# Patient Record
Sex: Female | Born: 1960
Health system: Southern US, Community
[De-identification: ages and names within clinical notes are randomized; demographics above are authoritative.]

## PROBLEM LIST (undated history)

## (undated) DIAGNOSIS — R12 Heartburn: Secondary | ICD-10-CM

## (undated) DIAGNOSIS — G43909 Migraine, unspecified, not intractable, without status migrainosus: Secondary | ICD-10-CM

## (undated) DIAGNOSIS — K8020 Calculus of gallbladder without cholecystitis without obstruction: Secondary | ICD-10-CM

## (undated) DIAGNOSIS — M7989 Other specified soft tissue disorders: Secondary | ICD-10-CM

## (undated) DIAGNOSIS — D649 Anemia, unspecified: Secondary | ICD-10-CM

## (undated) DIAGNOSIS — M1990 Unspecified osteoarthritis, unspecified site: Secondary | ICD-10-CM

## (undated) DIAGNOSIS — G4730 Sleep apnea, unspecified: Secondary | ICD-10-CM

## (undated) DIAGNOSIS — M25562 Pain in left knee: Secondary | ICD-10-CM

## (undated) DIAGNOSIS — M2550 Pain in unspecified joint: Secondary | ICD-10-CM

## (undated) DIAGNOSIS — J40 Bronchitis, not specified as acute or chronic: Secondary | ICD-10-CM

## (undated) DIAGNOSIS — R0602 Shortness of breath: Secondary | ICD-10-CM

## (undated) DIAGNOSIS — D219 Benign neoplasm of connective and other soft tissue, unspecified: Secondary | ICD-10-CM

## (undated) DIAGNOSIS — K760 Fatty (change of) liver, not elsewhere classified: Secondary | ICD-10-CM

## (undated) HISTORY — DX: Sleep apnea, unspecified: G47.30

## (undated) HISTORY — DX: Calculus of gallbladder without cholecystitis without obstruction: K80.20

## (undated) HISTORY — DX: Pain in unspecified joint: M25.50

## (undated) HISTORY — DX: Fatty (change of) liver, not elsewhere classified: K76.0

## (undated) HISTORY — PX: ABDOMINAL HYSTERECTOMY: SHX81

## (undated) HISTORY — DX: Unspecified osteoarthritis, unspecified site: M19.90

## (undated) HISTORY — PX: WISDOM TOOTH EXTRACTION: SHX21

## (undated) HISTORY — DX: Heartburn: R12

## (undated) HISTORY — DX: Other specified soft tissue disorders: M79.89

## (undated) HISTORY — DX: Pain in left knee: M25.562

## (undated) HISTORY — DX: Shortness of breath: R06.02

---

## 1993-09-09 HISTORY — PX: TUBAL LIGATION: SHX77

## 2006-01-22 ENCOUNTER — Ambulatory Visit: Payer: Self-pay | Admitting: Internal Medicine

## 2006-01-30 ENCOUNTER — Ambulatory Visit: Payer: Self-pay | Admitting: Internal Medicine

## 2006-06-14 ENCOUNTER — Ambulatory Visit: Payer: Self-pay | Admitting: Internal Medicine

## 2008-11-24 ENCOUNTER — Ambulatory Visit: Payer: Self-pay

## 2009-11-28 ENCOUNTER — Ambulatory Visit: Payer: Self-pay

## 2010-12-26 ENCOUNTER — Ambulatory Visit: Payer: Self-pay

## 2011-08-25 ENCOUNTER — Emergency Department (HOSPITAL_COMMUNITY)
Admission: EM | Admit: 2011-08-25 | Discharge: 2011-08-25 | Disposition: A | Discharge status: Home / Self Care | Payer: Managed Care, Other (non HMO) | Attending: Emergency Medicine | Admitting: Emergency Medicine

## 2011-08-25 ENCOUNTER — Emergency Department (HOSPITAL_COMMUNITY): Payer: Managed Care, Other (non HMO)

## 2011-08-25 DIAGNOSIS — R079 Chest pain, unspecified: Secondary | ICD-10-CM | POA: Insufficient documentation

## 2011-08-25 DIAGNOSIS — R0602 Shortness of breath: Secondary | ICD-10-CM | POA: Insufficient documentation

## 2011-08-25 DIAGNOSIS — R109 Unspecified abdominal pain: Secondary | ICD-10-CM | POA: Insufficient documentation

## 2011-08-25 DIAGNOSIS — K8020 Calculus of gallbladder without cholecystitis without obstruction: Secondary | ICD-10-CM | POA: Insufficient documentation

## 2011-08-25 LAB — CK TOTAL AND CKMB (NOT AT ARMC)
CK, MB: 2.6 ng/mL (ref 0.3–4.0)
Total CK: 138 U/L (ref 7–177)

## 2011-08-25 LAB — CBC
MCH: 30.4 pg (ref 26.0–34.0)
MCV: 88.8 fL (ref 78.0–100.0)
Platelets: 265 10*3/uL (ref 150–400)
RDW: 13 % (ref 11.5–15.5)

## 2011-08-25 LAB — COMPREHENSIVE METABOLIC PANEL
AST: 43 U/L — ABNORMAL HIGH (ref 0–37)
Albumin: 3.4 g/dL — ABNORMAL LOW (ref 3.5–5.2)
Calcium: 9.6 mg/dL (ref 8.4–10.5)
Creatinine, Ser: 0.57 mg/dL (ref 0.50–1.10)
GFR calc non Af Amer: >60 mL/min (ref >60)

## 2011-08-25 LAB — DIFFERENTIAL
Eosinophils Absolute: 0.1 10*3/uL (ref 0.0–0.7)
Eosinophils Relative: 2 % (ref 0–5)
Lymphs Abs: 1.7 10*3/uL (ref 0.7–4.0)
Monocytes Relative: 8 % (ref 3–12)

## 2011-08-25 LAB — TROPONIN I: Troponin I: <0.3 ng/mL (ref <0.30)

## 2011-08-30 ENCOUNTER — Ambulatory Visit (INDEPENDENT_AMBULATORY_CARE_PROVIDER_SITE_OTHER): Payer: Managed Care, Other (non HMO) | Admitting: Surgery

## 2011-09-06 ENCOUNTER — Emergency Department (HOSPITAL_COMMUNITY): Payer: Managed Care, Other (non HMO)

## 2011-09-06 ENCOUNTER — Emergency Department (HOSPITAL_COMMUNITY)
Admission: EM | Admit: 2011-09-06 | Discharge: 2011-09-07 | Disposition: A | Discharge status: Home / Self Care | Payer: Managed Care, Other (non HMO) | Attending: Emergency Medicine | Admitting: Emergency Medicine

## 2011-09-06 ENCOUNTER — Telehealth (INDEPENDENT_AMBULATORY_CARE_PROVIDER_SITE_OTHER): Payer: Self-pay

## 2011-09-06 DIAGNOSIS — R079 Chest pain, unspecified: Secondary | ICD-10-CM | POA: Insufficient documentation

## 2011-09-06 DIAGNOSIS — R112 Nausea with vomiting, unspecified: Secondary | ICD-10-CM | POA: Insufficient documentation

## 2011-09-06 DIAGNOSIS — R1011 Right upper quadrant pain: Secondary | ICD-10-CM | POA: Insufficient documentation

## 2011-09-06 DIAGNOSIS — K8020 Calculus of gallbladder without cholecystitis without obstruction: Secondary | ICD-10-CM | POA: Insufficient documentation

## 2011-09-06 LAB — POCT I-STAT TROPONIN I: Troponin i, poc: 0 ng/mL (ref 0.00–0.08)

## 2011-09-06 LAB — URINALYSIS, ROUTINE W REFLEX MICROSCOPIC
Nitrite: NEGATIVE
Specific Gravity, Urine: 1.021 (ref 1.005–1.030)
Urobilinogen, UA: 0.2 mg/dL (ref 0.0–1.0)
pH: 8 (ref 5.0–8.0)

## 2011-09-06 LAB — CBC
HCT: 36.7 % (ref 36.0–46.0)
MCH: 29.7 pg (ref 26.0–34.0)
MCHC: 34.3 g/dL (ref 30.0–36.0)
RDW: 12.4 % (ref 11.5–15.5)

## 2011-09-06 LAB — COMPREHENSIVE METABOLIC PANEL
Albumin: 3.9 g/dL (ref 3.5–5.2)
BUN: 12 mg/dL (ref 6–23)
Calcium: 10 mg/dL (ref 8.4–10.5)
Creatinine, Ser: 0.66 mg/dL (ref 0.50–1.10)
Potassium: 3.8 mEq/L (ref 3.5–5.1)
Total Protein: 7.5 g/dL (ref 6.0–8.3)

## 2011-09-06 LAB — LIPASE, BLOOD: Lipase: 20 U/L (ref 11–59)

## 2011-09-06 LAB — DIFFERENTIAL
Basophils Absolute: 0 10*3/uL (ref 0.0–0.1)
Eosinophils Relative: 1 % (ref 0–5)
Lymphocytes Relative: 29 % (ref 12–46)
Monocytes Absolute: 0.4 10*3/uL (ref 0.1–1.0)

## 2011-09-06 LAB — URINE MICROSCOPIC-ADD ON

## 2011-09-06 NOTE — Telephone Encounter (Signed)
Patient called and states she has an appointment 10/8 with Dr Derrell Lolling for her gallbladder.  She is now having chest pain.  She has some nausea, no shortness of breath, no abd pain, no vomiting and no fever.  She states it's the same pain she had before when she went to urgent care and then to the [facility] ER.  They gave her a thorough work up and ruled out anything cardiac and told her she has gallstones.  The pain today is not as bad.  She has not taken her pain med yet.  I advised she should go to the ER because it is probably another gb attack but just to be sure.  She will try the pain med and if no relief, go to ER.

## 2011-09-10 HISTORY — PX: CHOLECYSTECTOMY: SHX55

## 2011-09-11 ENCOUNTER — Encounter (INDEPENDENT_AMBULATORY_CARE_PROVIDER_SITE_OTHER): Payer: Self-pay | Admitting: General Surgery

## 2011-09-11 ENCOUNTER — Other Ambulatory Visit (INDEPENDENT_AMBULATORY_CARE_PROVIDER_SITE_OTHER): Payer: Self-pay | Admitting: General Surgery

## 2011-09-11 ENCOUNTER — Inpatient Hospital Stay (HOSPITAL_COMMUNITY): Payer: Managed Care, Other (non HMO)

## 2011-09-11 ENCOUNTER — Inpatient Hospital Stay (HOSPITAL_COMMUNITY)
Admission: AD | Admit: 2011-09-11 | Discharge: 2011-09-12 | DRG: 419 | Disposition: A | Discharge status: Home / Self Care | Payer: Managed Care, Other (non HMO) | Source: Ambulatory Visit | Attending: General Surgery | Admitting: General Surgery

## 2011-09-11 ENCOUNTER — Ambulatory Visit (INDEPENDENT_AMBULATORY_CARE_PROVIDER_SITE_OTHER): Payer: Managed Care, Other (non HMO) | Admitting: General Surgery

## 2011-09-11 VITALS — BP 98/68 | HR 120 | Temp 98.8°F | Resp 18 | Ht 65.5 in | Wt 165.4 lb

## 2011-09-11 DIAGNOSIS — M1710 Unilateral primary osteoarthritis, unspecified knee: Secondary | ICD-10-CM | POA: Diagnosis present

## 2011-09-11 DIAGNOSIS — K810 Acute cholecystitis: Secondary | ICD-10-CM

## 2011-09-11 DIAGNOSIS — K219 Gastro-esophageal reflux disease without esophagitis: Secondary | ICD-10-CM | POA: Diagnosis present

## 2011-09-11 DIAGNOSIS — Z880 Allergy status to penicillin: Secondary | ICD-10-CM

## 2011-09-11 DIAGNOSIS — K8000 Calculus of gallbladder with acute cholecystitis without obstruction: Secondary | ICD-10-CM

## 2011-09-11 DIAGNOSIS — Z87891 Personal history of nicotine dependence: Secondary | ICD-10-CM

## 2011-09-11 LAB — COMPREHENSIVE METABOLIC PANEL
Albumin: 3.2 g/dL — ABNORMAL LOW (ref 3.5–5.2)
Alkaline Phosphatase: 193 U/L — ABNORMAL HIGH (ref 39–117)
BUN: 12 mg/dL (ref 6–23)
Chloride: 92 mEq/L — ABNORMAL LOW (ref 96–112)
Creatinine, Ser: 1.06 mg/dL (ref 0.50–1.10)
GFR calc Af Amer: 69 mL/min — ABNORMAL LOW (ref >90)
Glucose, Bld: 128 mg/dL — ABNORMAL HIGH (ref 70–99)
Total Bilirubin: 0.7 mg/dL (ref 0.3–1.2)
Total Protein: 8.2 g/dL (ref 6.0–8.3)

## 2011-09-11 LAB — CBC
HCT: 37.4 % (ref 36.0–46.0)
Hemoglobin: 12.9 g/dL (ref 12.0–15.0)
MCHC: 34.5 g/dL (ref 30.0–36.0)
MCV: 88.2 fL (ref 78.0–100.0)
RDW: 12.4 % (ref 11.5–15.5)

## 2011-09-11 NOTE — Progress Notes (Signed)
Chief Complaint  Patient presents with  . Other    seen in ER for gallstones; in a lot of pain     HPI Lusero Nordlund is a 50 y.o. female.  Referred by Dr. Earlie Lou  HPI This is a 50 year old female who about a month ago began developing some right upper quadrant and right back pain. She was evaluated urgent care at that point I was sent home with a diagnosis of gallstones this has also been associated with some chest pain. This has been associated with some nausea but no emesis. She was scheduled in office but had her appointment canceled previously was due to come and again next week. She presented back to the emergency room last week where she still had gallstones but her she will he was pain-free per the emergency room and so she went home and I had her followup sooner than next week. Today she comes in really with constant pain for the last 72 hours. She is having sweats. She has not really been at this point at all. Nothing she is doing is relieving his pain at this point. This is aggravated by movement and even bleeding now.  Past Medical History  Diagnosis Date  . Gallstones   . Arthritis     Past Surgical History  Procedure Date  . Abdominal hysterectomy     partial   . Tubal ligation   . Boil removed      Family History  Problem Relation Age of Onset  . Diabetes Father     coma     Social History History  Substance Use Topics  . Smoking status: Former Games developer  . Smokeless tobacco: Never Used  . Alcohol Use: Yes    Allergies  Allergen Reactions  . Penicillins     Hives     Current Outpatient Prescriptions  Medication Sig Dispense Refill  . HYDROcodone-acetaminophen (VICODIN) 5-500 MG per tablet       . ondansetron (ZOFRAN-ODT) 8 MG disintegrating tablet       . oxyCODONE-acetaminophen (PERCOCET) 5-325 MG per tablet       . promethazine (PHENERGAN) 25 MG tablet         Review of Systems Review of Systems  Constitutional: Positive for diaphoresis and  appetite change.  HENT: Negative.   Eyes: Negative.   Respiratory: Negative.   Cardiovascular: Negative.   Gastrointestinal: Positive for abdominal pain.  Genitourinary: Negative.   Musculoskeletal: Negative.   Neurological: Negative.   Hematological: Negative.   Psychiatric/Behavioral: Negative.     Blood pressure 98/68, pulse 120, temperature 98.8 F (37.1 C), resp. rate 18, height 5' 5.5" (1.664 m), weight 165 lb 6 oz (75.014 kg).  Physical Exam Physical Exam  Constitutional: She appears well-developed and well-nourished.  Eyes: No scleral icterus.  Neck: Neck supple.  Cardiovascular: Regular rhythm and normal heart sounds.  Tachycardia present.   Pulmonary/Chest: Effort normal and breath sounds normal. She has no wheezes. She has no rales.  Abdominal: Soft. Bowel sounds are normal. There is no hepatomegaly. There is tenderness in the right upper quadrant. There is positive Murphy's sign. No hernia.  Lymphadenopathy:    She has no cervical adenopathy.  Skin: She is diaphoretic.    Data Reviewed *RADIOLOGY REPORT*  Clinical Data: Right upper quadrant pain and history of gallstones  ABDOMINAL ULTRASOUND COMPLETE  Comparison: Abdominal ultrasound 08/25/2011  Findings:  Gallbladder: There is a 2.4 cm stone in the gallbladder neck. The  time of exam this stone  was immobile with changes in patient  position. There is a 4 mm echogenicity in the dependent portion of  the gallbladder that could be tiny poly or nonshadowing stone. The  gallbladder wall thickness is normal. The sonographic Murphy's  sign is negative.  Common Bile Duct: Measures 5.8 to 6.2 mm.  Liver: No focal mass lesion identified. Within normal limits in  parenchymal echogenicity.  IVC: Appears normal.  Pancreas: Although the pancreas is difficult to visualize in its  entirety, no focal pancreatic abnormality is identified.  Spleen: Within normal limits in size and echotexture.  Right kidney: Normal in size  and parenchymal echogenicity. No  evidence of mass or hydronephrosis.  Left kidney: Normal in size and parenchymal echogenicity. No  evidence of mass or hydronephrosis.  Abdominal Aorta: No aneurysm identified.  IMPRESSION:  1.Large gallstone in the gallbladder neck. The stone was immobile  during examination.  2. Negative for acute cholecystitis.  3. The common bile duct measures up to 6.2 mm, slightly prominent.  On the recent prior study it measured smaller (5 mm).  Original Report Authenticated By: Britta Mccreedy, M.D.   Assessment    Cholecystitis    Plan    She pretty clearly on her exam today has acute cholecystitis. She is tachycardic today. She is also having sweats. I think she needs to be admitted, placed on IV antibiotics, and and have a cholecystectomy this admission. We discussed laparoscopic possible open cholecystectomy and some of the risks associated with that. I am going to admit her to [facility] per her choice and I've discussed this with my partner Dr. Carolynne Edouard already. He will care for while she is in the hospital. We will plan on rechecking her labs prior to the operating room as well.       Ajax Schroll 09/11/2011, 10:55 AM

## 2011-09-11 NOTE — Patient Instructions (Signed)
Admit to hospital for acute cholecystitis

## 2011-09-12 LAB — COMPREHENSIVE METABOLIC PANEL
Alkaline Phosphatase: 150 U/L — ABNORMAL HIGH (ref 39–117)
BUN: 9 mg/dL (ref 6–23)
CO2: 29 mEq/L (ref 19–32)
Chloride: 100 mEq/L (ref 96–112)
GFR calc non Af Amer: >90 mL/min (ref >90)
Glucose, Bld: 170 mg/dL — ABNORMAL HIGH (ref 70–99)
Potassium: 3.9 mEq/L (ref 3.5–5.1)
Total Bilirubin: 0.5 mg/dL (ref 0.3–1.2)

## 2011-09-12 LAB — CBC
HCT: 31.7 % — ABNORMAL LOW (ref 36.0–46.0)
Hemoglobin: 10.5 g/dL — ABNORMAL LOW (ref 12.0–15.0)
MCV: 88.8 fL (ref 78.0–100.0)
Platelets: 251 10*3/uL (ref 150–400)
RBC: 3.57 MIL/uL — ABNORMAL LOW (ref 3.87–5.11)
WBC: 11.4 10*3/uL — ABNORMAL HIGH (ref 4.0–10.5)

## 2011-09-12 NOTE — H&P (Signed)
[name], WESTRA NO.:  [id number]  MEDICAL RECORD NO.:  [id number]  LOCATION:  1524                         FACILITY:  [facility]  PHYSICIAN:  Ollen Gross. Vernell Morgans, M.D. DATE OF BIRTH:  May 22, 1961  DATE OF ADMISSION:  09/11/2011 DATE OF DISCHARGE:                             HISTORY & PHYSICAL   PRIMARY CARE:  Dr. Romana Juniper.  REASON FOR ADMISSION:  Gallstones with chest, abdominal, and right upper quadrant pain.  BRIEF HISTORY:  The patient is a 50 year old African-American female who has been relatively in good health, developed pain, and was seen by an Urgent Care on August 25, 2011.  They transferred her to the emergency room at [facility], where workup showed her to have gallstone along with some sludge.  Ultrasound showed a 2.4-cm stone and a small amount of sludge within the gallbladder.  Gallbladder was minimally thickened at 4 mm.  There was no pericholecystic fluid and there was no definite Murphy sign.  Common bile duct was 5 mm.  The other structures appeared normal.  Labs at that time were also obtained.  Her white count was 4.7, hemoglobin 11.7, hematocrit was 34.  CMP showed normal renal function, creatinine of 0.57.  Electrolytes were normal except for the potassium of 3.4.   Bilirubin was 0.3; alkaline phosphatase 97; SGOT was 43; SGPT was 39, slightly elevated.  Lipase on 15th was 23.  They also did a CK and troponin which were negative.  The patient returned to the ER on September 27 again and troponin was negative.  White count was 6.6, hemoglobin 12.6, hematocrit 36.7.  There was no left shift.  Creatinine was 0.66, potassium was 3.8, total bilirubin 0.2, alkaline phosphatase 85, SGOT was 22, SGPT was 17.  Lipase was 20.  Urinalysis was essentially negative.  There were 0 to 2 white cells per high-powered field.  Troponin was also obtained and this was 0.01, negative again.  I do not see any EKG.  Chest x-ray on her initial ER visit was  normal. She is continued to have pain.  She was initially scheduled to see someone in our office on September 24 and that had to be cancelled.  She had a second appointment scheduled for September 17, 2011, with Dr. Derrell Lolling, but she was bumped up today because she is having continuous pain, continuous nausea and vomiting.  She will get up, have something to eat, take a pain pill then go back to bed.  She was seen in the office by Dr. Dwain Sarna who subsequently admitted her directly to the hospital for evaluation and treatment by Dr. Carolynne Edouard.  PAST MEDICAL HISTORY: 1. Gallstones. 2. Arthritis. 3. History of migraines, none for a year. 4. History of bronchitis, none for 2 years. 5. GERD. 6. She has arthritis in both knees.  She needs a right knee     replacement at this time.  PAST SURGICAL HISTORY: 1. Hysterectomy. 2. Tubal ligation. 3. She had a perirectal abscess.  FAMILY HISTORY:  Mother is living, has diabetes and peripheral vascular occlusive disease.  Father died with diabetes.  Brother is deceased with renal failure.  There is one living who also has  diabetes.  SOCIAL HISTORY:  She smoked for about 23 years on and off, she quit 2 years ago, usually 1/2  a pack a day.  Alcohol social, none for 2 years. Drugs none.  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  REVIEW OF SYSTEMS:  She has not had any fevers.  She did have chills and sweats today.  She is having ongoing pain, especially after eating. Positive for nausea and vomiting.  Positive for GERD.  No diarrhea or constipation, although she reports no BM for 2 to 3 days.  CARDIAC: Negative for chest pain or palpitations.  She does have chest discomfort when she breathes or moves, everything hurts.  GU: No trouble voiding. LOWER EXTREMITIES:  No edema.  No claudication.  She has chronic knee pain.  SKIN:  No changes.  PSYCH:  No changes.  Appetite has been diminished, she has not been  eating much for the last 2 weeks secondary to her discomfort, nausea, and vomiting.  CURRENT MEDICATIONS:  Include; 1. Hydrocodone. 2. Percocet. 3. Zofran. 4. Phenergan.  ALLERGIES:  PENICILLIN causes rash.  PHYSICAL EXAMINATION:  GENERAL:  This is a well-nourished, overweight African-American female in no acute distress. VITAL SIGNS:  Temperature is 98.9, heart rate is 110, blood pressure is 114/79, respiratory rate is 20, sats are 100% on room air. HEAD:  Normocephalic. EYES, EARS, NOSE, AND THROAT:  Within normal limits.  There are no icteric changes in the eyes. NECK:  Trachea is in the midline.  There are no bruits.  No JVD.  No thyromegaly. RESPIRATORY:  Effort is normal. CHEST:  Clear to auscultation and percussion. CARDIAC:  Normal S1 and S2.  No murmurs or rubs were heard.  She does note that it hurts to breath in deeply. ABDOMEN:  Bowel sounds are present and normal.  Abdomen is not distended.  She is tender.  She points from the midepigastric area below the xiphoid to the right upper quadrant and into her back.  There are no masses, hernia, or abscesses noted. GU/RECTAL:  Deferred. LYMPH:  Lymphadenopathy, none palpated at cervical or femoral areas. MUSCULOSKELETAL:  No changes noted. SKIN:  No changes noted. NEUROLOGIC: No focal deficits.  Cranial nerves II-XII grossly intact. PSYCH:  Normal affect.  LABORATORY DATA:  Labs from September 07, 2011, are listed above. Ultrasound on September 15 shows a 2.4-cm stone with sludge in the gallbladder.  Common bile duct was 5 mm.  The gallbladder wall was 4 mm with no pericholecystic fluid and no Murphy sign.  Repeat ultrasound on September 27 shows 2.4 cm stone, is unchanged from her last report. There is also a 4-mm stone/sludge in the dependent portion of the gallbladder.  No Murphy sign.  The common bile duct is increased between 5.8 to 6.2 mm.  Chest x-ray on August 25, 2011, was normal.  IMPRESSION: 1.  Cholecystitis. 2. History of arthritis.  She needs bilateral knee replacements. 3. History of migraines, none for 1 year. 4. History of tobacco use, none for 2 years. 5. History of gastroesophageal reflux disease.  PLAN:  The patient has been seen by Dr. Carolynne Edouard.  She has antibiotics ordered, we will start her on those.  She had some yogurt at 7 a.m., some sips of water around 9 a.m.  We are going to wait around 2:30 or 3 and plan to do elective cholecystectomy today.  The risks and benefits were discussed with the patient and she was in  agreement.     Eber Hong, P.A.   ______________________________ Ollen Gross. Vernell Morgans, M.D.    WDJ/MEDQ  D:  09/11/2011  T:  09/11/2011  Job:  [id number]  cc:   Dr. Romana Juniper  Electronically Signed by Sherrie George P.A. on 09/11/2011 04:54:09 PM Electronically Signed by Chevis Pretty III M.D. on 09/12/2011 01:00:49 PM

## 2011-09-12 NOTE — Op Note (Signed)
NAMEPRESLEA, RHODUS NO.:  [id number]  MEDICAL RECORD NO.:  [id number]  LOCATION:  1524                         FACILITY:  [facility]  PHYSICIAN:  Ollen Gross. Vernell Morgans, M.D. DATE OF BIRTH:  10-13-1961  DATE OF PROCEDURE:  09/11/2011 DATE OF DISCHARGE:                              OPERATIVE REPORT   PREOPERATIVE DIAGNOSIS:  Cholecystitis with cholelithiasis.  POSTOPERATIVE DIAGNOSIS:  Cholecystitis with cholelithiasis.  PROCEDURE:  Laparoscopic cholecystectomy with intraoperative cholangiogram.  SURGEON:  Ollen Gross. Vernell Morgans, M.D.  ASSISTANT:  Sandria Bales. Ezzard Standing, M.D.  ANESTHESIA:  General endotracheal.  PROCEDURE:  After informed consent was obtained, the patient was brought to the operating room, placed in supine position on the operating room table.  After adequate induction of general anesthesia, patient's abdomen was prepped with ChloraPrep, allowed to dry and draped in usual sterile manner.  The area below the umbilicus was infiltrated with 0.25% Marcaine.  A small incision was made with a 15 blade knife.  This incision was carried down through the subcutaneous tissue bluntly with a hemostat and Army-Navy retractors until linea alba was identified.  The linea alba was incised with a 15 blade knife and each side was grasped with Kocher clamps and elevated anteriorly.  The preperitoneal space was then probed bluntly, hemostat to the peritoneum was opened, and access was gained to the abdominal cavity.  A 0 Vicryl pursestring stitch was placed in the fascia around the opening.  A Hasson cannula was placed through the opening and anchored in place the precise 5-0 Vicryl purse- string stitch.  The abdomen was then insufflated with carbon dioxide without difficulty.  The patient was placed in reverse Trendelenburg position and rotated with the right side up.  The epigastric region was then infiltrated with 0.25% Marcaine.  A small incision was made with a 15 blade  knife and a 10 mm port was placed bluntly through this incision into the abdominal cavity under direct vision.  Sites were then chosen laterally on the right side of the abdomen for placement of two 5 mm ports.  Each of these areas were infiltrated with 0.25% Marcaine.  Small stab incisions were made with a #15 blade knife.  5 mm ports were placed bluntly through these incisions into the abdominal cavity under direct vision.  The gallbladder was identified, it was very large, thick- walled, and inflamed.  We were able to bluntly dissect the omentum and duodenum away from the gallbladder.  In order to grasp the bladder, we placed Nezhat suction irrigator through one of the 5 mm ports and punctured the dome of the gallbladder and we were able to aspirate its contents.  We then placed a blunt grasper through the lateral-most 5 mm port and used to grasp the dome of the gallbladder and elevated anteriorly and superiorly.  Another blunt grasper was placed through the other 5 mm port and used to retract on the body and neck of the gallbladder.  A dissector was then placed through the epigastric port, and using blunt dissection, we were able to bluntly dissect at the neck of the gallbladder and we were able to readily identify the gallbladder neck cystic  duct junction and create a good window.  A single clip was placed on the gallbladder neck.  A small ductotomy was made just below the clip with laparoscopic scissors.  A 14-gauge Angiocath was placed percutaneously through the anterior abdominal wall under direct vision. A Reddick cholangiogram catheter was placed through the Angiocath and flushed.  The Reddick catheter was then placed within the cystic duct and anchored in place with a clip.  Cholangiogram was obtained that showed no filling defects, good emptying in duodenum, and adequate length on the cystic duct.  The anchoring clip and catheters were then removed from the patient.  Three  clips were placed proximally on the cystic duct and duct was divided between the 2 sets of clips.  Posterior to this, the cystic artery was identified and again dissected bluntly in a circumferential manner until a good window was created.  Two clips were placed proximally and distally on the artery and the artery was divided between the two.  A laparoscopic hook cautery device was used to separate the gallbladder from the liver bed prior to completely detaching the gallbladder from the liver bed.  The liver bed was inspected and several bleeding points were coagulated with electrocautery until the area was completely hemostatic.  The gallbladder was then detached away from the liver bed without difficulty.  A laparoscopic bag was inserted through the epigastric port.  Gallbladder was placed in the bag and the bag was sealed.  The abdomen was then irrigated with copious amounts of saline until the effluent was clear.  The laparoscope was removed through the epigastric port.  A gallbladder grasper was placed through the Hasson cannula and used to grasp to open the bag.  The bag with the gallbladder was removed with the Hasson cannula through the infraumbilical port.  We did have to extend this incision superiorly little bit to get the gallbladder out because of how thick it was.  We closed the fascia at the infraumbilical port site with a couple of 0 Vicryl figure-of-8 stitches as well as the previously placed pursestring stitch.  The rest of the ports were then removed under direct vision and were found to be hemostatic.  The gas was allowed to escape.  Skin incisions were all closed with interrupted 4-0 Monocryl subcuticular stitches and Dermabond dressings were applied. Patient tolerated the procedure well.  At the end of the case, all needle, sponge, instrument counts were correct.  Patient was then awakened and taken to the Recovery in stable condition.     Ollen Gross. Vernell Morgans,  M.D.     PST/MEDQ  D:  09/11/2011  T:  09/12/2011  Job:  [id number]  Electronically Signed by Chevis Pretty III M.D. on 09/12/2011 01:00:54 PM

## 2011-09-17 ENCOUNTER — Ambulatory Visit (INDEPENDENT_AMBULATORY_CARE_PROVIDER_SITE_OTHER): Payer: Managed Care, Other (non HMO) | Admitting: General Surgery

## 2011-09-28 NOTE — Discharge Summary (Signed)
[name], [name] NO.:  [id number]  MEDICAL RECORD NO.:  [id number]  LOCATION:  1524                         FACILITY:  [facility]  PHYSICIAN:  Dr. Hortense Ramal              P.A.DATE OF BIRTH:  1961/10/16  DATE OF ADMISSION:  09/11/2011 DATE OF DISCHARGE:                              DISCHARGE SUMMARY   DATE OF EXPECTED DISCHARGE:  September 12, 2011.  PRIMARY CARE DOCTOR:  Dr. Shela Nevin.  ADMISSION DIAGNOSIS:  Acute cholecystitis with cholelithiasis.  DISCHARGE DIAGNOSIS:  Acute cholecystitis with cholelithiasis.  ADDITIONAL DIAGNOSES: 1. History of migraines. 2. History of bronchitis. 3. History of tobacco use. 4. Gastroesophageal reflux disease. 5. Osteoarthritis both knees, currently needs a right knee     replacement.  PROCEDURE:  Laparoscopic cholecystectomy with intraoperative cholangiogram on September 11, 2011, Dr. Carolynne Edouard.  BRIEF HISTORY:  The patient is a 50 year old African American female who was seen in the ER after evaluation at an Urgent Care on August 25, 2011.  She was found to have gallstones at that time, was scheduled for followup appointment in our office on September 03, 2011.  That appointment was cancelled.  She returned to the ER again on September 06, 2011, and September 07, 2011, with pain and was rescheduled to be seen in our office on September 17, 2011.  The patient has had continuous problems with nausea and vomiting and pain for the last 2 weeks.  She was seen in the office today by Dr. Dwain Sarna who subsequently admitted her at that time for cholecystectomy.  It was his opinion she had acute cholecystitis for 2 weeks.  For further history and physical, please see the dictated note.  MEDICATIONS ON ADMISSION:  She has prescriptions for hydrocodone, Percocet, Zofran, and Phenergan from her prior evaluations.  No other home meds.  ALLERGIES:  PENICILLIN causes rash.  HOSPITAL COURSE:  The patient was admitted directly from  the office on September 11, 2011.  Labs obtained showed a white count of 15,600, hemoglobin is 12.9, and hematocrit was 37.  CMP showed normal electrolytes, creatinine of 1.06.  LFTs were slightly elevated.  Total bilirubin 0.7, alk phos 193.  Her SGOT was 47, SGPT was 190.  Lipase on admission was 17.  The patient was taken to the OR that afternoon by Dr. Carolynne Edouard, and underwent laparoscopic cholecystectomy.  She tolerated well and returned to the floor in good condition.  Following a.m., she is on clear liquids.  Her lipase is 13.  Her electrolytes are normal.  BUN is 9, creatinine is 0.64.  Total bilirubin is 0.5, alk phos is 150, SGOT is 45, SGPT is 135.  Her white count is down to 11.4, hemoglobin 10.5, and hematocrit 31.7.  The patient feels much better.  We are going to advance her diet, mobilize her, make sure she does well on oral pain medicines, and if she continues to do well, plan to discharge her later in the afternoon on September 12, 2011.  We will have her followup in 2-3 weeks with Dr. Carolynne Edouard.  She is to maintain a low-fat diet.  She works at the  [employer] and does fairly physical work with [employer], worked for at least 2 weeks and she is not safe to drive or use any equipment while she is taking the oxycodone.  She is to clean her wounds with plain soap and water.  She has no sutures or Steri- Strips to remove.  DISCHARGE MEDICATIONS: 1. Tylenol 650 mg p.o. q.4 h. p.r.n. for pain or ibuprofen 200 mg 2-3     tablets q.6 p.r.n. for pain. 2. Finally, she may have oxycodone/APAP 5/325 one to two q.4 p.r.n.     for pain, not relieved by the above meds.  Phenergan, Zofran, and hydrocodone are discontinued.  She is to call for any problems including fever of 101 or greater, trouble voiding.  Continued bleeding from the incision, increased pain, redness, or drainage from the incision or increased abdominal pain.  She is also instructed to follow up with her  primary care in 3-4 weeks.  She had an elevated glucose this morning of 170, but she is on IV fluids. She has not had a problem in the past with elevated glucoses.  We will let her follow up with her primary.     Eber Hong, P.A.     WDJ/MEDQ  D:  09/12/2011  T:  09/12/2011  Job:  [id number]  cc:   Dr. Shela Nevin  Electronically Signed by Sherrie George P.A. on 09/19/2011 09:55:30 PM Electronically Signed by Chevis Pretty III M.D. on 09/28/2011 09:03:39 AM

## 2011-10-02 ENCOUNTER — Ambulatory Visit (INDEPENDENT_AMBULATORY_CARE_PROVIDER_SITE_OTHER): Payer: Managed Care, Other (non HMO) | Admitting: General Surgery

## 2011-10-02 ENCOUNTER — Encounter (INDEPENDENT_AMBULATORY_CARE_PROVIDER_SITE_OTHER): Payer: Self-pay | Admitting: General Surgery

## 2011-10-02 VITALS — BP 132/84 | HR 80 | Temp 98.4°F | Resp 12 | Ht 65.0 in | Wt 185.2 lb

## 2011-10-02 DIAGNOSIS — K810 Acute cholecystitis: Secondary | ICD-10-CM

## 2011-10-02 NOTE — Progress Notes (Signed)
Subjective:     Patient ID: [name], female   DOB: [birth year], 50 y.o.   MRN: [id number]  HPI The patient is a 50 year old white female who is now about 3 weeks out from a laparoscopic cholecystectomy. Her main complaint is of sensitivity at her incisions. She denies any fevers.  she denies any nausea or vomiting. Her appetite has been good and her bowels are working normally.  Review of Systems     Objective:   Physical Exam On exam her abdomen is soft and nontender. She is very sensitive at each of her incisions. There is no redness to suggest infection. Her incisions look like they're healing very nicely.    Assessment:     3 weeks status post laparoscopic cholecystectomy    Plan:     At this point I think she ought to refrain from lifting anything heavy for about another 3 weeks. We will plan to see her back at the end of that time to clear her for full activity. I recommended some massage of the incisions to try to desensitize them

## 2011-10-02 NOTE — Patient Instructions (Signed)
No heavy lifting Massage incisions to help desensitize

## 2011-11-05 ENCOUNTER — Ambulatory Visit (INDEPENDENT_AMBULATORY_CARE_PROVIDER_SITE_OTHER): Payer: Managed Care, Other (non HMO) | Admitting: General Surgery

## 2011-11-05 ENCOUNTER — Encounter (INDEPENDENT_AMBULATORY_CARE_PROVIDER_SITE_OTHER): Payer: Self-pay | Admitting: General Surgery

## 2011-11-05 VITALS — BP 122/88 | HR 76 | Temp 97.0°F | Resp 12 | Ht 65.0 in | Wt 190.2 lb

## 2011-11-05 DIAGNOSIS — K810 Acute cholecystitis: Secondary | ICD-10-CM

## 2011-11-05 NOTE — Progress Notes (Signed)
Subjective:     Patient ID: [name], female   DOB: November 30, 1961, 50 y.o.   MRN: [id number]  HPI The patient is a 50 year old black female who is now about 6 weeks out from a laparoscopic cholecystectomy. She is feeling much better. She has no complaints today. Her appetite is good and her bowels are working fairly normally. She occasionally has a loose bowel movement.  Review of Systems     Objective:   Physical Exam On exam her abdomen is soft and nontender. Her incisions are all healed up nicely. She has minimal discomfort mostly around the umbilicus. No palpable evidence for hernia.    Assessment:     6 weeks status post laparoscopic cholecystectomy    Plan:     At this point I believe she can return to her normal activities without any restrictions. We will plan to see her back on a p.r.n. basis.

## 2011-11-05 NOTE — Patient Instructions (Signed)
May return to all normal activities 

## 2012-04-24 ENCOUNTER — Ambulatory Visit: Payer: Managed Care, Other (non HMO)

## 2012-04-24 ENCOUNTER — Telehealth: Payer: Self-pay

## 2012-04-24 ENCOUNTER — Other Ambulatory Visit: Payer: Self-pay | Admitting: Family Medicine

## 2012-04-24 ENCOUNTER — Ambulatory Visit (INDEPENDENT_AMBULATORY_CARE_PROVIDER_SITE_OTHER): Payer: Managed Care, Other (non HMO) | Admitting: Family Medicine

## 2012-04-24 VITALS — BP 119/79 | HR 64 | Temp 98.0°F | Resp 18 | Ht 65.0 in | Wt 189.6 lb

## 2012-04-24 DIAGNOSIS — M25569 Pain in unspecified knee: Secondary | ICD-10-CM

## 2012-04-24 DIAGNOSIS — M1711 Unilateral primary osteoarthritis, right knee: Secondary | ICD-10-CM

## 2012-04-24 DIAGNOSIS — M25561 Pain in right knee: Secondary | ICD-10-CM

## 2012-04-24 MED ORDER — OXAPROZIN 600 MG PO TABS: 600.0000 mg | ORAL_TABLET | Freq: Two times a day (BID) | ORAL | Status: DC

## 2012-04-24 MED ORDER — HYDROCODONE-ACETAMINOPHEN 5-500 MG PO TABS: 1.0000 | ORAL_TABLET | Freq: Three times a day (TID) | ORAL | Status: DC | PRN

## 2012-04-24 NOTE — Progress Notes (Signed)
Subjective: 51 year old lady with a history of a bad right knee. She is at was told last summer that she needed to have a total knee done, but the orthopedist preferred to try and wait another 4 years associated her mid 38s before getting this done. She has been wearing her knee brace. She twisted her knee yesterday despite having the brace on. No major injury just twisted. She pops all the time and that knee. Last night it swelled up badly has been very painful. She went to work at 4 AM today and worked for a while but could not tolerate it.  Objective: Tightly swollen right knee with large effusion palpable. She has diffuse tenderness around the knee joint. Only minimally warm. Pain with any movement. She has her knee brace with her, which is what she coming in.  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 [facility] reading (PRIMARY) by  Dr. Alwyn Ren Medial narrowing and popliteal calcifications c/w djd .  Knee was aspirated using sterile technique. Bloody fluid was obtained, 30 mL function.  Due to discomfort did not draw off more.

## 2012-04-24 NOTE — Patient Instructions (Addendum)
Patient is to followup with Dr. Ranell Patrick in the near future.  Use crutches. Stay off the knee as much as possible and keep it elevated with ice on it for the next few days. If you do not have an appointment yet with Dr. Devonne Doughty, come back here at Tuesday for a recheck

## 2012-04-24 NOTE — Telephone Encounter (Signed)
Pt at pharmacy waiting to get rx of oxizprozon and pt copay is $80 she can not pay that can Dr please call in something cheaper, pt is waiting

## 2012-04-25 LAB — SYNOVIAL CELL COUNT + DIFF, W/ CRYSTALS
Eosinophils-Synovial: 0 % (ref 0–1)
Lymphocytes-Synovial Fld: 5 % (ref 0–20)
Monocyte/Macrophage: 6 % — ABNORMAL LOW (ref 50–90)
Neutrophil, Synovial: 89 % — ABNORMAL HIGH (ref 0–25)
WBC, Synovial: 5100 cu mm — ABNORMAL HIGH (ref 0–200)

## 2012-04-25 MED ORDER — NAPROXEN 500 MG PO TABS: 500.0000 mg | ORAL_TABLET | Freq: Two times a day (BID) | ORAL | Status: AC

## 2012-04-25 NOTE — Telephone Encounter (Signed)
Done

## 2012-04-28 LAB — BODY FLUID CULTURE: Gram Stain: NONE SEEN

## 2012-04-29 ENCOUNTER — Ambulatory Visit (INDEPENDENT_AMBULATORY_CARE_PROVIDER_SITE_OTHER): Payer: Managed Care, Other (non HMO) | Admitting: Family Medicine

## 2012-04-29 ENCOUNTER — Encounter: Payer: Self-pay | Admitting: Family Medicine

## 2012-04-29 VITALS — BP 139/87 | HR 64 | Temp 99.0°F | Resp 16 | Ht 65.0 in | Wt 193.0 lb

## 2012-04-29 DIAGNOSIS — M1711 Unilateral primary osteoarthritis, right knee: Secondary | ICD-10-CM | POA: Insufficient documentation

## 2012-04-29 DIAGNOSIS — M1990 Unspecified osteoarthritis, unspecified site: Secondary | ICD-10-CM

## 2012-04-29 DIAGNOSIS — M1710 Unilateral primary osteoarthritis, unspecified knee: Secondary | ICD-10-CM

## 2012-04-29 MED ORDER — HYDROCODONE-ACETAMINOPHEN 5-500 MG PO TABS: 1.0000 | ORAL_TABLET | Freq: Three times a day (TID) | ORAL | Status: AC | PRN

## 2012-04-29 MED ORDER — PREDNISONE 20 MG PO TABS: 40.0000 mg | ORAL_TABLET | Freq: Every day | ORAL | Status: AC

## 2012-04-29 NOTE — Progress Notes (Signed)
This is a 51 year old woman, patient of Dr. Clearance Coots and Dr. Ranell Patrick, who comes in with persistent right knee pain. She was seen a year ago and it was felt at that time she was going to need a total knee replacement. Her doctors have been trying to postpone this until she is 69 so that the new placement will last the rest of her life. She's had intra-articular steroid injections, Synvisc injections, and a variety of nonsteroidals none of which have provided relief. She continues use of crutches because the pain is significant.  Since last week, patient has had no reaccumulation of the significant effusion. He is reluctant to have any more injections in the knee because the last aspiration procedure is so painful.  Objective: Middle-age woman in no acute distress, using crutches  Examination of the right knee reveals diffuse tenderness primarily in the medial joint line and anterior joint lines. She's a small effusion. She is able to bend the knee from 0-90 without problem.  Review of the x-rays reveals marked joint space narrowing medially and ectopic calcifications posteriorly.  Assessment: Significant arthritis right knee with a coming reevaluated by Dr. Ranell Patrick next week.  Plan: Keep appointment with Dr. Ranell Patrick next week, hydrocodone for pain control. Prednisone 40 mg daily x5

## 2012-04-29 NOTE — Patient Instructions (Signed)
Follow up with Dr. Ranell Patrick

## 2012-06-10 ENCOUNTER — Encounter (HOSPITAL_COMMUNITY): Payer: Self-pay | Admitting: Pharmacy Technician

## 2012-06-17 ENCOUNTER — Encounter (HOSPITAL_COMMUNITY)
Admission: RE | Admit: 2012-06-17 | Discharge: 2012-06-17 | Disposition: A | Discharge status: Home / Self Care | Payer: Managed Care, Other (non HMO) | Source: Ambulatory Visit | Attending: Orthopedic Surgery | Admitting: Orthopedic Surgery

## 2012-06-17 ENCOUNTER — Encounter (HOSPITAL_COMMUNITY): Payer: Self-pay

## 2012-06-17 DIAGNOSIS — Z01812 Encounter for preprocedural laboratory examination: Secondary | ICD-10-CM | POA: Insufficient documentation

## 2012-06-17 DIAGNOSIS — Z01818 Encounter for other preprocedural examination: Secondary | ICD-10-CM | POA: Insufficient documentation

## 2012-06-17 HISTORY — DX: Anemia, unspecified: D64.9

## 2012-06-17 HISTORY — DX: Migraine, unspecified, not intractable, without status migrainosus: G43.909

## 2012-06-17 HISTORY — DX: Benign neoplasm of connective and other soft tissue, unspecified: D21.9

## 2012-06-17 HISTORY — DX: Bronchitis, not specified as acute or chronic: J40

## 2012-06-17 LAB — BASIC METABOLIC PANEL
CO2: 27 mEq/L (ref 19–32)
Calcium: 9.4 mg/dL (ref 8.4–10.5)
Chloride: 104 mEq/L (ref 96–112)
Creatinine, Ser: 0.8 mg/dL (ref 0.50–1.10)
Glucose, Bld: 101 mg/dL — ABNORMAL HIGH (ref 70–99)

## 2012-06-17 LAB — CBC
HCT: 34.9 % — ABNORMAL LOW (ref 36.0–46.0)
Hemoglobin: 11.7 g/dL — ABNORMAL LOW (ref 12.0–15.0)
MCHC: 33.5 g/dL (ref 30.0–36.0)
MCV: 89.3 fL (ref 78.0–100.0)
RDW: 12.9 % (ref 11.5–15.5)

## 2012-06-17 LAB — APTT: aPTT: 33 seconds (ref 24–37)

## 2012-06-17 LAB — PROTIME-INR
INR: 1.02 (ref 0.00–1.49)
Prothrombin Time: 13.6 seconds (ref 11.6–15.2)

## 2012-06-17 LAB — ABO/RH: ABO/RH(D): B POS

## 2012-06-17 NOTE — H&P (Signed)
GN:FAOZH Knee pain HPI: 51   Y/o    Female  With worsening right knee pain secondary to osteoarthritis. Patient has elected to have a total knee arthroplasty to decrease pain and increase function. YQM:VHQIONGEX, chronic pain, allergies Family History:diabetes hypertension Social:occasional etoh, former smoker, working full time Meds:norco, naproxyn Allergies: penicillin, ultram, aspirin ROS: Pain with ambulation Vitals:130, 78, 16  74 PE: Alert and appropriate 51 y/o female   In no acute distress Cranial 2-12 grossly intact Cervical spine full rom without pain Lungs: bilateral breath sounds with no wheeze rhonchi or rales Heart: regular rate and rhythm with no murmur Abd: nontender nondistended with active bowel sounds Ext: moderate pain with rom of bilateral knees with crepitus, antalgic gait, neurovascularly intact distally. No pedal edema Skin: no rashes X-rays: endstage osteoarthritis to right  knee A/P: endstage osteoarthritis to right  knee Plan for right total knee arthroplasty to decrease pain and increase function.

## 2012-06-17 NOTE — Progress Notes (Signed)
Pt denied having a stress test, cardiac cath, or sleep study. 

## 2012-06-17 NOTE — Pre-Procedure Instructions (Signed)
20 BEXLEY LAUBACH  06/17/2012   Your procedure is scheduled on:  Friday June 20, 2012.  Report to [facility] at 0530 AM.  Call this number if you have problems the morning of surgery: [phone]   Remember:   Do not eat food:After Midnight.    Take these medicines the morning of surgery with A SIP OF WATER: Hydrocodone (Vicodin) if needed for pain, Tramadol (Ultram) if needed for pain.   Do not wear jewelry, make-up or nail polish.  Do not wear lotions, powders, or perfumes.   Do not shave 48 hours prior to surgery.   Do not bring valuables to the hospital.  Contacts, dentures or bridgework may not be worn into surgery.  Leave suitcase in the car. After surgery it may be brought to your room.  For patients admitted to the hospital, checkout time is 11:00 AM the day of discharge.   Patients discharged the day of surgery will not be allowed to drive home.  Name and phone number of your driver:   Special Instructions: CHG Shower Use Special Wash: 1/2 bottle night before surgery and 1/2 bottle morning of surgery.   Please read over the following fact sheets that you were given: Pain Booklet, Coughing and Deep Breathing, Blood Transfusion Information, Total Joint Packet, MRSA Information and Surgical Site Infection Prevention

## 2012-07-25 ENCOUNTER — Encounter (HOSPITAL_COMMUNITY)
Admission: RE | Admit: 2012-07-25 | Discharge: 2012-07-25 | Disposition: A | Discharge status: Home / Self Care | Payer: Managed Care, Other (non HMO) | Source: Ambulatory Visit | Attending: Orthopedic Surgery | Admitting: Orthopedic Surgery

## 2012-07-25 ENCOUNTER — Encounter (HOSPITAL_COMMUNITY): Payer: Self-pay

## 2012-07-25 LAB — PROTIME-INR
INR: 0.97 (ref 0.00–1.49)
Prothrombin Time: 13.1 seconds (ref 11.6–15.2)

## 2012-07-25 LAB — TYPE AND SCREEN
ABO/RH(D): B POS
Antibody Screen: NEGATIVE

## 2012-07-25 LAB — CBC
Hemoglobin: 11.6 g/dL — ABNORMAL LOW (ref 12.0–15.0)
MCH: 30 pg (ref 26.0–34.0)
Platelets: 250 10*3/uL (ref 150–400)
RBC: 3.87 MIL/uL (ref 3.87–5.11)
WBC: 4.7 10*3/uL (ref 4.0–10.5)

## 2012-07-25 LAB — BASIC METABOLIC PANEL
CO2: 26 mEq/L (ref 19–32)
Calcium: 9.6 mg/dL (ref 8.4–10.5)
Chloride: 101 mEq/L (ref 96–112)
Glucose, Bld: 86 mg/dL (ref 70–99)
Sodium: 137 mEq/L (ref 135–145)

## 2012-07-25 LAB — SURGICAL PCR SCREEN: MRSA, PCR: NEGATIVE

## 2012-07-25 LAB — APTT: aPTT: 33 seconds (ref 24–37)

## 2012-07-25 NOTE — Pre-Procedure Instructions (Signed)
20 [name]  07/25/2012   Your procedure is scheduled on:  08-01-2012  Report to [facility] at 5:30 AM.  Call this number if you have problems the morning of surgery: [phone]   Remember:   Do not eat food or drink:After Midnight.      Take these medicines the morning of surgery with A SIP OF WATER: pain medication as needed   Do not wear jewelry, make-up or nail polish.  Do not wear lotions, powders, or perfumes. You may wear deodorant.  Do not shave 48 hours prior to surgery. Men may shave face and neck.  Do not bring valuables to the hospital.  Contacts, dentures or bridgework may not be worn into surgery.  Leave suitcase in the car. After surgery it may be brought to your room.  For patients admitted to the hospital, checkout time is 11:00 AM the day of discharge.  Marland Kitchen    Special Instructions: CHG Shower Use Special Wash: 1/2 bottle night before surgery and 1/2 bottle morning of surgery.     Please read over the following fact sheets that you were given: Pain Booklet, MRSA Information and Surgical Site Infection Prevention

## 2012-07-25 NOTE — Progress Notes (Signed)
Office called for orders. Dr. Ranell Patrick and his PA are off today.Will be unable to get orders until next week.

## 2012-07-28 NOTE — Progress Notes (Signed)
Spoke  WITH KRISTIN AT DR NORRIS'S OFFICE ABOUT NEEDING PREOP ORDERS.

## 2012-07-31 MED ORDER — VANCOMYCIN HCL IN DEXTROSE 1-5 GM/200ML-% IV SOLN
1000.0000 mg | INTRAVENOUS | Status: AC
  Administered 2012-08-01: 1000 mg via INTRAVENOUS
  Filled 2012-07-31: qty 200

## 2012-08-01 ENCOUNTER — Inpatient Hospital Stay (HOSPITAL_COMMUNITY): Payer: Managed Care, Other (non HMO)

## 2012-08-01 ENCOUNTER — Inpatient Hospital Stay (HOSPITAL_COMMUNITY)
Admission: RE | Admit: 2012-08-01 | Discharge: 2012-08-04 | DRG: 470 | Disposition: A | Discharge status: Home Health | Payer: Managed Care, Other (non HMO) | Source: Ambulatory Visit | Attending: Orthopedic Surgery | Admitting: Orthopedic Surgery

## 2012-08-01 ENCOUNTER — Ambulatory Visit (HOSPITAL_COMMUNITY): Payer: Managed Care, Other (non HMO) | Admitting: Certified Registered Nurse Anesthetist

## 2012-08-01 ENCOUNTER — Encounter (HOSPITAL_COMMUNITY): Payer: Self-pay | Admitting: Certified Registered Nurse Anesthetist

## 2012-08-01 ENCOUNTER — Encounter (HOSPITAL_COMMUNITY)
Admission: RE | Disposition: A | Discharge status: Home Health | Payer: Self-pay | Source: Ambulatory Visit | Attending: Orthopedic Surgery

## 2012-08-01 DIAGNOSIS — Z87891 Personal history of nicotine dependence: Secondary | ICD-10-CM

## 2012-08-01 DIAGNOSIS — M1711 Unilateral primary osteoarthritis, right knee: Secondary | ICD-10-CM

## 2012-08-01 DIAGNOSIS — Z01812 Encounter for preprocedural laboratory examination: Secondary | ICD-10-CM

## 2012-08-01 DIAGNOSIS — M1710 Unilateral primary osteoarthritis, unspecified knee: Principal | ICD-10-CM | POA: Diagnosis present

## 2012-08-01 DIAGNOSIS — IMO0002 Reserved for concepts with insufficient information to code with codable children: Principal | ICD-10-CM | POA: Diagnosis present

## 2012-08-01 HISTORY — PX: TOTAL KNEE ARTHROPLASTY: SHX125

## 2012-08-01 SURGERY — ARTHROPLASTY, KNEE, TOTAL
Anesthesia: Regional | Site: Knee | Laterality: Right | Wound class: Clean

## 2012-08-01 MED ORDER — METHOCARBAMOL 100 MG/ML IJ SOLN
500.0000 mg | Freq: Four times a day (QID) | INTRAMUSCULAR | Status: DC | PRN
  Filled 2012-08-01: qty 5

## 2012-08-01 MED ORDER — PROPOFOL 10 MG/ML IV EMUL
INTRAVENOUS | Status: DC | PRN
  Administered 2012-08-01: 130 mg via INTRAVENOUS

## 2012-08-01 MED ORDER — HYDROMORPHONE HCL PF 1 MG/ML IJ SOLN
INTRAMUSCULAR | Status: AC
  Filled 2012-08-01: qty 1

## 2012-08-01 MED ORDER — VITAMIN B-12 100 MCG PO TABS
100.0000 ug | ORAL_TABLET | Freq: Every day | ORAL | Status: DC
  Administered 2012-08-01 – 2012-08-04 (×4): 100 ug via ORAL
  Filled 2012-08-01 (×4): qty 1

## 2012-08-01 MED ORDER — VITAMIN E 180 MG (400 UNIT) PO CAPS
400.0000 [IU] | ORAL_CAPSULE | Freq: Every day | ORAL | Status: DC
  Administered 2012-08-01 – 2012-08-04 (×4): 400 [IU] via ORAL
  Filled 2012-08-01 (×4): qty 1

## 2012-08-01 MED ORDER — ACETAMINOPHEN 10 MG/ML IV SOLN: 1000.0000 mg | Freq: Once | INTRAVENOUS | Status: DC | PRN

## 2012-08-01 MED ORDER — DEXAMETHASONE SODIUM PHOSPHATE 4 MG/ML IJ SOLN
INTRAMUSCULAR | Status: DC | PRN
  Administered 2012-08-01: 4 mg via INTRAVENOUS

## 2012-08-01 MED ORDER — LIDOCAINE HCL (CARDIAC) 20 MG/ML IV SOLN
INTRAVENOUS | Status: DC | PRN
  Administered 2012-08-01: 40 mg via INTRAVENOUS

## 2012-08-01 MED ORDER — ACETAMINOPHEN 650 MG RE SUPP: 650.0000 mg | Freq: Four times a day (QID) | RECTAL | Status: DC | PRN

## 2012-08-01 MED ORDER — ONDANSETRON HCL 4 MG PO TABS
4.0000 mg | ORAL_TABLET | Freq: Four times a day (QID) | ORAL | Status: DC | PRN
  Filled 2012-08-01: qty 1

## 2012-08-01 MED ORDER — CELECOXIB 200 MG PO CAPS
200.0000 mg | ORAL_CAPSULE | Freq: Two times a day (BID) | ORAL | Status: DC
  Administered 2012-08-01 – 2012-08-04 (×7): 200 mg via ORAL
  Filled 2012-08-01 (×8): qty 1

## 2012-08-01 MED ORDER — ONDANSETRON HCL 4 MG/2ML IJ SOLN: 4.0000 mg | Freq: Once | INTRAMUSCULAR | Status: DC | PRN

## 2012-08-01 MED ORDER — FERROUS SULFATE 325 (65 FE) MG PO TABS
325.0000 mg | ORAL_TABLET | Freq: Three times a day (TID) | ORAL | Status: DC
  Administered 2012-08-01 – 2012-08-04 (×7): 325 mg via ORAL
  Filled 2012-08-01 (×11): qty 1

## 2012-08-01 MED ORDER — HYDROMORPHONE HCL PF 1 MG/ML IJ SOLN
1.0000 mg | INTRAMUSCULAR | Status: DC | PRN
  Administered 2012-08-01 – 2012-08-02 (×3): 1 mg via INTRAVENOUS
  Filled 2012-08-01 (×5): qty 1

## 2012-08-01 MED ORDER — BISACODYL 10 MG RE SUPP: 10.0000 mg | Freq: Every day | RECTAL | Status: DC | PRN

## 2012-08-01 MED ORDER — OXYCODONE HCL 5 MG PO TABS
5.0000 mg | ORAL_TABLET | ORAL | Status: DC | PRN
  Administered 2012-08-01 – 2012-08-03 (×13): 10 mg via ORAL
  Administered 2012-08-04: 5 mg via ORAL
  Administered 2012-08-04: 10 mg via ORAL
  Filled 2012-08-01 (×10): qty 2
  Filled 2012-08-01: qty 1
  Filled 2012-08-01 (×5): qty 2

## 2012-08-01 MED ORDER — METHOCARBAMOL 100 MG/ML IJ SOLN
500.0000 mg | INTRAVENOUS | Status: AC
  Administered 2012-08-01: 500 mg via INTRAVENOUS
  Filled 2012-08-01: qty 5

## 2012-08-01 MED ORDER — ACETAMINOPHEN 10 MG/ML IV SOLN
INTRAVENOUS | Status: DC | PRN
  Administered 2012-08-01: 1000 mg via INTRAVENOUS

## 2012-08-01 MED ORDER — ACETAMINOPHEN 325 MG PO TABS: 650.0000 mg | ORAL_TABLET | Freq: Four times a day (QID) | ORAL | Status: DC | PRN

## 2012-08-01 MED ORDER — WARFARIN - PHARMACIST DOSING INPATIENT: Freq: Every day | Status: DC

## 2012-08-01 MED ORDER — SODIUM CHLORIDE 0.9 % IV SOLN
INTRAVENOUS | Status: DC
  Administered 2012-08-02: 16:00:00 via INTRAVENOUS

## 2012-08-01 MED ORDER — MENTHOL 3 MG MT LOZG: 1.0000 | LOZENGE | OROMUCOSAL | Status: DC | PRN

## 2012-08-01 MED ORDER — PHENOL 1.4 % MT LIQD: 1.0000 | OROMUCOSAL | Status: DC | PRN

## 2012-08-01 MED ORDER — VANCOMYCIN HCL IN DEXTROSE 1-5 GM/200ML-% IV SOLN
INTRAVENOUS | Status: AC
  Filled 2012-08-01: qty 200

## 2012-08-01 MED ORDER — SODIUM CHLORIDE 0.9 % IR SOLN
Status: DC | PRN
  Administered 2012-08-01: 3000 mL

## 2012-08-01 MED ORDER — MIDAZOLAM HCL 5 MG/5ML IJ SOLN
INTRAMUSCULAR | Status: DC | PRN
  Administered 2012-08-01: 2 mg via INTRAVENOUS

## 2012-08-01 MED ORDER — WARFARIN VIDEO: Freq: Once | Status: DC

## 2012-08-01 MED ORDER — CHLORHEXIDINE GLUCONATE 4 % EX LIQD: 60.0000 mL | Freq: Once | CUTANEOUS | Status: DC

## 2012-08-01 MED ORDER — ACETAMINOPHEN 10 MG/ML IV SOLN
INTRAVENOUS | Status: AC
  Filled 2012-08-01: qty 100

## 2012-08-01 MED ORDER — METHOCARBAMOL 500 MG PO TABS
500.0000 mg | ORAL_TABLET | Freq: Four times a day (QID) | ORAL | Status: DC | PRN
  Administered 2012-08-02 – 2012-08-04 (×7): 500 mg via ORAL
  Filled 2012-08-01 (×9): qty 1

## 2012-08-01 MED ORDER — WARFARIN SODIUM 7.5 MG PO TABS
7.5000 mg | ORAL_TABLET | Freq: Once | ORAL | Status: AC
  Administered 2012-08-01: 7.5 mg via ORAL
  Filled 2012-08-01: qty 1

## 2012-08-01 MED ORDER — COUMADIN BOOK
Freq: Once | Status: AC
  Administered 2012-08-01: 13:00:00
  Filled 2012-08-01: qty 1

## 2012-08-01 MED ORDER — ONDANSETRON HCL 4 MG/2ML IJ SOLN
INTRAMUSCULAR | Status: DC | PRN
  Administered 2012-08-01: 4 mg via INTRAVENOUS

## 2012-08-01 MED ORDER — METOCLOPRAMIDE HCL 5 MG/ML IJ SOLN: 5.0000 mg | Freq: Three times a day (TID) | INTRAMUSCULAR | Status: DC | PRN

## 2012-08-01 MED ORDER — HYDROMORPHONE HCL PF 1 MG/ML IJ SOLN: 0.2500 mg | INTRAMUSCULAR | Status: DC | PRN

## 2012-08-01 MED ORDER — VITAMIN C 500 MG PO TABS
1000.0000 mg | ORAL_TABLET | Freq: Every day | ORAL | Status: DC
  Administered 2012-08-01 – 2012-08-04 (×4): 1000 mg via ORAL
  Filled 2012-08-01 (×4): qty 2

## 2012-08-01 MED ORDER — LACTATED RINGERS IV SOLN
INTRAVENOUS | Status: DC | PRN
  Administered 2012-08-01 (×2): via INTRAVENOUS

## 2012-08-01 MED ORDER — ONDANSETRON HCL 4 MG/2ML IJ SOLN
4.0000 mg | Freq: Four times a day (QID) | INTRAMUSCULAR | Status: DC | PRN
  Administered 2012-08-02: 4 mg via INTRAVENOUS
  Filled 2012-08-01: qty 2

## 2012-08-01 MED ORDER — VANCOMYCIN HCL IN DEXTROSE 1-5 GM/200ML-% IV SOLN
1000.0000 mg | Freq: Two times a day (BID) | INTRAVENOUS | Status: AC
  Administered 2012-08-01: 1000 mg via INTRAVENOUS
  Filled 2012-08-01: qty 200

## 2012-08-01 MED ORDER — HYDROMORPHONE HCL PF 1 MG/ML IJ SOLN
0.2500 mg | INTRAMUSCULAR | Status: DC | PRN
  Administered 2012-08-01 (×3): 0.5 mg via INTRAVENOUS

## 2012-08-01 MED ORDER — METOCLOPRAMIDE HCL 10 MG PO TABS: 5.0000 mg | ORAL_TABLET | Freq: Three times a day (TID) | ORAL | Status: DC | PRN

## 2012-08-01 MED ORDER — FENTANYL CITRATE 0.05 MG/ML IJ SOLN
INTRAMUSCULAR | Status: DC | PRN
  Administered 2012-08-01 (×2): 50 ug via INTRAVENOUS
  Administered 2012-08-01: 100 ug via INTRAVENOUS
  Administered 2012-08-01: 50 ug via INTRAVENOUS

## 2012-08-01 SURGICAL SUPPLY — 60 items
BANDAGE ELASTIC 4 VELCRO ST LF (GAUZE/BANDAGES/DRESSINGS) ×2 IMPLANT
BANDAGE ELASTIC 6 VELCRO ST LF (GAUZE/BANDAGES/DRESSINGS) ×1 IMPLANT
BANDAGE ESMARK 6X9 LF (GAUZE/BANDAGES/DRESSINGS) ×1 IMPLANT
BANDAGE GAUZE ELAST BULKY 4 IN (GAUZE/BANDAGES/DRESSINGS) ×3 IMPLANT
BLADE SAG 18X100X1.27 (BLADE) ×2 IMPLANT
BLADE SAW SGTL 13.0X1.19X90.0M (BLADE) ×2 IMPLANT
BNDG CMPR 9X6 STRL LF SNTH (GAUZE/BANDAGES/DRESSINGS) ×1
BNDG CMPR MED 10X6 ELC LF (GAUZE/BANDAGES/DRESSINGS) ×1
BNDG ELASTIC 6X10 VLCR STRL LF (GAUZE/BANDAGES/DRESSINGS) ×2 IMPLANT
BNDG ESMARK 6X9 LF (GAUZE/BANDAGES/DRESSINGS) ×2
BOWL SMART MIX CTS (DISPOSABLE) ×2 IMPLANT
CEMENT HV SMART SET (Cement) ×4 IMPLANT
CLOTH BEACON ORANGE TIMEOUT ST (SAFETY) ×2 IMPLANT
CLSR STERI-STRIP ANTIMIC 1/2X4 (GAUZE/BANDAGES/DRESSINGS) ×1 IMPLANT
COVER BACK TABLE 24X17X13 BIG (DRAPES) IMPLANT
COVER SURGICAL LIGHT HANDLE (MISCELLANEOUS) ×2 IMPLANT
CUFF TOURNIQUET SINGLE 34IN LL (TOURNIQUET CUFF) ×1 IMPLANT
CUFF TOURNIQUET SINGLE 44IN (TOURNIQUET CUFF) IMPLANT
DRAPE EXTREMITY T 121X128X90 (DRAPE) ×2 IMPLANT
DRAPE PROXIMA HALF (DRAPES) ×2 IMPLANT
DRAPE U-SHAPE 47X51 STRL (DRAPES) ×2 IMPLANT
DRSG ADAPTIC 3X8 NADH LF (GAUZE/BANDAGES/DRESSINGS) ×2 IMPLANT
DRSG PAD ABDOMINAL 8X10 ST (GAUZE/BANDAGES/DRESSINGS) ×3 IMPLANT
DURAPREP 26ML APPLICATOR (WOUND CARE) ×2 IMPLANT
ELECT CAUTERY BLADE 6.4 (BLADE) ×2 IMPLANT
ELECT REM PT RETURN 9FT ADLT (ELECTROSURGICAL) ×2
ELECTRODE REM PT RTRN 9FT ADLT (ELECTROSURGICAL) ×1 IMPLANT
FACESHIELD LNG OPTICON STERILE (SAFETY) ×6 IMPLANT
GLOVE BIOGEL PI ORTHO PRO 7.5 (GLOVE) ×1
GLOVE BIOGEL PI ORTHO PRO SZ8 (GLOVE) ×1
GLOVE ORTHO TXT STRL SZ7.5 (GLOVE) ×2 IMPLANT
GLOVE PI ORTHO PRO STRL 7.5 (GLOVE) ×1 IMPLANT
GLOVE PI ORTHO PRO STRL SZ8 (GLOVE) ×1 IMPLANT
GLOVE SURG ORTHO 8.5 STRL (GLOVE) ×2 IMPLANT
GOWN STRL NON-REIN LRG LVL3 (GOWN DISPOSABLE) ×2 IMPLANT
GOWN STRL REIN XL XLG (GOWN DISPOSABLE) ×4 IMPLANT
HANDPIECE INTERPULSE COAX TIP (DISPOSABLE) ×2
IMMOBILIZER KNEE 22 UNIV (SOFTGOODS) ×1 IMPLANT
KIT BASIN OR (CUSTOM PROCEDURE TRAY) ×2 IMPLANT
KIT MANIFOLD (MISCELLANEOUS) ×2 IMPLANT
KIT ROOM TURNOVER OR (KITS) ×2 IMPLANT
MANIFOLD NEPTUNE II (INSTRUMENTS) ×2 IMPLANT
NS IRRIG 1000ML POUR BTL (IV SOLUTION) ×2 IMPLANT
PACK TOTAL JOINT (CUSTOM PROCEDURE TRAY) ×2 IMPLANT
PAD ARMBOARD 7.5X6 YLW CONV (MISCELLANEOUS) ×4 IMPLANT
SET HNDPC FAN SPRY TIP SCT (DISPOSABLE) ×1 IMPLANT
SPONGE GAUZE 4X4 12PLY (GAUZE/BANDAGES/DRESSINGS) ×2 IMPLANT
STRIP CLOSURE SKIN 1/2X4 (GAUZE/BANDAGES/DRESSINGS) ×4 IMPLANT
SUCTION FRAZIER TIP 10 FR DISP (SUCTIONS) ×2 IMPLANT
SUT MNCRL AB 3-0 PS2 18 (SUTURE) ×2 IMPLANT
SUT VIC AB 0 CT1 27 (SUTURE) ×4
SUT VIC AB 0 CT1 27XBRD ANBCTR (SUTURE) ×2 IMPLANT
SUT VIC AB 1 CT1 27 (SUTURE) ×4
SUT VIC AB 1 CT1 27XBRD ANBCTR (SUTURE) ×2 IMPLANT
SUT VIC AB 2-0 CT1 27 (SUTURE) ×4
SUT VIC AB 2-0 CT1 TAPERPNT 27 (SUTURE) ×2 IMPLANT
TOWEL OR 17X24 6PK STRL BLUE (TOWEL DISPOSABLE) ×2 IMPLANT
TOWEL OR 17X26 10 PK STRL BLUE (TOWEL DISPOSABLE) ×2 IMPLANT
TRAY FOLEY CATH 14FR (SET/KITS/TRAYS/PACK) ×1 IMPLANT
WATER STERILE IRR 1000ML POUR (IV SOLUTION) ×8 IMPLANT

## 2012-08-01 NOTE — Interval H&P Note (Signed)
History and Physical Interval Note:  08/01/2012 7:40 AM  [name]  has presented today for surgery, with the diagnosis of right knee OA  The various methods of treatment have been discussed with the patient and family. After consideration of risks, benefits and other options for treatment, the patient has consented to  Procedure(s) (LRB): TOTAL KNEE ARTHROPLASTY (Right) as a surgical intervention .  The patient's history has been reviewed, patient examined, no change in status, stable for surgery.  I have reviewed the patient's chart and labs.  Questions were answered to the patient's satisfaction.     Artesha Wemhoff,STEVEN R

## 2012-08-01 NOTE — Transfer of Care (Signed)
Immediate Anesthesia Transfer of Care Note  Patient: [name]  Procedure(s) Performed: Procedure(s) (LRB): TOTAL KNEE ARTHROPLASTY (Right)  Patient Location: PACU  Anesthesia Type: General  Level of Consciousness: sedated  Airway & Oxygen Therapy: Patient Spontanous Breathing and Patient connected to nasal cannula oxygen  Post-op Assessment: Report given to PACU RN, Post -op Vital signs reviewed and stable and Patient moving all extremities  Post vital signs: Reviewed and stable  Complications: No apparent anesthesia complications

## 2012-08-01 NOTE — Brief Op Note (Signed)
08/01/2012  10:10 AM  PATIENT:  Virgina Evener  51 y.o. female  PRE-OPERATIVE DIAGNOSIS:  right knee OA, end staged  POST-OPERATIVE DIAGNOSIS:  right knee OA, end staged  PROCEDURE:  Procedure(s) (LRB): TOTAL KNEE ARTHROPLASTY (Right), Zerita Boers Sigma RP  SURGEON:  Surgeon(s) and Role:    * Verlee Rossetti, MD - Primary  PHYSICIAN ASSISTANT:   ASSISTANTS: Donnie Coffin. Dixon, PA-C   ANESTHESIA:   regional and general  EBL:  Total I/O In: 1400 [I.V.:1400] Out: 100 [Urine:100]  BLOOD ADMINISTERED:none  DRAINS: none   LOCAL MEDICATIONS USED:  NONE  SPECIMEN:  No Specimen  DISPOSITION OF SPECIMEN:  N/A  COUNTS:  YES  TOURNIQUET:  * Missing tourniquet times found for documented tourniquets in log:  [id number] *  DICTATION: .Other Dictation: Dictation Number [id number]  PLAN OF CARE: Admit to inpatient   PATIENT DISPOSITION:  PACU - hemodynamically stable.   Delay start of Pharmacological VTE agent (>24hrs) due to surgical blood loss or risk of bleeding: not applicable

## 2012-08-01 NOTE — Progress Notes (Signed)
Orthopedic Tech Progress Note Patient Details:  [name] [birth year] [id number] CPM applied to Right LE with appropriate settings, OHF with trapeze bar already on patient bed CPM Right Knee CPM Right Knee: On Right Knee Flexion (Degrees): 60  Right Knee Extension (Degrees): 0    [name] 08/01/2012, 11:38 AM

## 2012-08-01 NOTE — Progress Notes (Signed)
Utilization Review Completed.[name] T8/23/2013   

## 2012-08-01 NOTE — Progress Notes (Addendum)
ANTICOAGULATION CONSULT NOTE - Initial Consult  Pharmacy Consult for coumadin Indication: VTE prophylaxis  Allergies  Allergen Reactions  . Penicillins     Hives   . Tramadol Itching    Patient Measurements:   Heparin Dosing Weight:   Vital Signs: Temp: 97.8 F (36.6 C) (08/23 1145) Temp src: Oral (08/23 0621) BP: 123/63 mmHg (08/23 1145) Pulse Rate: 65  (08/23 1154)  Labs: No results found for this basename: HGB:2,HCT:3,PLT:3,APTT:3,LABPROT:3,INR:3,HEPARINUNFRC:3,CREATININE:3,CKTOTAL:3,CKMB:3,TROPONINI:3 in the last 72 hours  The CrCl is unknown because both a height and weight (above a minimum accepted value) are required for this calculation.   Medical History: Past Medical History  Diagnosis Date  . Gallstones   . Bronchitis     hx of  . Migraine     hx of  . Arthritis     bilateral knees  . Anemia   . Fibroid tumor     Had partial hysterectomy    Medications:  Scheduled:    . celecoxib  200 mg Oral Q12H  . ferrous sulfate  325 mg Oral TID PC  . HYDROmorphone      . HYDROmorphone      . methocarbamol (ROBAXIN) IV  500 mg Intravenous To PACU  . vancomycin  1,000 mg Intravenous 60 min Pre-Op  . vancomycin  1,000 mg Intravenous Q12H  . vitamin B-12  100 mcg Oral Daily  . vitamin C  1,000 mg Oral Daily  . vitamin E  400 Units Oral Daily  . DISCONTD: chlorhexidine  60 mL Topical Once  . DISCONTD: chlorhexidine  60 mL Topical Once   Infusions:    . sodium chloride      Assessment: 51 yo female s/p TKA will be started on coumadin therapy.  Coumadin score = 7.  INR on 08/16 was 0.97.      Goal of Therapy:  INR 2-3    Plan:  1) Coumadin 7.5 mg po x1 2) Daily PT/INR 3) Coumadin booklet and video  Coolidge Gossard, Tsz-Yin 08/01/2012,12:41 PM

## 2012-08-01 NOTE — Anesthesia Postprocedure Evaluation (Signed)
  Anesthesia Post-op Note  Patient: [name]  Procedure(s) Performed: Procedure(s) (LRB): TOTAL KNEE ARTHROPLASTY (Right)  Patient Location: PACU  Anesthesia Type: General and GA combined with regional for post-op pain  Level of Consciousness: awake, alert  and oriented  Airway and Oxygen Therapy: Patient Spontanous Breathing and Patient connected to nasal cannula oxygen  Post-op Pain: mild  Post-op Assessment: Post-op Vital signs reviewed and Patient's Cardiovascular Status Stable  Post-op Vital Signs: stable  Complications: No apparent anesthesia complications

## 2012-08-01 NOTE — Anesthesia Preprocedure Evaluation (Addendum)
Anesthesia Evaluation  Patient identified by MRN, date of birth, ID band Patient awake    Reviewed: Allergy & Precautions, H&P , NPO status , Patient's Chart, lab work & pertinent test results  History of Anesthesia Complications Negative for: history of anesthetic complications  Airway Mallampati: I TM Distance: >3 FB Neck ROM: Full    Dental  (+) Teeth Intact and Dental Advisory Given   Pulmonary neg pulmonary ROS, former smoker,          Cardiovascular Exercise Tolerance: Good     Neuro/Psych  Headaches,    GI/Hepatic negative GI ROS, Neg liver ROS,   Endo/Other  negative endocrine ROS  Renal/GU negative Renal ROS     Musculoskeletal  (+) Arthritis -,   Abdominal   Peds  Hematology   Anesthesia Other Findings   Reproductive/Obstetrics                          Anesthesia Physical Anesthesia Plan  ASA: II  Anesthesia Plan: General   Post-op Pain Management:    Induction:   Airway Management Planned:   Additional Equipment:   Intra-op Plan:   Post-operative Plan:   Informed Consent:   Plan Discussed with:   Anesthesia Plan Comments:         Anesthesia Quick Evaluation

## 2012-08-01 NOTE — Preoperative (Signed)
Beta Blockers   Reason not to administer Beta Blockers:Not Applicable 

## 2012-08-01 NOTE — Anesthesia Procedure Notes (Signed)
Anesthesia Regional Block:  Femoral nerve block  Pre-Anesthetic Checklist: ,, timeout performed, Correct Patient, Correct Site, Correct Laterality, Correct Procedure, Correct Position, site marked, Risks and benefits discussed,  Surgical consent,  Pre-op evaluation,  At surgeon's request and post-op pain management  Laterality: Right  Prep: chloraprep       Needles:  Injection technique: Single-shot  Needle Type: Echogenic Stimulator Needle     Needle Length:cm 9 cm Needle Gauge: 22 and 22 G    Additional Needles:  Procedures: nerve stimulator Femoral nerve block Narrative:  Start time: 08/01/2012 8:00 AM End time: 08/01/2012 8:10 AM  Performed by: Personally   Additional Notes: 30 cc 0.5% Marcaine with 1:200 Epi injected in 5 cc increments.  Femoral nerve block

## 2012-08-01 NOTE — Progress Notes (Signed)
Physical Therapy Evaluation Patient Details Name: [name] MRN: [id number] DOB: December 12, 1960 Today's Date: 08/01/2012 Time: 1650-1730 PT Time Calculation (min): 40 min  PT Assessment / Plan / Recommendation Clinical Impression  Pt is a 51 y/o female s/p R TKA.  Acute PT to follow pt to maximize functional mobility for discharge to home.      PT Assessment  Patient needs continued PT services    Follow Up Recommendations  Home health PT;Supervision - Intermittent    Barriers to Discharge None      Equipment Recommendations  Rolling walker with 5" wheels;3 in 1 bedside comode;Tub/shower bench    Recommendations for Other Services     Frequency 7X/week    Precautions / Restrictions Precautions Precautions: Knee Precaution Booklet Issued: No Required Braces or Orthoses: Knee Immobilizer - Right Knee Immobilizer - Right: Other (comment) (At night or as needed.) Restrictions Weight Bearing Restrictions: No Other Position/Activity Restrictions: No pillow under right knee   Pertinent Vitals/Pain Pt reporting pain at 4-5/10.  Pt premedicated by nursing.      Mobility  Bed Mobility Bed Mobility: Supine to Sit;Sitting - Scoot to [place] of Bed Transfers Transfers: Sit to Stand;Stand to Sit;Stand Pivot Transfers Sit to Stand: 4: Min assist;From bed;With upper extremity assist Stand to Sit: 4: Min assist;To chair/3-in-1;With upper extremity assist Stand Pivot Transfers: 4: Min assist Details for Transfer Assistance: Cues for technique including hand placement and positioning of R LE with KI.  Ambulation/Gait Ambulation/Gait Assistance: Not tested (comment) Wheelchair Mobility Wheelchair Mobility: No    Exercises     PT Diagnosis: Difficulty walking;Generalized weakness;Acute pain  PT Problem List: Decreased strength;Decreased range of motion;Decreased activity tolerance;Decreased knowledge of use of DME;Decreased knowledge of precautions;Pain;Obesity;Decreased mobility PT  Treatment Interventions: DME instruction;Gait training;Stair training;Functional mobility training;Therapeutic activities;Therapeutic exercise;Patient/family education   PT Goals Acute Rehab PT Goals PT Goal Formulation: With patient Time For Goal Achievement: 08/08/12 Potential to Achieve Goals: Good Pt will go Supine/Side to Sit: with supervision PT Goal: Supine/Side to Sit - Progress: Goal set today Pt will go Sit to Supine/Side: with supervision PT Goal: Sit to Supine/Side - Progress: Goal set today Pt will go Sit to Stand: with supervision PT Goal: Sit to Stand - Progress: Goal set today Pt will go Stand to Sit: with supervision PT Goal: Stand to Sit - Progress: Goal set today Pt will Transfer Bed to Chair/Chair to Bed: with supervision PT Transfer Goal: Bed to Chair/Chair to Bed - Progress: Goal set today Pt will Ambulate: 51 - 150 feet;with supervision;with rolling walker PT Goal: Ambulate - Progress: Goal set today Pt will Go Up / Down Stairs: 1-2 stairs;with supervision;with rolling walker PT Goal: Up/Down Stairs - Progress: Goal set today Pt will Perform Home Exercise Program: with supervision, verbal cues required/provided PT Goal: Perform Home Exercise Program - Progress: Goal set today  Visit Information  Last PT Received On: 08/01/12 Assistance Needed: +1    Subjective Data  Subjective: I need to have the other one done.  Patient Stated Goal: Eventually return to work in [employer].    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 difficulties Dominant Hand: Right    Cognition  Overall Cognitive Status: Appears within functional limits for tasks assessed/performed Arousal/Alertness: Awake/alert Orientation Level: Appears intact for tasks assessed Behavior During Session: [facility] for tasks performed    Extremity/Trunk Assessment Right Upper Extremity Assessment RUE ROM/Strength/Tone: Within functional levels;WFL for tasks assessed Left Upper Extremity Assessment LUE ROM/Strength/Tone: WFL for tasks assessed Right Lower Extremity Assessment RLE ROM/Strength/Tone: Deficits RLE ROM/Strength/Tone Deficits: Weakness and limied ROM secondary to surgery.  Left Lower Extremity Assessment LLE ROM/Strength/Tone: WFL for tasks assessed Trunk Assessment Trunk Assessment: Normal   Balance Balance Balance Assessed: No  End of Session PT - End of Session Equipment Utilized During Treatment: Gait belt;Right knee immobilizer Activity Tolerance: Patient tolerated treatment well Patient left: in chair;with call bell/phone within reach Nurse Communication: Mobility status CPM Right Knee CPM Right Knee: Off Additional Comments: CPM off at 5pm  GP     Tyronza Happe 08/01/2012, 5:34 PM Locklyn Henriquez L. Royalti Schauf DPT [phone]

## 2012-08-02 LAB — BASIC METABOLIC PANEL
CO2: 26 mEq/L (ref 19–32)
Chloride: 101 mEq/L (ref 96–112)
GFR calc Af Amer: >90 mL/min (ref >90)
Potassium: 4.3 mEq/L (ref 3.5–5.1)
Sodium: 136 mEq/L (ref 135–145)

## 2012-08-02 LAB — CBC
HCT: 31.4 % — ABNORMAL LOW (ref 36.0–46.0)
MCV: 90.2 fL (ref 78.0–100.0)
RBC: 3.48 MIL/uL — ABNORMAL LOW (ref 3.87–5.11)
WBC: 7.1 10*3/uL (ref 4.0–10.5)

## 2012-08-02 LAB — PROTIME-INR
INR: 1.19 (ref 0.00–1.49)
Prothrombin Time: 15.4 seconds — ABNORMAL HIGH (ref 11.6–15.2)

## 2012-08-02 MED ORDER — WARFARIN SODIUM 7.5 MG PO TABS
7.5000 mg | ORAL_TABLET | Freq: Once | ORAL | Status: AC
  Administered 2012-08-02: 7.5 mg via ORAL
  Filled 2012-08-02: qty 1

## 2012-08-02 NOTE — Progress Notes (Signed)
ANTICOAGULATION CONSULT NOTE - Follow Up Consult  Pharmacy Consult for warfarin Indication: VTE prophylaxis  Allergies  Allergen Reactions  . Penicillins     Hives   . Tramadol Itching    Patient Measurements: Height: 5\' 6"  (167.6 cm) Weight: 196 lb (88.905 kg) IBW/kg (Calculated) : 59.3    Vital Signs: Temp: 98.4 F (36.9 C) (08/24 0625) BP: 102/60 mmHg (08/24 0625) Pulse Rate: 73  (08/24 0625)  Labs:  Alvira Philips 08/02/12 0605  HGB 10.3*  HCT 31.4*  PLT 242  APTT --  LABPROT 15.4*  INR 1.19  HEPARINUNFRC --  CREATININE 0.73  CKTOTAL --  CKMB --  TROPONINI --    Estimated Creatinine Clearance: 92.3 ml/min (by C-G formula based on Cr of 0.73).   Medications:  Prescriptions prior to admission  Medication Sig Dispense Refill  . HYDROcodone-acetaminophen (NORCO/VICODIN) 5-325 MG per tablet Take 1 tablet by mouth every 6 (six) hours as needed. For pain      . naproxen (NAPROSYN) 500 MG tablet Take 1 tablet (500 mg total) by mouth 2 (two) times daily with a meal.  30 tablet  2  . vitamin B-12 (CYANOCOBALAMIN) 100 MCG tablet Take 100 mcg by mouth daily.      . vitamin C (ASCORBIC ACID) 500 MG tablet Take 1,000 mg by mouth daily.      . vitamin E 400 UNIT capsule Take 400 Units by mouth daily.      Marland Kitchen GLUCOSAMINE PO Take 1 tablet by mouth 2 (two) times daily.      Marland Kitchen OVER THE COUNTER MEDICATION Take 1 capsule by mouth daily. Takes a supplement OTC, Golden seal  & Desmond Dike        Assessment: 51 y/o female on coumadin for VTE prophylaxis s/p TKA on 8/23.  Coumadin score is 7. INR today is subtherapeutic at 1.19 after one 7.5 mg dose.    Goal of Therapy:  INR 2-3 Monitor platelets by anticoagulation protocol: Yes   Plan:  1. Coumadin 7.5 mg x1 po tonight. 2. Daily PT/INR.    Doris Cheadle, PharmD Clinical Pharmacist Pager: [phone] Phone: [phone] 08/02/2012 10:40 AM

## 2012-08-02 NOTE — Progress Notes (Signed)
Orthopedics Progress Note  Subjective: I am feeling fine. I got up yesterday and could put weight on it.  Objective:  Filed Vitals:   08/02/12 0800  BP:   Pulse:   Temp:   Resp: 18    General: Awake and alert  Musculoskeletal: R LE bandaged and in knee CPM. Moving feet well bilaterally. Neurovascularly intact  Lab Results  Component Value Date   WBC 7.1 08/02/2012   HGB 10.3* 08/02/2012   HCT 31.4* 08/02/2012   MCV 90.2 08/02/2012   PLT 242 08/02/2012       Component Value Date/Time   NA 136 08/02/2012 0605   K 4.3 08/02/2012 0605   CL 101 08/02/2012 0605   CO2 26 08/02/2012 0605   GLUCOSE 117* 08/02/2012 0605   BUN 13 08/02/2012 0605   CREATININE 0.73 08/02/2012 0605   CALCIUM 8.9 08/02/2012 0605   GFRNONAA >90 08/02/2012 0605   GFRAA >90 08/02/2012 0605    Lab Results  Component Value Date   INR 1.19 08/02/2012   INR 0.97 07/25/2012   INR 1.02 06/17/2012    Assessment/Plan: POD #1 s/p Procedure(s): TOTAL KNEE ARTHROPLASTY Stable orthopedically. Continue OOB, mobilization DVT prophylaxis Foley out Likely D/C Monday, bandage change tomorrow. thanks  [employer]. Ranell Patrick, MD 08/02/2012 8:45 AM

## 2012-08-02 NOTE — Progress Notes (Signed)
Physical Therapy Treatment Patient Details Name: [name] MRN: [id number] DOB: 1961/03/06 Today's Date: 08/02/2012 Time: 4098-1191 PT Time Calculation (min): 16 min  PT Assessment / Plan / Recommendation Comments on Treatment Session  Rx limited this PM due to pt vomitting.    Follow Up Recommendations  Home health PT    Barriers to Discharge        Equipment Recommendations  Rolling walker with 5" wheels;3 in 1 bedside comode;Tub/shower bench    Recommendations for Other Services    Frequency 7X/week   Plan Discharge plan remains appropriate;Frequency remains appropriate    Precautions / Restrictions Precautions Precautions: Knee Required Braces or Orthoses: Knee Immobilizer - Right Knee Immobilizer - Right: Other (comment) (at night or as needed) Restrictions Weight Bearing Restrictions: Yes RLE Weight Bearing: Weight bearing as tolerated   Pertinent Vitals/Pain 9/10    Mobility  Bed Mobility Bed Mobility: Sit to Supine Sit to Supine: 4: Min assist Details for Bed Mobility Assistance: assist with RLE Transfers Sit to Stand: 4: Min assist;With armrests;From chair/3-in-1 Stand to Sit: 4: Min assist;To bed;With upper extremity assist Stand Pivot Transfers: 4: Min assist (with RW) Details for Transfer Assistance: verbal cues for sequencing, safety Ambulation/Gait Ambulation/Gait Assistance: 4: Min guard Ambulation Distance (Feet): 5 Feet Assistive device: Rolling walker Ambulation/Gait Assistance Details: Gait distance limited by N&V.  Verbal cues for sequencing Gait Pattern: Step-to pattern;Antalgic    Exercises Total Joint Exercises Ankle Circles/Pumps: AROM;Both;10 reps Quad Sets: AROM;Right;10 reps Heel Slides: AAROM;Right;10 reps Hip ABduction/ADduction: AAROM;Right;10 reps   PT Diagnosis:    PT Problem List:   PT Treatment Interventions:     PT Goals Acute Rehab PT Goals PT Goal: Supine/Side to Sit - Progress: Progressing toward goal PT Goal:  Sit to Supine/Side - Progress: Progressing toward goal PT Goal: Sit to Stand - Progress: Progressing toward goal PT Goal: Stand to Sit - Progress: Progressing toward goal PT Transfer Goal: Bed to Chair/Chair to Bed - Progress: Progressing toward goal PT Goal: Ambulate - Progress: Progressing toward goal PT Goal: Up/Down Stairs - Progress: Progressing toward goal PT Goal: Perform Home Exercise Program - Progress: Progressing toward goal  Visit Information  Last PT Received On: 08/02/12 Assistance Needed: +1    Subjective Data  Subjective: Upon entering room, pt vomitting.  RN in room. Patient Stated Goal: home   Cognition  Overall Cognitive Status: Appears within functional limits for tasks assessed/performed Arousal/Alertness: Awake/alert Orientation Level: Appears intact for tasks assessed Behavior During Session: [facility] for tasks performed    Balance     End of Session PT - End of Session Equipment Utilized During Treatment: Gait belt;Right knee immobilizer Activity Tolerance: Treatment limited secondary to medical complications (Comment) (N&V) Patient left: in bed;with call bell/phone within reach;with family/visitor present;with nursing in room Nurse Communication: Mobility status CPM Right Knee CPM Right Knee: On Right Knee Flexion (Degrees): 60  Right Knee Extension (Degrees): -5    GP     Ilda Foil 08/02/2012, 4:34 PM  [name], PT  Office # [phone] Pager [phone]

## 2012-08-02 NOTE — Op Note (Signed)
[name], [name] NO.:  [id number]  MEDICAL RECORD NO.:  [id number]  LOCATION:  5N10C                        FACILITY:  [facility]  PHYSICIAN:  Almedia Balls. Ranell Patrick, M.D. DATE OF BIRTH:  03/05/1961  DATE OF PROCEDURE:  08/01/2012 DATE OF DISCHARGE:                              OPERATIVE REPORT   PREOPERATIVE DIAGNOSIS:  Right knee end-stage osteoarthritis.  POSTOPERATIVE DIAGNOSIS:  Right knee end-stage osteoarthritis.  PROCEDURE PERFORMED:  Right total knee arthroplasty using DePuy Sigma rotating platform prosthesis.  ATTENDING SURGEON:  Almedia Balls. Ranell Patrick, MD  ASSISTANT:  Donnie Coffin. Dixon, PA, who was scrubbed during the entire procedure and necessary for satisfactory completion of the surgery.  ANESTHESIA:  General anesthesia was used plus femoral block.  ESTIMATED BLOOD LOSS:  Minimal.  FLUID REPLACEMENT:  1500 mL of crystalloid.  COUNTS:  Instrument count was correct.  COMPLICATIONS:  There were no complications.  TOURNIQUET TIME:  1 hour and 30 minutes at 300 mmHg.  INDICATIONS:  The patient is a 51 year old female with worsening right knee pain secondary to severe osteoarthritis.  The patient has failed an extended period of conservative management including injections, anti- inflammatories, pain medications, and now requires use of a cane for ambulation.  She has giving way of her knee and instability and is unable to walk more than a block without having to sit down due to pain. The patient presents now for total knee arthroplasty, restore function and eliminate pain to her knee.  Informed consent was obtained and the patient also has a bone-on-bone standing x-rays.  DESCRIPTION OF PROCEDURE:  After an adequate level of anesthesia was achieved, the patient positioned in the supine position.  Right leg was correctly identified.  Nonsterile tourniquet was placed in the proximal thigh.  Right leg was sterilely prepped and draped in the usual  manner. Time-out was called.  We then went ahead and elevated and exsanguinated the knee using the Esmarch bandage.  We then flexed the knee and made our longitudinal midline incision with a 10-blade scalpel, dissection down through the subcutaneous tissues using a scalpel.  We then performed a medial parapatellar arthrotomy using a fresh 10-blade scalpel.  We everted the patella and divided the lateral patellofemoral ligaments.  We entered the distal femur with a step-cut drill.  We placed the intramedullary distal femoral resection guide, set on 5 degrees of right, 10 mm resection.  We went ahead and resected the distal femur with oscillating saw.  We then sized the femur anterior down using standard measuring guide for DePuy.  We sized it to a size 3. We then placed our size 3 block with the appropriate rotation and impacted that in position.  Next, we went ahead and cut using the 4-in-1 block, the anterior-posterior cuts as well as the chamfer cuts.  We then removed anterior cruciate and posterior crucial ligaments and remaining meniscal tissue.  We subluxed the tibia anteriorly and then cut the tibia 90 degrees perpendicular long axis with an external alignment jig, set on minimal posterior slope for the posterior cruciate substituting prosthesis.  We then placed our lamina spreader and resected extra posterior bone and capsule and then went ahead  and checked our gaps with the flexion-extension gap jig and had revealed symmetric gaps at 10 mm. Next, we went ahead and continued our tibial preparation with the modular drill and keel punch for final tibial preparation.  There was some pretty hard bone on the medial side of the tibia, which we drilled out just to encourage the incorporation of the cement and interdigitation.  Next, we went ahead and finished the femoral preparation with the box cut for the posterior cruciate substituting prosthesis on the femur and then this was for the  size 3.  Once that was done, we placed our trial tibia, trial femur, and reduced the knee.  We were happy with the alignment and the soft tissue balancing.  We then went ahead and resurfaced the patella started at 28 mm and resurfaced down to about 18 mm thickness and size 38 patellar button.  We drilled the lugs for the button and placed the patellar button in place and then ranged the knee fully with excellent patellar tracking with no-touch technique.  We then removed all trial components, pulse irrigated the knee to remove debris and then went ahead and cemented the components into place using DePuy high viscosity cement, mixed with Accu-Mixing and then we went ahead and reduced the knee and allowed the cement to dry. We used a patella to clamp on the patella.  Once the cement was allowed to dry, we removed excess cement using 0.25-inch curved osteotome.  We then went ahead and trialed again the 10 and 12.  We were able to get the 12.5 component in and we ahead and removed the trial 12.5 and then did insert the 12.5 polyethylene insert.  We then reduced the knee, we were happy with soft tissue alignment, balancing, patellar tracking and then went ahead and thoroughly irrigated the knee and closed using interrupted #1 Vicryl suture for the parapatellar arthrotomy followed by 2-0 and 0 Vicryl for layered subcutaneous closure and 4-0 Monocryl for skin.  Steri-Strips were applied followed by sterile dressing.  The patient tolerated the surgery well.     Almedia Balls. Ranell Patrick, M.D.     SRN/MEDQ  D:  08/01/2012  T:  08/02/2012  Job:  [id number]

## 2012-08-02 NOTE — Progress Notes (Signed)
OT Cancellation Note  Treatment cancelled today due to pt unable to participate due to nausea and vomitting at this time. Will re-attempt tomorrow.  08/02/2012 Cipriano Mile OTR/L Pager [phone] Office [phone]

## 2012-08-02 NOTE — Progress Notes (Signed)
Physical Therapy Treatment Patient Details Name: [name] MRN: [id number] DOB: 1961/02/05 Today's Date: 08/02/2012 Time: 4098-1191 PT Time Calculation (min): 11 min  PT Assessment / Plan / Recommendation Comments on Treatment Session  Pt declined mobility this AM due to just getting in recliner after ambulating to/from bathroom with nursing.    Follow Up Recommendations  Home health PT    Barriers to Discharge        Equipment Recommendations  Rolling walker with 5" wheels;3 in 1 bedside comode;Tub/shower bench    Recommendations for Other Services    Frequency 7X/week   Plan Discharge plan remains appropriate;Frequency remains appropriate    Precautions / Restrictions Precautions Precautions: Knee Required Braces or Orthoses: Knee Immobilizer - Right Knee Immobilizer - Right: Other (comment) (at night or as needed) Restrictions Weight Bearing Restrictions: Yes RLE Weight Bearing: Weight bearing as tolerated   Pertinent Vitals/Pain     Mobility       Exercises Total Joint Exercises Ankle Circles/Pumps: AROM;Both;10 reps Quad Sets: AROM;Right;10 reps Heel Slides: AAROM;Right;10 reps Hip ABduction/ADduction: AAROM;Right;10 reps   PT Diagnosis:    PT Problem List:   PT Treatment Interventions:     PT Goals Acute Rehab PT Goals PT Goal: Supine/Side to Sit - Progress: Progressing toward goal PT Goal: Sit to Supine/Side - Progress: Progressing toward goal PT Goal: Sit to Stand - Progress: Progressing toward goal PT Goal: Stand to Sit - Progress: Progressing toward goal PT Transfer Goal: Bed to Chair/Chair to Bed - Progress: Progressing toward goal PT Goal: Ambulate - Progress: Progressing toward goal PT Goal: Up/Down Stairs - Progress: Progressing toward goal PT Goal: Perform Home Exercise Program - Progress: Progressing toward goal  Visit Information  Last PT Received On: 08/02/12 Assistance Needed: +1    Subjective Data  Subjective: "I just got in the  chair." Patient Stated Goal: home   Cognition  Overall Cognitive Status: Appears within functional limits for tasks assessed/performed Arousal/Alertness: Awake/alert Orientation Level: Appears intact for tasks assessed Behavior During Session: [facility] for tasks performed    Balance     End of Session PT - End of Session Activity Tolerance: Patient tolerated treatment well Patient left: in chair;with call bell/phone within reach   GP     Ilda Foil 08/02/2012, 3:29 PM  [name], PT  Office # [phone] Pager [phone]

## 2012-08-03 LAB — CBC
MCH: 30.1 pg (ref 26.0–34.0)
MCHC: 33.3 g/dL (ref 30.0–36.0)
MCV: 90.2 fL (ref 78.0–100.0)
Platelets: 256 10*3/uL (ref 150–400)
RDW: 13 % (ref 11.5–15.5)

## 2012-08-03 LAB — PROTIME-INR: Prothrombin Time: 18.5 seconds — ABNORMAL HIGH (ref 11.6–15.2)

## 2012-08-03 MED ORDER — BISACODYL 5 MG PO TBEC
5.0000 mg | DELAYED_RELEASE_TABLET | Freq: Every day | ORAL | Status: DC | PRN
  Administered 2012-08-03: 5 mg via ORAL
  Filled 2012-08-03: qty 1

## 2012-08-03 MED ORDER — WARFARIN SODIUM 5 MG PO TABS
5.0000 mg | ORAL_TABLET | Freq: Once | ORAL | Status: AC
  Administered 2012-08-03: 5 mg via ORAL
  Filled 2012-08-03: qty 1

## 2012-08-03 NOTE — Progress Notes (Signed)
Orthopedics Progress Note  Subjective: I feel better today.  Objective:  Filed Vitals:   08/03/12 0617  BP: 108/63  Pulse: 94  Temp: 99.6 F (37.6 C)  Resp: 18    General: Awake and alert  Musculoskeletal: incision CDI, extending the knee very well, sitting up in chair, good quad sets No cords Neurovascularly intact  Lab Results  Component Value Date   WBC 8.1 08/03/2012   HGB 9.8* 08/03/2012   HCT 29.4* 08/03/2012   MCV 90.2 08/03/2012   PLT 256 08/03/2012       Component Value Date/Time   NA 136 08/02/2012 0605   K 4.3 08/02/2012 0605   CL 101 08/02/2012 0605   CO2 26 08/02/2012 0605   GLUCOSE 117* 08/02/2012 0605   BUN 13 08/02/2012 0605   CREATININE 0.73 08/02/2012 0605   CALCIUM 8.9 08/02/2012 0605   GFRNONAA >90 08/02/2012 0605   GFRAA >90 08/02/2012 0865    Lab Results  Component Value Date   INR 1.51* 08/03/2012   INR 1.19 08/02/2012   INR 0.97 07/25/2012    Assessment/Plan: POD #2 s/p Procedure(s): TOTAL KNEE ARTHROPLASTY Doing very well continue PT/OT mobilization Likely D/C tomorrow. Patient requests po dulcolax  Almedia Balls. Ranell Patrick, MD 08/03/2012 9:21 AM

## 2012-08-03 NOTE — Progress Notes (Signed)
ANTICOAGULATION CONSULT NOTE - Follow Up Consult  Pharmacy Consult for warfarin Indication: VTE prophylaxis  Allergies  Allergen Reactions  . Penicillins     Hives   . Tramadol Itching    Patient Measurements: Height: 5\' 6"  (167.6 cm) Weight: 196 lb (88.905 kg) IBW/kg (Calculated) : 59.3    Vital Signs: Temp: 99.6 F (37.6 C) (08/25 0617) BP: 108/63 mmHg (08/25 0617) Pulse Rate: 94  (08/25 0617)  Labs:  Basename 08/03/12 0520 08/02/12 0605  HGB 9.8* 10.3*  HCT 29.4* 31.4*  PLT 256 242  APTT -- --  LABPROT 18.5* 15.4*  INR 1.51* 1.19  HEPARINUNFRC -- --  CREATININE -- 0.73  CKTOTAL -- --  CKMB -- --  TROPONINI -- --    Estimated Creatinine Clearance: 92.3 ml/min (by C-G formula based on Cr of 0.73).   Medications:  Prescriptions prior to admission  Medication Sig Dispense Refill  . HYDROcodone-acetaminophen (NORCO/VICODIN) 5-325 MG per tablet Take 1 tablet by mouth every 6 (six) hours as needed. For pain      . naproxen (NAPROSYN) 500 MG tablet Take 1 tablet (500 mg total) by mouth 2 (two) times daily with a meal.  30 tablet  2  . vitamin B-12 (CYANOCOBALAMIN) 100 MCG tablet Take 100 mcg by mouth daily.      . vitamin C (ASCORBIC ACID) 500 MG tablet Take 1,000 mg by mouth daily.      . vitamin E 400 UNIT capsule Take 400 Units by mouth daily.      Marland Kitchen GLUCOSAMINE PO Take 1 tablet by mouth 2 (two) times daily.      Marland Kitchen OVER THE COUNTER MEDICATION Take 1 capsule by mouth daily. Takes a supplement OTC, Golden seal  & Desmond Dike        Assessment: 51 y/o female on coumadin for VTE prophylaxis s/p TKA on 8/23.  Coumadin score is 7. INR today is subtherapeutic but jumped to 1.51 today. No bleeding noted.  Goal of Therapy:  INR 2-3 Monitor platelets by anticoagulation protocol: Yes   Plan:  1. Coumadin 5 mg x1 po tonight. 2. Daily PT/INR.    Doris Cheadle, PharmD Clinical Pharmacist Pager: [phone] Phone: [phone] 08/03/2012 8:47 AM

## 2012-08-03 NOTE — Progress Notes (Signed)
Physical Therapy Treatment Patient Details Name: [name] MRN: [id number] DOB: 1961-11-27 Today's Date: 08/03/2012 Time: 5784-6962 PT Time Calculation (min): 33 min  PT Assessment / Plan / Recommendation Comments on Treatment Session  Great progress today with increased gait distance and stair education completed.    Follow Up Recommendations  Home health PT       Equipment Recommendations  Rolling walker with 5" wheels;3 in 1 bedside comode       Frequency 7X/week   Plan Discharge plan remains appropriate;Frequency remains appropriate    Precautions / Restrictions Precautions Precautions: Knee Required Braces or Orthoses: Knee Immobilizer - Right Knee Immobilizer - Right: Other (comment) (at night or as needed) Restrictions RLE Weight Bearing: Weight bearing as tolerated    Pertinent Vitals/Pain 2-3/10 right knee pain before and after session. Premedicated by RN.    Mobility  Transfers Transfers: Sit to Stand;Stand to Sit Sit to Stand: 5: Supervision;With upper extremity assist;From chair/3-in-1;With armrests Stand to Sit: 5: Supervision;To chair/3-in-1;With upper extremity assist;With armrests Ambulation/Gait Ambulation/Gait Assistance: 5: Supervision Ambulation Distance (Feet): 130 Feet Assistive device: Rolling walker Ambulation/Gait Assistance Details: min cues for sequence and walker position with gait intially, progressed to no cues needed. Gait Pattern: Step-through pattern;Decreased stride length;Decreased step length - left;Decreased stance time - right;Antalgic Gait velocity: Decreased gait speed Stairs: Yes Stairs Assistance: 4: Min guard Stair Management Technique: Forwards;With walker;Step to pattern Number of Stairs: 1     Exercises Total Joint Exercises Ankle Circles/Pumps: AROM;Both;10 reps;Supine Quad Sets: AROM;Strengthening;Right;10 reps;Supine Heel Slides: AAROM;Strengthening;Right;10 reps;Supine Hip ABduction/ADduction:  AAROM;Strengthening;Right;10 reps;Supine Straight Leg Raises: AAROM;Strengthening;Right;10 reps;Supine    PT Goals Acute Rehab PT Goals PT Goal: Sit to Stand - Progress: Met PT Goal: Stand to Sit - Progress: Met PT Transfer Goal: Bed to Chair/Chair to Bed - Progress: Met PT Goal: Ambulate - Progress: Met PT Goal: Up/Down Stairs - Progress: Progressing toward goal PT Goal: Perform Home Exercise Program - Progress: Progressing toward goal  Visit Information  Last PT Received On: 08/03/12 Assistance Needed: +1    Subjective Data  Subjective: No new complaints, feeling better today. Agreeable to therapy today.   Cognition  Overall Cognitive Status: Appears within functional limits for tasks assessed/performed Arousal/Alertness: Awake/alert Orientation Level: Appears intact for tasks assessed Behavior During Session: [facility] for tasks performed       End of Session PT - End of Session Equipment Utilized During Treatment: Gait belt;Right knee immobilizer Activity Tolerance: Patient tolerated treatment well Patient left: in chair;with call bell/phone within reach;with family/visitor present Nurse Communication: Mobility status;Weight bearing status   GP     Sallyanne Kuster 08/03/2012, 3:29 PM  Sallyanne Kuster, PTA Office- [phone]

## 2012-08-03 NOTE — Progress Notes (Signed)
Occupational Therapy Evaluation Patient Details Name: [name] MRN: [id number] DOB: 1961-07-13 Today's Date: 08/03/2012 Time: 1003-1020 OT Time Calculation (min): 17 min  OT Assessment / Plan / Recommendation Clinical Impression  Pt s/p right TKA.  Pt able to perform functional mobility at min guard level and ADLs with mod I- min assist.  Pt will  have necessary level of assist from family upon d/c home. No further acute OT needs.    OT Assessment  Patient does not need any further OT services    Follow Up Recommendations  No OT follow up;Supervision/Assistance - 24 hour    Barriers to Discharge      Equipment Recommendations  Rolling walker with 5" wheels;3 in 1 bedside comode    Recommendations for Other Services    Frequency       Precautions / Restrictions Precautions Precautions: Knee Required Braces or Orthoses: Knee Immobilizer - Right Knee Immobilizer - Right: Other (comment) (at night or as needed) Restrictions Weight Bearing Restrictions: Yes RLE Weight Bearing: Weight bearing as tolerated   Pertinent Vitals/Pain See vitals    ADL  Eating/Feeding: Performed;Independent Where Assessed - Eating/Feeding: Chair Grooming: Performed;Wash/dry hands;Independent Where Assessed - Grooming: Unsupported standing Toilet Transfer: Performed;Min guard Toilet Transfer Method: Sit to Barista: Comfort height toilet;Grab bars Toileting - Clothing Manipulation and Hygiene: Performed;Modified independent Where Assessed - Toileting Clothing Manipulation and Hygiene: Standing Equipment Used: Gait belt;Rolling walker Transfers/Ambulation Related to ADLs: overall supervision with intermittent min guard through tight spaces in room for safety ADL Comments: Educated pt on LB dressing technique by threading RLE through clothing first.  Pt demonstrated excellent safety awareness throughout session.  Pt with good knee flexion when sitting in chair and will  have minimal difficulty with donning LB clothing.  Pt states that she plans to have mother help her with dressing as needed. Recommended pt use walk in shower when she can go upstairs safely and use 3n1 as shower seat.    OT Diagnosis:    OT Problem List:   OT Treatment Interventions:     OT Goals    Visit Information  Last OT Received On: 08/03/12 Assistance Needed: +1    Subjective Data      Prior Functioning  Vision/Perception  Home Living Lives With: Son;Daughter;Family Available Help at Discharge: Family;Available 24 hours/day (initally) Type of Home: House Home Access: Stairs to enter [employer] of Steps: 1 Entrance Stairs-Rails: None Home Layout: Two level;1/2 bath on main level;Able to live on main level with bedroom/bathroom Bathroom Shower/Tub: Tub/shower unit;Walk-in shower Bathroom Toilet: Standard Home Adaptive Equipment: Crutches Additional Comments: Pt states she is able to live on main level.  Does not plan on going upstairs initially but will sponge bathe until then. Prior Function Level of Independence: Independent Able to Take Stairs?: Yes Driving: Yes Vocation: Full time employment Communication Communication: No difficulties Dominant Hand: Right      Cognition  Overall Cognitive Status: Appears within functional limits for tasks assessed/performed Arousal/Alertness: Awake/alert Orientation Level: Appears intact for tasks assessed Behavior During Session: [facility] for tasks performed    Extremity/Trunk Assessment Right Upper Extremity Assessment RUE ROM/Strength/Tone: Within functional levels Left Upper Extremity Assessment LUE ROM/Strength/Tone: Within functional levels   Mobility Bed Mobility Bed Mobility: Not assessed (pt up in chair upon arrival) Transfers Transfers: Sit to Stand;Stand to Sit Sit to Stand: 4: Min guard;From chair/3-in-1;With armrests;With upper extremity assist;From toilet Stand to Sit: 4: Min guard;To  chair/3-in-1;To toilet;With armrests;With upper extremity assist Details  for Transfer Assistance: Pt independently demonstrated safe hand placement and correct technique.   Exercise    Balance    End of Session OT - End of Session Equipment Utilized During Treatment: Gait belt Activity Tolerance: Patient tolerated treatment well Patient left: in chair;with call bell/phone within reach Nurse Communication: Mobility status  GO    08/03/2012 Cipriano Mile OTR/L Pager [phone] Office [phone]  Cipriano Mile 08/03/2012, 10:33 AM

## 2012-08-04 LAB — CBC
MCH: 30.1 pg (ref 26.0–34.0)
MCHC: 33 g/dL (ref 30.0–36.0)
MCV: 91.1 fL (ref 78.0–100.0)
Platelets: 241 10*3/uL (ref 150–400)
RDW: 13 % (ref 11.5–15.5)

## 2012-08-04 MED ORDER — OXYCODONE HCL 5 MG PO TABS: 5.0000 mg | ORAL_TABLET | ORAL | Status: AC | PRN

## 2012-08-04 MED ORDER — FERROUS SULFATE 325 (65 FE) MG PO TABS: 325.0000 mg | ORAL_TABLET | Freq: Three times a day (TID) | ORAL | Status: DC

## 2012-08-04 MED ORDER — WARFARIN SODIUM 7.5 MG PO TABS
7.5000 mg | ORAL_TABLET | ORAL | Status: DC
  Filled 2012-08-04: qty 1.5

## 2012-08-04 MED ORDER — WARFARIN SODIUM 5 MG PO TABS: 5.0000 mg | ORAL_TABLET | ORAL | Status: DC

## 2012-08-04 NOTE — Progress Notes (Signed)
Pt. Discharged to home accompanied by family. Rx and discharge instructions given and explained. Patient instructed not to drive after taking oxy ir. Patient left unit in stable condition via wheelchair.

## 2012-08-04 NOTE — Discharge Summary (Signed)
Physician Discharge Summary  Patient ID: [name] MRN: [id number] DOB/AGE: [birth year] 51 y.o.  Admit date: 08/01/2012 Discharge date: 08/04/2012  Admission Diagnoses:  Endstage osteoarthritis right knee  Discharge Diagnoses:  Same   Surgeries: Procedure(s): TOTAL KNEE ARTHROPLASTY on 08/01/2012   Consultants: PT/OT  Discharged Condition: Stable  Hospital Course: [name] is an 51 y.o. female who was admitted 08/01/2012 with a chief complaint of No chief complaint on file. , and found to have a diagnosis of <principal problem not specified>.  They were brought to the operating room on 08/01/2012 and underwent the above named procedures.    The patient had an uncomplicated hospital course and was stable for discharge.  Recent vital signs:  Filed Vitals:   08/04/12 0557  BP: 123/72  Pulse: 85  Temp: 99.1 F (37.3 C)  Resp: 16    Recent laboratory studies:  Results for orders placed during the hospital encounter of 08/01/12  PROTIME-INR      Component Value Range   Prothrombin Time 15.4 (*) 11.6 - 15.2 seconds   INR 1.19  0.00 - 1.49  BASIC METABOLIC PANEL      Component Value Range   Sodium 136  135 - 145 mEq/L   Potassium 4.3  3.5 - 5.1 mEq/L   Chloride 101  96 - 112 mEq/L   CO2 26  19 - 32 mEq/L   Glucose, Bld 117 (*) 70 - 99 mg/dL   BUN 13  6 - 23 mg/dL   Creatinine, Ser 4.09  0.50 - 1.10 mg/dL   Calcium 8.9  8.4 - 81.1 mg/dL   GFR calc non Af Amer >90  >90 mL/min   GFR calc Af Amer >90  >90 mL/min  CBC      Component Value Range   WBC 7.1  4.0 - 10.5 K/uL   RBC 3.48 (*) 3.87 - 5.11 MIL/uL   Hemoglobin 10.3 (*) 12.0 - 15.0 g/dL   HCT 91.4 (*) 78.2 - 95.6 %   MCV 90.2  78.0 - 100.0 fL   MCH 29.6  26.0 - 34.0 pg   MCHC 32.8  30.0 - 36.0 g/dL   RDW 21.3  08.6 - 57.8 %   Platelets 242  150 - 400 K/uL  PROTIME-INR      Component Value Range   Prothrombin Time 18.5 (*) 11.6 - 15.2 seconds   INR 1.51 (*) 0.00 - 1.49  CBC      Component Value  Range   WBC 8.1  4.0 - 10.5 K/uL   RBC 3.26 (*) 3.87 - 5.11 MIL/uL   Hemoglobin 9.8 (*) 12.0 - 15.0 g/dL   HCT 46.9 (*) 62.9 - 52.8 %   MCV 90.2  78.0 - 100.0 fL   MCH 30.1  26.0 - 34.0 pg   MCHC 33.3  30.0 - 36.0 g/dL   RDW 41.3  24.4 - 01.0 %   Platelets 256  150 - 400 K/uL    Discharge Medications:   Medication List  As of 08/04/2012  8:44 AM   TAKE these medications         ferrous sulfate 325 (65 FE) MG tablet   Take 1 tablet (325 mg total) by mouth 3 (three) times daily after meals.      GLUCOSAMINE PO   Take 1 tablet by mouth 2 (two) times daily.      HYDROcodone-acetaminophen 5-325 MG per tablet   Commonly known as: NORCO/VICODIN   Take 1 tablet  by mouth every 6 (six) hours as needed. For pain      naproxen 500 MG tablet   Commonly known as: NAPROSYN   Take 1 tablet (500 mg total) by mouth 2 (two) times daily with a meal.      OVER THE COUNTER MEDICATION   Take 1 capsule by mouth daily. Takes a supplement OTC, Golden seal  & Echinasea      oxyCODONE 5 MG immediate release tablet   Commonly known as: Oxy IR/ROXICODONE   Take 1-2 tablets (5-10 mg total) by mouth every 3 (three) hours as needed.      vitamin B-12 100 MCG tablet   Commonly known as: CYANOCOBALAMIN   Take 100 mcg by mouth daily.      vitamin C 500 MG tablet   Commonly known as: ASCORBIC ACID   Take 1,000 mg by mouth daily.      vitamin E 400 UNIT capsule   Take 400 Units by mouth daily.            Diagnostic Studies: X-ray Knee Right Port  08/01/2012  *RADIOLOGY REPORT*  Clinical Data: Postop right total knee arthroplasty.  PORTABLE RIGHT KNEE - 1-2 VIEW  Comparison: 04/24/2012 radiographs.  Findings: The patient has undergone interval right total knee arthroplasty.  The hardware appears well positioned.  A surgical drain is in place.  A small amount of air is present within the joint and soft tissues surrounding the knee.  There is no evidence of acute fracture or subluxation. There is  persistent evidence of at least one loose body posteriorly on the lateral view, probably within a Baker's cyst.  IMPRESSION: No demonstrated complication related to right total knee arthroplasty. Persistent loose body posteriorly, likely in a Baker's cyst.   Original Report Authenticated By: Gerrianne Scale, M.D.     Disposition: 01-Home or Self Care  Discharge Orders    Future Orders Please Complete By Expires   Diet - low sodium heart healthy      Call MD / Call 911      Comments:   If you experience chest pain or shortness of breath, CALL 911 and be transported to the hospital emergency room.  If you develope a fever above 101 F, pus (white drainage) or increased drainage or redness at the wound, or calf pain, call your surgeon's office.   Constipation Prevention      Comments:   Drink plenty of fluids.  Prune juice may be helpful.  You may use a stool softener, such as Colace (over the counter) 100 mg twice a day.  Use MiraLax (over the counter) for constipation as needed.   Increase activity slowly as tolerated            Signed: Minda Faas B 08/04/2012, 8:44 AM

## 2012-08-04 NOTE — Progress Notes (Signed)
Physical Therapy Progress Note   08/04/12 1100  PT Visit Information  Last PT Received On 08/04/12  Assistance Needed +1  PT Time Calculation  PT Start Time 1130  PT Stop Time 1200  PT Time Calculation (min) 30 min  Subjective Data  Subjective No new complaints, feeling better today. Agreeable to therapy today.  Precautions  Precautions Knee  Restrictions  Weight Bearing Restrictions No  RLE Weight Bearing WBAT  Cognition  Overall Cognitive Status Appears within functional limits for tasks assessed/performed  Arousal/Alertness Awake/alert  Orientation Level Appears intact for tasks assessed  Behavior During Session [facility] for tasks performed  Bed Mobility  Bed Mobility Supine to Sit  Supine to Sit 7: Independent  Transfers  Transfers Sit to Stand;Stand to Sit  Sit to Stand 6: Modified independent (Device/Increase time)  Stand to Sit 6: Modified independent (Device/Increase time)  Ambulation/Gait  Ambulation/Gait Assistance 5: Supervision;6: Modified independent (Device/Increase time)  Ambulation Distance (Feet) 500 Feet  Assistive device Rolling walker  Ambulation/Gait Assistance Details Instructed pt in reciprocal gait with No KI and increased gait speed.    Gait Pattern Step-through pattern;Decreased stride length;Decreased step length - left;Decreased stance time - right;Antalgic  Gait velocity Slow initially improved with instruction.   Stairs No  Exercises  Exercises Total Joint  Total Joint Exercises  Goniometric ROM 0-70 degrees AAROM  PT - End of Session  Equipment Utilized During Treatment Gait belt;Right knee immobilizer  Activity Tolerance Patient tolerated treatment well  Patient left in chair;with call bell/phone within reach;with family/visitor present  Nurse Communication Mobility status;Weight bearing status  PT - Assessment/Plan  Comments on Treatment Session Great progress today with increased gait distance and stair education completed.  PT Plan  Discharge plan remains appropriate;Frequency remains appropriate  PT Frequency 7X/week  Follow Up Recommendations Home health PT  Equipment Recommended Rolling walker with 5" wheels;3 in 1 bedside comode  Acute Rehab PT Goals  PT Goal Formulation With patient  Time For Goal Achievement 08/08/12  Potential to Achieve Goals Good  Pt will go Supine/Side to Sit with supervision  PT Goal: Supine/Side to Sit - Progress Met  Pt will go Sit to Supine/Side with supervision  PT Goal: Sit to Supine/Side - Progress Met  Pt will go Sit to Stand with supervision  PT Goal: Sit to Stand - Progress Met  Pt will go Stand to Sit with supervision  PT Goal: Stand to Sit - Progress Met  Pt will Transfer Bed to Chair/Chair to Bed with supervision  PT Transfer Goal: Bed to Chair/Chair to Bed - Progress Met  Pt will Ambulate 51 - 150 feet;with supervision;with rolling walker  PT Goal: Ambulate - Progress Met  Pt will Go Up / Down Stairs 1-2 stairs;with supervision;with rolling walker  PT Goal: Up/Down Stairs - Progress Met  Pt will Perform Home Exercise Program with supervision, verbal cues required/provided  PT Goal: Perform Home Exercise Program - Progress Met  PT General Charges  $$ ACUTE PT VISIT 1 Procedure  PT Treatments  $Gait Training 8-22 mins  $Therapeutic Activity 8-22 mins   Dayyan Krist L. Brigg Cape DPT [phone]

## 2012-08-04 NOTE — Clinical Social Work Note (Signed)
Clinical Social Worker received referral for ST-SNF placement.  CSW reviewed chart and per MD/PT recommendations, patient is to go home with home health services already arranged by case manager.  No CSW needs identified for SNF placement at this time - inappropriate referral following standing orders.    Macario Golds, LCSW [phone]  ([address] [place], [place] [phone])

## 2012-08-04 NOTE — Progress Notes (Signed)
CARE MANAGEMENT NOTE 08/04/2012  Patient:  [name], [name]   Account Number:  [id number]  Date Initiated:  08/04/2012  Documentation initiated by:  Vance Peper  Subjective/Objective Assessment:   51 yr old female s/p right total knee arthroplasty.     Action/Plan:   CM spoke with patient and offered choice for [facility] and DME. Patient going to [address] [place], [place] [phone]   Anticipated DC Date:  08/04/2012   Anticipated DC Plan:  HOME W HOME HEALTH SERVICES      DC Planning Services  CM consult      [facility] Choice  DURABLE MEDICAL EQUIPMENT  HOME HEALTH   Choice offered to / List presented to:  C-1 Patient   DME arranged  3-N-1  [facility]ALK[facility] - ROLLING  CPM      DME agency  [facility]     [facility] arranged  HH-1 RN  HH-2 PT      Status of service:  Completed, signed off Medicare Important Message given?   (If response is "NO", the following Medicare IM given date fields will be blank) Date Medicare IM given:   Date Additional Medicare IM given:    Discharge Disposition:  HOME W HOME HEALTH SERVICES  Per UR Regulation:    If discussed at Long Length of Stay Meetings, dates discussed:    Comments:

## 2012-08-04 NOTE — Progress Notes (Signed)
Orthopedics Progress Note  Subjective: Pt doing well Mild pain to right knee but overall therapy and movement going well  Objective:  Filed Vitals:   08/04/12 0557  BP: 123/72  Pulse: 85  Temp: 99.1 F (37.3 C)  Resp: 16    General: Awake and alert  Musculoskeletal: right knee incision healing well, nv intact distally, no erythema or drainage Neurovascularly intact  Lab Results  Component Value Date   WBC 8.1 08/03/2012   HGB 9.8* 08/03/2012   HCT 29.4* 08/03/2012   MCV 90.2 08/03/2012   PLT 256 08/03/2012       Component Value Date/Time   NA 136 08/02/2012 0605   K 4.3 08/02/2012 0605   CL 101 08/02/2012 0605   CO2 26 08/02/2012 0605   GLUCOSE 117* 08/02/2012 0605   BUN 13 08/02/2012 0605   CREATININE 0.73 08/02/2012 0605   CALCIUM 8.9 08/02/2012 0605   GFRNONAA >90 08/02/2012 0605   GFRAA >90 08/02/2012 0605    Lab Results  Component Value Date   INR 1.51* 08/03/2012   INR 1.19 08/02/2012   INR 0.97 07/25/2012    Assessment/Plan: POD #3 s/p Procedure(s):right TOTAL KNEE ARTHROPLASTY  D/c home today F/u in 2 weeks Continue PT/OT  Almedia Balls. Ranell Patrick, MD 08/04/2012 8:39 AM

## 2012-08-04 NOTE — Progress Notes (Addendum)
ANTICOAGULATION CONSULT NOTE - Follow Up Consult  Pharmacy Consult for warfarin Indication: VTE prophylaxis  Allergies  Allergen Reactions  . Penicillins     Hives   . Tramadol Itching    Patient Measurements: Height: 5\' 6"  (167.6 cm) Weight: 196 lb (88.905 kg) IBW/kg (Calculated) : 59.3    Vital Signs: Temp: 99.1 F (37.3 C) (08/26 0557) Temp src: Oral (08/26 0557) BP: 123/72 mmHg (08/26 0557) Pulse Rate: 85  (08/26 0557)  Labs:  Basename 08/04/12 0844 08/03/12 0520 08/02/12 0605  HGB 9.8* 9.8* --  HCT 29.7* 29.4* 31.4*  PLT 241 256 242  APTT -- -- --  LABPROT 16.9* 18.5* 15.4*  INR 1.35 1.51* 1.19  HEPARINUNFRC -- -- --  CREATININE -- -- 0.73  CKTOTAL -- -- --  CKMB -- -- --  TROPONINI -- -- --    Estimated Creatinine Clearance: 92.3 ml/min (by C-G formula based on Cr of 0.73).    Assessment: 51 y/o female on coumadin for VTE prophylaxis s/p TKA on 8/23.  Coumadin score is 7. INR is 1.35 after coumadin 7.5 - 7.5 - 5 mg doses.  No bleeding noted.  Goal of Therapy:  INR 2-3   Plan:  1. rec DC home with 5 mg tablets and RX for 5 mg alternating with 7.5 mg QOD and INR f/u with home health. Herby Abraham, Pharm.D. [phone] 08/04/2012 11:13 AM Spoke with PA, B. Dixon,  He wrote her a script for coumadin 5 mg daily Herby Abraham, Pharm.D. [phone] 08/04/2012 11:47 AM

## 2012-08-04 NOTE — Progress Notes (Signed)
PHYSICAL THERAPY PROGRESS AND DISCHARGE NOTE   08/04/12 1300  PT Visit Information  Last PT Received On 08/04/12  Assistance Needed +1  PT Time Calculation  PT Start Time 1300  PT Stop Time 1312  PT Time Calculation (min) 12 min  Subjective Data  Subjective No new complaints, feeling better today. Agreeable to therapy today.  Precautions  Precautions Knee  Restrictions  Weight Bearing Restrictions No  RLE Weight Bearing WBAT  Cognition  Overall Cognitive Status Appears within functional limits for tasks assessed/performed  Arousal/Alertness Awake/alert  Orientation Level Appears intact for tasks assessed  Behavior During Session [facility] for tasks performed  Exercises  Exercises Total Joint  Total Joint Exercises  Ankle Circles/Pumps AROM;Both;10 reps;Supine  Quad Sets AROM;Strengthening;Right;10 reps;Supine  Heel Slides AAROM;Strengthening;Right;10 reps;Supine  Hip ABduction/ADduction AAROM;Strengthening;Right;10 reps;Supine  Straight Leg Raises AAROM;Strengthening;Right;10 reps;Supine  Short Arc Quad 5 reps;Right;Supine  PT - End of Session  Activity Tolerance Patient tolerated treatment well  Patient left in bed;with call bell/phone within reach  Nurse Communication Mobility status;Weight bearing status  PT - Assessment/Plan  Comments on Treatment Session Review HEP with pt no further acute PT needs.  PT Plan Discharge plan remains appropriate;Frequency remains appropriate  PT Frequency 7X/week  Follow Up Recommendations Home health PT  Equipment Recommended Rolling walker with 5" wheels;3 in 1 bedside comode  Acute Rehab PT Goals  PT Goal Formulation With patient  Time For Goal Achievement 08/08/12  Potential to Achieve Goals Good  Pt will Perform Home Exercise Program with supervision, verbal cues required/provided  PT Goal: Perform Home Exercise Program - Progress Met   Mersadie Kavanaugh L. Klint Lezcano DPT [phone]

## 2012-08-05 ENCOUNTER — Encounter (HOSPITAL_COMMUNITY): Payer: Self-pay | Admitting: Orthopedic Surgery

## 2014-01-10 LAB — HM MAMMOGRAPHY: HM MAMMO: NORMAL

## 2014-10-08 ENCOUNTER — Encounter: Payer: Self-pay | Admitting: Physician Assistant

## 2014-10-08 ENCOUNTER — Ambulatory Visit (INDEPENDENT_AMBULATORY_CARE_PROVIDER_SITE_OTHER): Payer: Managed Care, Other (non HMO) | Admitting: Physician Assistant

## 2014-10-08 VITALS — BP 144/93 | HR 61 | Temp 98.2°F | Resp 16 | Ht 65.5 in | Wt 192.1 lb

## 2014-10-08 DIAGNOSIS — Z7689 Persons encountering health services in other specified circumstances: Secondary | ICD-10-CM

## 2014-10-08 DIAGNOSIS — M170 Bilateral primary osteoarthritis of knee: Secondary | ICD-10-CM

## 2014-10-08 DIAGNOSIS — M720 Palmar fascial fibromatosis [Dupuytren]: Secondary | ICD-10-CM

## 2014-10-08 DIAGNOSIS — M6530 Trigger finger, unspecified finger: Secondary | ICD-10-CM

## 2014-10-08 DIAGNOSIS — M2560 Stiffness of unspecified joint, not elsewhere classified: Secondary | ICD-10-CM

## 2014-10-08 MED ORDER — METHOCARBAMOL 500 MG PO TABS: 500.0000 mg | ORAL_TABLET | Freq: Three times a day (TID) | ORAL | Status: DC

## 2014-10-08 MED ORDER — HYDROCODONE-ACETAMINOPHEN 5-325 MG PO TABS: 1.0000 | ORAL_TABLET | Freq: Four times a day (QID) | ORAL | Status: DC | PRN

## 2014-10-08 NOTE — Progress Notes (Signed)
Pre visit review using our clinic review tool, if applicable. No additional management support is needed unless otherwise documented below in the visit note/SLS  

## 2014-10-08 NOTE — Patient Instructions (Signed)
Please take medications as directed.  You will be contacted by Orthopedic Surgery for further evaluation and treatment giving your history.  Please stop by the lab for blood work and a urine drug screen as part of your controlled substance contract.  Follow-up in 1 month.

## 2014-10-08 NOTE — Progress Notes (Signed)
Patient presents to clinic today to establish care.  Acute Concerns: Spasms R thigh -- sometimes at night.  Endorses well-balanced diet. Since knee surgery. Not worsening.   Osteoarthritis Knees bilaterally -- R knee s/p TKA 2013.  Chronic pain in bilateral knees.  No current follow-up. Previously controlled with Norco and Flexeril.  Patient out of medications.  Patient c/o decreased ROM and swelling of R thumb, first, second and third fingers.  Has history of trigger finger.  Is able to close hands but hard to open the fingers.  Denies hx of RA. Endorses significant joint stiffness, but feels it lasts less than 30 minutes on a regular basis.   Past Medical History  Diagnosis Date  . Gallstones   . Bronchitis     hx of  . Migraine     hx of  . Arthritis     bilateral knees  . Anemia   . Fibroid tumor     Had partial hysterectomy    Past Surgical History  Procedure Laterality Date  . Abdominal hysterectomy      partial   . Tubal ligation    . Boil removed     . Cholecystectomy  Oct 2012  . Total knee arthroplasty  08/01/2012    Procedure: TOTAL KNEE ARTHROPLASTY;  Surgeon: Augustin Schooling, MD;  Location: [facility];  Service: Orthopedics;  Laterality: Right;  RIGHT TOTAL KNEE ARTHROPLASTY  . Wisdom tooth extraction      Current Outpatient Prescriptions on File Prior to Visit  Medication Sig Dispense Refill  . GLUCOSAMINE PO Take 1 tablet by mouth 2 (two) times daily.    Marland Kitchen OVER THE COUNTER MEDICATION Take 1 capsule by mouth daily. Takes a supplement OTC, Oceanographer    . vitamin B-12 (CYANOCOBALAMIN) 100 MCG tablet Take 100 mcg by mouth daily.    . vitamin C (ASCORBIC ACID) 500 MG tablet Take 1,000 mg by mouth daily.    . vitamin E 400 UNIT capsule Take 400 Units by mouth daily.     No current facility-administered medications on file prior to visit.    Allergies  Allergen Reactions  . Penicillins     Hives   . Tramadol Itching    Family History    Problem Relation Age of Onset  . Diabetes Father 71    Deceased  . Diabetes Mother     Living  . Diabetes Brother   . Heart disease Maternal Aunt   . Hyperlipidemia Mother   . Tuberculosis Mother   . Arthritis Other     Maternal Aunts & Uncles  . Heart disease Maternal Uncle   . Asthma Mother   . Diabetes Sister   . Kidney failure Brother 30    Diseased  . Healthy Son     x1    History   Social History  . Marital Status: Single    Spouse Name: N/A    Number of Children: N/A  . Years of Education: N/A   Occupational History  . Not on file.   Social History Main Topics  . Smoking status: Former Smoker -- 0.25 packs/day for 20 years    Types: Cigarettes  . Smokeless tobacco: Never Used  . Alcohol Use: Yes     Comment: social  . Drug Use: No  . Sexual Activity: Not Currently   Other Topics Concern  . Not on file   Social History Narrative   ROS See HPI.  All other ROS  are negative.  BP 144/93 mmHg  Pulse 61  Temp(Src) 98.2 F (36.8 C) (Oral)  Resp 16  Ht 5' 5.5" (1.664 m)  Wt 192 lb 2 oz (87.147 kg)  BMI 31.47 kg/m2  SpO2 100%  Physical Exam  Constitutional: She is oriented to person, place, and time and well-developed, well-nourished, and in no distress.  HENT:  Head: Normocephalic and atraumatic.  Eyes: Conjunctivae are normal. Pupils are equal, round, and reactive to light.  Neck: Neck supple.  Cardiovascular: Normal rate, regular rhythm, normal heart sounds and intact distal pulses.   Pulmonary/Chest: Effort normal and breath sounds normal. No respiratory distress. She has no wheezes. She has no rales. She exhibits no tenderness.  Musculoskeletal:       Right knee: She exhibits normal range of motion, no swelling, normal alignment, no LCL laxity, normal patellar mobility, no bony tenderness, normal meniscus and no MCL laxity.       Left knee: She exhibits normal range of motion, no swelling, normal alignment, no LCL laxity, normal patellar  mobility, no bony tenderness, normal meniscus and no MCL laxity.       Hands: Neurological: She is alert and oriented to person, place, and time.  Skin: Skin is warm and dry. No rash noted.  Psychiatric: Affect normal.  Vitals reviewed.  Assessment/Plan: Primary osteoarthritis of both knees CSC signed. UDS will be obtained.  Will restart Norco for breakthrough pain.  Referral placed to Orthopedic Surgery in [facility], per patient's request.  Encouraged routine stretching exercise.  Will complete FMLA paperwork to allow for office visits without detriment to patient's work performance.  Dupuytren's contracture of right hand Discussed treatment options and conservative measures.  Referral placed to orthopedic Surgery for assessment and treatment.  Trigger finger, acquired Handout given.  Conservative measures discussed.  Giving stiffness of multiple joints, will check CBC and RF.  Referral placed to Orthopedic Surgery.

## 2014-10-09 LAB — BASIC METABOLIC PANEL
BUN: 13 mg/dL (ref 6–23)
CHLORIDE: 104 meq/L (ref 96–112)
CO2: 27 mEq/L (ref 19–32)
Calcium: 9.7 mg/dL (ref 8.4–10.5)
Creat: 0.74 mg/dL (ref 0.50–1.10)
GLUCOSE: 95 mg/dL (ref 70–99)
POTASSIUM: 4.4 meq/L (ref 3.5–5.3)
SODIUM: 143 meq/L (ref 135–145)

## 2014-10-09 LAB — RHEUMATOID FACTOR: Rhuematoid fact SerPl-aCnc: <10 IU/mL (ref <=14)

## 2014-10-10 DIAGNOSIS — M170 Bilateral primary osteoarthritis of knee: Secondary | ICD-10-CM | POA: Insufficient documentation

## 2014-10-10 DIAGNOSIS — M720 Palmar fascial fibromatosis [Dupuytren]: Secondary | ICD-10-CM | POA: Insufficient documentation

## 2014-10-10 DIAGNOSIS — M2560 Stiffness of unspecified joint, not elsewhere classified: Secondary | ICD-10-CM | POA: Insufficient documentation

## 2014-10-10 DIAGNOSIS — M6530 Trigger finger, unspecified finger: Secondary | ICD-10-CM | POA: Insufficient documentation

## 2014-10-10 HISTORY — DX: Bilateral primary osteoarthritis of knee: M17.0

## 2014-10-10 HISTORY — DX: Trigger finger, unspecified finger: M65.30

## 2014-10-10 NOTE — Assessment & Plan Note (Signed)
CSC signed. UDS will be obtained.  Will restart Norco for breakthrough pain.  Referral placed to Orthopedic Surgery in [facility], per patient's request.  Encouraged routine stretching exercise.  Will complete FMLA paperwork to allow for office visits without detriment to patient's work performance.

## 2014-10-10 NOTE — Assessment & Plan Note (Signed)
Handout given.  Conservative measures discussed.  Giving stiffness of multiple joints, will check CBC and RF.  Referral placed to Orthopedic Surgery.

## 2014-10-10 NOTE — Assessment & Plan Note (Signed)
Discussed treatment options and conservative measures.  Referral placed to orthopedic Surgery for assessment and treatment.

## 2014-10-11 ENCOUNTER — Telehealth: Payer: Self-pay | Admitting: *Deleted

## 2014-10-11 ENCOUNTER — Telehealth: Payer: Self-pay | Admitting: Physician Assistant

## 2014-10-11 NOTE — Telephone Encounter (Signed)
Received completed FMLA from [employer]. Called and informed patient that forms at at front desk for her to pick up. JG//CMA

## 2014-10-11 NOTE — Telephone Encounter (Signed)
Caller name: Marjo, [place] Relation to pt: self  Call back number: [phone]   Reason for call:  Pt inquiring about test results

## 2014-10-11 NOTE — Telephone Encounter (Signed)
SEE RESULT NOTE 

## 2014-10-21 NOTE — Telephone Encounter (Signed)
Patient dropped off FMLA paperwork. She states there were a few portions that need clarification. Forms forwarded to [facility]. JG//CMA

## 2014-10-22 NOTE — Telephone Encounter (Signed)
Received completed FMLA paperwork and faxed to [phone]. Called and left detailed message informing patient. JG//CMA

## 2014-11-12 ENCOUNTER — Ambulatory Visit (INDEPENDENT_AMBULATORY_CARE_PROVIDER_SITE_OTHER): Payer: Managed Care, Other (non HMO) | Admitting: Physician Assistant

## 2014-11-12 ENCOUNTER — Encounter: Payer: Self-pay | Admitting: Physician Assistant

## 2014-11-12 VITALS — BP 137/87 | HR 80 | Temp 97.9°F | Wt 192.0 lb

## 2014-11-12 DIAGNOSIS — M6530 Trigger finger, unspecified finger: Secondary | ICD-10-CM

## 2014-11-12 DIAGNOSIS — M79642 Pain in left hand: Secondary | ICD-10-CM

## 2014-11-12 DIAGNOSIS — M170 Bilateral primary osteoarthritis of knee: Secondary | ICD-10-CM

## 2014-11-12 DIAGNOSIS — G8929 Other chronic pain: Secondary | ICD-10-CM

## 2014-11-12 MED ORDER — MELOXICAM 7.5 MG PO TABS: 7.5000 mg | ORAL_TABLET | Freq: Every day | ORAL | Status: DC

## 2014-11-12 NOTE — Progress Notes (Signed)
Pre visit review using our clinic review tool, if applicable. No additional management support is needed unless otherwise documented below in the visit note. 

## 2014-11-13 NOTE — Patient Instructions (Signed)
EPIC down at time of visit.  AVS handwritten. 

## 2014-11-13 NOTE — Assessment & Plan Note (Signed)
Rx Mobic to use daily over the next 2-3 weeks.  Norco for breakthrough pain.  New appointment with specialist set up.  Patient instructed to attend that appointment.

## 2014-11-13 NOTE — Progress Notes (Signed)
Patient presents to clinic today continued pain and discomfort of hands bilaterally.  Patient diagnosed at last visit with trigger finger and mild contracture noted on exam.  Patient given Norco to take for pain.  Lab workup unremarkable.  Has been referred to Orthopedic Surgery for management. Patient missed her new patient appointment with her surgeon.  States she did not remember which specialist she was supposed to see to make her own appointment.  Past Medical History  Diagnosis Date  . Gallstones   . Bronchitis     hx of  . Migraine     hx of  . Arthritis     bilateral knees  . Anemia   . Fibroid tumor     Had partial hysterectomy    Current Outpatient Prescriptions on File Prior to Visit  Medication Sig Dispense Refill  . GLUCOSAMINE PO Take 1 tablet by mouth 2 (two) times daily.    Marland Kitchen HYDROcodone-acetaminophen (NORCO/VICODIN) 5-325 MG per tablet Take 1 tablet by mouth every 6 (six) hours as needed. For pain 120 tablet 0  . ibuprofen (ADVIL,MOTRIN) 200 MG tablet Take 200 mg by mouth every 6 (six) hours as needed.    . methocarbamol (ROBAXIN) 500 MG tablet Take 1 tablet (500 mg total) by mouth 3 (three) times daily. 90 tablet 0  . OVER THE COUNTER MEDICATION Take 1 capsule by mouth daily. Takes a supplement OTC, Oceanographer    . vitamin B-12 (CYANOCOBALAMIN) 100 MCG tablet Take 100 mcg by mouth daily.    . vitamin C (ASCORBIC ACID) 500 MG tablet Take 1,000 mg by mouth daily.    . vitamin E 400 UNIT capsule Take 400 Units by mouth daily.     No current facility-administered medications on file prior to visit.    Allergies  Allergen Reactions  . Penicillins     Hives   . Tramadol Itching    Family History  Problem Relation Age of Onset  . Diabetes Father 52    Deceased  . Diabetes Mother     Living  . Diabetes Brother   . Heart disease Maternal Aunt   . Hyperlipidemia Mother   . Tuberculosis Mother   . Arthritis Other     Maternal Aunts & Uncles    . Heart disease Maternal Uncle   . Asthma Mother   . Diabetes Sister   . Kidney failure Brother 30    Diseased  . Healthy Son     x1    History   Social History  . Marital Status: Single    Spouse Name: N/A    Number of Children: N/A  . Years of Education: N/A   Social History Main Topics  . Smoking status: Former Smoker -- 0.25 packs/day for 20 years    Types: Cigarettes  . Smokeless tobacco: Never Used  . Alcohol Use: Yes     Comment: social  . Drug Use: No  . Sexual Activity: Not Currently   Other Topics Concern  . None   Social History Narrative    Review of Systems - See HPI.  All other ROS are negative.  BP 137/87 mmHg  Pulse 80  Temp(Src) 97.9 F (36.6 C)  Wt 192 lb (87.091 kg)  SpO2 98%  Physical Exam  Constitutional: She is oriented to person, place, and time and well-developed, well-nourished, and in no distress.  HENT:  Head: Normocephalic and atraumatic.  Eyes: Conjunctivae are normal.  Neck: Neck supple.  Cardiovascular: Normal rate, regular rhythm, normal heart sounds and intact distal pulses.   Pulmonary/Chest: Effort normal and breath sounds normal. No respiratory distress. She has no wheezes. She has no rales. She exhibits no tenderness.  Neurological: She is alert and oriented to person, place, and time.  Skin: Skin is warm and dry. No rash noted.  Psychiatric: Affect normal.  Vitals reviewed.   Recent Results (from the past 2160 hour(s))  Basic Metabolic Panel (BMET)     Status: None   Collection Time: 10/08/14  3:13 PM  Result Value Ref Range   Sodium 143 135 - 145 mEq/L   Potassium 4.4 3.5 - 5.3 mEq/L   Chloride 104 96 - 112 mEq/L   CO2 27 19 - 32 mEq/L   Glucose, Bld 95 70 - 99 mg/dL   BUN 13 6 - 23 mg/dL   Creat 0.74 0.50 - 1.10 mg/dL   Calcium 9.7 8.4 - 10.5 mg/dL  Rheumatoid Factor     Status: None   Collection Time: 10/08/14  3:13 PM  Result Value Ref Range   Rhuematoid fact SerPl-aCnc <10 <=14 IU/mL    Comment:                             Interpretive Table                     Low Positive: 15 - 41 IU/mL                     High Positive:  >= 42 IU/mL    In addition to the RF result, and clinical symptoms including joint  involvement, the 2010 ACR Classification Criteria for  scoring/diagnosing Rheumatoid Arthritis include the results of the  following tests:  CRP (41287), ESR (15010), and CCP (APCA) (86767).  www.rheumatology.org/practice/clinical/classification/ra/ra_2010.asp    Assessment/Plan: Primary osteoarthritis of both knees Rx Mobic to use daily over the next 2-3 weeks.  Norco for breakthrough pain.  New appointment with specialist set up.  Patient instructed to attend that appointment.  Trigger finger, acquired Rx Mobic to use daily over the next 2-3 weeks.  Norco for breakthrough pain.  New appointment with specialist set up.  Patient instructed to attend that appointment.

## 2015-01-12 ENCOUNTER — Ambulatory Visit (INDEPENDENT_AMBULATORY_CARE_PROVIDER_SITE_OTHER): Payer: Managed Care, Other (non HMO) | Admitting: Physician Assistant

## 2015-01-12 ENCOUNTER — Encounter: Payer: Self-pay | Admitting: Physician Assistant

## 2015-01-12 VITALS — BP 123/75 | HR 83 | Temp 98.7°F | Resp 16 | Ht 65.5 in | Wt 197.2 lb

## 2015-01-12 DIAGNOSIS — G8929 Other chronic pain: Secondary | ICD-10-CM

## 2015-01-12 DIAGNOSIS — M79642 Pain in left hand: Secondary | ICD-10-CM

## 2015-01-12 DIAGNOSIS — R399 Unspecified symptoms and signs involving the genitourinary system: Secondary | ICD-10-CM

## 2015-01-12 DIAGNOSIS — N3000 Acute cystitis without hematuria: Secondary | ICD-10-CM

## 2015-01-12 LAB — POCT URINALYSIS DIPSTICK
Bilirubin, UA: NEGATIVE
Glucose, UA: NEGATIVE
Ketones, UA: NEGATIVE
NITRITE UA: POSITIVE
Protein, UA: POSITIVE
pH, UA: 6

## 2015-01-12 MED ORDER — MELOXICAM 7.5 MG PO TABS: 7.5000 mg | ORAL_TABLET | Freq: Every day | ORAL | Status: DC

## 2015-01-12 MED ORDER — CIPROFLOXACIN HCL 500 MG PO TABS: 500.0000 mg | ORAL_TABLET | Freq: Two times a day (BID) | ORAL | Status: DC

## 2015-01-12 NOTE — Addendum Note (Signed)
Addended by: Rockwell Germany on: 01/12/2015 02:18 PM   Modules accepted: Orders

## 2015-01-12 NOTE — Patient Instructions (Signed)

## 2015-01-12 NOTE — Progress Notes (Signed)
Pre visit review using our clinic review tool, if applicable. No additional management support is needed unless otherwise documented below in the visit note/SLS  

## 2015-01-12 NOTE — Assessment & Plan Note (Signed)
POC UA with + LE, nitrites and blood.  Will send for culture.  Rx Cipro 500 mg BID x 3 days. Increase fluids.  Rest.  Probiotic.  Return precautions discussed with patient.

## 2015-01-12 NOTE — Progress Notes (Signed)
  QTM:[id number], Sandria Manly, PA-C Chief Complaint  Patient presents with  . Urinary Tract Infection    Current Issues:  Presents with 2 days of urinary urgency and urinary frequency Associated symptoms include:  cloudy urine, dysuria and lower abdominal pain  There is a previous history of of similar symptoms. Sexually active:  No   No concern for STI.  Prior to Admission medications   Medication Sig Start Date End Date Taking? Authorizing Provider  Echinacea-Goldenseal (ECHINACEA COMB/GOLDEN SEAL PO) Take by mouth.    Historical Provider, MD  GLUCOSAMINE PO Take 1 tablet by mouth 2 (two) times daily.    Historical Provider, MD  HYDROcodone-acetaminophen (NORCO/VICODIN) 5-325 MG per tablet Take 1 tablet by mouth every 6 (six) hours as needed. For pain 10/08/14   Brunetta Jeans, PA-C  ibuprofen (ADVIL,MOTRIN) 200 MG tablet Take 200 mg by mouth every 6 (six) hours as needed.    Historical Provider, MD  meloxicam (MOBIC) 7.5 MG tablet Take 1 tablet (7.5 mg total) by mouth daily. 11/12/14   Brunetta Jeans, PA-C  methocarbamol (ROBAXIN) 500 MG tablet Take 1 tablet (500 mg total) by mouth 3 (three) times daily. 10/08/14   Brunetta Jeans, PA-C  OVER THE COUNTER MEDICATION Take 1 capsule by mouth daily. Takes a supplement OTC, Golden seal  & Tax adviser, MD  vitamin B-12 (CYANOCOBALAMIN) 100 MCG tablet Take 100 mcg by mouth daily.    Historical Provider, MD  vitamin C (ASCORBIC ACID) 500 MG tablet Take 1,000 mg by mouth daily.    Historical Provider, MD  vitamin E 400 UNIT capsule Take 400 Units by mouth daily.    Historical Provider, MD   Review of Systems:Pertinent    PE:  Ht 5' 5.5" (1.664 m) BP 123/75 mmHg  Pulse 83  Temp(Src) 98.7 F (37.1 C) (Oral)  Resp 16  Ht 5' 5.5" (1.664 m)  Wt 197 lb 4 oz (89.472 kg)  BMI 32.31 kg/m2  SpO2 100% General appearance: alert, cooperative, appears stated age and no distress Head: Normocephalic, without obvious  abnormality, atraumatic Lungs: clear to auscultation bilaterally Heart: regular rate and rhythm, S1, S2 normal, no murmur, click, rub or gallop Abdomen: soft, non-tender; bowel sounds normal; no masses,  no organomegaly   Results for orders placed or performed in visit on 54/56/25  Basic Metabolic Panel (BMET)  Result Value Ref Range   Sodium 143 135 - 145 mEq/L   Potassium 4.4 3.5 - 5.3 mEq/L   Chloride 104 96 - 112 mEq/L   CO2 27 19 - 32 mEq/L   Glucose, Bld 95 70 - 99 mg/dL   BUN 13 6 - 23 mg/dL   Creat 0.74 0.50 - 1.10 mg/dL   Calcium 9.7 8.4 - 10.5 mg/dL  Rheumatoid Factor  Result Value Ref Range   Rhuematoid fact SerPl-aCnc <10 <=14 IU/mL    Assessment and Plan:  There are no diagnoses linked to this encounter.

## 2015-01-16 LAB — CULTURE, URINE COMPREHENSIVE: Colony Count: >=100000

## 2015-02-08 ENCOUNTER — Other Ambulatory Visit: Payer: Self-pay | Admitting: Physician Assistant

## 2015-02-09 NOTE — Telephone Encounter (Signed)
Medication Detail      Disp Refills Start End     meloxicam (MOBIC) 7.5 MG tablet 30 tablet 0 01/12/2015     Sig - Route: Take 1 tablet (7.5 mg total) by mouth daily. - Oral    E-Prescribing Status: Receipt confirmed by pharmacy (01/12/2015 1:34 PM EST)   Associated Diagnoses    Chronic hand pain, left      Refill sent per [facility] refill protocol/SLS

## 2015-02-23 ENCOUNTER — Ambulatory Visit (INDEPENDENT_AMBULATORY_CARE_PROVIDER_SITE_OTHER): Payer: Managed Care, Other (non HMO) | Admitting: Physician Assistant

## 2015-02-23 ENCOUNTER — Encounter: Payer: Self-pay | Admitting: Physician Assistant

## 2015-02-23 VITALS — BP 116/75 | HR 87 | Temp 98.4°F | Resp 16 | Ht 65.5 in | Wt 193.2 lb

## 2015-02-23 DIAGNOSIS — B9689 Other specified bacterial agents as the cause of diseases classified elsewhere: Secondary | ICD-10-CM

## 2015-02-23 DIAGNOSIS — J00 Acute nasopharyngitis [common cold]: Secondary | ICD-10-CM

## 2015-02-23 DIAGNOSIS — J208 Acute bronchitis due to other specified organisms: Principal | ICD-10-CM

## 2015-02-23 MED ORDER — PROMETHAZINE-DM 6.25-15 MG/5ML PO SYRP: 5.0000 mL | ORAL_SOLUTION | Freq: Four times a day (QID) | ORAL | Status: DC | PRN

## 2015-02-23 MED ORDER — AZITHROMYCIN 250 MG PO TABS: ORAL_TABLET | ORAL | Status: DC

## 2015-02-23 NOTE — Assessment & Plan Note (Signed)
Rx azithromycin. Increase fluids. Rx promethazine DM cough syrup. Get plenty of rest. Plain Mucinex for congestion. Place humidifier in bedroom. Suspect GI upset secondary to PND. Can use OTC Imodium if needed. Call or return to clinic if symptoms not improving.

## 2015-02-23 NOTE — Patient Instructions (Signed)
Please take the antibiotic as directed. Stay well hydrated. Use plain Mucinex to help break up congestion. Use the prescription cough syrup I gave you to help calm down the cough. This will also help with the nausea and vomiting. Please place a humidifier in bedroom. You can use some over-the-counter Imodium if needed for loose stools. Symptoms should improve daily.

## 2015-02-23 NOTE — Progress Notes (Signed)
Patient presents to clinic today c/o greater than one week of progressively worsening chest congestion, fatigue and cough productive of dark yellow sputum. Denies fever, chills or shortness of breath. Denies recent travel. Has not had post tussive emetic spell yesterday. Has noticed some stomach upset with drainage. Has had some loose stools. Denies hematochezia, melena or tenesmus. Has not taken anything for her symptoms. States loose stools have improved markedly. Denies recurrent emetic episode.  Past Medical History  Diagnosis Date  . Gallstones   . Bronchitis     hx of  . Migraine     hx of  . Arthritis     bilateral knees  . Anemia   . Fibroid tumor     Had partial hysterectomy    Current Outpatient Prescriptions on File Prior to Visit  Medication Sig Dispense Refill  . ciprofloxacin (CIPRO) 500 MG tablet Take 1 tablet (500 mg total) by mouth 2 (two) times daily. 6 tablet 0  . Echinacea-Goldenseal (ECHINACEA COMB/GOLDEN SEAL PO) Take by mouth.    Marland Kitchen GLUCOSAMINE PO Take 1 tablet by mouth 2 (two) times daily.    Marland Kitchen HYDROcodone-acetaminophen (NORCO/VICODIN) 5-325 MG per tablet Take 1 tablet by mouth every 6 (six) hours as needed. For pain 120 tablet 0  . ibuprofen (ADVIL,MOTRIN) 200 MG tablet Take 200 mg by mouth every 6 (six) hours as needed.    . meloxicam (MOBIC) 7.5 MG tablet TAKE 1 TABLET (7.5 MG TOTAL) BY MOUTH DAILY. 30 tablet 0  . methocarbamol (ROBAXIN) 500 MG tablet Take 1 tablet (500 mg total) by mouth 3 (three) times daily. 90 tablet 0  . vitamin B-12 (CYANOCOBALAMIN) 100 MCG tablet Take 100 mcg by mouth daily.    . vitamin C (ASCORBIC ACID) 500 MG tablet Take 1,000 mg by mouth daily.    . vitamin E 400 UNIT capsule Take 400 Units by mouth daily.     No current facility-administered medications on file prior to visit.    Allergies  Allergen Reactions  . Penicillins     Hives   . Tramadol Itching    Family History  Problem Relation Age of Onset  . Diabetes  Father 6    Deceased  . Diabetes Mother     Living  . Diabetes Brother   . Heart disease Maternal Aunt   . Hyperlipidemia Mother   . Tuberculosis Mother   . Arthritis Other     Maternal Aunts & Uncles  . Heart disease Maternal Uncle   . Asthma Mother   . Diabetes Sister   . Kidney failure Brother 30    Diseased  . Healthy Son     x1    History   Social History  . Marital Status: Single    Spouse Name: N/A  . Number of Children: N/A  . Years of Education: N/A   Social History Main Topics  . Smoking status: Former Smoker -- 0.25 packs/day for 20 years    Types: Cigarettes  . Smokeless tobacco: Never Used  . Alcohol Use: Yes     Comment: social  . Drug Use: No  . Sexual Activity: Not Currently   Other Topics Concern  . None   Social History Narrative    Review of Systems - See HPI.  All other ROS are negative.  BP 116/75 mmHg  Pulse 87  Temp(Src) 98.4 F (36.9 C) (Oral)  Resp 16  Ht 5' 5.5" (1.664 m)  Wt 193 lb 4 oz (87.658 kg)  BMI 31.66 kg/m2  SpO2 98%  Physical Exam  Constitutional: She is oriented to person, place, and time and well-developed, well-nourished, and in no distress.  HENT:  Head: Normocephalic and atraumatic.  Right Ear: External ear normal.  Left Ear: External ear normal.  Nose: Nose normal.  Mouth/Throat: Oropharynx is clear and moist. No oropharyngeal exudate.  Tympanic membranes within normal limits bilaterally.  Eyes: Conjunctivae are normal. Pupils are equal, round, and reactive to light.  Neck: Neck supple. No thyromegaly present.  Cardiovascular: Normal rate, regular rhythm, normal heart sounds and intact distal pulses.   Pulmonary/Chest: Effort normal and breath sounds normal. No respiratory distress. She has no wheezes. She has no rales. She exhibits no tenderness.  Abdominal: Soft. Bowel sounds are normal. She exhibits no distension and no mass. There is no tenderness. There is no rebound and no guarding.  Lymphadenopathy:      She has no cervical adenopathy.  Neurological: She is alert and oriented to person, place, and time.  Skin: Skin is warm and dry. No rash noted.  Psychiatric: Affect normal.  Vitals reviewed.   Recent Results (from the past 2160 hour(s))  POCT urinalysis dipstick     Status: Abnormal   Collection Time: 01/12/15  2:14 PM  Result Value Ref Range   Color, UA gold    Clarity, UA murky    Glucose, UA neg    Bilirubin, UA neg    Ketones, UA neg    Spec Grav, UA >=1.030    Blood, UA Large 3+++     Comment: Hemolyzed   pH, UA 6.0    Protein, UA Positive 2+    Urobilinogen, UA >=8.0     Comment: 17   Nitrite, UA Positive    Leukocytes, UA large (3+)   CULTURE, URINE COMPREHENSIVE     Status: None   Collection Time: 01/12/15  2:19 PM  Result Value Ref Range   Culture ESCHERICHIA COLI    Colony Count >=100,000 COLONIES/ML    Organism ID, Bacteria ESCHERICHIA COLI     Comment: Two isolates with different morphologies were identified as the same organism.The most resistant organism was reported.       Susceptibility   Escherichia coli -  (no method available)    AMPICILLIN <=2 Sensitive     AMOX/CLAVULANIC <=2 Sensitive     AMPICILLIN/SULBACTAM <=2 Sensitive     PIP/TAZO <=4 Sensitive     IMIPENEM <=0.25 Sensitive     CEFAZOLIN <=4 Sensitive     CEFTRIAXONE <=1 Sensitive     CEFTAZIDIME <=1 Sensitive     CEFEPIME <=1 Sensitive     GENTAMICIN <=1 Sensitive     TOBRAMYCIN <=1 Sensitive     CIPROFLOXACIN <=0.25 Sensitive     LEVOFLOXACIN <=0.12 Sensitive     NITROFURANTOIN <=16 Sensitive     TRIMETH/SULFA <=20 Sensitive     Assessment/Plan: Acute bacterial bronchitis Rx azithromycin. Increase fluids. Rx promethazine DM cough syrup. Get plenty of rest. Plain Mucinex for congestion. Place humidifier in bedroom. Suspect GI upset secondary to PND. Can use OTC Imodium if needed. Call or return to clinic if symptoms not improving.

## 2015-02-23 NOTE — Progress Notes (Signed)
Pre visit review using our clinic review tool, if applicable. No additional management support is needed unless otherwise documented below in the visit note/SLS  

## 2015-04-15 HISTORY — PX: CARPAL TUNNEL RELEASE: SHX101

## 2015-04-15 HISTORY — PX: ELBOW SURGERY: SHX618

## 2015-04-15 HISTORY — PX: HAND SURGERY: SHX662

## 2015-04-15 HISTORY — PX: TRIGGER FINGER RELEASE: SHX641

## 2015-04-15 HISTORY — PX: OTHER SURGICAL HISTORY: SHX169

## 2015-05-13 ENCOUNTER — Other Ambulatory Visit: Payer: Self-pay | Admitting: Physician Assistant

## 2015-05-25 ENCOUNTER — Encounter: Payer: Self-pay | Admitting: Physician Assistant

## 2015-05-25 ENCOUNTER — Ambulatory Visit (INDEPENDENT_AMBULATORY_CARE_PROVIDER_SITE_OTHER): Payer: Managed Care, Other (non HMO) | Admitting: Physician Assistant

## 2015-05-25 VITALS — BP 150/80 | HR 72 | Temp 98.5°F | Resp 16 | Ht 65.5 in | Wt 206.0 lb

## 2015-05-25 DIAGNOSIS — R21 Rash and other nonspecific skin eruption: Secondary | ICD-10-CM | POA: Diagnosis not present

## 2015-05-25 DIAGNOSIS — M170 Bilateral primary osteoarthritis of knee: Secondary | ICD-10-CM

## 2015-05-25 MED ORDER — CLOBETASOL PROPIONATE 0.05 % EX CREA: 1.0000 "application " | TOPICAL_CREAM | Freq: Two times a day (BID) | CUTANEOUS | Status: DC

## 2015-05-25 NOTE — Progress Notes (Signed)
Pre visit review using our clinic review tool, if applicable. No additional management support is needed unless otherwise documented below in the visit note. 

## 2015-05-25 NOTE — Assessment & Plan Note (Signed)
Paperwork filled out with patient in office. Encouraged her to let Orthopedist fill out as well. Continue pain medication.  Follow-up with specialist as scheduled.

## 2015-05-25 NOTE — Progress Notes (Signed)
Patient presents to clinic today to discuss paperwork regarding disability.  Patient with history of severe bilateral OA of knees, s/p R TKR and upcoming L TKR.  Also with bilateral carpal tunnel with recent R CT release.  Will be having CT release of left wrist in the upcoming months.  Is followed by multiple specialists. 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  Patient also notes rash of forearm since Monday that is itchy and worsens in the heat.  Denies change to soaps or lotions.  Denies new pet. Denies yard work.  Denies sick contact with similar rash.  Past Medical History  Diagnosis Date  . Gallstones   . Bronchitis     hx of  . Migraine     hx of  . Arthritis     bilateral knees  . Anemia   . Fibroid tumor     Had partial hysterectomy    Current Outpatient Prescriptions on File Prior to Visit  Medication Sig Dispense Refill  . Echinacea-Goldenseal (ECHINACEA COMB/GOLDEN SEAL PO) Take by mouth.    Marland Kitchen GLUCOSAMINE PO Take 1 tablet by mouth 2 (two) times daily.    Marland Kitchen ibuprofen (ADVIL,MOTRIN) 200 MG tablet Take 200 mg by mouth every 6 (six) hours as needed.    . meloxicam (MOBIC) 7.5 MG tablet TAKE 1 TABLET (7.5 MG TOTAL) BY MOUTH DAILY. 30 tablet 0  . meloxicam (MOBIC) 7.5 MG tablet TAKE 1 TABLET (7.5 MG TOTAL) BY MOUTH DAILY. 30 tablet 0  . methocarbamol (ROBAXIN) 500 MG tablet TAKE 1 TABLET BY MOUTH 3 TIMES A DAY 90 tablet 0  . promethazine-dextromethorphan (PROMETHAZINE-DM) 6.25-15 MG/5ML syrup Take 5 mLs by mouth 4 (four) times daily as needed. 118 mL 0  . vitamin B-12 (CYANOCOBALAMIN) 100 MCG tablet Take 100 mcg by mouth daily.    . vitamin C (ASCORBIC ACID) 500 MG tablet Take 1,000 mg by mouth daily.    . vitamin E 400 UNIT capsule Take 400 Units by mouth daily.     No current facility-administered medications on file prior to visit.    Allergies  Allergen Reactions  . Penicillins     Hives   . Tramadol Itching    Family  History  Problem Relation Age of Onset  . Diabetes Father 48    Deceased  . Diabetes Mother     Living  . Diabetes Brother   . Heart disease Maternal Aunt   . Hyperlipidemia Mother   . Tuberculosis Mother   . Arthritis Other     Maternal Aunts & Uncles  . Heart disease Maternal Uncle   . Asthma Mother   . Diabetes Sister   . Kidney failure Brother 30    Diseased  . Healthy Son     x1    History   Social History  . Marital Status: Single    Spouse Name: N/A  . Number of Children: N/A  . Years of Education: N/A   Social History Main Topics  . Smoking status: Former Smoker -- 0.25 packs/day for 20 years    Types: Cigarettes  . Smokeless tobacco: Never Used  . Alcohol Use: Yes     Comment: social  . Drug Use: No  . Sexual Activity: Not Currently   Other Topics Concern  . None   Social History Narrative   Review of Systems - See HPI.  All other ROS are negative.  BP 150/80 mmHg  Pulse 72  Temp(Src) 98.5 F (36.9 C) (Oral)  Resp 16  Ht 5' 5.5" (1.664 m)  Wt 206 lb (93.441 kg)  BMI 33.75 kg/m2  SpO2 98%  Physical Exam  Constitutional: She is oriented to person, place, and time and well-developed, well-nourished, and in no distress.  HENT:  Head: Normocephalic and atraumatic.  Eyes: Conjunctivae are normal.  Cardiovascular: Normal rate, regular rhythm and normal heart sounds.   Pulmonary/Chest: Effort normal.  Neurological: She is alert and oriented to person, place, and time.  Skin: Skin is warm and dry. Rash noted.  Vitals reviewed.  Assessment/Plan: Primary osteoarthritis of both knees Paperwork filled out with patient in office. Encouraged her to let Orthopedist fill out as well. Continue pain medication.  Follow-up with specialist as scheduled.  Rash and nonspecific skin eruption Suspect allergic in nature. Rx Clobetasol ointment to apply BID. Supportive measures reviewed.  Follow-up if symptoms are not resolving.

## 2015-05-25 NOTE — Assessment & Plan Note (Signed)
Suspect allergic in nature. Rx Clobetasol ointment to apply BID. Supportive measures reviewed.  Follow-up if symptoms are not resolving.

## 2015-05-31 ENCOUNTER — Telehealth: Payer: Self-pay | Admitting: *Deleted

## 2015-05-31 NOTE — Telephone Encounter (Signed)
Pt signed ROI request received via fax from [employer]ability [employer]. Forwarded to Jordan to scan/email to medical records. JG/CMA 

## 2015-06-13 ENCOUNTER — Other Ambulatory Visit: Payer: Self-pay | Admitting: Physician Assistant

## 2015-07-27 ENCOUNTER — Other Ambulatory Visit: Payer: Self-pay | Admitting: Physician Assistant

## 2015-11-28 ENCOUNTER — Encounter: Payer: Self-pay | Admitting: Physician Assistant

## 2015-11-28 ENCOUNTER — Ambulatory Visit (INDEPENDENT_AMBULATORY_CARE_PROVIDER_SITE_OTHER): Payer: PRIVATE HEALTH INSURANCE | Admitting: Physician Assistant

## 2015-11-28 ENCOUNTER — Ambulatory Visit: Payer: Managed Care, Other (non HMO) | Admitting: Physician Assistant

## 2015-11-28 VITALS — BP 138/90 | HR 70 | Temp 98.0°F | Ht 65.5 in | Wt 213.6 lb

## 2015-11-28 DIAGNOSIS — M5412 Radiculopathy, cervical region: Secondary | ICD-10-CM

## 2015-11-28 HISTORY — DX: Radiculopathy, cervical region: M54.12

## 2015-11-28 MED ORDER — CYCLOBENZAPRINE HCL 10 MG PO TABS: 10.0000 mg | ORAL_TABLET | Freq: Every day | ORAL | Status: DC

## 2015-11-28 MED ORDER — OXYCODONE-ACETAMINOPHEN 5-325 MG PO TABS: 1.0000 | ORAL_TABLET | Freq: Three times a day (TID) | ORAL | Status: DC | PRN

## 2015-11-28 MED ORDER — METHYLPREDNISOLONE 4 MG PO TBPK
ORAL_TABLET | ORAL | Status: DC
Start: 2015-11-28 — End: 2015-12-19

## 2015-11-28 NOTE — Patient Instructions (Signed)
Please take medications as directed. No heavy lifting or overexertion. Stop by the checkout and have them give you a medical record release form to sign so I can get your records.  If symptoms not resolving, please follow-up in office so imaging can be obtained.

## 2015-11-28 NOTE — Progress Notes (Signed)
Pre visit review using our clinic review tool, if applicable. No additional management support is needed unless otherwise documented below in the visit note. 

## 2015-11-28 NOTE — Assessment & Plan Note (Signed)
And muscle spasm. Rx Medrol pack. Flexeril at bedtime. Supportive measures reviewed. Percocet refilled. Discussed imaging but patient declines until new insurance takes effect. Encouraged her to reconsider if pain is not resolving.

## 2015-11-28 NOTE — Progress Notes (Signed)
Patient presents to clinic today c/o R sided neck pain radiation into RUE with numbness and tingling constant over the past 2 weeks. Denies trauma or injury. Denies radiation into LUE. Has not taken anything for symptoms.  Past Medical History  Diagnosis Date  . Gallstones   . Bronchitis     hx of  . Migraine     hx of  . Arthritis     bilateral knees  . Anemia   . Fibroid tumor     Had partial hysterectomy    Current Outpatient Prescriptions on File Prior to Visit  Medication Sig Dispense Refill  . clobetasol cream (TEMOVATE) [id number] % Apply 1 application topically 2 (two) times daily. 30 g 0  . Echinacea-Goldenseal (ECHINACEA COMB/GOLDEN SEAL PO) Take by mouth.    Marland Kitchen GLUCOSAMINE PO Take 1 tablet by mouth 2 (two) times daily.    Marland Kitchen ibuprofen (ADVIL,MOTRIN) 200 MG tablet Take 200 mg by mouth every 6 (six) hours as needed.    . vitamin B-12 (CYANOCOBALAMIN) 100 MCG tablet Take 100 mcg by mouth daily.    . vitamin C (ASCORBIC ACID) 500 MG tablet Take 1,000 mg by mouth daily.    . vitamin E 400 UNIT capsule Take 400 Units by mouth daily.     No current facility-administered medications on file prior to visit.    Allergies  Allergen Reactions  . Penicillins     Hives   . Tramadol Itching    Family History  Problem Relation Age of Onset  . Diabetes Father 50    Deceased  . Diabetes Mother     Living  . Diabetes Brother   . Heart disease Maternal Aunt   . Hyperlipidemia Mother   . Tuberculosis Mother   . Arthritis Other     Maternal Aunts & Uncles  . Heart disease Maternal Uncle   . Asthma Mother   . Diabetes Sister   . Kidney failure Brother 30    Diseased  . Healthy Son     x1    Social History   Social History  . Marital Status: Single    Spouse Name: N/A  . Number of Children: N/A  . Years of Education: N/A   Social History Main Topics  . Smoking status: Former Smoker -- 0.25 packs/day for 20 years    Types: Cigarettes  . Smokeless tobacco: Never  Used  . Alcohol Use: Yes     Comment: social  . Drug Use: No  . Sexual Activity: Not Currently   Other Topics Concern  . None   Social History Narrative   Review of Systems - See HPI.  All other ROS are negative.  BP 138/90 mmHg  Pulse 70  Temp(Src) 98 F (36.7 C) (Oral)  Ht 5' 5.5" (1.664 m)  Wt 213 lb 9.6 oz (96.888 kg)  BMI 34.99 kg/m2  SpO2 99%  Physical Exam  Constitutional: She is oriented to person, place, and time and well-developed, well-nourished, and in no distress.  HENT:  Head: Normocephalic and atraumatic.  Neck: Neck supple.  Cardiovascular: Normal rate, regular rhythm, normal heart sounds and intact distal pulses.   Pulmonary/Chest: Effort normal and breath sounds normal. No respiratory distress. She has no wheezes. She has no rales. She exhibits no tenderness.  Musculoskeletal:       Cervical back: She exhibits tenderness, pain and spasm.  Neurological: She is alert and oriented to person, place, and time.  Skin: Skin is warm and dry.  No rash noted.  Psychiatric: Affect normal.  Vitals reviewed.  No results found for this or any previous visit (from the past 2160 hour(s)).  Assessment/Plan: Cervical radiculopathy And muscle spasm. Rx Medrol pack. Flexeril at bedtime. Supportive measures reviewed. Percocet refilled. Discussed imaging but patient declines until new insurance takes effect. Encouraged her to reconsider if pain is not resolving.

## 2015-12-08 ENCOUNTER — Telehealth: Payer: Self-pay | Admitting: Physician Assistant

## 2015-12-08 NOTE — Telephone Encounter (Signed)
Caller name: Lowella Bandy   Relationship to patient: Self   Can be reached: [phone]   Reason for call: Pt called in to inform PCP that she is still having the same concerns and that the medication isn't working for her. She would like to be advised further.

## 2015-12-08 NOTE — Telephone Encounter (Signed)
Left message for pt to call back about concerns

## 2015-12-09 NOTE — Telephone Encounter (Signed)
Left message for pt to call back to schedule an appointment to follow up with Elyn Aquas next week.

## 2015-12-09 NOTE — Telephone Encounter (Signed)
Please see note below and have pt come in for an appointment.

## 2015-12-14 ENCOUNTER — Telehealth: Payer: Self-pay | Admitting: Physician Assistant

## 2015-12-14 NOTE — Telephone Encounter (Signed)
See previous phone note (12/29) -- she needs follow-up appointment. Have her schedule.

## 2015-12-14 NOTE — Telephone Encounter (Signed)
Relation to [id number] Call back number:[phone] Pharmacy: [facility] #[id number] - [place], [place]  Reason for call:  Patient states methylPREDNISolone (MEDROL DOSEPAK) 4 MG TBPK tablet and cyclobenzaprine (FLEXERIL) 10 MG tablet is not working in need of clinical advice

## 2015-12-16 NOTE — Telephone Encounter (Signed)
Patient informed. 

## 2015-12-19 ENCOUNTER — Ambulatory Visit (HOSPITAL_BASED_OUTPATIENT_CLINIC_OR_DEPARTMENT_OTHER)
Admission: RE | Admit: 2015-12-19 | Discharge: 2015-12-19 | Disposition: A | Discharge status: Home / Self Care | Payer: BLUE CROSS/BLUE SHIELD | Source: Ambulatory Visit | Attending: Physician Assistant | Admitting: Physician Assistant

## 2015-12-19 ENCOUNTER — Ambulatory Visit (INDEPENDENT_AMBULATORY_CARE_PROVIDER_SITE_OTHER): Payer: BLUE CROSS/BLUE SHIELD | Admitting: Physician Assistant

## 2015-12-19 ENCOUNTER — Encounter: Payer: Self-pay | Admitting: Physician Assistant

## 2015-12-19 ENCOUNTER — Encounter: Payer: Self-pay | Admitting: Behavioral Health

## 2015-12-19 ENCOUNTER — Telehealth: Payer: Self-pay | Admitting: Behavioral Health

## 2015-12-19 VITALS — BP 140/90 | HR 78 | Temp 98.1°F | Ht 65.5 in | Wt 213.8 lb

## 2015-12-19 DIAGNOSIS — M47892 Other spondylosis, cervical region: Secondary | ICD-10-CM | POA: Insufficient documentation

## 2015-12-19 DIAGNOSIS — M5412 Radiculopathy, cervical region: Secondary | ICD-10-CM

## 2015-12-19 DIAGNOSIS — M4802 Spinal stenosis, cervical region: Secondary | ICD-10-CM | POA: Diagnosis not present

## 2015-12-19 DIAGNOSIS — M542 Cervicalgia: Secondary | ICD-10-CM | POA: Diagnosis present

## 2015-12-19 MED ORDER — METHYLPREDNISOLONE ACETATE 80 MG/ML IJ SUSP
80.0000 mg | Freq: Once | INTRAMUSCULAR | Status: AC
Start: 2015-12-19 — End: 2015-12-19
  Administered 2015-12-19: 80 mg via INTRAMUSCULAR

## 2015-12-19 MED ORDER — OXYCODONE-ACETAMINOPHEN 10-325 MG PO TABS: 1.0000 | ORAL_TABLET | Freq: Three times a day (TID) | ORAL | Status: DC | PRN

## 2015-12-19 NOTE — Progress Notes (Signed)
Patient presents to clinic today c/o continued pain in neck with radiation into upper extremities. Has had recent shoulder x-rays with Rheumatologist revealing mild arthritis but no other abnormalities. Endorses medrol dose pack improved symptoms initially but they have worsened since that time. Is taking Percocet and Flexeril as directed with some relief in symptoms.   Past Medical History  Diagnosis Date  . Gallstones   . Bronchitis     hx of  . Migraine     hx of  . Arthritis     bilateral knees  . Anemia   . Fibroid tumor     Had partial hysterectomy    Current Outpatient Prescriptions on File Prior to Visit  Medication Sig Dispense Refill  . clobetasol cream (TEMOVATE) [id number] % Apply 1 application topically 2 (two) times daily. 30 g 0  . cyclobenzaprine (FLEXERIL) 10 MG tablet Take 1 tablet (10 mg total) by mouth at bedtime. 30 tablet 0  . Echinacea-Goldenseal (ECHINACEA COMB/GOLDEN SEAL PO) Take by mouth.    Marland Kitchen GLUCOSAMINE PO Take 1 tablet by mouth 2 (two) times daily.    . vitamin B-12 (CYANOCOBALAMIN) 100 MCG tablet Take 100 mcg by mouth daily.    . vitamin C (ASCORBIC ACID) 500 MG tablet Take 1,000 mg by mouth daily.    . vitamin E 400 UNIT capsule Take 400 Units by mouth daily.    Marland Kitchen ibuprofen (ADVIL,MOTRIN) 200 MG tablet Take 200 mg by mouth every 6 (six) hours as needed. Reported on 12/19/2015     No current facility-administered medications on file prior to visit.    Allergies  Allergen Reactions  . Penicillins     Hives   . Tramadol Itching    Family History  Problem Relation Age of Onset  . Diabetes Father 43    Deceased  . Diabetes Mother     Living  . Diabetes Brother   . Heart disease Maternal Aunt   . Hyperlipidemia Mother   . Tuberculosis Mother   . Arthritis Other     Maternal Aunts & Uncles  . Heart disease Maternal Uncle   . Asthma Mother   . Diabetes Sister   . Kidney failure Brother 30    Diseased  . Healthy Son     x1    Social History    Social History  . Marital Status: Single    Spouse Name: N/A  . Number of Children: N/A  . Years of Education: N/A   Social History Main Topics  . Smoking status: Former Smoker -- 0.25 packs/day for 20 years    Types: Cigarettes  . Smokeless tobacco: Never Used  . Alcohol Use: Yes     Comment: social  . Drug Use: No  . Sexual Activity: Not Currently   Other Topics Concern  . None   Social History Narrative   Review of Systems - See HPI.  All other ROS are negative.  BP 140/90 mmHg  Pulse 78  Temp(Src) 98.1 F (36.7 C) (Oral)  Ht 5' 5.5" (1.664 m)  Wt 213 lb 12.8 oz (96.979 kg)  BMI 35.02 kg/m2  SpO2 98%  Physical Exam  Constitutional: She is oriented to person, place, and time and well-developed, well-nourished, and in no distress.  HENT:  Head: Normocephalic and atraumatic.  Eyes: Conjunctivae are normal.  Cardiovascular: Normal rate, regular rhythm, normal heart sounds and intact distal pulses.   Pulmonary/Chest: Effort normal and breath sounds normal. No respiratory distress. She has no wheezes. She  has no rales. She exhibits no tenderness.  Musculoskeletal:       Cervical back: She exhibits pain and spasm. She exhibits no tenderness and no bony tenderness.  Neurological: She is alert and oriented to person, place, and time.  Vitals reviewed.   No results found for this or any previous visit (from the past 2160 hour(s)).  Assessment/Plan: Cervical radiculopathy Will obtain x-ray today to assess. Will likely need MRI. IM Depomedrol 80 mg given today. Will increase Percocet to 10-325 every 8 hours. Continue Flexeril. Will alter regimen based on results.

## 2015-12-19 NOTE — Addendum Note (Signed)
Addended by: Harl Bowie on: 12/19/2015 04:09 PM   Modules accepted: Orders

## 2015-12-19 NOTE — Patient Instructions (Addendum)
Please go downstairs for imaging. I will call you with your results. Please start the new dose of Percocet. Continue the Flexeril. We will alter your regimen based on the results.

## 2015-12-19 NOTE — Progress Notes (Signed)
Pre visit review using our clinic review tool, if applicable. No additional management support is needed unless otherwise documented below in the visit note. 

## 2015-12-19 NOTE — Assessment & Plan Note (Signed)
Will obtain x-ray today to assess. Will likely need MRI. IM Depomedrol 80 mg given today. Will increase Percocet to 10-325 every 8 hours. Continue Flexeril. Will alter regimen based on results.

## 2015-12-19 NOTE — Telephone Encounter (Signed)
Pre-Visit Call completed with patient and chart updated.   Pre-Visit Info documented in Specialty Comments under [employer].    

## 2015-12-20 ENCOUNTER — Encounter: Payer: Self-pay | Admitting: Physician Assistant

## 2015-12-20 ENCOUNTER — Other Ambulatory Visit (HOSPITAL_COMMUNITY)
Admission: RE | Admit: 2015-12-20 | Discharge: 2015-12-20 | Disposition: A | Discharge status: Home / Self Care | Payer: BLUE CROSS/BLUE SHIELD | Source: Ambulatory Visit | Attending: Physician Assistant | Admitting: Physician Assistant

## 2015-12-20 ENCOUNTER — Telehealth: Payer: Self-pay | Admitting: Physician Assistant

## 2015-12-20 ENCOUNTER — Ambulatory Visit (INDEPENDENT_AMBULATORY_CARE_PROVIDER_SITE_OTHER): Payer: BLUE CROSS/BLUE SHIELD | Admitting: Physician Assistant

## 2015-12-20 VITALS — BP 122/80 | HR 84 | Temp 98.1°F | Ht 65.5 in | Wt 213.4 lb

## 2015-12-20 DIAGNOSIS — Z1211 Encounter for screening for malignant neoplasm of colon: Secondary | ICD-10-CM

## 2015-12-20 DIAGNOSIS — Z1239 Encounter for other screening for malignant neoplasm of breast: Secondary | ICD-10-CM | POA: Diagnosis not present

## 2015-12-20 DIAGNOSIS — N898 Other specified noninflammatory disorders of vagina: Secondary | ICD-10-CM

## 2015-12-20 DIAGNOSIS — N760 Acute vaginitis: Secondary | ICD-10-CM | POA: Insufficient documentation

## 2015-12-20 DIAGNOSIS — Z113 Encounter for screening for infections with a predominantly sexual mode of transmission: Secondary | ICD-10-CM | POA: Insufficient documentation

## 2015-12-20 DIAGNOSIS — M5412 Radiculopathy, cervical region: Secondary | ICD-10-CM

## 2015-12-20 DIAGNOSIS — Z0000 Encounter for general adult medical examination without abnormal findings: Secondary | ICD-10-CM

## 2015-12-20 HISTORY — DX: Encounter for other screening for malignant neoplasm of breast: Z12.39

## 2015-12-20 HISTORY — DX: Encounter for general adult medical examination without abnormal findings: Z00.00

## 2015-12-20 MED ORDER — GABAPENTIN 100 MG PO CAPS: 100.0000 mg | ORAL_CAPSULE | Freq: Two times a day (BID) | ORAL | Status: DC

## 2015-12-20 NOTE — Assessment & Plan Note (Signed)
X-ray results reviewed. Will add-on Gabapentin 100 mg BID and refer to Neurosurgery. Continue other medications as directed.

## 2015-12-20 NOTE — Assessment & Plan Note (Signed)
Order for screening mammogram placed.  

## 2015-12-20 NOTE — Assessment & Plan Note (Signed)
Referral to GI for screening colonoscopy placed.

## 2015-12-20 NOTE — Patient Instructions (Signed)
Please continue medications as directed, starting the Gabapentin twice daily (morning and evening). You will be contacted for assessment by Neurosurgery.  Please go to the lab for blood work. I will call you with your results. We will treat according to results. Stay active and eat a well-balanced diet.  Follow-up will be scheduled based on your results.  Preventive Care for Adults, Female A healthy lifestyle and preventive care can promote health and wellness. Preventive health guidelines for women include the following key practices.  A routine yearly physical is a good way to check with your health care provider about your health and preventive screening. It is a chance to share any concerns and updates on your health and to receive a thorough exam.  Visit your dentist for a routine exam and preventive care every 6 months. Brush your teeth twice a day and floss once a day. Good oral hygiene prevents tooth decay and gum disease.  The frequency of eye exams is based on your age, health, family medical history, use of contact lenses, and other factors. Follow your health care provider's recommendations for frequency of eye exams.  Eat a healthy diet. Foods like vegetables, fruits, whole grains, low-fat dairy products, and lean protein foods contain the nutrients you need without too many calories. Decrease your intake of foods high in solid fats, added sugars, and salt. Eat the right amount of calories for you.Get information about a proper diet from your health care provider, if necessary.  Regular physical exercise is one of the most important things you can do for your health. Most adults should get at least 150 minutes of moderate-intensity exercise (any activity that increases your heart rate and causes you to sweat) each week. In addition, most adults need muscle-strengthening exercises on 2 or more days a week.  Maintain a healthy weight. The body mass index (BMI) is a screening tool to  identify possible weight problems. It provides an estimate of body fat based on height and weight. Your health care provider can find your BMI and can help you achieve or maintain a healthy weight.For adults 20 years and older:  A BMI below 18.5 is considered underweight.  A BMI of 18.5 to 24.9 is normal.  A BMI of 25 to 29.9 is considered overweight.  A BMI of 30 and above is considered obese.  Maintain normal blood lipids and cholesterol levels by exercising and minimizing your intake of saturated fat. Eat a balanced diet with plenty of fruit and vegetables. Blood tests for lipids and cholesterol should begin at age 68 and be repeated every 5 years. If your lipid or cholesterol levels are high, you are over 50, or you are at high risk for heart disease, you may need your cholesterol levels checked more frequently.Ongoing high lipid and cholesterol levels should be treated with medicines if diet and exercise are not working.  If you smoke, find out from your health care provider how to quit. If you do not use tobacco, do not start.  Lung cancer screening is recommended for adults aged 57-80 years who are at high risk for developing lung cancer because of a history of smoking. A yearly low-dose CT scan of the lungs is recommended for people who have at least a 30-pack-year history of smoking and are a current smoker or have quit within the past 15 years. A pack year of smoking is smoking an average of 1 pack of cigarettes a day for 1 year (for example: 1 pack a  day for 30 years or 2 packs a day for 15 years). Yearly screening should continue until the smoker has stopped smoking for at least 15 years. Yearly screening should be stopped for people who develop a health problem that would prevent them from having lung cancer treatment.  If you are pregnant, do not drink alcohol. If you are breastfeeding, be very cautious about drinking alcohol. If you are not pregnant and choose to drink alcohol, do  not have more than 1 drink per day. One drink is considered to be 12 ounces (355 mL) of beer, 5 ounces (148 mL) of wine, or 1.5 ounces (44 mL) of liquor.  Avoid use of street drugs. Do not share needles with anyone. Ask for help if you need support or instructions about stopping the use of drugs.  High blood pressure causes heart disease and increases the risk of stroke. Your blood pressure should be checked at least every 1 to 2 years. Ongoing high blood pressure should be treated with medicines if weight loss and exercise do not work.  If you are 19-61 years old, ask your health care provider if you should take aspirin to prevent strokes.  Diabetes screening is done by taking a blood sample to check your blood glucose level after you have not eaten for a certain period of time (fasting). If you are not overweight and you do not have risk factors for diabetes, you should be screened once every 3 years starting at age 17. If you are overweight or obese and you are 73-67 years of age, you should be screened for diabetes every year as part of your cardiovascular risk assessment.  Breast cancer screening is essential preventive care for women. You should practice "breast self-awareness." This means understanding the normal appearance and feel of your breasts and may include breast self-examination. Any changes detected, no matter how small, should be reported to a health care provider. Women in their 60s and 30s should have a clinical breast exam (CBE) by a health care provider as part of a regular health exam every 1 to 3 years. After age 81, women should have a CBE every year. Starting at age 75, women should consider having a mammogram (breast X-ray test) every year. Women who have a family history of breast cancer should talk to their health care provider about genetic screening. Women at a high risk of breast cancer should talk to their health care providers about having an MRI and a mammogram every  year.  Breast cancer gene (BRCA)-related cancer risk assessment is recommended for women who have family members with BRCA-related cancers. BRCA-related cancers include breast, ovarian, tubal, and peritoneal cancers. Having family members with these cancers may be associated with an increased risk for harmful changes (mutations) in the breast cancer genes BRCA1 and BRCA2. Results of the assessment will determine the need for genetic counseling and BRCA1 and BRCA2 testing.  Your health care provider may recommend that you be screened regularly for cancer of the pelvic organs (ovaries, uterus, and vagina). This screening involves a pelvic examination, including checking for microscopic changes to the surface of your cervix (Pap test). You may be encouraged to have this screening done every 3 years, beginning at age 31.  For women ages 42-65, health care providers may recommend pelvic exams and Pap testing every 3 years, or they may recommend the Pap and pelvic exam, combined with testing for human papilloma virus (HPV), every 5 years. Some types of HPV increase your risk  of cervical cancer. Testing for HPV may also be done on women of any age with unclear Pap test results.  Other health care providers may not recommend any screening for nonpregnant women who are considered low risk for pelvic cancer and who do not have symptoms. Ask your health care provider if a screening pelvic exam is right for you.  If you have had past treatment for cervical cancer or a condition that could lead to cancer, you need Pap tests and screening for cancer for at least 20 years after your treatment. If Pap tests have been discontinued, your risk factors (such as having a new sexual partner) need to be reassessed to determine if screening should resume. Some women have medical problems that increase the chance of getting cervical cancer. In these cases, your health care provider may recommend more frequent screening and Pap  tests.  Colorectal cancer can be detected and often prevented. Most routine colorectal cancer screening begins at the age of 5 years and continues through age 54 years. However, your health care provider may recommend screening at an earlier age if you have risk factors for colon cancer. On a yearly basis, your health care provider may provide home test kits to check for hidden blood in the stool. Use of a small camera at the end of a tube, to directly examine the colon (sigmoidoscopy or colonoscopy), can detect the earliest forms of colorectal cancer. Talk to your health care provider about this at age 84, when routine screening begins. Direct exam of the colon should be repeated every 5-10 years through age 70 years, unless early forms of precancerous polyps or small growths are found.  People who are at an increased risk for hepatitis B should be screened for this virus. You are considered at high risk for hepatitis B if:  You were born in a country where hepatitis B occurs often. Talk with your health care provider about which countries are considered high risk.  Your parents were born in a high-risk country and you have not received a shot to protect against hepatitis B (hepatitis B vaccine).  You have HIV or AIDS.  You use needles to inject street drugs.  You live with, or have sex with, someone who has hepatitis B.  You get hemodialysis treatment.  You take certain medicines for conditions like cancer, organ transplantation, and autoimmune conditions.  Hepatitis C blood testing is recommended for all people born from [birth year] through 1965 and any individual with known risks for hepatitis C.  Practice safe sex. Use condoms and avoid high-risk sexual practices to reduce the spread of sexually transmitted infections (STIs). STIs include gonorrhea, chlamydia, syphilis, trichomonas, herpes, HPV, and human immunodeficiency virus (HIV). Herpes, HIV, and HPV are viral illnesses that have no cure.  They can result in disability, cancer, and death.  You should be screened for sexually transmitted illnesses (STIs) including gonorrhea and chlamydia if:  You are sexually active and are younger than 24 years.  You are older than 24 years and your health care provider tells you that you are at risk for this type of infection.  Your sexual activity has changed since you were last screened and you are at an increased risk for chlamydia or gonorrhea. Ask your health care provider if you are at risk.  If you are at risk of being infected with HIV, it is recommended that you take a prescription medicine daily to prevent HIV infection. This is called preexposure prophylaxis (PrEP). You are  considered at risk if:  You are sexually active and do not regularly use condoms or know the HIV status of your partner(s).  You take drugs by injection.  You are sexually active with a partner who has HIV.  Talk with your health care provider about whether you are at high risk of being infected with HIV. If you choose to begin PrEP, you should first be tested for HIV. You should then be tested every 3 months for as long as you are taking PrEP.  Osteoporosis is a disease in which the bones lose minerals and strength with aging. This can result in serious bone fractures or breaks. The risk of osteoporosis can be identified using a bone density scan. Women ages 20 years and over and women at risk for fractures or osteoporosis should discuss screening with their health care providers. Ask your health care provider whether you should take a calcium supplement or vitamin D to reduce the rate of osteoporosis.  Menopause can be associated with physical symptoms and risks. Hormone replacement therapy is available to decrease symptoms and risks. You should talk to your health care provider about whether hormone replacement therapy is right for you.  Use sunscreen. Apply sunscreen liberally and repeatedly throughout the  day. You should seek shade when your shadow is shorter than you. Protect yourself by wearing long sleeves, pants, a wide-brimmed hat, and sunglasses year round, whenever you are outdoors.  Once a month, do a whole body skin exam, using a mirror to look at the skin on your back. Tell your health care provider of new moles, moles that have irregular borders, moles that are larger than a pencil eraser, or moles that have changed in shape or color.  Stay current with required vaccines (immunizations).  Influenza vaccine. All adults should be immunized every year.  Tetanus, diphtheria, and acellular pertussis (Td, Tdap) vaccine. Pregnant women should receive 1 dose of Tdap vaccine during each pregnancy. The dose should be obtained regardless of the length of time since the last dose. Immunization is preferred during the 27th-36th week of gestation. An adult who has not previously received Tdap or who does not know her vaccine status should receive 1 dose of Tdap. This initial dose should be followed by tetanus and diphtheria toxoids (Td) booster doses every 10 years. Adults with an unknown or incomplete history of completing a 3-dose immunization series with Td-containing vaccines should begin or complete a primary immunization series including a Tdap dose. Adults should receive a Td booster every 10 years.  Varicella vaccine. An adult without evidence of immunity to varicella should receive 2 doses or a second dose if she has previously received 1 dose. Pregnant females who do not have evidence of immunity should receive the first dose after pregnancy. This first dose should be obtained before leaving the health care facility. The second dose should be obtained 4-8 weeks after the first dose.  Human papillomavirus (HPV) vaccine. Females aged 13-26 years who have not received the vaccine previously should obtain the 3-dose series. The vaccine is not recommended for use in pregnant females. However, pregnancy  testing is not needed before receiving a dose. If a female is found to be pregnant after receiving a dose, no treatment is needed. In that case, the remaining doses should be delayed until after the pregnancy. Immunization is recommended for any person with an immunocompromised condition through the age of 65 years if she did not get any or all doses earlier. During the 3-dose  series, the second dose should be obtained 4-8 weeks after the first dose. The third dose should be obtained 24 weeks after the first dose and 16 weeks after the second dose.  Zoster vaccine. One dose is recommended for adults aged 78 years or older unless certain conditions are present.  Measles, mumps, and rubella (MMR) vaccine. Adults born before [birth year] generally are considered immune to measles and mumps. Adults born in [birth year] or later should have 1 or more doses of MMR vaccine unless there is a contraindication to the vaccine or there is laboratory evidence of immunity to each of the three diseases. A routine second dose of MMR vaccine should be obtained at least 28 days after the first dose for students attending postsecondary schools, health care workers, or international travelers. People who received inactivated measles vaccine or an unknown type of measles vaccine during 1963-1967 should receive 2 doses of MMR vaccine. People who received inactivated mumps vaccine or an unknown type of mumps vaccine before 1979 and are at high risk for mumps infection should consider immunization with 2 doses of MMR vaccine. For females of childbearing age, rubella immunity should be determined. If there is no evidence of immunity, females who are not pregnant should be vaccinated. If there is no evidence of immunity, females who are pregnant should delay immunization until after pregnancy. Unvaccinated health care workers born before [birth year] who lack laboratory evidence of measles, mumps, or rubella immunity or laboratory confirmation of disease should  consider measles and mumps immunization with 2 doses of MMR vaccine or rubella immunization with 1 dose of MMR vaccine.  Pneumococcal 13-valent conjugate (PCV13) vaccine. When indicated, a person who is uncertain of his immunization history and has no record of immunization should receive the PCV13 vaccine. All adults 95 years of age and older should receive this vaccine. An adult aged 21 years or older who has certain medical conditions and has not been previously immunized should receive 1 dose of PCV13 vaccine. This PCV13 should be followed with a dose of pneumococcal polysaccharide (PPSV23) vaccine. Adults who are at high risk for pneumococcal disease should obtain the PPSV23 vaccine at least 8 weeks after the dose of PCV13 vaccine. Adults older than 57 years of age who have normal immune system function should obtain the PPSV23 vaccine dose at least 1 year after the dose of PCV13 vaccine.  Pneumococcal polysaccharide (PPSV23) vaccine. When PCV13 is also indicated, PCV13 should be obtained first. All adults aged 86 years and older should be immunized. An adult younger than age 38 years who has certain medical conditions should be immunized. Any person who resides in a nursing home or long-term care facility should be immunized. An adult smoker should be immunized. People with an immunocompromised condition and certain other conditions should receive both PCV13 and PPSV23 vaccines. People with human immunodeficiency virus (HIV) infection should be immunized as soon as possible after diagnosis. Immunization during chemotherapy or radiation therapy should be avoided. Routine use of PPSV23 vaccine is not recommended for American Indians, [place] Natives, or people younger than 65 years unless there are medical conditions that require PPSV23 vaccine. When indicated, people who have unknown immunization and have no record of immunization should receive PPSV23 vaccine. One-time revaccination 5 years after the first  dose of PPSV23 is recommended for people aged 19-64 years who have chronic kidney failure, nephrotic syndrome, asplenia, or immunocompromised conditions. People who received 1-2 doses of PPSV23 before age 62 years should receive another dose of PPSV23 vaccine  at age 77 years or later if at least 5 years have passed since the previous dose. Doses of PPSV23 are not needed for people immunized with PPSV23 at or after age 64 years.  Meningococcal vaccine. Adults with asplenia or persistent complement component deficiencies should receive 2 doses of quadrivalent meningococcal conjugate (MenACWY-D) vaccine. The doses should be obtained at least 2 months apart. Microbiologists working with certain meningococcal bacteria, [employer] recruits, people at risk during an outbreak, and people who travel to or live in countries with a high rate of meningitis should be immunized. A first-year college student up through age 33 years who is living in a residence hall should receive a dose if she did not receive a dose on or after her 16th birthday. Adults who have certain high-risk conditions should receive one or more doses of vaccine.  Hepatitis A vaccine. Adults who wish to be protected from this disease, have certain high-risk conditions, work with hepatitis A-infected animals, work in hepatitis A research labs, or travel to or work in countries with a high rate of hepatitis A should be immunized. Adults who were previously unvaccinated and who anticipate close contact with an international adoptee during the first 60 days after arrival in the [place] from a country with a high rate of hepatitis A should be immunized.  Hepatitis B vaccine. Adults who wish to be protected from this disease, have certain high-risk conditions, may be exposed to blood or other infectious body fluids, are household contacts or sex partners of hepatitis B positive people, are clients or workers in certain care facilities, or travel to or  work in countries with a high rate of hepatitis B should be immunized.  Haemophilus influenzae type b (Hib) vaccine. A previously unvaccinated person with asplenia or sickle cell disease or having a scheduled splenectomy should receive 1 dose of Hib vaccine. Regardless of previous immunization, a recipient of a hematopoietic stem cell transplant should receive a 3-dose series 6-12 months after her successful transplant. Hib vaccine is not recommended for adults with HIV infection. Preventive Services / Frequency Ages 38 to 52 years  Blood pressure check.** / Every 3-5 years.  Lipid and cholesterol check.** / Every 5 years beginning at age 45.  Clinical breast exam.** / Every 3 years for women in their 30s and 65s.  BRCA-related cancer risk assessment.** / For women who have family members with a BRCA-related cancer (breast, ovarian, tubal, or peritoneal cancers).  Pap test.** / Every 2 years from ages 7 through 48. Every 3 years starting at age 68 through age 20 or 43 with a history of 3 consecutive normal Pap tests.  HPV screening.** / Every 3 years from ages 71 through ages 68 to 42 with a history of 3 consecutive normal Pap tests.  Hepatitis C blood test.** / For any individual with known risks for hepatitis C.  Skin self-exam. / Monthly.  Influenza vaccine. / Every year.  Tetanus, diphtheria, and acellular pertussis (Tdap, Td) vaccine.** / Consult your health care provider. Pregnant women should receive 1 dose of Tdap vaccine during each pregnancy. 1 dose of Td every 10 years.  Varicella vaccine.** / Consult your health care provider. Pregnant females who do not have evidence of immunity should receive the first dose after pregnancy.  HPV vaccine. / 3 doses over 6 months, if 32 and younger. The vaccine is not recommended for use in pregnant females. However, pregnancy testing is not needed before receiving a dose.  Measles, mumps, rubella (MMR)  vaccine.** / You need at least 1 dose  of MMR if you were born in 1957 or later. You may also need a 2nd dose. For females of childbearing age, rubella immunity should be determined. If there is no evidence of immunity, females who are not pregnant should be vaccinated. If there is no evidence of immunity, females who are pregnant should delay immunization until after pregnancy.  Pneumococcal 13-valent conjugate (PCV13) vaccine.** / Consult your health care provider.  Pneumococcal polysaccharide (PPSV23) vaccine.** / 1 to 2 doses if you smoke cigarettes or if you have certain conditions.  Meningococcal vaccine.** / 1 dose if you are age 6 to 12 years and a [employer] researcher living in a residence hall, or have one of several medical conditions, you need to get vaccinated against meningococcal disease. You may also need additional booster doses.  Hepatitis A vaccine.** / Consult your health care provider.  Hepatitis B vaccine.** / Consult your health care provider.  Haemophilus influenzae type b (Hib) vaccine.** / Consult your health care provider. Ages 60 to 90 years  Blood pressure check.** / Every year.  Lipid and cholesterol check.** / Every 5 years beginning at age 43 years.  Lung cancer screening. / Every year if you are aged 54-80 years and have a 30-pack-year history of smoking and currently smoke or have quit within the past 15 years. Yearly screening is stopped once you have quit smoking for at least 15 years or develop a health problem that would prevent you from having lung cancer treatment.  Clinical breast exam.** / Every year after age 30 years.  BRCA-related cancer risk assessment.** / For women who have family members with a BRCA-related cancer (breast, ovarian, tubal, or peritoneal cancers).  Mammogram.** / Every year beginning at age 31 years and continuing for as long as you are in good health. Consult with your health care provider.  Pap test.** / Every 3 years starting at age 5 years through age  81 or 80 years with a history of 3 consecutive normal Pap tests.  HPV screening.** / Every 3 years from ages 41 years through ages 8 to 18 years with a history of 3 consecutive normal Pap tests.  Fecal occult blood test (FOBT) of stool. / Every year beginning at age 26 years and continuing until age 59 years. You may not need to do this test if you get a colonoscopy every 10 years.  Flexible sigmoidoscopy or colonoscopy.** / Every 5 years for a flexible sigmoidoscopy or every 10 years for a colonoscopy beginning at age 73 years and continuing until age 70 years.  Hepatitis C blood test.** / For all people born from [birth year] through 1967 and any individual with known risks for hepatitis C.  Skin self-exam. / Monthly.  Influenza vaccine. / Every year.  Tetanus, diphtheria, and acellular pertussis (Tdap/Td) vaccine.** / Consult your health care provider. Pregnant women should receive 1 dose of Tdap vaccine during each pregnancy. 1 dose of Td every 10 years.  Varicella vaccine.** / Consult your health care provider. Pregnant females who do not have evidence of immunity should receive the first dose after pregnancy.  Zoster vaccine.** / 1 dose for adults aged 80 years or older.  Measles, mumps, rubella (MMR) vaccine.** / You need at least 1 dose of MMR if you were born in 1957 or later. You may also need a second dose. For females of childbearing age, rubella immunity should be determined. If there is no evidence of immunity, females  who are not pregnant should be vaccinated. If there is no evidence of immunity, females who are pregnant should delay immunization until after pregnancy.  Pneumococcal 13-valent conjugate (PCV13) vaccine.** / Consult your health care provider.  Pneumococcal polysaccharide (PPSV23) vaccine.** / 1 to 2 doses if you smoke cigarettes or if you have certain conditions.  Meningococcal vaccine.** / Consult your health care provider.  Hepatitis A vaccine.** / Consult your  health care provider.  Hepatitis B vaccine.** / Consult your health care provider.  Haemophilus influenzae type b (Hib) vaccine.** / Consult your health care provider. Ages 30 years and over  Blood pressure check.** / Every year.  Lipid and cholesterol check.** / Every 5 years beginning at age 57 years.  Lung cancer screening. / Every year if you are aged 62-80 years and have a 30-pack-year history of smoking and currently smoke or have quit within the past 15 years. Yearly screening is stopped once you have quit smoking for at least 15 years or develop a health problem that would prevent you from having lung cancer treatment.  Clinical breast exam.** / Every year after age 20 years.  BRCA-related cancer risk assessment.** / For women who have family members with a BRCA-related cancer (breast, ovarian, tubal, or peritoneal cancers).  Mammogram.** / Every year beginning at age 65 years and continuing for as long as you are in good health. Consult with your health care provider.  Pap test.** / Every 3 years starting at age 23 years through age 1 or 98 years with 3 consecutive normal Pap tests. Testing can be stopped between 65 and 70 years with 3 consecutive normal Pap tests and no abnormal Pap or HPV tests in the past 10 years.  HPV screening.** / Every 3 years from ages 74 years through ages 68 or 11 years with a history of 3 consecutive normal Pap tests. Testing can be stopped between 65 and 70 years with 3 consecutive normal Pap tests and no abnormal Pap or HPV tests in the past 10 years.  Fecal occult blood test (FOBT) of stool. / Every year beginning at age 3 years and continuing until age 43 years. You may not need to do this test if you get a colonoscopy every 10 years.  Flexible sigmoidoscopy or colonoscopy.** / Every 5 years for a flexible sigmoidoscopy or every 10 years for a colonoscopy beginning at age 50 years and continuing until age 93 years.  Hepatitis C blood test.** /  For all people born from [birth year] through 1967 and any individual with known risks for hepatitis C.  Osteoporosis screening.** / A one-time screening for women ages 60 years and over and women at risk for fractures or osteoporosis.  Skin self-exam. / Monthly.  Influenza vaccine. / Every year.  Tetanus, diphtheria, and acellular pertussis (Tdap/Td) vaccine.** / 1 dose of Td every 10 years.  Varicella vaccine.** / Consult your health care provider.  Zoster vaccine.** / 1 dose for adults aged 68 years or older.  Pneumococcal 13-valent conjugate (PCV13) vaccine.** / Consult your health care provider.  Pneumococcal polysaccharide (PPSV23) vaccine.** / 1 dose for all adults aged 52 years and older.  Meningococcal vaccine.** / Consult your health care provider.  Hepatitis A vaccine.** / Consult your health care provider.  Hepatitis B vaccine.** / Consult your health care provider.  Haemophilus influenzae type b (Hib) vaccine.** / Consult your health care provider. ** Family history and personal history of risk and conditions may change your health care provider's recommendations.  This information is not intended to replace advice given to you by your health care provider. Make sure you discuss any questions you have with your health care provider.   Document Released: 01/22/2002 Document Revised: 12/17/2014 Document Reviewed: 04/23/2011 Elsevier Interactive Patient Education [employer].

## 2015-12-20 NOTE — Telephone Encounter (Signed)
Again that is great that her mother has medication that she does not need or cannot use but will not change the price of Lyrica going forward. Also she will be in pain while waiting for medication to be sent from her mother. I would recommend that she get the Gabapentin and take as directed as typically this is the more cost-effective medication out of the two

## 2015-12-20 NOTE — Telephone Encounter (Signed)
Please call patient to assess -- Lyrica was not given. Rx Gabapentin to take twice daily was sent in.

## 2015-12-20 NOTE — Addendum Note (Signed)
Addended by: Raiford Noble on: 12/20/2015 03:19 PM   Modules accepted: Miquel Dunn

## 2015-12-20 NOTE — Assessment & Plan Note (Signed)
Otherwise asymptomatic. Is not sexually active. Declines pelvic exam. Will obtain urine ancillary testing for vaginal infections. Will treat based on results.

## 2015-12-20 NOTE — Assessment & Plan Note (Signed)
Depression screen negative. Health Maintenance reviewed -- Declines immunizations. Order for screening mammogram placed. Referral to GI for screening colonoscopy placed.. Preventive schedule discussed and handout given in AVS. Will obtain fasting labs today.

## 2015-12-20 NOTE — Progress Notes (Signed)
Patient presents to clinic today for annual exam.  Patient is fasting for labs. Patient denies acute concerns today. Was seen yesterday for cervical radiculopathy and imaging was obtained. Would like to discuss results in more detail.  Health Maintenance: Immunizations -- flu shot overdue. Is unsure of Tetanus. Declines today. Colonoscopy -- Referral to GI placed for screening colonoscopy. Patient at average risk. Mammogram -- Is overdue for mammogram. Denies family history of breast cancer.  PAP -- s/p hysterectomy.  Past Medical History  Diagnosis Date  . Gallstones   . Bronchitis     hx of  . Migraine     hx of  . Arthritis     bilateral knees  . Anemia   . Fibroid tumor     Had partial hysterectomy    Past Surgical History  Procedure Laterality Date  . Abdominal hysterectomy      partial   . Tubal ligation    . Boil removed     . Cholecystectomy  Oct 2012  . Total knee arthroplasty  08/01/2012    Procedure: TOTAL KNEE ARTHROPLASTY;  Surgeon: Augustin Schooling, MD;  Location: [facility];  Service: Orthopedics;  Laterality: Right;  RIGHT TOTAL KNEE ARTHROPLASTY  . Wisdom tooth extraction    . Hand surgery  04-15-15  . Elbow surgery  04-15-15  . Carpal tunnel release  04-15-15  . Trigger finger release  04-15-15  . Nerve damage  04-15-15    Current Outpatient Prescriptions on File Prior to Visit  Medication Sig Dispense Refill  . clobetasol cream (TEMOVATE) [id number] % Apply 1 application topically 2 (two) times daily. 30 g 0  . cyclobenzaprine (FLEXERIL) 10 MG tablet Take 1 tablet (10 mg total) by mouth at bedtime. 30 tablet 0  . Echinacea-Goldenseal (ECHINACEA COMB/GOLDEN SEAL PO) Take by mouth.    Marland Kitchen GLUCOSAMINE PO Take 1 tablet by mouth 2 (two) times daily.    Marland Kitchen ibuprofen (ADVIL,MOTRIN) 200 MG tablet Take 200 mg by mouth every 6 (six) hours as needed. Reported on 12/19/2015    . oxyCODONE-acetaminophen (PERCOCET) 10-325 MG tablet Take 1 tablet by mouth every 8 (eight) hours as needed  for pain. 90 tablet 0  . vitamin B-12 (CYANOCOBALAMIN) 100 MCG tablet Take 100 mcg by mouth daily.    . vitamin C (ASCORBIC ACID) 500 MG tablet Take 1,000 mg by mouth daily.    . vitamin E 400 UNIT capsule Take 400 Units by mouth daily.     No current facility-administered medications on file prior to visit.    Allergies  Allergen Reactions  . Penicillins     Hives   . Tramadol Itching    Family History  Problem Relation Age of Onset  . Diabetes Father 64    Deceased  . Diabetes Mother     Living  . Diabetes Brother   . Heart disease Maternal Aunt   . Hyperlipidemia Mother   . Tuberculosis Mother   . Arthritis Other     Maternal Aunts & Uncles  . Heart disease Maternal Uncle   . Asthma Mother   . Diabetes Sister   . Kidney failure Brother 30    Diseased  . Healthy Son     x1    Social History   Social History  . Marital Status: Single    Spouse Name: N/A  . Number of Children: N/A  . Years of Education: N/A   Occupational History  . Not on file.   Social  History Main Topics  . Smoking status: Former Smoker -- 0.25 packs/day for 20 years    Types: Cigarettes  . Smokeless tobacco: Never Used  . Alcohol Use: Yes     Comment: social  . Drug Use: No  . Sexual Activity: Not Currently   Other Topics Concern  . Not on file   Social History Narrative   Review of Systems  Constitutional: Negative for fever and weight loss.  HENT: Negative for ear discharge, ear pain, hearing loss and tinnitus.   Eyes: Negative for blurred vision, double vision, photophobia and pain.  Respiratory: Negative for cough and shortness of breath.   Cardiovascular: Negative for chest pain and palpitations.  Gastrointestinal: Negative for heartburn, nausea, vomiting, abdominal pain, diarrhea, constipation, blood in stool and melena.  Genitourinary: Negative for dysuria, urgency, frequency, hematuria and flank pain.  Musculoskeletal: Positive for joint pain and neck pain. Negative for  falls.  Neurological: Negative for dizziness, loss of consciousness and headaches.  Endo/Heme/Allergies: Negative for environmental allergies.  Psychiatric/Behavioral: Negative for depression, suicidal ideas, hallucinations and substance abuse. The patient is not nervous/anxious and does not have insomnia.    BP 122/80 mmHg  Pulse 84  Temp(Src) 98.1 F (36.7 C) (Oral)  Ht 5' 5.5" (1.664 m)  Wt 213 lb 6.4 oz (96.798 kg)  BMI 34.96 kg/m2  SpO2 98%  Physical Exam  Constitutional: She is oriented to person, place, and time and well-developed, well-nourished, and in no distress.  HENT:  Head: Normocephalic and atraumatic.  Right Ear: Tympanic membrane, external ear and ear canal normal.  Left Ear: Tympanic membrane, external ear and ear canal normal.  Nose: Nose normal. No mucosal edema.  Mouth/Throat: Uvula is midline, oropharynx is clear and moist and mucous membranes are normal. No oropharyngeal exudate or posterior oropharyngeal erythema.  Eyes: Conjunctivae are normal. Pupils are equal, round, and reactive to light.  Neck: Neck supple. No thyromegaly present.  Cardiovascular: Normal rate, regular rhythm, normal heart sounds and intact distal pulses.   Pulmonary/Chest: Effort normal and breath sounds normal. No respiratory distress. She has no wheezes. She has no rales.  Abdominal: Soft. Bowel sounds are normal. She exhibits no distension and no mass. There is no tenderness. There is no rebound and no guarding.  Lymphadenopathy:    She has no cervical adenopathy.  Neurological: She is alert and oriented to person, place, and time. No cranial nerve deficit.  Skin: Skin is warm and dry. No rash noted.  Psychiatric: Affect normal.  Vitals reviewed.  Assessment/Plan: Vaginal discharge Otherwise asymptomatic. Is not sexually active. Declines pelvic exam. Will obtain urine ancillary testing for vaginal infections. Will treat based on results.  Visit for preventive health  examination Depression screen negative. Health Maintenance reviewed -- Declines immunizations. Order for screening mammogram placed. Referral to GI for screening colonoscopy placed.. Preventive schedule discussed and handout given in AVS. Will obtain fasting labs today.   Colon cancer screening Referral to GI for screening colonoscopy placed.  Cervical radiculopathy X-ray results reviewed. Will add-on Gabapentin 100 mg BID and refer to Neurosurgery. Continue other medications as directed.  Breast cancer screening Order for screening mammogram placed.

## 2015-12-20 NOTE — Progress Notes (Signed)
Pre visit review using our clinic review tool, if applicable. No additional management support is needed unless otherwise documented below in the visit note. 

## 2015-12-20 NOTE — Telephone Encounter (Signed)
Called and spoke with the pt and informed her of the note below.  Pt verbalized understanding.  Pt stated that she and [name] had talked about the Lyrica, and she was calling back to say her mother has Lyrica 75mg  #90 and she can send them to her.  Please advise.//AB/CMA

## 2015-12-20 NOTE — Telephone Encounter (Signed)
Caller name: Self   Can be reached: [phone]   Reason for call: patient request call back about Lyrica that was given to her by [facility]. Patient is at pharmacy now and has a question.

## 2015-12-21 LAB — CBC
HEMATOCRIT: 37.4 % (ref 36.0–46.0)
HEMOGLOBIN: 12.1 g/dL (ref 12.0–15.0)
MCHC: 32.5 g/dL (ref 30.0–36.0)
MCV: 89.1 fl (ref 78.0–100.0)
PLATELETS: 292 10*3/uL (ref 150.0–400.0)
RBC: 4.19 Mil/uL (ref 3.87–5.11)
RDW: 13.5 % (ref 11.5–15.5)
WBC: 5.7 10*3/uL (ref 4.0–10.5)

## 2015-12-21 LAB — COMPREHENSIVE METABOLIC PANEL
ALBUMIN: 4.3 g/dL (ref 3.5–5.2)
ALT: 16 U/L (ref 0–35)
AST: 15 U/L (ref 0–37)
Alkaline Phosphatase: 104 U/L (ref 39–117)
BUN: 16 mg/dL (ref 6–23)
CALCIUM: 9.9 mg/dL (ref 8.4–10.5)
CHLORIDE: 104 meq/L (ref 96–112)
CO2: 31 mEq/L (ref 19–32)
CREATININE: 0.76 mg/dL (ref 0.40–1.20)
GFR: 101.17 mL/min (ref >60.00)
Glucose, Bld: 103 mg/dL — ABNORMAL HIGH (ref 70–99)
Potassium: 3.9 mEq/L (ref 3.5–5.1)
Sodium: 140 mEq/L (ref 135–145)
Total Bilirubin: 0.3 mg/dL (ref 0.2–1.2)
Total Protein: 7.2 g/dL (ref 6.0–8.3)

## 2015-12-21 LAB — LIPID PANEL
Cholesterol: 191 mg/dL (ref 0–200)
HDL: 66.3 mg/dL (ref >39.00)
LDL Cholesterol: 107 mg/dL — ABNORMAL HIGH (ref 0–99)
NONHDL: 124.44
Total CHOL/HDL Ratio: 3
Triglycerides: 85 mg/dL (ref 0.0–149.0)
VLDL: 17 mg/dL (ref 0.0–40.0)

## 2015-12-21 LAB — HEMOGLOBIN A1C: HEMOGLOBIN A1C: 7.1 % — AB (ref 4.6–6.5)

## 2015-12-21 LAB — TSH: TSH: 0.86 u[IU]/mL (ref 0.35–4.50)

## 2015-12-21 NOTE — Telephone Encounter (Signed)
Called and spoke with the pt and informed her of the note below.  Pt verbalized understanding and agreed.//AB/CMA 

## 2015-12-22 ENCOUNTER — Encounter: Payer: Self-pay | Admitting: Gastroenterology

## 2015-12-22 LAB — URINE CYTOLOGY ANCILLARY ONLY
Chlamydia: NEGATIVE
Neisseria Gonorrhea: NEGATIVE
TRICH (WINDOWPATH): POSITIVE — AB

## 2015-12-23 ENCOUNTER — Other Ambulatory Visit: Payer: Self-pay | Admitting: Physician Assistant

## 2015-12-23 DIAGNOSIS — A5901 Trichomonal vulvovaginitis: Secondary | ICD-10-CM

## 2015-12-23 MED ORDER — TINIDAZOLE 500 MG PO TABS: 2.0000 g | ORAL_TABLET | Freq: Once | ORAL | Status: DC

## 2015-12-25 ENCOUNTER — Other Ambulatory Visit: Payer: Self-pay | Admitting: Physician Assistant

## 2015-12-29 ENCOUNTER — Other Ambulatory Visit: Payer: Self-pay

## 2015-12-29 ENCOUNTER — Telehealth: Payer: Self-pay

## 2015-12-29 DIAGNOSIS — G5620 Lesion of ulnar nerve, unspecified upper limb: Secondary | ICD-10-CM | POA: Insufficient documentation

## 2015-12-29 DIAGNOSIS — G5600 Carpal tunnel syndrome, unspecified upper limb: Secondary | ICD-10-CM | POA: Insufficient documentation

## 2015-12-29 DIAGNOSIS — G5601 Carpal tunnel syndrome, right upper limb: Secondary | ICD-10-CM | POA: Insufficient documentation

## 2015-12-29 DIAGNOSIS — Z1231 Encounter for screening mammogram for malignant neoplasm of breast: Secondary | ICD-10-CM

## 2015-12-29 DIAGNOSIS — G5621 Lesion of ulnar nerve, right upper limb: Secondary | ICD-10-CM | POA: Insufficient documentation

## 2015-12-29 HISTORY — DX: Carpal tunnel syndrome, unspecified upper limb: G56.00

## 2015-12-29 HISTORY — DX: Lesion of ulnar nerve, unspecified upper limb: G56.20

## 2015-12-29 LAB — URINE CYTOLOGY ANCILLARY ONLY: CANDIDA VAGINITIS: NEGATIVE

## 2015-12-29 MED ORDER — METRONIDAZOLE 0.75 % VA GEL: 1.0000 | Freq: Every day | VAGINAL | Status: DC

## 2015-12-29 NOTE — Telephone Encounter (Signed)
During call regarding lab work, pt stated that she is being released back to work by [employer].  However due to [employer] findings regarding neck and shoulder, she needs a doctor's note saying that it's okay for her to stay out of work.    Please advise.

## 2015-12-30 ENCOUNTER — Encounter: Payer: Self-pay | Admitting: Physician Assistant

## 2015-12-30 NOTE — Telephone Encounter (Signed)
I have written the letter. It is at the front desk for her to pick up.

## 2015-12-30 NOTE — Telephone Encounter (Signed)
Pt called back and was made aware that letter is ready for pick up.  No further questions or concerns voiced.

## 2015-12-30 NOTE — Telephone Encounter (Signed)
Called to make patient aware.  Left message for call back.

## 2016-01-05 ENCOUNTER — Ambulatory Visit: Payer: BLUE CROSS/BLUE SHIELD

## 2016-01-23 ENCOUNTER — Encounter: Payer: Self-pay | Admitting: Medical

## 2016-01-23 ENCOUNTER — Ambulatory Visit (INDEPENDENT_AMBULATORY_CARE_PROVIDER_SITE_OTHER): Payer: BLUE CROSS/BLUE SHIELD | Admitting: Medical

## 2016-01-23 VITALS — BP 122/82 | HR 88 | Temp 98.1°F | Ht 65.5 in | Wt 213.2 lb

## 2016-01-23 DIAGNOSIS — J309 Allergic rhinitis, unspecified: Secondary | ICD-10-CM

## 2016-01-23 MED ORDER — AZITHROMYCIN 250 MG PO TABS: ORAL_TABLET | ORAL | Status: DC

## 2016-01-23 MED ORDER — FLUTICASONE PROPIONATE 50 MCG/ACT NA SUSP: 2.0000 | Freq: Every day | NASAL | Status: DC

## 2016-01-23 MED ORDER — LORATADINE 10 MG PO TABS
10.0000 mg | ORAL_TABLET | Freq: Every day | ORAL | Status: DC
Start: 2016-01-23 — End: 2016-05-29

## 2016-01-23 MED ORDER — BENZONATATE 100 MG PO CAPS: 100.0000 mg | ORAL_CAPSULE | Freq: Three times a day (TID) | ORAL | Status: DC | PRN

## 2016-01-23 NOTE — Patient Instructions (Signed)
You appear to have allergic rhinitis signs and symptoms. I am prescribing flonase nasal spray and claritin.   If your symptoms worsen such as st, sinus pain or chest congestion then start azithromycin. This is sent to your pharmacy to use if needed.  Follow up in 7 days or as needed

## 2016-01-23 NOTE — Progress Notes (Signed)
Pre visit review using our clinic review tool, if applicable. No additional management support is needed unless otherwise documented below in the visit note. 
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
## 2016-01-31 ENCOUNTER — Ambulatory Visit
Admission: RE | Admit: 2016-01-31 | Discharge: 2016-01-31 | Disposition: A | Discharge status: Home / Self Care | Payer: BLUE CROSS/BLUE SHIELD | Source: Ambulatory Visit

## 2016-01-31 DIAGNOSIS — Z1231 Encounter for screening mammogram for malignant neoplasm of breast: Secondary | ICD-10-CM

## 2016-02-01 ENCOUNTER — Ambulatory Visit (AMBULATORY_SURGERY_CENTER): Payer: Self-pay | Admitting: *Deleted

## 2016-02-01 VITALS — Ht 66.0 in | Wt 214.0 lb

## 2016-02-01 DIAGNOSIS — Z1211 Encounter for screening for malignant neoplasm of colon: Secondary | ICD-10-CM

## 2016-02-01 MED ORDER — NA SULFATE-K SULFATE-MG SULF 17.5-3.13-1.6 GM/177ML PO SOLN: ORAL | Status: DC

## 2016-02-01 NOTE — Progress Notes (Signed)
Patient denies any allergies to eggs or soy. Pt states she "used to be allergic to eggs,  Grew out it". Patient denies any problems with anesthesia/sedation. Patient denies any oxygen use at home and does not take any diet/weight loss medications.

## 2016-02-06 ENCOUNTER — Telehealth: Payer: Self-pay | Admitting: Gastroenterology

## 2016-02-06 NOTE — Telephone Encounter (Signed)
Informed patient we do not normally do PA on prep kits because they are usually not covered but we do get free samples of preps in the office every couple of weeks if she can pick one up. Pt verbalized understanding. Left a prep at the front desk for patient to pick up.

## 2016-02-13 ENCOUNTER — Ambulatory Visit (AMBULATORY_SURGERY_CENTER): Payer: BLUE CROSS/BLUE SHIELD | Admitting: Gastroenterology

## 2016-02-13 ENCOUNTER — Encounter: Payer: Self-pay | Admitting: Gastroenterology

## 2016-02-13 VITALS — BP 130/76 | HR 77 | Temp 97.5°F | Resp 19 | Ht 66.0 in | Wt 214.0 lb

## 2016-02-13 DIAGNOSIS — Z1211 Encounter for screening for malignant neoplasm of colon: Secondary | ICD-10-CM

## 2016-02-13 MED ORDER — SODIUM CHLORIDE 0.9 % IV SOLN: 500.0000 mL | INTRAVENOUS | Status: DC

## 2016-02-13 NOTE — Op Note (Signed)
[facility]  [address]. [place], [id number]   COLONOSCOPY PROCEDURE REPORT  PATIENT: [name], [name]  MR#: [id number] BIRTHDATE: January 13, 1961 , 54  yrs. old GENDER: female ENDOSCOPIST: Ladene Artist, MD, [facility] REFERRED BY:  Elyn Aquas, PA-C PROCEDURE DATE:  02/13/2016 PROCEDURE:   Colonoscopy, screening First Screening Colonoscopy - Avg.  risk and is 50 yrs.  old or older Yes.  Prior Negative Screening - Now for repeat screening. N/A  History of Adenoma - Now for follow-up colonoscopy & has been > or = to 3 yrs.  N/A  Polyps removed today? No Recommend repeat exam, <10 yrs? No ASA CLASS:   Class II INDICATIONS:Screening for colonic neoplasia and Colorectal Neoplasm Risk Assessment for this procedure is average risk. MEDICATIONS: Monitored anesthesia care and Propofol 220 mg IV  DESCRIPTION OF PROCEDURE:   After the risks benefits and alternatives of the procedure were thoroughly explained, informed consent was obtained.  The digital rectal exam revealed no abnormalities of the rectum.   The [id number]  endoscope was introduced through the anus and advanced to the cecum, which was identified by both the appendix and ileocecal valve. No adverse events experienced.   The quality of the prep was excellent. (Suprep was used)  The instrument was then slowly withdrawn as the colon was fully examined. Estimated blood loss is zero unless otherwise noted in this procedure report.   COLON FINDINGS: A normal appearing cecum, ileocecal valve, and appendiceal orifice were identified.  The ascending, transverse, descending, sigmoid colon, and rectum appeared unremarkable. Retroflexed views revealed no abnormalities. The time to cecum = 2.9 Withdrawal time = 8.1   The scope was withdrawn and the procedure completed. COMPLICATIONS: There were no immediate complications.  ENDOSCOPIC IMPRESSION: Normal colonoscopy  RECOMMENDATIONS: Continue current colorectal  screening recommendations for "routine risk" patients with a repeat colonoscopy in 10 years.  eSigned:  Ladene Artist, MD, [facility] 02/13/2016 11:44 AM

## 2016-02-13 NOTE — Patient Instructions (Signed)
Normal colon exam today. Repeat colonoscopy in 10 years. Resume current medications. Thank you!   YOU HAD AN ENDOSCOPIC PROCEDURE TODAY AT [facility]:   Refer to the procedure report that was given to you for any specific questions about what was found during the examination.  If the procedure report does not answer your questions, please call your gastroenterologist to clarify.  If you requested that your care partner not be given the details of your procedure findings, then the procedure report has been included in a sealed envelope for you to review at your convenience later.  YOU SHOULD EXPECT: Some feelings of bloating in the abdomen. Passage of more gas than usual.  Walking can help get rid of the air that was put into your GI tract during the procedure and reduce the bloating. If you had a lower endoscopy (such as a colonoscopy or flexible sigmoidoscopy) you may notice spotting of blood in your stool or on the toilet paper. If you underwent a bowel prep for your procedure, you may not have a normal bowel movement for a few days.  Please Note:  You might notice some irritation and congestion in your nose or some drainage.  This is from the oxygen used during your procedure.  There is no need for concern and it should clear up in a day or so.  SYMPTOMS TO REPORT IMMEDIATELY:   Following lower endoscopy (colonoscopy or flexible sigmoidoscopy):  Excessive amounts of blood in the stool  Significant tenderness or worsening of abdominal pains  Swelling of the abdomen that is new, acute  Fever of 100F or higher   For urgent or emergent issues, a gastroenterologist can be reached at any hour by calling [phone].   DIET: Your first meal following the procedure should be a small meal and then it is ok to progress to your normal diet. Heavy or fried foods are harder to digest and may make you feel nauseous or bloated.  Likewise, meals heavy in dairy and vegetables can  increase bloating.  Drink plenty of fluids but you should avoid alcoholic beverages for 24 hours.  ACTIVITY:  You should plan to take it easy for the rest of today and you should NOT DRIVE or use heavy machinery until tomorrow (because of the sedation medicines used during the test).    FOLLOW UP: Our staff will call the number listed on your records the next business day following your procedure to check on you and address any questions or concerns that you may have regarding the information given to you following your procedure. If we do not reach you, we will leave a message.  However, if you are feeling well and you are not experiencing any problems, there is no need to return our call.  We will assume that you have returned to your regular daily activities without incident.  If any biopsies were taken you will be contacted by phone or by letter within the next 1-3 weeks.  Please call us at [phone] if you have not heard about the biopsies in 3 weeks.    SIGNATURES/CONFIDENTIALITY: You and/or your care partner have signed paperwork which will be entered into your electronic medical record.  These signatures attest to the fact that that the information above on your After Visit Summary has been reviewed and is understood.  Full responsibility of the confidentiality of this discharge information lies with you and/or your care-partner.

## 2016-02-13 NOTE — Progress Notes (Signed)
Report to PACU, RN, vss, BBS= Clear.  

## 2016-02-14 ENCOUNTER — Telehealth: Payer: Self-pay

## 2016-02-14 NOTE — Telephone Encounter (Signed)
  Follow up Call-  Call back number 02/13/2016  Post procedure Call Back phone  # [phone] cell  Permission to leave phone message Yes     Patient questions:  Do you have a fever, pain , or abdominal swelling? No. Pain Score  0 *  Have you tolerated food without any problems? Yes.    Have you been able to return to your normal activities? Yes.    Do you have any questions about your discharge instructions: Diet   No. Medications  No. Follow up visit  No.  Do you have questions or concerns about your Care? No.  Actions: * If pain score is 4 or above: No action needed, pain <4.

## 2016-02-21 ENCOUNTER — Telehealth: Payer: Self-pay | Admitting: *Deleted

## 2016-02-21 DIAGNOSIS — M5412 Radiculopathy, cervical region: Secondary | ICD-10-CM

## 2016-02-21 MED ORDER — GABAPENTIN 100 MG PO CAPS
100.0000 mg | ORAL_CAPSULE | Freq: Two times a day (BID) | ORAL | Status: DC
Start: 2016-02-21 — End: 2017-01-25

## 2016-02-21 NOTE — Telephone Encounter (Signed)
Requesting Gabapentin 100mg -Take 1 capsule by  Mouth 2 times daily. Last refill:#60,3 Last OV:01/23/16 Requesting 90 day prescription Please advise.//AB/CMA

## 2016-02-21 NOTE — Telephone Encounter (Signed)
Refill sent in

## 2016-03-02 ENCOUNTER — Other Ambulatory Visit: Payer: Self-pay | Admitting: Physician Assistant

## 2016-03-02 ENCOUNTER — Encounter: Payer: Self-pay | Admitting: Physician Assistant

## 2016-03-02 ENCOUNTER — Ambulatory Visit (INDEPENDENT_AMBULATORY_CARE_PROVIDER_SITE_OTHER): Payer: BLUE CROSS/BLUE SHIELD | Admitting: Physician Assistant

## 2016-03-02 VITALS — BP 130/90 | HR 94 | Temp 98.4°F | Ht 65.5 in | Wt 206.0 lb

## 2016-03-02 DIAGNOSIS — E119 Type 2 diabetes mellitus without complications: Secondary | ICD-10-CM | POA: Diagnosis not present

## 2016-03-02 DIAGNOSIS — R350 Frequency of micturition: Secondary | ICD-10-CM

## 2016-03-02 DIAGNOSIS — R7301 Impaired fasting glucose: Secondary | ICD-10-CM

## 2016-03-02 DIAGNOSIS — R3915 Urgency of urination: Secondary | ICD-10-CM

## 2016-03-02 LAB — POC URINALSYSI DIPSTICK (AUTOMATED)
BILIRUBIN UA: NEGATIVE
LEUKOCYTES UA: NEGATIVE
Nitrite, UA: NEGATIVE
Protein, UA: NEGATIVE
SPEC GRAV UA: 1.015
Urobilinogen, UA: 0.2
pH, UA: 6

## 2016-03-02 LAB — CBC
HEMATOCRIT: 40.1 % (ref 36.0–46.0)
HEMOGLOBIN: 13 g/dL (ref 12.0–15.0)
MCH: 29 pg (ref 26.0–34.0)
MCHC: 32.4 g/dL (ref 30.0–36.0)
MCV: 89.3 fL (ref 78.0–100.0)
MPV: 11.3 fL (ref 8.6–12.4)
Platelets: 277 10*3/uL (ref 150–400)
RBC: 4.49 MIL/uL (ref 3.87–5.11)
RDW: 13.2 % (ref 11.5–15.5)
WBC: 7 10*3/uL (ref 4.0–10.5)

## 2016-03-02 LAB — POCT CBG (FASTING - GLUCOSE)-MANUAL ENTRY: Glucose Fasting, POC: 456 mg/dL — AB (ref 70–99)

## 2016-03-02 MED ORDER — NON FORMULARY
6.0000 [IU] | Freq: Once | Status: AC
  Administered 2016-03-02: 6 [IU] via SUBCUTANEOUS

## 2016-03-02 NOTE — Progress Notes (Signed)
Pre visit review using our clinic review tool, if applicable. No additional management support is needed unless otherwise documented below in the visit note. 

## 2016-03-02 NOTE — Patient Instructions (Signed)
Please start the Tyler Aas this evening about 2 hours before bed. Take 10 units. You will take this medication once daily at the same time each evening.   Increase by 2 units each day over the weekend as long as your sugars are > 200. If lower, stay at the same dose. If < 80 stop insulin. Stay well hydrated.  Follow-up with me on Monday. If sugar is > 500 with medication, please go to the ER

## 2016-03-03 LAB — RENAL FUNCTION PANEL
Albumin: 4.3 g/dL (ref 3.6–5.1)
BUN: 14 mg/dL (ref 7–25)
CALCIUM: 9.7 mg/dL (ref 8.6–10.4)
CO2: 23 mmol/L (ref 20–31)
Chloride: 93 mmol/L — ABNORMAL LOW (ref 98–110)
Creat: 0.82 mg/dL (ref 0.50–1.05)
Glucose, Bld: 440 mg/dL — ABNORMAL HIGH (ref 65–99)
PHOSPHORUS: 3.4 mg/dL (ref 2.5–4.5)
POTASSIUM: 4.3 mmol/L (ref 3.5–5.3)
Sodium: 134 mmol/L — ABNORMAL LOW (ref 135–146)

## 2016-03-04 DIAGNOSIS — E119 Type 2 diabetes mellitus without complications: Secondary | ICD-10-CM

## 2016-03-04 HISTORY — DX: Type 2 diabetes mellitus without complications: E11.9

## 2016-03-04 NOTE — Progress Notes (Signed)
Patient presents to clinic today c/o 1.5 weeks of urinary frequency with polyuria. Denies fever, chills, vomiting, back pain, hematuria or dysuria. Was recently diagnosed with DM (A1C at 7.1 in January), thought to be related to the number of steroids she has been over the past several months. Was treating with diet and exercise since steroids have been stopped. Patient with family history of DM.  Past Medical History  Diagnosis Date  . Gallstones   . Bronchitis     hx of  . Migraine     hx of  . Arthritis     bilateral knees  . Anemia   . Fibroid tumor     Had partial hysterectomy    Current Outpatient Prescriptions on File Prior to Visit  Medication Sig Dispense Refill  . cyclobenzaprine (FLEXERIL) 10 MG tablet TAKE 1 TABLET BY MOUTH AT BEDTIME 30 tablet 0  . Echinacea-Goldenseal (ECHINACEA COMB/GOLDEN SEAL PO) Take by mouth.    . fluticasone (FLONASE) 50 MCG/ACT nasal spray Place 2 sprays into both nostrils daily. 16 g 2  . gabapentin (NEURONTIN) 100 MG capsule Take 1 capsule (100 mg total) by mouth 2 (two) times daily. 180 capsule 1  . GLUCOSAMINE PO Take 1 tablet by mouth 2 (two) times daily.    Marland Kitchen ibuprofen (ADVIL,MOTRIN) 200 MG tablet Take 200 mg by mouth every 6 (six) hours as needed. Reported on 02/13/2016    . loratadine (CLARITIN) 10 MG tablet Take 1 tablet (10 mg total) by mouth daily. 30 tablet 2  . Omega-3 1000 MG CAPS Take 1 g by mouth daily.    Marland Kitchen oxyCODONE-acetaminophen (PERCOCET) 10-325 MG tablet Take 1 tablet by mouth every 8 (eight) hours as needed for pain. 90 tablet 0  . vitamin B-12 (CYANOCOBALAMIN) 100 MCG tablet Take 100 mcg by mouth daily.    . vitamin C (ASCORBIC ACID) 500 MG tablet Take 1,000 mg by mouth daily.    . vitamin E 400 UNIT capsule Take 400 Units by mouth daily. Reported on 02/13/2016     No current facility-administered medications on file prior to visit.    Allergies  Allergen Reactions  . Penicillins Hives    Hives   . Tramadol Itching      Family History  Problem Relation Age of Onset  . Diabetes Father 38    Deceased  . Diabetes Mother     Living  . Hyperlipidemia Mother   . Tuberculosis Mother   . Asthma Mother   . Diabetes Brother   . Heart disease Maternal Aunt   . Arthritis Other     Maternal Aunts & Uncles  . Heart disease Maternal Uncle   . Colon cancer Maternal Uncle     dx in 35's  . Diabetes Sister   . Kidney failure Brother 30    Diseased  . Healthy Son     x1  . Esophageal cancer Neg Hx   . Rectal cancer Neg Hx   . Stomach cancer Neg Hx     Social History   Social History  . Marital Status: Single    Spouse Name: N/A  . Number of Children: N/A  . Years of Education: N/A   Social History Main Topics  . Smoking status: Former Smoker -- 0.25 packs/day for 20 years    Types: Cigarettes    Quit date: 12/09/2009  . Smokeless tobacco: Never Used  . Alcohol Use: Yes     Comment: social  . Drug Use: No  .  Sexual Activity: Not Currently   Other Topics Concern  . None   Social History Narrative    Review of Systems - See HPI.  All other ROS are negative.  BP 130/90 mmHg  Pulse 94  Temp(Src) 98.4 F (36.9 C) (Oral)  Ht 5' 5.5" (1.664 m)  Wt 206 lb (93.441 kg)  BMI 33.75 kg/m2  SpO2 98%  Physical Exam  Constitutional: She is oriented to person, place, and time and well-developed, well-nourished, and in no distress.  HENT:  Head: Normocephalic and atraumatic.  Eyes: Conjunctivae are normal. Pupils are equal, round, and reactive to light.  Neck: Neck supple.  Cardiovascular: Normal rate, regular rhythm, normal heart sounds and intact distal pulses.   Pulmonary/Chest: Effort normal and breath sounds normal. No respiratory distress. She has no wheezes. She has no rales. She exhibits no tenderness.  Neurological: She is alert and oriented to person, place, and time.  Skin: Skin is warm and dry. No rash noted.  Psychiatric: Affect normal.  Vitals reviewed.   Recent Results (from  the past 2160 hour(s))  Urine cytology ancillary only     Status: Abnormal   Collection Time: 12/20/15 12:00 AM  Result Value Ref Range   Chlamydia Negative     Comment: Normal Reference Range - Negative   Neisseria gonorrhea Negative     Comment: Normal Reference Range - Negative   Trichomonas **POSITIVE** (A)     Comment: Normal Reference Range - Negative  Urine cytology ancillary only     Status: Abnormal   Collection Time: 12/20/15 12:00 AM  Result Value Ref Range   Bacterial vaginitis (A)     **POSITIVE for Gardnerella vaginalis POSITIVE for Atopobium vaginae POSITIVE for Megasphaera 2**    Comment: Normal Reference Range - Negative   Candida vaginitis Negative for Candida Vaginitis Microorganisms     Comment: Normal Reference Range - Negative  CBC     Status: None   Collection Time: 12/20/15  3:32 PM  Result Value Ref Range   WBC 5.7 4.0 - 10.5 K/uL   RBC 4.19 3.87 - 5.11 Mil/uL   Platelets 292.0 150.0 - 400.0 K/uL   Hemoglobin 12.1 12.0 - 15.0 g/dL   HCT 37.4 36.0 - 46.0 %   MCV 89.1 78.0 - 100.0 fl   MCHC 32.5 30.0 - 36.0 g/dL   RDW 13.5 11.5 - 15.5 %  Comp Met (CMET)     Status: Abnormal   Collection Time: 12/20/15  3:32 PM  Result Value Ref Range   Sodium 140 135 - 145 mEq/L   Potassium 3.9 3.5 - 5.1 mEq/L   Chloride 104 96 - 112 mEq/L   CO2 31 19 - 32 mEq/L   Glucose, Bld 103 (H) 70 - 99 mg/dL   BUN 16 6 - 23 mg/dL   Creatinine, Ser 0.76 0.40 - 1.20 mg/dL   Total Bilirubin 0.3 0.2 - 1.2 mg/dL   Alkaline Phosphatase 104 39 - 117 U/L   AST 15 0 - 37 U/L   ALT 16 0 - 35 U/L   Total Protein 7.2 6.0 - 8.3 g/dL   Albumin 4.3 3.5 - 5.2 g/dL   Calcium 9.9 8.4 - 10.5 mg/dL   GFR 101.17 >60.00 mL/min  TSH     Status: None   Collection Time: 12/20/15  3:32 PM  Result Value Ref Range   TSH 0.86 0.35 - 4.50 uIU/mL  Hemoglobin A1c     Status: Abnormal   Collection Time: 12/20/15  3:32 PM  Result Value Ref Range   Hgb A1c MFr Bld 7.1 (H) 4.6 - 6.5 %    Comment:  Glycemic Control Guidelines for People with Diabetes:Non Diabetic:  <6%Goal of Therapy: <7%Additional Action Suggested:  >8%   Lipid Profile     Status: Abnormal   Collection Time: 12/20/15  3:32 PM  Result Value Ref Range   Cholesterol 191 0 - 200 mg/dL    Comment: ATP III Classification       Desirable:  < 200 mg/dL               Borderline High:  200 - 239 mg/dL          High:  > = 240 mg/dL   Triglycerides 85.0 0.0 - 149.0 mg/dL    Comment: Normal:  <150 mg/dLBorderline High:  150 - 199 mg/dL   HDL 66.30 >39.00 mg/dL   VLDL 17.0 0.0 - 40.0 mg/dL   LDL Cholesterol 107 (H) 0 - 99 mg/dL   Total CHOL/HDL Ratio 3     Comment:                Men          Women1/2 Average Risk     3.4          3.3Average Risk          5.0          4.42X Average Risk          9.6          7.13X Average Risk          15.0          11.0                       NonHDL 124.44     Comment: NOTE:  Non-HDL goal should be 30 mg/dL higher than patient's LDL goal (i.e. LDL goal of < 70 mg/dL, would have non-HDL goal of < 100 mg/dL)  POCT Urinalysis Dipstick (Automated)     Status: None   Collection Time: 03/02/16  3:24 PM  Result Value Ref Range   Color, UA Light-yellow    Clarity, UA clear    Glucose, UA 3+    Bilirubin, UA neg    Ketones, UA 2+    Spec Grav, UA 1.015    Blood, UA 3+    pH, UA 6.0    Protein, UA neg    Urobilinogen, UA 0.2    Nitrite, UA neg    Leukocytes, UA Negative Negative  POCT CBG (Fasting - Glucose)     Status: Abnormal   Collection Time: 03/02/16  3:26 PM  Result Value Ref Range   Glucose Fasting, POC 456 (A) 70 - 99 mg/dL  CULTURE, URINE COMPREHENSIVE     Status: None (Preliminary result)   Collection Time: 03/02/16  4:24 PM  Result Value Ref Range   Colony Count >=100,000 COLONIES/ML    Preliminary Report ESCHERICHIA COLI   CBC     Status: None   Collection Time: 03/02/16  4:39 PM  Result Value Ref Range   WBC 7.0 4.0 - 10.5 K/uL   RBC 4.49 3.87 - 5.11 MIL/uL   Hemoglobin 13.0  12.0 - 15.0 g/dL   HCT 40.1 36.0 - 46.0 %   MCV 89.3 78.0 - 100.0 fL   MCH 29.0 26.0 - 34.0 pg   MCHC 32.4 30.0 - 36.0 g/dL  RDW 13.2 11.5 - 15.5 %   Platelets 277 150 - 400 K/uL   MPV 11.3 8.6 - 12.4 fL  Renal Function Panel     Status: Abnormal   Collection Time: 03/02/16  4:39 PM  Result Value Ref Range   Sodium 134 (L) 135 - 146 mmol/L   Potassium 4.3 3.5 - 5.3 mmol/L   Chloride 93 (L) 98 - 110 mmol/L   CO2 23 20 - 31 mmol/L   Glucose, Bld 440 (H) 65 - 99 mg/dL    Comment: Result repeated and verified.   BUN 14 7 - 25 mg/dL   Creat 0.82 0.50 - 1.05 mg/dL   Albumin 4.3 3.6 - 5.1 g/dL   Calcium 9.7 8.6 - 10.4 mg/dL   Phosphorus 3.4 2.5 - 4.5 mg/dL    Assessment/Plan: Diabetes mellitus without complication (HCC) Urine dip with ketones and large glucose. No LE or nitrites. Will send for culture. Glucose in 450s. Patient non-fasting.  Suspect has been this way over the past few weeks. Humalog given in office (6 units) and patient monitored -- sugars decreased to 380. Will begin Tresiba nightly at 10 units. Will titrate up by 2 units nightly as long as fasting sugars > 200 until reassessment Monday. Lab panel ordered today. Too early to order repeat A1C, so fructosamine was ordered. Patient given glucometer and instructions on use reviewed. She is to check twice daily -- 1 non-fasting and 1 fasting. She is to write these down so we can review. Follow-up scheduled for Monday. Precautions reviewed on when she needs to go to ER.

## 2016-03-04 NOTE — Assessment & Plan Note (Signed)
Urine dip with ketones and large glucose. No LE or nitrites. Will send for culture. Glucose in 450s. Patient non-fasting.  Suspect has been this way over the past few weeks. Humalog given in office (6 units) and patient monitored -- sugars decreased to 380. Will begin Tresiba nightly at 10 units. Will titrate up by 2 units nightly as long as fasting sugars > 200 until reassessment Monday. Lab panel ordered today. Too early to order repeat A1C, so fructosamine was ordered. Patient given glucometer and instructions on use reviewed. She is to check twice daily -- 1 non-fasting and 1 fasting. She is to write these down so we can review. Follow-up scheduled for Monday. Precautions reviewed on when she needs to go to ER.

## 2016-03-05 ENCOUNTER — Ambulatory Visit (INDEPENDENT_AMBULATORY_CARE_PROVIDER_SITE_OTHER): Payer: BLUE CROSS/BLUE SHIELD | Admitting: Physician Assistant

## 2016-03-05 ENCOUNTER — Telehealth: Payer: Self-pay | Admitting: *Deleted

## 2016-03-05 ENCOUNTER — Encounter: Payer: Self-pay | Admitting: Physician Assistant

## 2016-03-05 VITALS — BP 130/90 | HR 97 | Temp 98.1°F | Ht 65.5 in | Wt 207.0 lb

## 2016-03-05 DIAGNOSIS — N3000 Acute cystitis without hematuria: Secondary | ICD-10-CM

## 2016-03-05 DIAGNOSIS — E119 Type 2 diabetes mellitus without complications: Secondary | ICD-10-CM

## 2016-03-05 LAB — FRUCTOSAMINE: FRUCTOSAMINE: 506 umol/L — AB (ref 190–270)

## 2016-03-05 LAB — POCT CBG (FASTING - GLUCOSE)-MANUAL ENTRY: GLUCOSE FASTING, POC: 221 mg/dL — AB (ref 70–99)

## 2016-03-05 LAB — CULTURE, URINE COMPREHENSIVE

## 2016-03-05 MED ORDER — CIPROFLOXACIN HCL 500 MG PO TABS: 500.0000 mg | ORAL_TABLET | Freq: Two times a day (BID) | ORAL | Status: DC

## 2016-03-05 MED ORDER — ONETOUCH DELICA LANCETS FINE MISC: Status: DC

## 2016-03-05 MED ORDER — GLUCOSE BLOOD VI STRP: ORAL_STRIP | Status: DC

## 2016-03-05 MED ORDER — METFORMIN HCL 500 MG PO TABS: 500.0000 mg | ORAL_TABLET | Freq: Two times a day (BID) | ORAL | Status: DC

## 2016-03-05 NOTE — Patient Instructions (Signed)
Please take the antibiotic as directed. Increase fluids.  Please follow the meal planning guide given to help stabilize sugar levels.  Continue Tyler Aas as directed -- increase by 2 more units and then stop. Take the new Metformin as directed.  Continue checking sugars and writing down. Our goal for now is < 180 fasting. Once you reach this goal give me a call. Follow-up in 2 weeks.

## 2016-03-05 NOTE — Telephone Encounter (Signed)
Noted. She had repeat Glucose in office 30 minutes after lab draw, after insulin was administered.

## 2016-03-05 NOTE — Telephone Encounter (Signed)
Call from [facility] lab.   Alert: glucose high at 440, results repeated and verified.   Results read back w/ caller and pt identity verified.

## 2016-03-05 NOTE — Progress Notes (Signed)
Pre visit review using our clinic review tool, if applicable. No additional management support is needed unless otherwise documented below in the visit note. 

## 2016-03-06 DIAGNOSIS — N3000 Acute cystitis without hematuria: Secondary | ICD-10-CM | POA: Insufficient documentation

## 2016-03-06 NOTE — Assessment & Plan Note (Signed)
Will increase Tresiba to 16 units and maintain that dose while starting Metformin 500 mg once daily for 4-5 days, then increasing to 500 mg BID. Patient to continue checking fasting sugars. Current goal is < 200. Once there we will work towards fasting goal of 100-130. She is to call once fasting sugars are < 200 so we can alter regimen further. Has follow-up scheduled in 2 weeks. Endo referral in the works.

## 2016-03-06 NOTE — Assessment & Plan Note (Signed)
Confirmed by urine culture. Rx Cipro 500 mg BID x 5 days. Supportive measures reviewed. This could be contributing to significant elevation of sugars. Will see how sugars trend with treatment.

## 2016-03-06 NOTE — Addendum Note (Signed)
Addended by: Raiford Noble on: 03/06/2016 09:06 PM   Modules accepted: Orders, SmartSet

## 2016-03-06 NOTE — Progress Notes (Signed)
Patient presents to clinic today for 3-day follow-up of significant hyperglycemia in type II diabetic. Patient endorses taking Tyler Aas as directed, increasing to 14 units daily currently. Endorses fasting sugars averaging mid 200s to 300 which is actually an improvement for sugars last week. Endorses urinary frequency has improved. Is feeling some better.   Blood work was obtained at last visit revealing stable renal function and a positive urine culture for > 100,000 colonies of e. Coli. Patient does note some mild dysuria but denies fever, chills, nausea, back pain.   Past Medical History  Diagnosis Date  . Gallstones   . Bronchitis     hx of  . Migraine     hx of  . Arthritis     bilateral knees  . Anemia   . Fibroid tumor     Had partial hysterectomy    Current Outpatient Prescriptions on File Prior to Visit  Medication Sig Dispense Refill  . cyclobenzaprine (FLEXERIL) 10 MG tablet TAKE 1 TABLET BY MOUTH AT BEDTIME 30 tablet 0  . Echinacea-Goldenseal (ECHINACEA COMB/GOLDEN SEAL PO) Take by mouth.    . fluticasone (FLONASE) 50 MCG/ACT nasal spray Place 2 sprays into both nostrils daily. 16 g 2  . gabapentin (NEURONTIN) 100 MG capsule Take 1 capsule (100 mg total) by mouth 2 (two) times daily. 180 capsule 1  . GLUCOSAMINE PO Take 1 tablet by mouth 2 (two) times daily.    Marland Kitchen ibuprofen (ADVIL,MOTRIN) 200 MG tablet Take 200 mg by mouth every 6 (six) hours as needed. Reported on 02/13/2016    . loratadine (CLARITIN) 10 MG tablet Take 1 tablet (10 mg total) by mouth daily. 30 tablet 2  . meloxicam (MOBIC) 7.5 MG tablet Take 7.5 mg by mouth every 12 (twelve) hours.  2  . Omega-3 1000 MG CAPS Take 1 g by mouth daily.    Marland Kitchen oxyCODONE-acetaminophen (PERCOCET) 10-325 MG tablet Take 1 tablet by mouth every 8 (eight) hours as needed for pain. 90 tablet 0  . vitamin B-12 (CYANOCOBALAMIN) 100 MCG tablet Take 100 mcg by mouth daily.    . vitamin C (ASCORBIC ACID) 500 MG tablet Take 1,000 mg by  mouth daily.    . vitamin E 400 UNIT capsule Take 400 Units by mouth daily. Reported on 02/13/2016     No current facility-administered medications on file prior to visit.    Allergies  Allergen Reactions  . Penicillins Hives    Hives   . Tramadol Itching    Family History  Problem Relation Age of Onset  . Diabetes Father 75    Deceased  . Diabetes Mother     Living  . Hyperlipidemia Mother   . Tuberculosis Mother   . Asthma Mother   . Diabetes Brother   . Heart disease Maternal Aunt   . Arthritis Other     Maternal Aunts & Uncles  . Heart disease Maternal Uncle   . Colon cancer Maternal Uncle     dx in 59's  . Diabetes Sister   . Kidney failure Brother 30    Diseased  . Healthy Son     x1  . Esophageal cancer Neg Hx   . Rectal cancer Neg Hx   . Stomach cancer Neg Hx     Social History   Social History  . Marital Status: Single    Spouse Name: N/A  . Number of Children: N/A  . Years of Education: N/A   Social History Main Topics  . Smoking  status: Former Smoker -- 0.25 packs/day for 20 years    Types: Cigarettes    Quit date: 12/09/2009  . Smokeless tobacco: Never Used  . Alcohol Use: Yes     Comment: social  . Drug Use: No  . Sexual Activity: Not Currently   Other Topics Concern  . None   Social History Narrative    Review of Systems - See HPI.  All other ROS are negative.  BP 130/90 mmHg  Pulse 97  Temp(Src) 98.1 F (36.7 C) (Oral)  Ht 5' 5.5" (1.664 m)  Wt 207 lb (93.895 kg)  BMI 33.91 kg/m2  SpO2 98%  Physical Exam  Constitutional: She is oriented to person, place, and time and well-developed, well-nourished, and in no distress.  HENT:  Head: Normocephalic and atraumatic.  Eyes: Conjunctivae are normal.  Neck: Neck supple.  Cardiovascular: Normal rate, regular rhythm, normal heart sounds and intact distal pulses.   Pulmonary/Chest: Effort normal and breath sounds normal. No respiratory distress. She has no wheezes. She has no rales.  She exhibits no tenderness.  Neurological: She is alert and oriented to person, place, and time.  Skin: Skin is warm and dry. No rash noted.  Psychiatric: Affect normal.  Vitals reviewed.   Recent Results (from the past 2160 hour(s))  Urine cytology ancillary only     Status: Abnormal   Collection Time: 12/20/15 12:00 AM  Result Value Ref Range   Chlamydia Negative     Comment: Normal Reference Range - Negative   Neisseria gonorrhea Negative     Comment: Normal Reference Range - Negative   Trichomonas **POSITIVE** (A)     Comment: Normal Reference Range - Negative  Urine cytology ancillary only     Status: Abnormal   Collection Time: 12/20/15 12:00 AM  Result Value Ref Range   Bacterial vaginitis (A)     **POSITIVE for Gardnerella vaginalis POSITIVE for Atopobium vaginae POSITIVE for Megasphaera 2**    Comment: Normal Reference Range - Negative   Candida vaginitis Negative for Candida Vaginitis Microorganisms     Comment: Normal Reference Range - Negative  CBC     Status: None   Collection Time: 12/20/15  3:32 PM  Result Value Ref Range   WBC 5.7 4.0 - 10.5 K/uL   RBC 4.19 3.87 - 5.11 Mil/uL   Platelets 292.0 150.0 - 400.0 K/uL   Hemoglobin 12.1 12.0 - 15.0 g/dL   HCT 37.4 36.0 - 46.0 %   MCV 89.1 78.0 - 100.0 fl   MCHC 32.5 30.0 - 36.0 g/dL   RDW 13.5 11.5 - 15.5 %  Comp Met (CMET)     Status: Abnormal   Collection Time: 12/20/15  3:32 PM  Result Value Ref Range   Sodium 140 135 - 145 mEq/L   Potassium 3.9 3.5 - 5.1 mEq/L   Chloride 104 96 - 112 mEq/L   CO2 31 19 - 32 mEq/L   Glucose, Bld 103 (H) 70 - 99 mg/dL   BUN 16 6 - 23 mg/dL   Creatinine, Ser 0.76 0.40 - 1.20 mg/dL   Total Bilirubin 0.3 0.2 - 1.2 mg/dL   Alkaline Phosphatase 104 39 - 117 U/L   AST 15 0 - 37 U/L   ALT 16 0 - 35 U/L   Total Protein 7.2 6.0 - 8.3 g/dL   Albumin 4.3 3.5 - 5.2 g/dL   Calcium 9.9 8.4 - 10.5 mg/dL   GFR 101.17 >60.00 mL/min  TSH     Status: None  Collection Time: 12/20/15   3:32 PM  Result Value Ref Range   TSH 0.86 0.35 - 4.50 uIU/mL  Hemoglobin A1c     Status: Abnormal   Collection Time: 12/20/15  3:32 PM  Result Value Ref Range   Hgb A1c MFr Bld 7.1 (H) 4.6 - 6.5 %    Comment: Glycemic Control Guidelines for People with Diabetes:Non Diabetic:  <6%Goal of Therapy: <7%Additional Action Suggested:  >8%   Lipid Profile     Status: Abnormal   Collection Time: 12/20/15  3:32 PM  Result Value Ref Range   Cholesterol 191 0 - 200 mg/dL    Comment: ATP III Classification       Desirable:  < 200 mg/dL               Borderline High:  200 - 239 mg/dL          High:  > = 240 mg/dL   Triglycerides 85.0 0.0 - 149.0 mg/dL    Comment: Normal:  <150 mg/dLBorderline High:  150 - 199 mg/dL   HDL 66.30 >39.00 mg/dL   VLDL 17.0 0.0 - 40.0 mg/dL   LDL Cholesterol 107 (H) 0 - 99 mg/dL   Total CHOL/HDL Ratio 3     Comment:                Men          Women1/2 Average Risk     3.4          3.3Average Risk          5.0          4.42X Average Risk          9.6          7.13X Average Risk          15.0          11.0                       NonHDL 124.44     Comment: NOTE:  Non-HDL goal should be 30 mg/dL higher than patient's LDL goal (i.e. LDL goal of < 70 mg/dL, would have non-HDL goal of < 100 mg/dL)  POCT Urinalysis Dipstick (Automated)     Status: None   Collection Time: 03/02/16  3:24 PM  Result Value Ref Range   Color, UA Light-yellow    Clarity, UA clear    Glucose, UA 3+    Bilirubin, UA neg    Ketones, UA 2+    Spec Grav, UA 1.015    Blood, UA 3+    pH, UA 6.0    Protein, UA neg    Urobilinogen, UA 0.2    Nitrite, UA neg    Leukocytes, UA Negative Negative  POCT CBG (Fasting - Glucose)     Status: Abnormal   Collection Time: 03/02/16  3:26 PM  Result Value Ref Range   Glucose Fasting, POC 456 (A) 70 - 99 mg/dL  CULTURE, URINE COMPREHENSIVE     Status: None   Collection Time: 03/02/16  4:24 PM  Result Value Ref Range   Culture ESCHERICHIA COLI    Colony Count  >=100,000 COLONIES/ML    Organism ID, Bacteria ESCHERICHIA COLI       Susceptibility   Escherichia coli -  (no method available)    AMPICILLIN 4 Sensitive     AMOX/CLAVULANIC <=2 Sensitive     AMPICILLIN/SULBACTAM 4 Sensitive     PIP/TAZO <=  4 Sensitive     IMIPENEM <=0.25 Sensitive     CEFAZOLIN <=4 Not Reportable     CEFTRIAXONE <=1 Sensitive     CEFTAZIDIME <=1 Sensitive     CEFEPIME <=1 Sensitive     GENTAMICIN <=1 Sensitive     TOBRAMYCIN <=1 Sensitive     CIPROFLOXACIN <=0.25 Sensitive     LEVOFLOXACIN <=0.12 Sensitive     NITROFURANTOIN <=16 Sensitive     TRIMETH/SULFA* <=20 Sensitive      * NR=NOT REPORTABLE,SEE COMMENTORAL therapy:A cefazolin MIC of <32 predicts susceptibility to the oral agents cefaclor,cefdinir,cefpodoxime,cefprozil,cefuroxime,cephalexin,and loracarbef when used for therapy of uncomplicated UTIs due to E.coli,K.pneumomiae,and P.mirabilis. PARENTERAL therapy: A cefazolinMIC of >8 indicates resistance to parenteralcefazolin. An alternate test method must beperformed to confirm susceptibility to parenteralcefazolin.  CBC     Status: None   Collection Time: 03/02/16  4:39 PM  Result Value Ref Range   WBC 7.0 4.0 - 10.5 K/uL   RBC 4.49 3.87 - 5.11 MIL/uL   Hemoglobin 13.0 12.0 - 15.0 g/dL   HCT 40.1 36.0 - 46.0 %   MCV 89.3 78.0 - 100.0 fL   MCH 29.0 26.0 - 34.0 pg   MCHC 32.4 30.0 - 36.0 g/dL   RDW 13.2 11.5 - 15.5 %   Platelets 277 150 - 400 K/uL   MPV 11.3 8.6 - 12.4 fL  Renal Function Panel     Status: Abnormal   Collection Time: 03/02/16  4:39 PM  Result Value Ref Range   Sodium 134 (L) 135 - 146 mmol/L   Potassium 4.3 3.5 - 5.3 mmol/L   Chloride 93 (L) 98 - 110 mmol/L   CO2 23 20 - 31 mmol/L   Glucose, Bld 440 (H) 65 - 99 mg/dL    Comment: Result repeated and verified.   BUN 14 7 - 25 mg/dL   Creat 0.82 0.50 - 1.05 mg/dL   Albumin 4.3 3.6 - 5.1 g/dL   Calcium 9.7 8.6 - 10.4 mg/dL   Phosphorus 3.4 2.5 - 4.5 mg/dL  Fructosamine     Status:  Abnormal   Collection Time: 03/02/16  4:40 PM  Result Value Ref Range   Fructosamine 506 (H) 190 - 270 umol/L  POCT CBG (Fasting - Glucose)     Status: Abnormal   Collection Time: 03/05/16  4:46 PM  Result Value Ref Range   Glucose Fasting, POC 221 (A) 70 - 99 mg/dL    Assessment/Plan: Acute cystitis without hematuria Confirmed by urine culture. Rx Cipro 500 mg BID x 5 days. Supportive measures reviewed. This could be contributing to significant elevation of sugars. Will see how sugars trend with treatment.  Diabetes mellitus without complication ([place]) Will increase Tresiba to 16 units and maintain that dose while starting Metformin 500 mg once daily for 4-5 days, then increasing to 500 mg BID. Patient to continue checking fasting sugars. Current goal is < 200. Once there we will work towards fasting goal of 100-130. She is to call once fasting sugars are < 200 so we can alter regimen further. Has follow-up scheduled in 2 weeks. Endo referral in the works.

## 2016-03-09 ENCOUNTER — Ambulatory Visit (INDEPENDENT_AMBULATORY_CARE_PROVIDER_SITE_OTHER): Payer: BLUE CROSS/BLUE SHIELD | Admitting: Physician Assistant

## 2016-03-09 ENCOUNTER — Encounter: Payer: Self-pay | Admitting: Physician Assistant

## 2016-03-09 VITALS — BP 124/70 | HR 92 | Temp 98.0°F | Ht 65.5 in | Wt 207.4 lb

## 2016-03-09 DIAGNOSIS — N3000 Acute cystitis without hematuria: Secondary | ICD-10-CM

## 2016-03-09 DIAGNOSIS — E119 Type 2 diabetes mellitus without complications: Secondary | ICD-10-CM | POA: Diagnosis not present

## 2016-03-09 MED ORDER — INSULIN DEGLUDEC 200 UNIT/ML ~~LOC~~ SOPN: 20.0000 [IU] | PEN_INJECTOR | Freq: Every day | SUBCUTANEOUS | Status: DC

## 2016-03-09 MED ORDER — INSULIN PEN NEEDLE 32G X 6 MM MISC: Status: DC

## 2016-03-09 NOTE — Patient Instructions (Signed)
Keep up with the good diet. Continue checking fasting sugars as directed. Our goal is between 80 -130 fasting glucose.  Continue 18 units for 2 more days. Then increase to 20 units if fasting sugars > 130.  We will increase by 2 units every 3 days until sugars consistently 80-130.  If sugars 60-80, stay hydrated and eat and decrease units by 4 units and call the office.  Continue Metformin.  Follow-up with me in 2 weeks.  Return sooner if needed.  We will work on getting a meter for you that insurance covers

## 2016-03-09 NOTE — Progress Notes (Signed)
Pre visit review using our clinic review tool, if applicable. No additional management support is needed unless otherwise documented below in the visit note. 

## 2016-03-11 NOTE — Progress Notes (Signed)
Patient presents to clinic today for follow-up of Diabetes Mellitus II, currently uncontrolled with recent A1C at 12.4. Patient has continued working on her diet. Is taking Tyler Aas (currently at 18 units) and Metformin as directed. Denies side effects of medication. Endorses sugars are currently averaging around 250 fasting. Endorses urinary frequency has resolved. Is taking Cipro as directed for UTI noted on recent urine culture.   Past Medical History  Diagnosis Date  . Gallstones   . Bronchitis     hx of  . Migraine     hx of  . Arthritis     bilateral knees  . Anemia   . Fibroid tumor     Had partial hysterectomy    Current Outpatient Prescriptions on File Prior to Visit  Medication Sig Dispense Refill  . ciprofloxacin (CIPRO) 500 MG tablet Take 1 tablet (500 mg total) by mouth 2 (two) times daily. 20 tablet 0  . cyclobenzaprine (FLEXERIL) 10 MG tablet TAKE 1 TABLET BY MOUTH AT BEDTIME 30 tablet 0  . Echinacea-Goldenseal (ECHINACEA COMB/GOLDEN SEAL PO) Take by mouth.    . fluticasone (FLONASE) 50 MCG/ACT nasal spray Place 2 sprays into both nostrils daily. 16 g 2  . gabapentin (NEURONTIN) 100 MG capsule Take 1 capsule (100 mg total) by mouth 2 (two) times daily. 180 capsule 1  . GLUCOSAMINE PO Take 1 tablet by mouth 2 (two) times daily.    Marland Kitchen glucose blood (ONETOUCH VERIO) test strip Use as instructed.  Dx:E11.9 100 each 12  . ibuprofen (ADVIL,MOTRIN) 200 MG tablet Take 200 mg by mouth every 6 (six) hours as needed. Reported on 02/13/2016    . loratadine (CLARITIN) 10 MG tablet Take 1 tablet (10 mg total) by mouth daily. 30 tablet 2  . meloxicam (MOBIC) 7.5 MG tablet Take 7.5 mg by mouth every 12 (twelve) hours.  2  . metFORMIN (GLUCOPHAGE) 500 MG tablet Take 1 tablet (500 mg total) by mouth 2 (two) times daily with a meal. 180 tablet 3  . Omega-3 1000 MG CAPS Take 1 g by mouth daily.    [name] DELICA LANCETS FINE MISC Use as directed.  Dx:E11.9 100 each 12  .  oxyCODONE-acetaminophen (PERCOCET) 10-325 MG tablet Take 1 tablet by mouth every 8 (eight) hours as needed for pain. 90 tablet 0  . vitamin B-12 (CYANOCOBALAMIN) 100 MCG tablet Take 100 mcg by mouth daily.    . vitamin C (ASCORBIC ACID) 500 MG tablet Take 1,000 mg by mouth daily.    . vitamin E 400 UNIT capsule Take 400 Units by mouth daily. Reported on 02/13/2016     No current facility-administered medications on file prior to visit.    Allergies  Allergen Reactions  . Penicillins Hives    Hives   . Tramadol Itching    Family History  Problem Relation Age of Onset  . Diabetes Father 62    Deceased  . Diabetes Mother     Living  . Hyperlipidemia Mother   . Tuberculosis Mother   . Asthma Mother   . Diabetes Brother   . Heart disease Maternal Aunt   . Arthritis Other     Maternal Aunts & Uncles  . Heart disease Maternal Uncle   . Colon cancer Maternal Uncle     dx in 77's  . Diabetes Sister   . Kidney failure Brother 30    Diseased  . Healthy Son     x1  . Esophageal cancer Neg Hx   .  Rectal cancer Neg Hx   . Stomach cancer Neg Hx     Social History   Social History  . Marital Status: Single    Spouse Name: N/A  . Number of Children: N/A  . Years of Education: N/A   Social History Main Topics  . Smoking status: Former Smoker -- 0.25 packs/day for 20 years    Types: Cigarettes    Quit date: 12/09/2009  . Smokeless tobacco: Never Used  . Alcohol Use: Yes     Comment: social  . Drug Use: No  . Sexual Activity: Not Currently   Other Topics Concern  . None   Social History Narrative    Review of Systems - See HPI.  All other ROS are negative.  BP 124/70 mmHg  Pulse 92  Temp(Src) 98 F (36.7 C) (Oral)  Ht 5' 5.5" (1.664 m)  Wt 207 lb 6.4 oz (94.076 kg)  BMI 33.98 kg/m2  SpO2 97%  Physical Exam  Constitutional: She is oriented to person, place, and time and well-developed, well-nourished, and in no distress.  HENT:  Head: Normocephalic and  atraumatic.  Eyes: Conjunctivae are normal.  Neck: Neck supple.  Cardiovascular: Normal rate, regular rhythm, normal heart sounds and intact distal pulses.   Pulmonary/Chest: Effort normal and breath sounds normal. No respiratory distress. She has no wheezes. She has no rales. She exhibits no tenderness.  Neurological: She is alert and oriented to person, place, and time.  Skin: Skin is warm and dry. No rash noted.  Psychiatric: Affect normal.  Vitals reviewed.   Recent Results (from the past 2160 hour(s))  Urine cytology ancillary only     Status: Abnormal   Collection Time: 12/20/15 12:00 AM  Result Value Ref Range   Chlamydia Negative     Comment: Normal Reference Range - Negative   Neisseria gonorrhea Negative     Comment: Normal Reference Range - Negative   Trichomonas **POSITIVE** (A)     Comment: Normal Reference Range - Negative  Urine cytology ancillary only     Status: Abnormal   Collection Time: 12/20/15 12:00 AM  Result Value Ref Range   Bacterial vaginitis (A)     **POSITIVE for Gardnerella vaginalis POSITIVE for Atopobium vaginae POSITIVE for Megasphaera 2**    Comment: Normal Reference Range - Negative   Candida vaginitis Negative for Candida Vaginitis Microorganisms     Comment: Normal Reference Range - Negative  CBC     Status: None   Collection Time: 12/20/15  3:32 PM  Result Value Ref Range   WBC 5.7 4.0 - 10.5 K/uL   RBC 4.19 3.87 - 5.11 Mil/uL   Platelets 292.0 150.0 - 400.0 K/uL   Hemoglobin 12.1 12.0 - 15.0 g/dL   HCT 37.4 36.0 - 46.0 %   MCV 89.1 78.0 - 100.0 fl   MCHC 32.5 30.0 - 36.0 g/dL   RDW 13.5 11.5 - 15.5 %  Comp Met (CMET)     Status: Abnormal   Collection Time: 12/20/15  3:32 PM  Result Value Ref Range   Sodium 140 135 - 145 mEq/L   Potassium 3.9 3.5 - 5.1 mEq/L   Chloride 104 96 - 112 mEq/L   CO2 31 19 - 32 mEq/L   Glucose, Bld 103 (H) 70 - 99 mg/dL   BUN 16 6 - 23 mg/dL   Creatinine, Ser 0.76 0.40 - 1.20 mg/dL   Total Bilirubin 0.3  0.2 - 1.2 mg/dL   Alkaline Phosphatase 104 39 - 117  U/L   AST 15 0 - 37 U/L   ALT 16 0 - 35 U/L   Total Protein 7.2 6.0 - 8.3 g/dL   Albumin 4.3 3.5 - 5.2 g/dL   Calcium 9.9 8.4 - 10.5 mg/dL   GFR 101.17 >60.00 mL/min  TSH     Status: None   Collection Time: 12/20/15  3:32 PM  Result Value Ref Range   TSH 0.86 0.35 - 4.50 uIU/mL  Hemoglobin A1c     Status: Abnormal   Collection Time: 12/20/15  3:32 PM  Result Value Ref Range   Hgb A1c MFr Bld 7.1 (H) 4.6 - 6.5 %    Comment: Glycemic Control Guidelines for People with Diabetes:Non Diabetic:  <6%Goal of Therapy: <7%Additional Action Suggested:  >8%   Lipid Profile     Status: Abnormal   Collection Time: 12/20/15  3:32 PM  Result Value Ref Range   Cholesterol 191 0 - 200 mg/dL    Comment: ATP III Classification       Desirable:  < 200 mg/dL               Borderline High:  200 - 239 mg/dL          High:  > = 240 mg/dL   Triglycerides 85.0 0.0 - 149.0 mg/dL    Comment: Normal:  <150 mg/dLBorderline High:  150 - 199 mg/dL   HDL 66.30 >39.00 mg/dL   VLDL 17.0 0.0 - 40.0 mg/dL   LDL Cholesterol 107 (H) 0 - 99 mg/dL   Total CHOL/HDL Ratio 3     Comment:                Men          Women1/2 Average Risk     3.4          3.3Average Risk          5.0          4.42X Average Risk          9.6          7.13X Average Risk          15.0          11.0                       NonHDL 124.44     Comment: NOTE:  Non-HDL goal should be 30 mg/dL higher than patient's LDL goal (i.e. LDL goal of < 70 mg/dL, would have non-HDL goal of < 100 mg/dL)  POCT Urinalysis Dipstick (Automated)     Status: None   Collection Time: 03/02/16  3:24 PM  Result Value Ref Range   Color, UA Light-yellow    Clarity, UA clear    Glucose, UA 3+    Bilirubin, UA neg    Ketones, UA 2+    Spec Grav, UA 1.015    Blood, UA 3+    pH, UA 6.0    Protein, UA neg    Urobilinogen, UA 0.2    Nitrite, UA neg    Leukocytes, UA Negative Negative  POCT CBG (Fasting - Glucose)      Status: Abnormal   Collection Time: 03/02/16  3:26 PM  Result Value Ref Range   Glucose Fasting, POC 456 (A) 70 - 99 mg/dL  CULTURE, URINE COMPREHENSIVE     Status: None   Collection Time: 03/02/16  4:24 PM  Result Value Ref Range   Culture ESCHERICHIA  COLI    Colony Count >=100,000 COLONIES/ML    Organism ID, Bacteria ESCHERICHIA COLI       Susceptibility   Escherichia coli -  (no method available)    AMPICILLIN 4 Sensitive     AMOX/CLAVULANIC <=2 Sensitive     AMPICILLIN/SULBACTAM 4 Sensitive     PIP/TAZO <=4 Sensitive     IMIPENEM <=0.25 Sensitive     CEFAZOLIN <=4 Not Reportable     CEFTRIAXONE <=1 Sensitive     CEFTAZIDIME <=1 Sensitive     CEFEPIME <=1 Sensitive     GENTAMICIN <=1 Sensitive     TOBRAMYCIN <=1 Sensitive     CIPROFLOXACIN <=0.25 Sensitive     LEVOFLOXACIN <=0.12 Sensitive     NITROFURANTOIN <=16 Sensitive     TRIMETH/SULFA* <=20 Sensitive      * NR=NOT REPORTABLE,SEE COMMENTORAL therapy:A cefazolin MIC of <32 predicts susceptibility to the oral agents cefaclor,cefdinir,cefpodoxime,cefprozil,cefuroxime,cephalexin,and loracarbef when used for therapy of uncomplicated UTIs due to E.coli,K.pneumomiae,and P.mirabilis. PARENTERAL therapy: A cefazolinMIC of >8 indicates resistance to parenteralcefazolin. An alternate test method must beperformed to confirm susceptibility to parenteralcefazolin.  CBC     Status: None   Collection Time: 03/02/16  4:39 PM  Result Value Ref Range   WBC 7.0 4.0 - 10.5 K/uL   RBC 4.49 3.87 - 5.11 MIL/uL   Hemoglobin 13.0 12.0 - 15.0 g/dL   HCT 40.1 36.0 - 46.0 %   MCV 89.3 78.0 - 100.0 fL   MCH 29.0 26.0 - 34.0 pg   MCHC 32.4 30.0 - 36.0 g/dL   RDW 13.2 11.5 - 15.5 %   Platelets 277 150 - 400 K/uL   MPV 11.3 8.6 - 12.4 fL  Renal Function Panel     Status: Abnormal   Collection Time: 03/02/16  4:39 PM  Result Value Ref Range   Sodium 134 (L) 135 - 146 mmol/L   Potassium 4.3 3.5 - 5.3 mmol/L   Chloride 93 (L) 98 - 110 mmol/L   CO2  23 20 - 31 mmol/L   Glucose, Bld 440 (H) 65 - 99 mg/dL    Comment: Result repeated and verified.   BUN 14 7 - 25 mg/dL   Creat 0.82 0.50 - 1.05 mg/dL   Albumin 4.3 3.6 - 5.1 g/dL   Calcium 9.7 8.6 - 10.4 mg/dL   Phosphorus 3.4 2.5 - 4.5 mg/dL  Fructosamine     Status: Abnormal   Collection Time: 03/02/16  4:40 PM  Result Value Ref Range   Fructosamine 506 (H) 190 - 270 umol/L  POCT CBG (Fasting - Glucose)     Status: Abnormal   Collection Time: 03/05/16  4:46 PM  Result Value Ref Range   Glucose Fasting, POC 221 (A) 70 - 99 mg/dL    Assessment/Plan: Diabetes mellitus without complication ([facility]) Sugars are improving. Will move to standard titration schedule -- will increase by 2 units every 3-4 days until fasting sugars are averaging 80-130. Discussed decreasing by 4 units if sugars < 80. Continue diet and exercise. Follow-up 2 weeks. Will discuss Ophthalmology referral, foot examination and urine microalbumin at that visit.   Acute cystitis without hematuria Symptoms resolved. Patient is to complete antibiotic course. Will repeat UA at follow-up visit. She is to call if there is any recurrence of symptoms.

## 2016-03-11 NOTE — Assessment & Plan Note (Signed)
Symptoms resolved. Patient is to complete antibiotic course. Will repeat UA at follow-up visit. She is to call if there is any recurrence of symptoms.

## 2016-03-11 NOTE — Assessment & Plan Note (Signed)
Sugars are improving. Will move to standard titration schedule -- will increase by 2 units every 3-4 days until fasting sugars are averaging 80-130. Discussed decreasing by 4 units if sugars < 80. Continue diet and exercise. Follow-up 2 weeks. Will discuss Ophthalmology referral, foot examination and urine microalbumin at that visit.

## 2016-03-15 ENCOUNTER — Telehealth: Payer: Self-pay | Admitting: Physician Assistant

## 2016-03-15 NOTE — Telephone Encounter (Signed)
Pt says that she was in to see PCP the other day. Pt says that she was advised by pharmacy that PCP would need to call [employer] (pt's insurance) to request Rx for insulin and also her test strips.    Pt says that she use [id number] - [place], [place] - [place]: [phone]

## 2016-03-15 NOTE — Telephone Encounter (Signed)
Caller name: Self  Can be reached: [phone] Pharmacy:  [facility] #[id number] - [place], [place] [id number] (Phone) [phone] (Fax)         Reason for call: Request rx for 78ml needles because she got 57ml. Does not like the larger needle

## 2016-03-16 MED ORDER — INSULIN PEN NEEDLE 32G X 4 MM MISC: Status: DC

## 2016-03-16 NOTE — Telephone Encounter (Signed)
Will complete in order as received, no PA received from pharmacy with Insurance information confirmation; pt should have been informed of PA policy of request must come from pharmacy & response time can take up to 30-days at this time/SLS

## 2016-03-16 NOTE — Telephone Encounter (Signed)
Spoke to the patient and the insulin pen needles she previously had were the 13ml which she prefers over the 6 ml. Updated medication list and sent in requested pen needle size.

## 2016-03-19 ENCOUNTER — Telehealth: Payer: Self-pay | Admitting: *Deleted

## 2016-03-19 NOTE — Telephone Encounter (Signed)
PA initiated on [email or web address], awaiting determination. JG//CMA

## 2016-03-20 NOTE — Telephone Encounter (Signed)
PA approved effective 03/19/16 through 12/09/38. Ref #: [id number]. Approval letter sent for scanning. JG//CMA

## 2016-03-21 ENCOUNTER — Ambulatory Visit: Payer: BLUE CROSS/BLUE SHIELD | Admitting: Physician Assistant

## 2016-03-21 NOTE — Telephone Encounter (Signed)
Chaya Jan, CMA at 03/20/2016 8:54 AM     Status: Signed       Expand All Collapse All   PA approved effective 03/19/16 through 12/09/38. Ref #: [id number]. Approval letter sent for scanning. JG//CMA            Chaya Jan, CMA at 03/19/2016 10:40 AM     Status: Signed       Expand All Collapse All   PA initiated on [email or web address], awaiting determination. JG//CMA

## 2016-03-27 ENCOUNTER — Ambulatory Visit: Payer: BLUE CROSS/BLUE SHIELD | Admitting: Physician Assistant

## 2016-03-27 ENCOUNTER — Telehealth: Payer: Self-pay | Admitting: Physician Assistant

## 2016-03-28 ENCOUNTER — Encounter: Payer: Self-pay | Admitting: Physician Assistant

## 2016-03-28 ENCOUNTER — Ambulatory Visit (INDEPENDENT_AMBULATORY_CARE_PROVIDER_SITE_OTHER): Payer: BLUE CROSS/BLUE SHIELD | Admitting: Physician Assistant

## 2016-03-28 VITALS — BP 132/88 | HR 78 | Temp 97.8°F | Resp 16 | Ht 65.5 in | Wt 210.1 lb

## 2016-03-28 DIAGNOSIS — E119 Type 2 diabetes mellitus without complications: Secondary | ICD-10-CM

## 2016-03-28 LAB — URINALYSIS, ROUTINE W REFLEX MICROSCOPIC
Bilirubin Urine: NEGATIVE
Ketones, ur: NEGATIVE
Nitrite: NEGATIVE
SPECIFIC GRAVITY, URINE: 1.01 (ref 1.000–1.030)
TOTAL PROTEIN, URINE-UPE24: NEGATIVE
URINE GLUCOSE: NEGATIVE
Urobilinogen, UA: 0.2 (ref 0.0–1.0)
pH: 6.5 (ref 5.0–8.0)

## 2016-03-28 LAB — MICROALBUMIN / CREATININE URINE RATIO
Creatinine,U: 96.1 mg/dL
MICROALB UR: 0.7 mg/dL (ref 0.0–1.9)
MICROALB/CREAT RATIO: 0.7 mg/g (ref 0.0–30.0)

## 2016-03-28 LAB — HEMOGLOBIN A1C: HEMOGLOBIN A1C: 9.6 % — AB (ref 4.6–6.5)

## 2016-03-28 MED ORDER — BAYER CONTOUR USB W/DEVICE KIT: PACK | Status: DC

## 2016-03-28 MED ORDER — GLUCOSE BLOOD VI STRP: ORAL_STRIP | Status: DC

## 2016-03-28 NOTE — Patient Instructions (Signed)
Please go to the lab for blood work. I will call with results.  Your sugar levels are looking much better. Continue the 20 units of Tresiba daily.  Continue diet and exercise.  I have sent in a prescription for a new meter and strips covered by your insurance.  You will be contacted for an appointment for a diabetic eye exam.  Follow-up will be based on results.

## 2016-03-28 NOTE — Progress Notes (Signed)
History of Present Illness: Patient is a 55 y.o. female who presents to clinic today for follow-up of Diabetes Mellitus II, currently uncontrolled.  Patient currently on medication regimen of Tresiba.  Endorses taking medications as directed. Endorses taking 20 units daily.  Denies numbness of hands/feet. Denies vision changes. Is checking blood glucose as directed. Fasting sugars are now averaging 100-115.    Latest Maintenance: A1C --  Lab Results  Component Value Date   HGBA1C 7.1* 12/20/2015   Diabetic Eye Exam -- Patient to schedule appt. Referral placed. Urine Microalbumin -- Due. Will order today. Foot Exam -- Due. Will perform today.   Past Medical History  Diagnosis Date  . Gallstones   . Bronchitis     hx of  . Migraine     hx of  . Arthritis     bilateral knees  . Anemia   . Fibroid tumor     Had partial hysterectomy    Current Outpatient Prescriptions on File Prior to Visit  Medication Sig Dispense Refill  . cyclobenzaprine (FLEXERIL) 10 MG tablet TAKE 1 TABLET BY MOUTH AT BEDTIME 30 tablet 0  . Echinacea-Goldenseal (ECHINACEA COMB/GOLDEN SEAL PO) Take by mouth.    . fluticasone (FLONASE) 50 MCG/ACT nasal spray Place 2 sprays into both nostrils daily. 16 g 2  . gabapentin (NEURONTIN) 100 MG capsule Take 1 capsule (100 mg total) by mouth 2 (two) times daily. 180 capsule 1  . GLUCOSAMINE PO Take 1 tablet by mouth 2 (two) times daily.    Marland Kitchen ibuprofen (ADVIL,MOTRIN) 200 MG tablet Take 200 mg by mouth every 6 (six) hours as needed. Reported on 02/13/2016    . Insulin Degludec (TRESIBA FLEXTOUCH) 200 UNIT/ML SOPN Inject 20 Units into the skin daily. 3 mL 3  . Insulin Pen Needle (CAREFINE PEN NEEDLES) 32G X 4 MM MISC Use daily to administer insulin. DX  E11.9 100 each 3  . loratadine (CLARITIN) 10 MG tablet Take 1 tablet (10 mg total) by mouth daily. 30 tablet 2  . meloxicam (MOBIC) 7.5 MG tablet Take 7.5 mg by mouth every 12 (twelve) hours.  2  . metFORMIN (GLUCOPHAGE)  500 MG tablet Take 1 tablet (500 mg total) by mouth 2 (two) times daily with a meal. 180 tablet 3  . Omega-3 1000 MG CAPS Take 1 g by mouth daily.    [name] DELICA LANCETS FINE MISC Use as directed.  Dx:E11.9 100 each 12  . oxyCODONE-acetaminophen (PERCOCET) 10-325 MG tablet Take 1 tablet by mouth every 8 (eight) hours as needed for pain. 90 tablet 0  . vitamin B-12 (CYANOCOBALAMIN) 100 MCG tablet Take 100 mcg by mouth daily.    . vitamin C (ASCORBIC ACID) 500 MG tablet Take 1,000 mg by mouth daily.    . vitamin E 400 UNIT capsule Take 400 Units by mouth daily. Reported on 02/13/2016     No current facility-administered medications on file prior to visit.    Allergies  Allergen Reactions  . Penicillins Hives    Hives   . Tramadol Itching    Family History  Problem Relation Age of Onset  . Diabetes Father 36    Deceased  . Diabetes Mother     Living  . Hyperlipidemia Mother   . Tuberculosis Mother   . Asthma Mother   . Diabetes Brother   . Heart disease Maternal Aunt   . Arthritis Other     Maternal Aunts & Uncles  . Heart disease Maternal Uncle   .  Colon cancer Maternal Uncle     dx in 36's  . Diabetes Sister   . Kidney failure Brother 30    Diseased  . Healthy Son     x1  . Esophageal cancer Neg Hx   . Rectal cancer Neg Hx   . Stomach cancer Neg Hx     Social History   Social History  . Marital Status: Single    Spouse Name: N/A  . Number of Children: N/A  . Years of Education: N/A   Social History Main Topics  . Smoking status: Former Smoker -- 0.25 packs/day for 20 years    Types: Cigarettes    Quit date: 12/09/2009  . Smokeless tobacco: Never Used  . Alcohol Use: Yes     Comment: social  . Drug Use: No  . Sexual Activity: Not Currently   Other Topics Concern  . Not on file   Social History Narrative   Review of Systems: Pertinent ROS are listed in HPI  Physical Examination: BP 132/88 mmHg  Pulse 78  Temp(Src) 97.8 F (36.6 C) (Oral)   Resp 16  Ht 5' 5.5" (1.664 m)  Wt 210 lb 2 oz (95.312 kg)  BMI 34.42 kg/m2  SpO2 98% Eyes: conjunctivae/corneas clear. PERRL, EOM's intact. Fundi benign. Lungs: clear to auscultation bilaterally Heart: regular rate and rhythm, S1, S2 normal, no murmur, click, rub or gallop Extremities: extremities normal, atraumatic, no cyanosis or edema Pulses: 2+ and symmetric Skin: Skin color, texture, turgor normal. No rashes or lesions  Diabetic Foot Exam - Simple   Simple Foot Form  Diabetic Foot exam was performed with the following findings:  Yes 03/28/2016  7:34 AM  Visual Inspection  No deformities, no ulcerations, no other skin breakdown bilaterally:  Yes  Sensation Testing  Intact to touch and monofilament testing bilaterally:  Yes  Pulse Check  Posterior Tibialis and Dorsalis pulse intact bilaterally:  Yes  Comments     Assessment/Plan: Diabetes mellitus without complication (HCC) Glucose log reviewed. Fasting sugars now averaging 100-115 at 20 units of [place] daily. Will continue this regimen. Foot exam updated -- within normal limits. Referral to Ophthalmology placed for diabetic eye examination. Will repeat A1C and microalbumin today.

## 2016-03-28 NOTE — Telephone Encounter (Signed)
Pt came in late 03/27/16, appt time 3:00, pt came in 3:21pm, pt came in this morning for appt, charge or no charge?

## 2016-03-28 NOTE — Telephone Encounter (Signed)
No charge. 

## 2016-03-28 NOTE — Progress Notes (Signed)
Pre visit review using our clinic review tool, if applicable. No additional management support is needed unless otherwise documented below in the visit note/SLS  

## 2016-03-28 NOTE — Assessment & Plan Note (Signed)
Glucose log reviewed. Fasting sugars now averaging 100-115 at 20 units of [place] daily. Will continue this regimen. Foot exam updated -- within normal limits. Referral to Ophthalmology placed for diabetic eye examination. Will repeat A1C and microalbumin today.

## 2016-04-03 ENCOUNTER — Ambulatory Visit (INDEPENDENT_AMBULATORY_CARE_PROVIDER_SITE_OTHER): Payer: BLUE CROSS/BLUE SHIELD | Admitting: Endocrinology

## 2016-04-03 ENCOUNTER — Encounter: Payer: Self-pay | Admitting: Endocrinology

## 2016-04-03 VITALS — BP 134/95 | HR 87 | Temp 98.0°F | Ht 65.5 in | Wt 213.0 lb

## 2016-04-03 DIAGNOSIS — E119 Type 2 diabetes mellitus without complications: Secondary | ICD-10-CM

## 2016-04-03 MED ORDER — LINAGLIPTIN 5 MG PO TABS: 5.0000 mg | ORAL_TABLET | Freq: Every day | ORAL | Status: DC

## 2016-04-03 NOTE — Patient Instructions (Addendum)
good diet and exercise significantly improve the control of your diabetes.  please let me know if you wish to be referred to a dietician.  high blood sugar is very risky to your health.  you should see an eye doctor and dentist every year.  It is very important to get all recommended vaccinations.  controlling your blood pressure and cholesterol drastically reduces the damage diabetes does to your body.  Those who smoke should quit.  please discuss these with your doctor.  check your blood sugar once a day.  vary the time of day when you check, between before the 3 meals, and at bedtime.  also check if you have symptoms of your blood sugar being too high or too low.  please keep a record of the readings and bring it to your next appointment here (or you can bring the meter itself).  You can write it on any piece of paper.  please call us sooner if your blood sugar goes below 70, or if you have a lot of readings over 200.  For now: Please continue the same metformin, and: Change the tresiba to "tradjenta."  i have sent a prescription to your pharmacy.   If necessary, we can add another pill.   Please come back for a follow-up appointment in 1 month.

## 2016-04-03 NOTE — Progress Notes (Signed)
Subjective:    Patient ID: [name], female    DOB: Mar 26, 1961, 55 y.o.   MRN: [id number]  HPI pt states DM was dx'ed in Jan of 2017, when she had a cbg of 400 on steroids; she is off steroids x approx 1 week now; she has mild if any neuropathy of the lower extremities; she is unaware of any associated chronic complications; she has been on insulin since dx; pt says her diet and exercise are; she has never had GDM, pancreatitis, severe hypoglycemia or DKA.  She takes tresiba 20/d, and metformin.  She says cbg's vary from 80-175.   Past Medical History  Diagnosis Date  . Gallstones   . Bronchitis     hx of  . Migraine     hx of  . Arthritis     bilateral knees  . Anemia   . Fibroid tumor     Had partial hysterectomy    Past Surgical History  Procedure Laterality Date  . Abdominal hysterectomy      partial   . Tubal ligation    . Boil removed     . Cholecystectomy  Oct 2012  . Total knee arthroplasty  08/01/2012    Procedure: TOTAL KNEE ARTHROPLASTY;  Surgeon: Augustin Schooling, MD;  Location: [facility];  Service: Orthopedics;  Laterality: Right;  RIGHT TOTAL KNEE ARTHROPLASTY  . Wisdom tooth extraction    . Hand surgery  04-15-15  . Elbow surgery  04-15-15  . Carpal tunnel release  04-15-15  . Trigger finger release  04-15-15  . Nerve damage  04-15-15    Social History   Social History  . Marital Status: Single    Spouse Name: N/A  . Number of Children: N/A  . Years of Education: N/A   Occupational History  . Not on file.   Social History Main Topics  . Smoking status: Former Smoker -- 0.25 packs/day for 20 years    Types: Cigarettes    Quit date: 12/09/2009  . Smokeless tobacco: Never Used  . Alcohol Use: 0.0 oz/week    0 Standard drinks or equivalent per week     Comment: social  . Drug Use: No  . Sexual Activity: Not Currently   Other Topics Concern  . Not on file   Social History Narrative    Current Outpatient Prescriptions on File Prior to Visit    Medication Sig Dispense Refill  . Blood Glucose Monitoring Suppl (CONTOUR USB BLOOD GLUCOSE SYS) w/Device KIT Use daily as directed. 1 kit 0  . cyclobenzaprine (FLEXERIL) 10 MG tablet TAKE 1 TABLET BY MOUTH AT BEDTIME 30 tablet 0  . Echinacea-Goldenseal (ECHINACEA COMB/GOLDEN SEAL PO) Take by mouth.    . fluticasone (FLONASE) 50 MCG/ACT nasal spray Place 2 sprays into both nostrils daily. 16 g 2  . gabapentin (NEURONTIN) 100 MG capsule Take 1 capsule (100 mg total) by mouth 2 (two) times daily. 180 capsule 1  . GLUCOSAMINE PO Take 1 tablet by mouth 2 (two) times daily.    Marland Kitchen glucose blood (BAYER CONTOUR TEST) test strip Use twice daily as instructed to check sugars. Dx E. 11.9 100 each 12  . ibuprofen (ADVIL,MOTRIN) 200 MG tablet Take 200 mg by mouth every 6 (six) hours as needed. Reported on 02/13/2016    . Insulin Pen Needle (CAREFINE PEN NEEDLES) 32G X 4 MM MISC Use daily to administer insulin. DX  E11.9 100 each 3  . loratadine (CLARITIN) 10 MG tablet Take 1  tablet (10 mg total) by mouth daily. 30 tablet 2  . meloxicam (MOBIC) 7.5 MG tablet Take 7.5 mg by mouth every 12 (twelve) hours.  2  . metFORMIN (GLUCOPHAGE) 500 MG tablet Take 1 tablet (500 mg total) by mouth 2 (two) times daily with a meal. 180 tablet 3  . Omega-3 1000 MG CAPS Take 1 g by mouth daily.    [name] DELICA LANCETS FINE MISC Use as directed.  Dx:E11.9 100 each 12  . oxyCODONE-acetaminophen (PERCOCET) 10-325 MG tablet Take 1 tablet by mouth every 8 (eight) hours as needed for pain. 90 tablet 0  . vitamin B-12 (CYANOCOBALAMIN) 100 MCG tablet Take 100 mcg by mouth daily.    . vitamin C (ASCORBIC ACID) 500 MG tablet Take 1,000 mg by mouth daily.    . vitamin E 400 UNIT capsule Take 400 Units by mouth daily. Reported on 02/13/2016     No current facility-administered medications on file prior to visit.    Allergies  Allergen Reactions  . Penicillins Hives    Hives   . Tramadol Itching    Family History  Problem  Relation Age of Onset  . Diabetes Father 33    Deceased  . Diabetes Mother     Living  . Hyperlipidemia Mother   . Tuberculosis Mother   . Asthma Mother   . Diabetes Brother   . Heart disease Maternal Aunt   . Arthritis Other     Maternal Aunts & Uncles  . Heart disease Maternal Uncle   . Colon cancer Maternal Uncle     dx in 59's  . Diabetes Sister   . Kidney failure Brother 30    Diseased  . Healthy Son     x1  . Esophageal cancer Neg Hx   . Rectal cancer Neg Hx   . Stomach cancer Neg Hx     BP 134/95 mmHg  Pulse 87  Temp(Src) 98 F (36.7 C) (Oral)  Ht 5' 5.5" (1.664 m)  Wt 213 lb (96.616 kg)  BMI 34.89 kg/m2  SpO2 96%  Review of Systems denies weight loss, headache, chest pain, sob, n/v, urinary frequency, muscle cramps, excessive diaphoresis, depression, cold intolerance, rhinorrhea, and easy bruising.   She has slight blurry vision.       Objective:   Physical Exam VS: see vs page GEN: no distress HEAD: head: no deformity eyes: no periorbital swelling, no proptosis external nose and ears are normal mouth: no lesion seen NECK: supple, thyroid is not enlarged CHEST WALL: no deformity LUNGS: clear to auscultation CV: reg rate and rhythm, no murmur ABD: abdomen is soft, nontender.  no hepatosplenomegaly.  not distended.  no hernia MUSCULOSKELETAL: muscle bulk and strength are grossly normal.  no obvious joint swelling.  gait is normal and steady EXTEMITIES: no deformity.  no ulcer on the feet.  feet are of normal color and temp.  no edema. There is bilateral onychomycosis of the toenails.   PULSES: dorsalis pedis intact bilat.  no carotid bruit NEURO:  cn 2-12 grossly intact.   readily moves all 4's.  sensation is intact to touch on the feet SKIN:  Normal texture and temperature.  No rash or suspicious lesion is visible.   NODES:  None palpable at the neck PSYCH: alert, well-oriented.  Does not appear anxious nor depressed.   Lab Results  Component Value  Date   HGBA1C 9.6* 03/28/2016   I personally reviewed electrocardiogram tracing (09/06/11): Indication: abd pain. Impression: NS-T abnormalities  I have reviewed outside records, and summarized:  Pt was noted to have elevated a1c, and referred here.      Assessment & Plan:  Type 1 DM, new to me: she needs increased rx.     Patient is advised the following: Patient Instructions  good diet and exercise significantly improve the control of your diabetes.  please let me know if you wish to be referred to a dietician.  high blood sugar is very risky to your health.  you should see an eye doctor and dentist every year.  It is very important to get all recommended vaccinations.  controlling your blood pressure and cholesterol drastically reduces the damage diabetes does to your body.  Those who smoke should quit.  please discuss these with your doctor.  check your blood sugar once a day.  vary the time of day when you check, between before the 3 meals, and at bedtime.  also check if you have symptoms of your blood sugar being too high or too low.  please keep a record of the readings and bring it to your next appointment here (or you can bring the meter itself).  You can write it on any piece of paper.  please call us sooner if your blood sugar goes below 70, or if you have a lot of readings over 200.  For now: Please continue the same metformin, and: Change the tresiba to "tradjenta."  i have sent a prescription to your pharmacy.   If necessary, we can add another pill.   Please come back for a follow-up appointment in 1 month.

## 2016-04-04 ENCOUNTER — Telehealth: Payer: Self-pay | Admitting: Endocrinology

## 2016-04-04 MED ORDER — SAXAGLIPTIN HCL 5 MG PO TABS: 5.0000 mg | ORAL_TABLET | Freq: Every day | ORAL | Status: DC

## 2016-04-04 NOTE — Telephone Encounter (Signed)
please call patient: Ins prefers onglyza over tradjenta.  i have sent a prescription to your pharmacy

## 2016-04-04 NOTE — Telephone Encounter (Signed)
Attempted to reach the pt. Will try again at a later time.  

## 2016-04-05 NOTE — Telephone Encounter (Signed)
Attempted to reach the pt. Pt was unavailable. Will try again at a later time.  

## 2016-04-10 NOTE — Telephone Encounter (Signed)
Left a vm advising on note below. Requested a call back if the pt would like to discuss.

## 2016-04-27 LAB — HM DIABETES EYE EXAM

## 2016-04-28 ENCOUNTER — Other Ambulatory Visit: Payer: Self-pay | Admitting: Physician Assistant

## 2016-05-03 ENCOUNTER — Ambulatory Visit: Payer: BLUE CROSS/BLUE SHIELD | Admitting: Endocrinology

## 2016-05-23 ENCOUNTER — Encounter: Payer: Self-pay | Admitting: Endocrinology

## 2016-05-23 ENCOUNTER — Ambulatory Visit (INDEPENDENT_AMBULATORY_CARE_PROVIDER_SITE_OTHER): Payer: BLUE CROSS/BLUE SHIELD | Admitting: Endocrinology

## 2016-05-23 VITALS — BP 178/108 | HR 88 | Temp 98.5°F | Ht 66.0 in | Wt 216.2 lb

## 2016-05-23 DIAGNOSIS — E119 Type 2 diabetes mellitus without complications: Secondary | ICD-10-CM | POA: Diagnosis not present

## 2016-05-23 MED ORDER — LOSARTAN POTASSIUM-HCTZ 50-12.5 MG PO TABS: 1.0000 | ORAL_TABLET | Freq: Every day | ORAL | Status: DC

## 2016-05-23 NOTE — Progress Notes (Signed)
Subjective:    Patient ID: [name], female    DOB: 1961-10-13, 55 y.o.   MRN: [id number]  HPI Pt returns for f/u of diabetes mellitus: DM type: 2 Dx'ed: 2017, when she had a cbg of 400 on steroids Complications: none Therapy: insulin since dx GDM: never DKA: never Severe hypoglycemia: never Pancreatitis: never Other: he took insulin for a brief time in early 2017 Interval history: no cbg record, but states cbg's vary from 73-155.  pt states she feels well in general.   Past Medical History  Diagnosis Date  . Gallstones   . Bronchitis     hx of  . Migraine     hx of  . Arthritis     bilateral knees  . Anemia   . Fibroid tumor     Had partial hysterectomy    Past Surgical History  Procedure Laterality Date  . Abdominal hysterectomy      partial   . Tubal ligation    . Boil removed     . Cholecystectomy  Oct 2012  . Total knee arthroplasty  08/01/2012    Procedure: TOTAL KNEE ARTHROPLASTY;  Surgeon: Augustin Schooling, MD;  Location: [facility];  Service: Orthopedics;  Laterality: Right;  RIGHT TOTAL KNEE ARTHROPLASTY  . Wisdom tooth extraction    . Hand surgery  04-15-15  . Elbow surgery  04-15-15  . Carpal tunnel release  04-15-15  . Trigger finger release  04-15-15  . Nerve damage  04-15-15    Social History   Social History  . Marital Status: Single    Spouse Name: N/A  . Number of Children: N/A  . Years of Education: N/A   Occupational History  . Not on file.   Social History Main Topics  . Smoking status: Former Smoker -- 0.25 packs/day for 20 years    Types: Cigarettes    Quit date: 12/09/2009  . Smokeless tobacco: Never Used  . Alcohol Use: 0.0 oz/week    0 Standard drinks or equivalent per week     Comment: social  . Drug Use: No  . Sexual Activity: Not Currently   Other Topics Concern  . Not on file   Social History Narrative    Current Outpatient Prescriptions on File Prior to Visit  Medication Sig Dispense Refill  . Blood Glucose Monitoring  Suppl (CONTOUR USB BLOOD GLUCOSE SYS) w/Device KIT Use daily as directed. 1 kit 0  . cyclobenzaprine (FLEXERIL) 10 MG tablet TAKE 1 TABLET BY MOUTH AT BEDTIME 30 tablet 0  . Echinacea-Goldenseal (ECHINACEA COMB/GOLDEN SEAL PO) Take by mouth.    . fluticasone (FLONASE) 50 MCG/ACT nasal spray Place 2 sprays into both nostrils daily. 16 g 2  . gabapentin (NEURONTIN) 100 MG capsule Take 1 capsule (100 mg total) by mouth 2 (two) times daily. 180 capsule 1  . GLUCOSAMINE PO Take 1 tablet by mouth 2 (two) times daily.    Marland Kitchen glucose blood (BAYER CONTOUR TEST) test strip Use twice daily as instructed to check sugars. Dx E. 11.9 100 each 12  . ibuprofen (ADVIL,MOTRIN) 200 MG tablet Take 200 mg by mouth every 6 (six) hours as needed. Reported on 02/13/2016    . Insulin Pen Needle (CAREFINE PEN NEEDLES) 32G X 4 MM MISC Use daily to administer insulin. DX  E11.9 100 each 3  . loratadine (CLARITIN) 10 MG tablet Take 1 tablet (10 mg total) by mouth daily. 30 tablet 2  . meloxicam (MOBIC) 7.5 MG tablet Take  7.5 mg by mouth every 12 (twelve) hours.  2  . metFORMIN (GLUCOPHAGE) 500 MG tablet Take 1 tablet (500 mg total) by mouth 2 (two) times daily with a meal. 180 tablet 3  . Omega-3 1000 MG CAPS Take 1 g by mouth daily.    [name] DELICA LANCETS FINE MISC Use as directed.  Dx:E11.9 100 each 12  . oxyCODONE-acetaminophen (PERCOCET) 10-325 MG tablet Take 1 tablet by mouth every 8 (eight) hours as needed for pain. 90 tablet 0  . saxagliptin HCl (ONGLYZA) 5 MG TABS tablet Take 1 tablet (5 mg total) by mouth daily. 30 tablet 11  . vitamin B-12 (CYANOCOBALAMIN) 100 MCG tablet Take 100 mcg by mouth daily.    . vitamin C (ASCORBIC ACID) 500 MG tablet Take 1,000 mg by mouth daily.    . vitamin E 400 UNIT capsule Take 400 Units by mouth daily. Reported on 02/13/2016     No current facility-administered medications on file prior to visit.    Allergies  Allergen Reactions  . Penicillins Hives    Hives   . Tramadol  Itching    Family History  Problem Relation Age of Onset  . Diabetes Father 3    Deceased  . Diabetes Mother     Living  . Hyperlipidemia Mother   . Tuberculosis Mother   . Asthma Mother   . Diabetes Brother   . Heart disease Maternal Aunt   . Arthritis Other     Maternal Aunts & Uncles  . Heart disease Maternal Uncle   . Colon cancer Maternal Uncle     dx in 33's  . Diabetes Sister   . Kidney failure Brother 30    Diseased  . Healthy Son     x1  . Esophageal cancer Neg Hx   . Rectal cancer Neg Hx   . Stomach cancer Neg Hx     BP 178/108 mmHg  Pulse 88  Temp(Src) 98.5 F (36.9 C)  Ht '5\' 6"'  (1.676 m)  Wt 216 lb 3.2 oz (98.068 kg)  BMI 34.91 kg/m2  SpO2 98%  Review of Systems She denies hypoglycemia.      Objective:   Physical Exam VITAL SIGNS:  See vs page GENERAL: no distress Pulses: dorsalis pedis intact bilat.   MSK: no deformity of the feet CV: no leg edema.  Skin:  no ulcer on the feet.  normal color and temp on the feet. Neuro: sensation is intact to touch on the feet   Fructosamine=256    Assessment & Plan:  HTN: worse.  Type 2 DM: well-controlled off insulin.  continue the same medications  Patient is advised the following: Patient Instructions  blood tests are requested for you today.  We'll let you know about the results. i have sent a prescription to your pharmacy, for the blood pressure. Please have a blood pressure recheck at [place] in approx 1-2 weeks.  Please call to schedule this.   Please come back for a follow-up appointment in 4 months.   Renato Shin, MD

## 2016-05-23 NOTE — Patient Instructions (Addendum)
blood tests are requested for you today.  We'll let you know about the results. i have sent a prescription to your pharmacy, for the blood pressure. Please have a blood pressure recheck at [place] in approx 1-2 weeks.  Please call to schedule this.   Please come back for a follow-up appointment in 4 months.

## 2016-05-24 LAB — FRUCTOSAMINE: FRUCTOSAMINE: 256 umol/L (ref 0–285)

## 2016-05-29 ENCOUNTER — Other Ambulatory Visit: Payer: Self-pay | Admitting: Medical

## 2016-06-13 ENCOUNTER — Encounter: Payer: Self-pay | Admitting: Physician Assistant

## 2016-06-13 ENCOUNTER — Ambulatory Visit (INDEPENDENT_AMBULATORY_CARE_PROVIDER_SITE_OTHER): Payer: BLUE CROSS/BLUE SHIELD | Admitting: Physician Assistant

## 2016-06-13 VITALS — BP 132/88 | HR 96 | Temp 97.8°F | Resp 19 | Ht 66.0 in | Wt 215.0 lb

## 2016-06-13 DIAGNOSIS — I10 Essential (primary) hypertension: Secondary | ICD-10-CM | POA: Diagnosis not present

## 2016-06-13 DIAGNOSIS — N951 Menopausal and female climacteric states: Secondary | ICD-10-CM

## 2016-06-13 HISTORY — DX: Essential (primary) hypertension: I10

## 2016-06-13 NOTE — Patient Instructions (Signed)
Please go to the lab for blood work. I will cal with results.  Please continue chronic medications as directed. Follow-up with Dr. Loanne Drilling as scheduled.

## 2016-06-13 NOTE — Assessment & Plan Note (Addendum)
Doing very well. Will check BMP today. FU 6 months.

## 2016-06-13 NOTE — Progress Notes (Signed)
Pre visit review using our clinic review tool, if applicable. No additional management support is needed unless otherwise documented below in the visit note/SLS  

## 2016-06-13 NOTE — Progress Notes (Signed)
Patient presents to clinic today for follow-up of hypertension.  Is currently on losartan-HCTZ 50-12.5 mg daily. Is taking medication as directed. Patient denies chest pain, palpitations, lightheadedness, dizziness, vision changes or frequent headaches.  BP Readings from Last 3 Encounters:  06/13/16 132/88  05/23/16 178/108  04/03/16 134/95   Past Medical History  Diagnosis Date  . Gallstones   . Bronchitis     hx of  . Migraine     hx of  . Arthritis     bilateral knees  . Anemia   . Fibroid tumor     Had partial hysterectomy    Current Outpatient Prescriptions on File Prior to Visit  Medication Sig Dispense Refill  . Blood Glucose Monitoring Suppl (CONTOUR USB BLOOD GLUCOSE SYS) w/Device KIT Use daily as directed. 1 kit 0  . Cinnamon 500 MG capsule Take 1,000 mg by mouth 2 (two) times daily.    . cyclobenzaprine (FLEXERIL) 10 MG tablet TAKE 1 TABLET BY MOUTH AT BEDTIME 30 tablet 0  . Echinacea-Goldenseal (ECHINACEA COMB/GOLDEN SEAL PO) Take by mouth.    . fluticasone (FLONASE) 50 MCG/ACT nasal spray Place 2 sprays into both nostrils daily. 16 g 2  . gabapentin (NEURONTIN) 100 MG capsule Take 1 capsule (100 mg total) by mouth 2 (two) times daily. 180 capsule 1  . GLUCOSAMINE PO Take 1 tablet by mouth 2 (two) times daily.    Marland Kitchen glucose blood (BAYER CONTOUR TEST) test strip Use twice daily as instructed to check sugars. Dx E. 11.9 100 each 12  . ibuprofen (ADVIL,MOTRIN) 200 MG tablet Take 200 mg by mouth every 6 (six) hours as needed. Reported on 02/13/2016    . loratadine (CLARITIN) 10 MG tablet TAKE 1 TABLET BY MOUTH EVERY DAY 30 tablet 2  . losartan-hydrochlorothiazide (HYZAAR) 50-12.5 MG tablet Take 1 tablet by mouth daily. 30 tablet 3  . metFORMIN (GLUCOPHAGE) 500 MG tablet Take 1 tablet (500 mg total) by mouth 2 (two) times daily with a meal. 180 tablet 3  . Misc Natural Products (HERBAL ENERGY COMPLEX PO) Take by mouth 2 (two) times daily with a meal.    . Multiple  Vitamins-Minerals (WOMENS MULTIVITAMIN PO) Take by mouth daily.    . Omega-3 1000 MG CAPS Take 1 g by mouth daily.    [name] DELICA LANCETS FINE MISC Use as directed.  Dx:E11.9 100 each 12  . oxyCODONE-acetaminophen (PERCOCET) 10-325 MG tablet Take 1 tablet by mouth every 8 (eight) hours as needed for pain. 90 tablet 0  . saxagliptin HCl (ONGLYZA) 5 MG TABS tablet Take 1 tablet (5 mg total) by mouth daily. 30 tablet 11  . vitamin B-12 (CYANOCOBALAMIN) 100 MCG tablet Take 100 mcg by mouth daily.    . vitamin C (ASCORBIC ACID) 500 MG tablet Take 1,000 mg by mouth daily.    . vitamin E 400 UNIT capsule Take 400 Units by mouth daily. Reported on 02/13/2016     No current facility-administered medications on file prior to visit.    Allergies  Allergen Reactions  . Penicillins Hives    Hives   . Tramadol Itching    Family History  Problem Relation Age of Onset  . Diabetes Father 34    Deceased  . Diabetes Mother     Living  . Hyperlipidemia Mother   . Tuberculosis Mother   . Asthma Mother   . Diabetes Brother   . Heart disease Maternal Aunt   . Arthritis Other  Maternal Aunts & Uncles  . Heart disease Maternal Uncle   . Colon cancer Maternal Uncle     dx in 55's  . Diabetes Sister   . Kidney failure Brother 30    Diseased  . Healthy Son     x1  . Esophageal cancer Neg Hx   . Rectal cancer Neg Hx   . Stomach cancer Neg Hx     Social History   Social History  . Marital Status: Single    Spouse Name: N/A  . Number of Children: N/A  . Years of Education: N/A   Social History Main Topics  . Smoking status: Former Smoker -- 0.25 packs/day for 20 years    Types: Cigarettes    Quit date: 12/09/2009  . Smokeless tobacco: Never Used  . Alcohol Use: 0.0 oz/week    0 Standard drinks or equivalent per week     Comment: social  . Drug Use: No  . Sexual Activity: Not Currently   Other Topics Concern  . None   Social History Narrative   Review of Systems - See HPI.   All other ROS are negative.  BP 132/88 mmHg  Pulse 96  Temp(Src) 97.8 F (36.6 C) (Oral)  Resp 19  Ht 5' 6" (1.676 m)  Wt 215 lb (97.523 kg)  BMI 34.72 kg/m2  SpO2 100%  Physical Exam  Constitutional: She is oriented to person, place, and time and well-developed, well-nourished, and in no distress.  HENT:  Head: Normocephalic and atraumatic.  Eyes: Conjunctivae are normal.  Neck: Neck supple.  Cardiovascular: Normal rate, regular rhythm, normal heart sounds and intact distal pulses.   Pulmonary/Chest: Effort normal and breath sounds normal. No respiratory distress. She has no wheezes. She has no rales. She exhibits no tenderness.  Neurological: She is alert and oriented to person, place, and time.  Skin: Skin is warm and dry. No rash noted.  Psychiatric: Affect normal.  Vitals reviewed.   Recent Results (from the past 2160 hour(s))  Hemoglobin A1c     Status: Abnormal   Collection Time: 03/28/16  7:45 AM  Result Value Ref Range   Hgb A1c MFr Bld 9.6 (H) 4.6 - 6.5 %    Comment: Glycemic Control Guidelines for People with Diabetes:Non Diabetic:  <6%Goal of Therapy: <7%Additional Action Suggested:  >8%   Urine Microalbumin w/creat. ratio     Status: None   Collection Time: 03/28/16  7:45 AM  Result Value Ref Range   Microalb, Ur 0.7 0.0 - 1.9 mg/dL   Creatinine,U 96.1 mg/dL   Microalb Creat Ratio 0.7 0.0 - 30.0 mg/g  Urinalysis, Routine w reflex microscopic     Status: Abnormal   Collection Time: 03/28/16  7:45 AM  Result Value Ref Range   Color, Urine YELLOW Yellow;Lt. Yellow   APPearance CLEAR Clear   Specific Gravity, Urine 1.010 1.000-1.030   pH 6.5 5.0 - 8.0   Total Protein, Urine NEGATIVE Negative   Urine Glucose NEGATIVE Negative   Ketones, ur NEGATIVE Negative   Bilirubin Urine NEGATIVE Negative   Hgb urine dipstick SMALL (A) Negative   Urobilinogen, UA 0.2 0.0 - 1.0   Leukocytes, UA TRACE (A) Negative   Nitrite NEGATIVE Negative   WBC, UA 0-2/hpf 0-2/hpf    RBC / HPF 0-2/hpf 0-2/hpf   Squamous Epithelial / LPF Rare(0-4/hpf) Rare(0-4/hpf)  HM DIABETES EYE EXAM     Status: None   Collection Time: 04/27/16 12:00 AM  Result Value Ref Range   HM  Diabetic Eye Exam No Retinopathy No Retinopathy  Fructosamine     Status: None   Collection Time: 05/23/16  1:52 PM  Result Value Ref Range   Fructosamine 256 0 - 285 umol/L    Comment: Published reference interval for apparently healthy subjects between age 20 and 60 is 205 - 285 umol/L and in a poorly controlled diabetic population is 228 - 563 umol/L with a mean of 396 umol/L.     Assessment/Plan: Essential hypertension Doing very well. Will check BMP today. FU 6 months.     Martin, William Cody, PA-C  

## 2016-06-14 LAB — BASIC METABOLIC PANEL
BUN: 18 mg/dL (ref 6–23)
CALCIUM: 10.3 mg/dL (ref 8.4–10.5)
CO2: 29 meq/L (ref 19–32)
CREATININE: 0.85 mg/dL (ref 0.40–1.20)
Chloride: 104 mEq/L (ref 96–112)
GFR: 88.75 mL/min (ref >60.00)
Glucose, Bld: 92 mg/dL (ref 70–99)
Potassium: 3.5 mEq/L (ref 3.5–5.1)
Sodium: 141 mEq/L (ref 135–145)

## 2016-06-16 ENCOUNTER — Other Ambulatory Visit: Payer: Self-pay | Admitting: Physician Assistant

## 2016-06-20 LAB — ESTROGENS, TOTAL: Estrogen: 71 pg/mL

## 2016-08-27 ENCOUNTER — Other Ambulatory Visit: Payer: Self-pay

## 2016-08-27 MED ORDER — LORATADINE 10 MG PO TABS: 10.0000 mg | ORAL_TABLET | Freq: Every day | ORAL | 2 refills | Status: DC

## 2016-08-29 ENCOUNTER — Other Ambulatory Visit: Payer: Self-pay

## 2016-08-29 MED ORDER — LORATADINE 10 MG PO TABS: 10.0000 mg | ORAL_TABLET | Freq: Every day | ORAL | 2 refills | Status: DC

## 2016-09-13 ENCOUNTER — Other Ambulatory Visit: Payer: Self-pay | Admitting: Physician Assistant

## 2016-09-13 NOTE — Telephone Encounter (Signed)
Last OV: 06/13/16 Last filled: 06/17/16, #30, 1 RF Sig: TAKE 1 TABLET BY MOUTH AT BEDTIME

## 2016-09-14 ENCOUNTER — Ambulatory Visit (INDEPENDENT_AMBULATORY_CARE_PROVIDER_SITE_OTHER): Payer: BLUE CROSS/BLUE SHIELD | Admitting: Physician Assistant

## 2016-09-14 ENCOUNTER — Encounter: Payer: Self-pay | Admitting: Physician Assistant

## 2016-09-14 VITALS — BP 128/88 | HR 95 | Temp 97.9°F | Resp 16 | Ht 66.0 in | Wt 221.4 lb

## 2016-09-14 DIAGNOSIS — R600 Localized edema: Secondary | ICD-10-CM

## 2016-09-14 LAB — BASIC METABOLIC PANEL
BUN: 14 mg/dL (ref 7–25)
CHLORIDE: 101 mmol/L (ref 98–110)
CO2: 26 mmol/L (ref 20–31)
Calcium: 10 mg/dL (ref 8.6–10.4)
Creat: 0.79 mg/dL (ref 0.50–1.05)
GLUCOSE: 221 mg/dL — AB (ref 65–99)
POTASSIUM: 4 mmol/L (ref 3.5–5.3)
SODIUM: 136 mmol/L (ref 135–146)

## 2016-09-14 LAB — TSH: TSH: 0.89 m[IU]/L

## 2016-09-14 MED ORDER — FUROSEMIDE 20 MG PO TABS: 20.0000 mg | ORAL_TABLET | Freq: Every day | ORAL | 3 refills | Status: DC

## 2016-09-14 MED FILL — FUROSEMIDE 20 MG TABLET: 20 | 10 days supply | Qty: 10 | Fill #0

## 2016-09-14 NOTE — Progress Notes (Signed)
Patient presents to clinic today c/o intermittent swelling of feet and ankles bilaterally with some involvement up to the shins. Patient with a history of HTN, currently on regimen of losartan-HCTZ. Endorses taking as directed. Is staying sedentary most of the time. Denies change in diet. Patient without history of nephropathy, kidney disease  Past Medical History:  Diagnosis Date  . Anemia   . Arthritis    bilateral knees  . Bronchitis    hx of  . Fibroid tumor    Had partial hysterectomy  . Gallstones   . Migraine    hx of    Current Outpatient Prescriptions on File Prior to Visit  Medication Sig Dispense Refill  . Blood Glucose Monitoring Suppl (CONTOUR USB BLOOD GLUCOSE SYS) w/Device KIT Use daily as directed. 1 kit 0  . Cinnamon 500 MG capsule Take 1,000 mg by mouth 2 (two) times daily.    . cyclobenzaprine (FLEXERIL) 10 MG tablet TAKE 1 TABLET BY MOUTH AT BEDTIME 30 tablet 1  . Echinacea-Goldenseal (ECHINACEA COMB/GOLDEN SEAL PO) Take by mouth.    . fluticasone (FLONASE) 50 MCG/ACT nasal spray Place 2 sprays into both nostrils daily. (Patient taking differently: Place 2 sprays into both nostrils daily as needed. ) 16 g 2  . gabapentin (NEURONTIN) 100 MG capsule Take 1 capsule (100 mg total) by mouth 2 (two) times daily. 180 capsule 1  . GLUCOSAMINE PO Take 1 tablet by mouth 2 (two) times daily.    Marland Kitchen glucose blood (BAYER CONTOUR TEST) test strip Use twice daily as instructed to check sugars. Dx E. 11.9 100 each 12  . ibuprofen (ADVIL,MOTRIN) 200 MG tablet Take 200 mg by mouth every 6 (six) hours as needed. Reported on 02/13/2016    . loratadine (CLARITIN) 10 MG tablet Take 1 tablet (10 mg total) by mouth daily. 30 tablet 2  . losartan-hydrochlorothiazide (HYZAAR) 50-12.5 MG tablet Take 1 tablet by mouth daily. 30 tablet 3  . metFORMIN (GLUCOPHAGE) 500 MG tablet Take 1 tablet (500 mg total) by mouth 2 (two) times daily with a meal. 180 tablet 3  . Misc Natural Products (HERBAL  ENERGY COMPLEX PO) Take by mouth 2 (two) times daily with a meal.    . Multiple Vitamins-Minerals (WOMENS MULTIVITAMIN PO) Take by mouth daily.    . Omega-3 1000 MG CAPS Take 1 g by mouth daily.    [name] DELICA LANCETS FINE MISC Use as directed.  Dx:E11.9 100 each 12  . oxyCODONE-acetaminophen (PERCOCET) 10-325 MG tablet Take 1 tablet by mouth every 8 (eight) hours as needed for pain. 90 tablet 0  . saxagliptin HCl (ONGLYZA) 5 MG TABS tablet Take 1 tablet (5 mg total) by mouth daily. 30 tablet 11  . vitamin B-12 (CYANOCOBALAMIN) 100 MCG tablet Take 100 mcg by mouth daily.    . vitamin E 400 UNIT capsule Take 400 Units by mouth daily. Reported on 02/13/2016    . vitamin C (ASCORBIC ACID) 500 MG tablet Take 1,000 mg by mouth daily.     No current facility-administered medications on file prior to visit.     Allergies  Allergen Reactions  . Penicillins Hives    Hives   . Tramadol Itching    Family History  Problem Relation Age of Onset  . Diabetes Father 76    Deceased  . Diabetes Mother     Living  . Hyperlipidemia Mother   . Tuberculosis Mother   . Asthma Mother   . Diabetes Brother   .  Heart disease Maternal Aunt   . Arthritis Other     Maternal Aunts & Uncles  . Heart disease Maternal Uncle   . Colon cancer Maternal Uncle     dx in 92's  . Diabetes Sister   . Kidney failure Brother 30    Diseased  . Healthy Son     x1  . Esophageal cancer Neg Hx   . Rectal cancer Neg Hx   . Stomach cancer Neg Hx     Social History   Social History  . Marital status: Single    Spouse name: N/A  . Number of children: N/A  . Years of education: N/A   Social History Main Topics  . Smoking status: Former Smoker    Packs/day: 0.25    Years: 20.00    Types: Cigarettes    Quit date: 12/09/2009  . Smokeless tobacco: Never Used  . Alcohol use 0.0 oz/week     Comment: social  . Drug use: No  . Sexual activity: Not Currently   Other Topics Concern  . None   Social History  Narrative  . None    Review of Systems - See HPI.  All other ROS are negative.  BP 128/88 (BP Location: Left Arm, Patient Position: Sitting, Cuff Size: Large)   Pulse 95   Temp 97.9 F (36.6 C) (Oral)   Resp 16   Ht '5\' 6"'  (1.676 m)   Wt 221 lb 6 oz (100.4 kg)   SpO2 98%   BMI 35.73 kg/m   Physical Exam  Constitutional: She is oriented to person, place, and time and well-developed, well-nourished, and in no distress.  HENT:  Head: Normocephalic and atraumatic.  Eyes: Conjunctivae are normal.  Neck: Neck supple.  Cardiovascular: Normal rate, regular rhythm, normal heart sounds and intact distal pulses.   Pulses:      Popliteal pulses are 2+ on the right side, and 2+ on the left side.       Dorsalis pedis pulses are 2+ on the right side, and 2+ on the left side.       Posterior tibial pulses are 2+ on the right side, and 2+ on the left side.  1+ edema of feet and abkles bilatearlly. No edema noted at shin level or higher. No edema or upper extremities noted. Pulses 2+ and equal. Negative Homan sign bilaterally.  Pulmonary/Chest: Effort normal and breath sounds normal. No respiratory distress. She has no wheezes. She has no rales. She exhibits no tenderness.  Neurological: She is alert and oriented to person, place, and time.  Skin: Skin is warm and dry. No rash noted.  Psychiatric: Affect normal.  Vitals reviewed.   Assessment/Plan: 1. Pedal edema Mild edema noted. Discussed diet. Discussed activity to help with symptoms. Rx Lasix to use daily x 3 days. Then PRN if swelling recurs. Will check labs today. FU scheduled.  - furosemide (LASIX) 20 MG tablet; Take 1 tablet (20 mg total) by mouth daily.  Dispense: 10 tablet; Refill: 3 - Basic Metabolic Panel (BMET) - TSH   Leeanne Rio, PA-C

## 2016-09-14 NOTE — Patient Instructions (Signed)
Please go to the lab for blood work. I will call with your results. Take the Lasix daily x 2 days to alleviate fluid. Then only as needed if it recurs. Stay active and moving around to help prevent gravity from causing the swelling. Elevate legs while resting. Watch salt intake and processed foods.   Follow-up if not resolving.

## 2016-09-14 NOTE — Progress Notes (Signed)
Pre visit review using our clinic review tool, if applicable. No additional management support is needed unless otherwise documented below in the visit note/SLS  

## 2016-09-18 ENCOUNTER — Ambulatory Visit: Payer: BLUE CROSS/BLUE SHIELD | Admitting: Endocrinology

## 2016-09-21 ENCOUNTER — Ambulatory Visit: Payer: BLUE CROSS/BLUE SHIELD | Admitting: Endocrinology

## 2016-09-25 ENCOUNTER — Ambulatory Visit (INDEPENDENT_AMBULATORY_CARE_PROVIDER_SITE_OTHER): Payer: BLUE CROSS/BLUE SHIELD | Admitting: Endocrinology

## 2016-09-25 ENCOUNTER — Other Ambulatory Visit: Payer: Self-pay

## 2016-09-25 ENCOUNTER — Encounter: Payer: Self-pay | Admitting: Endocrinology

## 2016-09-25 VITALS — BP 114/64 | HR 103 | Ht 66.0 in | Wt 215.0 lb

## 2016-09-25 DIAGNOSIS — E119 Type 2 diabetes mellitus without complications: Secondary | ICD-10-CM

## 2016-09-25 LAB — POCT GLYCOSYLATED HEMOGLOBIN (HGB A1C): HEMOGLOBIN A1C: 9

## 2016-09-25 MED ORDER — GLIMEPIRIDE 4 MG PO TABS: 4.0000 mg | ORAL_TABLET | Freq: Every day | ORAL | 3 refills | Status: DC

## 2016-09-25 NOTE — Patient Instructions (Addendum)
check your blood sugar once a day.  vary the time of day when you check, between before the 3 meals, and at bedtime.  also check if you have symptoms of your blood sugar being too high or too low.  please keep a record of the readings and bring it to your next appointment here (or you can bring the meter itself).  You can write it on any piece of paper.  please call us sooner if your blood sugar goes below 70, or if you have a lot of readings over 200. i have sent a prescription to your pharmacy, for an additional diabetes pill.   Please come back for a follow-up appointment in 1 month.

## 2016-09-25 NOTE — Progress Notes (Signed)
Subjective:    Patient ID: [name], female    DOB: [birth year], 55 y.o.   MRN: [id number]  HPI Pt returns for f/u of diabetes mellitus: DM type: 2 Dx'ed: 2017, when she had a cbg of 400 on steroids Complications: none Therapy: 2 oral meds GDM: never DKA: never Severe hypoglycemia: never.   Pancreatitis: never.   Other: he took insulin for a brief time in early 2017.   Interval history: She says it was well-controlled until the past 4 days, when it went up to 500.  She is unable to cite precip factor.  No recent steroids.   Past Medical History:  Diagnosis Date  . Anemia   . Arthritis    bilateral knees  . Bronchitis    hx of  . Fibroid tumor    Had partial hysterectomy  . Gallstones   . Migraine    hx of    Past Surgical History:  Procedure Laterality Date  . ABDOMINAL HYSTERECTOMY     partial   . boil removed     . CARPAL TUNNEL RELEASE  04-15-15  . CHOLECYSTECTOMY  Oct 2012  . ELBOW SURGERY  04-15-15  . HAND SURGERY  04-15-15  . Nerve damage  04-15-15  . TOTAL KNEE ARTHROPLASTY  08/01/2012   Procedure: TOTAL KNEE ARTHROPLASTY;  Surgeon: Augustin Schooling, MD;  Location: [facility];  Service: Orthopedics;  Laterality: Right;  RIGHT TOTAL KNEE ARTHROPLASTY  . TRIGGER FINGER RELEASE  04-15-15  . TUBAL LIGATION    . WISDOM TOOTH EXTRACTION      Social History   Social History  . Marital status: Single    Spouse name: N/A  . Number of children: N/A  . Years of education: N/A   Occupational History  . Not on file.   Social History Main Topics  . Smoking status: Former Smoker    Packs/day: 0.25    Years: 20.00    Types: Cigarettes    Quit date: 12/09/2009  . Smokeless tobacco: Never Used  . Alcohol use 0.0 oz/week     Comment: social  . Drug use: No  . Sexual activity: Not Currently   Other Topics Concern  . Not on file   Social History Narrative  . No narrative on file    Current Outpatient Prescriptions on File Prior to Visit  Medication Sig Dispense  Refill  . Blood Glucose Monitoring Suppl (CONTOUR USB BLOOD GLUCOSE SYS) w/Device KIT Use daily as directed. 1 kit 0  . Cinnamon 500 MG capsule Take 1,000 mg by mouth 2 (two) times daily.    . cyclobenzaprine (FLEXERIL) 10 MG tablet TAKE 1 TABLET BY MOUTH AT BEDTIME 30 tablet 1  . Echinacea-Goldenseal (ECHINACEA COMB/GOLDEN SEAL PO) Take by mouth.    . fluticasone (FLONASE) 50 MCG/ACT nasal spray Place 2 sprays into both nostrils daily. (Patient taking differently: Place 2 sprays into both nostrils daily as needed. ) 16 g 2  . gabapentin (NEURONTIN) 100 MG capsule Take 1 capsule (100 mg total) by mouth 2 (two) times daily. 180 capsule 1  . GLUCOSAMINE PO Take 1 tablet by mouth 2 (two) times daily.    Marland Kitchen glucose blood (BAYER CONTOUR TEST) test strip Use twice daily as instructed to check sugars. Dx E. 11.9 100 each 12  . ibuprofen (ADVIL,MOTRIN) 200 MG tablet Take 200 mg by mouth every 6 (six) hours as needed. Reported on 02/13/2016    . loratadine (CLARITIN) 10 MG tablet Take 1 tablet (  10 mg total) by mouth daily. 30 tablet 2  . losartan-hydrochlorothiazide (HYZAAR) 50-12.5 MG tablet Take 1 tablet by mouth daily. 30 tablet 3  . metFORMIN (GLUCOPHAGE) 500 MG tablet Take 1 tablet (500 mg total) by mouth 2 (two) times daily with a meal. 180 tablet 3  . Misc Natural Products (HERBAL ENERGY COMPLEX PO) Take by mouth 2 (two) times daily with a meal.    . Multiple Vitamins-Minerals (WOMENS MULTIVITAMIN PO) Take by mouth daily.    . Omega-3 1000 MG CAPS Take 1 g by mouth daily.    [name] DELICA LANCETS FINE MISC Use as directed.  Dx:E11.9 100 each 12  . oxyCODONE-acetaminophen (PERCOCET) 10-325 MG tablet Take 1 tablet by mouth every 8 (eight) hours as needed for pain. 90 tablet 0  . saxagliptin HCl (ONGLYZA) 5 MG TABS tablet Take 1 tablet (5 mg total) by mouth daily. 30 tablet 11  . vitamin B-12 (CYANOCOBALAMIN) 100 MCG tablet Take 100 mcg by mouth daily.    . vitamin C (ASCORBIC ACID) 500 MG tablet  Take 1,000 mg by mouth daily.    . vitamin E 400 UNIT capsule Take 400 Units by mouth daily. Reported on 02/13/2016    . furosemide (LASIX) 20 MG tablet Take 1 tablet (20 mg total) by mouth daily. (Patient not taking: Reported on 09/25/2016) 10 tablet 3   No current facility-administered medications on file prior to visit.     Allergies  Allergen Reactions  . Penicillins Hives    Hives   . Tramadol Itching    Family History  Problem Relation Age of Onset  . Diabetes Father 1    Deceased  . Diabetes Mother     Living  . Hyperlipidemia Mother   . Tuberculosis Mother   . Asthma Mother   . Diabetes Brother   . Heart disease Maternal Aunt   . Arthritis Other     Maternal Aunts & Uncles  . Heart disease Maternal Uncle   . Colon cancer Maternal Uncle     dx in 51's  . Diabetes Sister   . Kidney failure Brother 30    Diseased  . Healthy Son     x1  . Esophageal cancer Neg Hx   . Rectal cancer Neg Hx   . Stomach cancer Neg Hx     BP 114/64   Pulse (!) 103   Ht '5\' 6"'  (1.676 m)   Wt 215 lb (97.5 kg)   SpO2 92%   BMI 34.70 kg/m   Review of Systems She denies hypoglycemia.      Objective:   Physical Exam VITAL SIGNS:  See vs page GENERAL: no distress Pulses: dorsalis pedis intact bilat.   MSK: no deformity of the feet CV: no leg edema Skin:  no ulcer on the feet.  normal color and temp on the feet.  Neuro: sensation is intact to touch on the feet.   A1c=9.0% Lab Results  Component Value Date   CREATININE 0.79 09/14/2016   BUN 14 09/14/2016   NA 136 09/14/2016   K 4.0 09/14/2016   CL 101 09/14/2016   CO2 26 09/14/2016      Assessment & Plan:  Type 2 DM: she needs increased rx.  We discussed rx options.  She declines to resume insulin.    Patient is advised the following: Patient Instructions  check your blood sugar once a day.  vary the time of day when you check, between before the 3 meals, and  at bedtime.  also check if you have symptoms of your blood  sugar being too high or too low.  please keep a record of the readings and bring it to your next appointment here (or you can bring the meter itself).  You can write it on any piece of paper.  please call us sooner if your blood sugar goes below 70, or if you have a lot of readings over 200. i have sent a prescription to your pharmacy, for an additional diabetes pill.   Please come back for a follow-up appointment in 1 month.

## 2016-10-05 ENCOUNTER — Ambulatory Visit (INDEPENDENT_AMBULATORY_CARE_PROVIDER_SITE_OTHER): Payer: BLUE CROSS/BLUE SHIELD | Admitting: Family Medicine

## 2016-10-05 ENCOUNTER — Ambulatory Visit: Payer: BLUE CROSS/BLUE SHIELD | Admitting: Medical

## 2016-10-05 ENCOUNTER — Encounter: Payer: Self-pay | Admitting: Family Medicine

## 2016-10-05 VITALS — BP 124/72 | HR 56 | Temp 98.2°F | Resp 14 | Ht 66.0 in | Wt 218.5 lb

## 2016-10-05 DIAGNOSIS — L03012 Cellulitis of left finger: Secondary | ICD-10-CM

## 2016-10-05 MED ORDER — DOXYCYCLINE HYCLATE 100 MG PO CAPS: 100.0000 mg | ORAL_CAPSULE | Freq: Two times a day (BID) | ORAL | 0 refills | Status: AC

## 2016-10-05 MED FILL — DOXYCYCLINE HYCLATE 100 MG: 100 | 7 days supply | Qty: 14 | Fill #0

## 2016-10-05 NOTE — Patient Instructions (Signed)
If you are not better by Monday, call the office for an appointment.  Keep soaking your finger in iodone or sitz soaks for 10-15 minutes twice daily.

## 2016-10-05 NOTE — Progress Notes (Signed)
Chief Complaint  Patient presents with  . Nail Problem    "hang nail" L hand, 4th finger    Subjective: Patient is a 55 y.o. female here for a nail problem.  She was picking at her nails a couple days ago and felt some pain. She ignored it, but noticed some pain and swelling on her L ring finger. Has not tried anything at home. Denies redness, streaking, or fevers.  ROS: Const: No fever  Family History  Problem Relation Age of Onset  . Diabetes Father 46    Deceased  . Diabetes Mother     Living  . Hyperlipidemia Mother   . Tuberculosis Mother   . Asthma Mother   . Heart disease Maternal Uncle   . Colon cancer Maternal Uncle     dx in 22's  . Diabetes Brother   . Heart disease Maternal Aunt   . Arthritis Other     Maternal Aunts & Uncles  . Diabetes Sister   . Kidney failure Brother 30    Diseased  . Healthy Son     x1  . Esophageal cancer Neg Hx   . Rectal cancer Neg Hx   . Stomach cancer Neg Hx    Past Medical History:  Diagnosis Date  . Anemia   . Arthritis    bilateral knees  . Bronchitis    hx of  . Fibroid tumor    Had partial hysterectomy  . Gallstones   . Migraine    hx of   Allergies  Allergen Reactions  . Penicillins Hives    Hives   . Tramadol Itching    Current Outpatient Prescriptions:  .  Cinnamon 500 MG capsule, Take 1,000 mg by mouth 2 (two) times daily., Disp: , Rfl:  .  cyclobenzaprine (FLEXERIL) 10 MG tablet, TAKE 1 TABLET BY MOUTH AT BEDTIME, Disp: 30 tablet, Rfl: 1 .  Echinacea-Goldenseal (ECHINACEA COMB/GOLDEN SEAL PO), Take by mouth., Disp: , Rfl:  .  furosemide (LASIX) 20 MG tablet, Take 1 tablet (20 mg total) by mouth daily., Disp: 10 tablet, Rfl: 3 .  gabapentin (NEURONTIN) 100 MG capsule, Take 1 capsule (100 mg total) by mouth 2 (two) times daily., Disp: 180 capsule, Rfl: 1 .  glimepiride (AMARYL) 4 MG tablet, Take 1 tablet (4 mg total) by mouth daily before breakfast., Disp: 30 tablet, Rfl: 3 .  GLUCOSAMINE PO, Take 1  tablet by mouth 2 (two) times daily., Disp: , Rfl:  .  ibuprofen (ADVIL,MOTRIN) 200 MG tablet, Take 200 mg by mouth every 6 (six) hours as needed. Reported on 02/13/2016, Disp: , Rfl:  .  loratadine (CLARITIN) 10 MG tablet, Take 1 tablet (10 mg total) by mouth daily., Disp: 30 tablet, Rfl: 2 .  losartan-hydrochlorothiazide (HYZAAR) 50-12.5 MG tablet, Take 1 tablet by mouth daily., Disp: 30 tablet, Rfl: 3 .  metFORMIN (GLUCOPHAGE) 500 MG tablet, Take 1 tablet (500 mg total) by mouth 2 (two) times daily with a meal., Disp: 180 tablet, Rfl: 3 .  Misc Natural Products (HERBAL ENERGY COMPLEX PO), Take by mouth 2 (two) times daily with a meal., Disp: , Rfl:  .  Multiple Vitamins-Minerals (WOMENS MULTIVITAMIN PO), Take by mouth daily., Disp: , Rfl:  .  Omega-3 1000 MG CAPS, Take 1 g by mouth daily., Disp: , Rfl:  .  oxyCODONE-acetaminophen (PERCOCET) 10-325 MG tablet, Take 1 tablet by mouth every 8 (eight) hours as needed for pain., Disp: 90 tablet, Rfl: 0 .  saxagliptin HCl (ONGLYZA) 5  MG TABS tablet, Take 1 tablet (5 mg total) by mouth daily., Disp: 30 tablet, Rfl: 11 .  vitamin B-12 (CYANOCOBALAMIN) 100 MCG tablet, Take 100 mcg by mouth daily., Disp: , Rfl:  .  vitamin C (ASCORBIC ACID) 500 MG tablet, Take 1,000 mg by mouth daily., Disp: , Rfl:  .  vitamin E 400 UNIT capsule, Take 400 Units by mouth daily. Reported on 02/13/2016, Disp: , Rfl:  .  Blood Glucose Monitoring Suppl (CONTOUR USB BLOOD GLUCOSE SYS) w/Device KIT, Use daily as directed. (Patient not taking: Reported on 10/05/2016), Disp: 1 kit, Rfl: 0 .  doxycycline (VIBRAMYCIN) 100 MG capsule, Take 1 capsule (100 mg total) by mouth 2 (two) times daily., Disp: 14 capsule, Rfl: 0 .  fluticasone (FLONASE) 50 MCG/ACT nasal spray, Place 2 sprays into both nostrils daily. (Patient not taking: Reported on 10/05/2016), Disp: 16 g, Rfl: 2 .  glucose blood (BAYER CONTOUR TEST) test strip, Use twice daily as instructed to check sugars. Dx E. 11.9 (Patient not  taking: Reported on 10/05/2016), Disp: 100 each, Rfl: 12 .  ONETOUCH DELICA LANCETS FINE MISC, Use as directed.  Dx:E11.9 (Patient not taking: Reported on 10/05/2016), Disp: 100 each, Rfl: 12  Objective: BP 124/72 (BP Location: Left Arm, Patient Position: Sitting, Cuff Size: Normal)   Pulse (!) 56   Temp 98.2 F (36.8 C) (Oral)   Resp 14   Ht _0  (1.676 m)   Wt 218 lb 8 oz (99.1 kg)   SpO2 96%   BMI 35.27 kg/m  General: Awake, appears stated age Heart: RRR, no murmurs Lungs: CTAB, no rales, wheezes or rhonchi. No accessory muscle use Skin: Lateral nail pulp of L 4th digit TTP, warm and mild swelling. No erythema or decreased joint motion. Psych: Age appropriate judgment and insight, normal affect and mood  Assessment and Plan: Paronychia of finger of left hand - Plan: doxycycline (VIBRAMYCIN) 100 MG capsule  Orders as above. Soaks. If not better by Monday, schedule appt. Will need to open it up. Pt was against this today so we will try abx.  The patient voiced understanding and agreement to the plan.  Robinson [place], DO 10/05/16  3:41 PM

## 2016-10-05 NOTE — Progress Notes (Signed)
Pre visit review using our clinic review tool, if applicable. No additional management support is needed unless otherwise documented below in the visit note. 

## 2016-10-06 ENCOUNTER — Other Ambulatory Visit: Payer: Self-pay | Admitting: Endocrinology

## 2016-10-07 NOTE — Telephone Encounter (Signed)
Please refer request to PCP 

## 2016-10-14 ENCOUNTER — Other Ambulatory Visit: Payer: Self-pay | Admitting: Endocrinology

## 2016-10-14 NOTE — Progress Notes (Signed)
erroneous

## 2016-10-15 ENCOUNTER — Encounter: Payer: Self-pay | Admitting: Physician Assistant

## 2016-10-15 ENCOUNTER — Ambulatory Visit (INDEPENDENT_AMBULATORY_CARE_PROVIDER_SITE_OTHER): Payer: BLUE CROSS/BLUE SHIELD | Admitting: Physician Assistant

## 2016-10-15 VITALS — BP 116/78 | HR 81 | Temp 98.0°F | Resp 16 | Ht 66.0 in | Wt 218.1 lb

## 2016-10-15 DIAGNOSIS — L03012 Cellulitis of left finger: Secondary | ICD-10-CM | POA: Diagnosis not present

## 2016-10-15 MED ORDER — LOSARTAN POTASSIUM-HCTZ 50-12.5 MG PO TABS: 1.0000 | ORAL_TABLET | Freq: Every day | ORAL | 3 refills | Status: DC

## 2016-10-15 MED ORDER — FLUCONAZOLE 150 MG PO TABS: 150.0000 mg | ORAL_TABLET | Freq: Once | ORAL | 0 refills | Status: AC

## 2016-10-15 MED ORDER — CYCLOBENZAPRINE HCL 10 MG PO TABS
10.0000 mg | ORAL_TABLET | Freq: Every day | ORAL | 1 refills | Status: DC
Start: 2016-10-15 — End: 2017-02-25

## 2016-10-15 NOTE — Progress Notes (Signed)
Patient presents to clinic today for follow-up of paronychia of left 4th phalanx. Patient initially evaluated on 10/05/16 by Dr. Nani Ravens. Patient was started on Doxycycline and given supportive measures to follow. Was told to follow-up if symptoms were not resolving. Patient endorses symptoms improved with antibiotic. Notes that the area popped a few days after initial visit. Denies any residual drainage. Denies fever, chills, malaise. Has completed antibiotic. Notes mild vaginal itching. Has significant hx of yeast vaginitis with ABX use.Marland Kitchen   Past Medical History:  Diagnosis Date  . Anemia   . Arthritis    bilateral knees  . Bronchitis    hx of  . Fibroid tumor    Had partial hysterectomy  . Gallstones   . Migraine    hx of    Current Outpatient Prescriptions on File Prior to Visit  Medication Sig Dispense Refill  . Blood Glucose Monitoring Suppl (CONTOUR USB BLOOD GLUCOSE SYS) w/Device KIT Use daily as directed. (Patient not taking: Reported on 10/05/2016) 1 kit 0  . Cinnamon 500 MG capsule Take 1,000 mg by mouth 2 (two) times daily.    . cyclobenzaprine (FLEXERIL) 10 MG tablet TAKE 1 TABLET BY MOUTH AT BEDTIME 30 tablet 1  . Echinacea-Goldenseal (ECHINACEA COMB/GOLDEN SEAL PO) Take by mouth.    . fluticasone (FLONASE) 50 MCG/ACT nasal spray Place 2 sprays into both nostrils daily. (Patient not taking: Reported on 10/05/2016) 16 g 2  . furosemide (LASIX) 20 MG tablet Take 1 tablet (20 mg total) by mouth daily. 10 tablet 3  . gabapentin (NEURONTIN) 100 MG capsule Take 1 capsule (100 mg total) by mouth 2 (two) times daily. 180 capsule 1  . glimepiride (AMARYL) 4 MG tablet Take 1 tablet (4 mg total) by mouth daily before breakfast. 30 tablet 3  . GLUCOSAMINE PO Take 1 tablet by mouth 2 (two) times daily.    Marland Kitchen glucose blood (BAYER CONTOUR TEST) test strip Use twice daily as instructed to check sugars. Dx E. 11.9 (Patient not taking: Reported on 10/05/2016) 100 each 12  . ibuprofen  (ADVIL,MOTRIN) 200 MG tablet Take 200 mg by mouth every 6 (six) hours as needed. Reported on 02/13/2016    . loratadine (CLARITIN) 10 MG tablet Take 1 tablet (10 mg total) by mouth daily. 30 tablet 2  . losartan-hydrochlorothiazide (HYZAAR) 50-12.5 MG tablet TAKE 1 TABLET BY MOUTH DAILY. 30 tablet 3  . metFORMIN (GLUCOPHAGE) 500 MG tablet Take 1 tablet (500 mg total) by mouth 2 (two) times daily with a meal. 180 tablet 3  . Misc Natural Products (HERBAL ENERGY COMPLEX PO) Take by mouth 2 (two) times daily with a meal.    . Multiple Vitamins-Minerals (WOMENS MULTIVITAMIN PO) Take by mouth daily.    . Omega-3 1000 MG CAPS Take 1 g by mouth daily.    [name] DELICA LANCETS FINE MISC Use as directed.  Dx:E11.9 (Patient not taking: Reported on 10/05/2016) 100 each 12  . oxyCODONE-acetaminophen (PERCOCET) 10-325 MG tablet Take 1 tablet by mouth every 8 (eight) hours as needed for pain. 90 tablet 0  . saxagliptin HCl (ONGLYZA) 5 MG TABS tablet Take 1 tablet (5 mg total) by mouth daily. 30 tablet 11  . vitamin B-12 (CYANOCOBALAMIN) 100 MCG tablet Take 100 mcg by mouth daily.    . vitamin C (ASCORBIC ACID) 500 MG tablet Take 1,000 mg by mouth daily.    . vitamin E 400 UNIT capsule Take 400 Units by mouth daily. Reported on 02/13/2016  No current facility-administered medications on file prior to visit.     Allergies  Allergen Reactions  . Penicillins Hives    Hives   . Tramadol Itching    Family History  Problem Relation Age of Onset  . Diabetes Father 56    Deceased  . Diabetes Mother     Living  . Hyperlipidemia Mother   . Tuberculosis Mother   . Asthma Mother   . Heart disease Maternal Uncle   . Colon cancer Maternal Uncle     dx in 68's  . Diabetes Brother   . Heart disease Maternal Aunt   . Arthritis Other     Maternal Aunts & Uncles  . Diabetes Sister   . Kidney failure Brother 30    Diseased  . Healthy Son     x1  . Esophageal cancer Neg Hx   . Rectal cancer Neg Hx   .  Stomach cancer Neg Hx     Social History   Social History  . Marital status: Single    Spouse name: N/A  . Number of children: N/A  . Years of education: N/A   Social History Main Topics  . Smoking status: Former Smoker    Packs/day: 0.25    Years: 20.00    Types: Cigarettes    Quit date: 12/09/2009  . Smokeless tobacco: Never Used  . Alcohol use 0.0 oz/week     Comment: social  . Drug use: No  . Sexual activity: Not Currently   Other Topics Concern  . None   Social History Narrative  . None   Review of Systems - See HPI.  All other ROS are negative.  Ht '5\' 6"'  (1.676 m)   Wt 218 lb 2 oz (98.9 kg)   BMI 35.21 kg/m   Physical Exam  Constitutional: She is well-developed, well-nourished, and in no distress.  HENT:  Head: Normocephalic and atraumatic.  Eyes: Conjunctivae are normal.  Cardiovascular: Normal rate, regular rhythm, normal heart sounds and intact distal pulses.   Pulmonary/Chest: Effort normal and breath sounds normal. No respiratory distress. She has no wheezes. She has no rales. She exhibits no tenderness.  Skin: Skin is warm and dry. No rash noted.  Psychiatric: Affect normal.  Vitals reviewed.  Recent Results (from the past 2160 hour(s))  Basic Metabolic Panel (BMET)     Status: Abnormal   Collection Time: 09/14/16  4:02 PM  Result Value Ref Range   Sodium 136 135 - 146 mmol/L   Potassium 4.0 3.5 - 5.3 mmol/L   Chloride 101 98 - 110 mmol/L   CO2 26 20 - 31 mmol/L   Glucose, Bld 221 (H) 65 - 99 mg/dL   BUN 14 7 - 25 mg/dL   Creat 0.79 0.50 - 1.05 mg/dL    Comment:   For patients > or = 57 years of age: The upper reference limit for Creatinine is approximately 13% higher for people identified as African-American.      Calcium 10.0 8.6 - 10.4 mg/dL  TSH     Status: None   Collection Time: 09/14/16  4:02 PM  Result Value Ref Range   TSH 0.89 mIU/L    Comment:   Reference Range   > or = 20 Years  0.40-4.50   Pregnancy Range First  trimester  0.26-2.66 Second trimester 0.55-2.73 Third trimester  0.43-2.91     POCT glycosylated hemoglobin (Hb A1C)     Status: None   Collection Time: 09/25/16  3:12  PM  Result Value Ref Range   Hemoglobin A1C 9.0     Assessment/Plan: 1. Paronychia of finger of left hand Resolved. Ruptured on its own about a week ago. ABX completed. Continue supportive measures. Monitor for recurrence. Diflucan sent in for potential yeast 2/2 ABX use.   Leeanne Rio, PA-C

## 2016-10-15 NOTE — Progress Notes (Signed)
Pre visit review using our clinic review tool, if applicable. No additional management support is needed unless otherwise documented below in the visit note/SLS  

## 2016-10-15 NOTE — Patient Instructions (Signed)
Refills have been sent to your pharmacy.   The area looks good overall. No major tenderness to the area. I do not see current signs of persistent infection. The residual blister will resolve over the next week. Keep up with the soaks once daily. Keep skin clean and dry otherwise. If you note any recurrence of symptoms, please come see [place] ASAP.  The Diflucan has been sent in to help with yeast from antibiotic use. If not resolving, please come see [place].

## 2016-10-21 NOTE — Progress Notes (Signed)
Subjective:    Patient ID: [name], female    DOB: Jul 03, 1961, 55 y.o.   MRN: [id number]  HPI Pt returns for f/u of diabetes mellitus: DM type: 2 Dx'ed: 2017, when she had a cbg of 400 on steroids.  Complications: none Therapy: 3 oral meds.  GDM: never.  DKA: never. Severe hypoglycemia: never.   Pancreatitis: never.   Other: she took insulin for a brief time in early 2017.   Interval history: no cbg record, but states cbg's are in the low-100's.  pt states he feels well in general. Past Medical History:  Diagnosis Date  . Anemia   . Arthritis    bilateral knees  . Bronchitis    hx of  . Fibroid tumor    Had partial hysterectomy  . Gallstones   . Migraine    hx of    Past Surgical History:  Procedure Laterality Date  . ABDOMINAL HYSTERECTOMY     partial   . boil removed     . CARPAL TUNNEL RELEASE  04-15-15  . CHOLECYSTECTOMY  Oct 2012  . ELBOW SURGERY  04-15-15  . HAND SURGERY  04-15-15  . Nerve damage  04-15-15  . TOTAL KNEE ARTHROPLASTY  08/01/2012   Procedure: TOTAL KNEE ARTHROPLASTY;  Surgeon: Augustin Schooling, MD;  Location: [facility];  Service: Orthopedics;  Laterality: Right;  RIGHT TOTAL KNEE ARTHROPLASTY  . TRIGGER FINGER RELEASE  04-15-15  . TUBAL LIGATION    . WISDOM TOOTH EXTRACTION      Social History   Social History  . Marital status: Single    Spouse name: N/A  . Number of children: N/A  . Years of education: N/A   Occupational History  . Not on file.   Social History Main Topics  . Smoking status: Former Smoker    Packs/day: 0.25    Years: 20.00    Types: Cigarettes    Quit date: 12/09/2009  . Smokeless tobacco: Never Used  . Alcohol use 0.0 oz/week     Comment: social  . Drug use: No  . Sexual activity: Not Currently   Other Topics Concern  . Not on file   Social History Narrative  . No narrative on file    Current Outpatient Prescriptions on File Prior to Visit  Medication Sig Dispense Refill  . Blood Glucose Monitoring Suppl  (CONTOUR USB BLOOD GLUCOSE SYS) w/Device KIT Use daily as directed. 1 kit 0  . Cinnamon 500 MG capsule Take 1,000 mg by mouth 2 (two) times daily.    . cyclobenzaprine (FLEXERIL) 10 MG tablet Take 1 tablet (10 mg total) by mouth at bedtime. 30 tablet 1  . Echinacea-Goldenseal (ECHINACEA COMB/GOLDEN SEAL PO) Take by mouth.    . fluticasone (FLONASE) 50 MCG/ACT nasal spray Place 2 sprays into both nostrils daily. 16 g 2  . furosemide (LASIX) 20 MG tablet Take 1 tablet (20 mg total) by mouth daily. 10 tablet 3  . gabapentin (NEURONTIN) 100 MG capsule Take 1 capsule (100 mg total) by mouth 2 (two) times daily. 180 capsule 1  . glimepiride (AMARYL) 4 MG tablet Take 1 tablet (4 mg total) by mouth daily before breakfast. 30 tablet 3  . GLUCOSAMINE PO Take 1 tablet by mouth 2 (two) times daily.    Marland Kitchen glucose blood (BAYER CONTOUR TEST) test strip Use twice daily as instructed to check sugars. Dx E. 11.9 100 each 12  . ibuprofen (ADVIL,MOTRIN) 200 MG tablet Take 200 mg by mouth  every 6 (six) hours as needed. Reported on 02/13/2016    . loratadine (CLARITIN) 10 MG tablet Take 1 tablet (10 mg total) by mouth daily. 30 tablet 2  . losartan-hydrochlorothiazide (HYZAAR) 50-12.5 MG tablet Take 1 tablet by mouth daily. 30 tablet 3  . metFORMIN (GLUCOPHAGE) 500 MG tablet Take 1 tablet (500 mg total) by mouth 2 (two) times daily with a meal. 180 tablet 3  . Misc Natural Products (HERBAL ENERGY COMPLEX PO) Take by mouth 2 (two) times daily with a meal.    . Multiple Vitamins-Minerals (WOMENS MULTIVITAMIN PO) Take by mouth daily.    . Omega-3 1000 MG CAPS Take 1 g by mouth daily.    [name] DELICA LANCETS FINE MISC Use as directed.  Dx:E11.9 100 each 12  . oxyCODONE-acetaminophen (PERCOCET) 10-325 MG tablet Take 1 tablet by mouth every 8 (eight) hours as needed for pain. 90 tablet 0  . saxagliptin HCl (ONGLYZA) 5 MG TABS tablet Take 1 tablet (5 mg total) by mouth daily. 30 tablet 11  . vitamin B-12 (CYANOCOBALAMIN)  100 MCG tablet Take 100 mcg by mouth daily.    . vitamin C (ASCORBIC ACID) 500 MG tablet Take 1,000 mg by mouth daily.    . vitamin E 400 UNIT capsule Take 400 Units by mouth daily. Reported on 02/13/2016     No current facility-administered medications on file prior to visit.     Allergies  Allergen Reactions  . Penicillins Hives    Hives   . Tramadol Itching    Family History  Problem Relation Age of Onset  . Diabetes Father 41    Deceased  . Diabetes Mother     Living  . Hyperlipidemia Mother   . Tuberculosis Mother   . Asthma Mother   . Heart disease Maternal Uncle   . Colon cancer Maternal Uncle     dx in 64's  . Diabetes Brother   . Heart disease Maternal Aunt   . Arthritis Other     Maternal Aunts & Uncles  . Diabetes Sister   . Kidney failure Brother 30    Diseased  . Healthy Son     x1  . Esophageal cancer Neg Hx   . Rectal cancer Neg Hx   . Stomach cancer Neg Hx    BP 132/84   Pulse 96   Ht '5\' 6"'  (1.676 m)   Wt 212 lb (96.2 kg)   SpO2 96%   BMI 34.22 kg/m   Review of Systems She denies hypoglycemia.    Objective:   Physical Exam VITAL SIGNS:  See vs page GENERAL: no distress Pulses: dorsalis pedis intact bilat.   MSK: no deformity of the feet CV: no leg edema Skin:  no ulcer on the feet.  normal color and temp on the feet. Neuro: sensation is intact to touch on the feet.   Fructosamine=[id number]    Assessment & Plan:  Type 2 DM: poor control.  Fructosamine is much higher than would be suggested by reported cbg's.  I advised insulin.    Patient is advised the following: Patient Instructions  check your blood sugar once a day.  vary the time of day when you check, between before the 3 meals, and at bedtime.  also check if you have symptoms of your blood sugar being too high or too low.  please keep a record of the readings and bring it to your next appointment here (or you can bring the meter itself).  You  can write it on any piece of paper.  please  call us sooner if your blood sugar goes below 70, or if you have a lot of readings over 200.  blood tests are requested for you today.  We'll let you know about the results. Please come back for a follow-up appointment in 3-4 months.

## 2016-10-23 ENCOUNTER — Encounter: Payer: Self-pay | Admitting: Endocrinology

## 2016-10-23 ENCOUNTER — Ambulatory Visit (INDEPENDENT_AMBULATORY_CARE_PROVIDER_SITE_OTHER): Payer: BLUE CROSS/BLUE SHIELD | Admitting: Endocrinology

## 2016-10-23 VITALS — BP 132/84 | HR 96 | Ht 66.0 in | Wt 212.0 lb

## 2016-10-23 DIAGNOSIS — E119 Type 2 diabetes mellitus without complications: Secondary | ICD-10-CM

## 2016-10-23 NOTE — Patient Instructions (Addendum)
check your blood sugar once a day.  vary the time of day when you check, between before the 3 meals, and at bedtime.  also check if you have symptoms of your blood sugar being too high or too low.  please keep a record of the readings and bring it to your next appointment here (or you can bring the meter itself).  You can write it on any piece of paper.  please call us sooner if your blood sugar goes below 70, or if you have a lot of readings over 200.  blood tests are requested for you today.  We'll let you know about the results. Please come back for a follow-up appointment in 3-4 months. 

## 2016-10-25 LAB — FRUCTOSAMINE: Fructosamine: 424 umol/L — ABNORMAL HIGH (ref 190–270)

## 2016-10-30 ENCOUNTER — Telehealth: Payer: Self-pay | Admitting: Physician Assistant

## 2016-10-30 NOTE — Telephone Encounter (Signed)
She should have completed the Doxycycline given by Dr. Nani Ravens a few weeks ago. If she has already taken two Diflucan without improvement, she needs assessment in office so we can start appropriate treatment.

## 2016-10-30 NOTE — Telephone Encounter (Signed)
Pt asking that [facility] call her back, Pt thinks she has a uti or yeast infection and asking what can she do for it.

## 2016-10-30 NOTE — Telephone Encounter (Signed)
Patient states she is having itching, no burning, no discharge. Started 2 days ago. She has not tried anything OTC. She finished the Doxycycline about 4 days ago. She did take 2 doses of the Fluconazole. Please advise

## 2016-10-30 NOTE — Telephone Encounter (Signed)
Advised patient she needs re-evaluation for treatment. She is scheduled for 10:15 tomorrow.

## 2016-10-31 ENCOUNTER — Other Ambulatory Visit: Payer: Self-pay | Admitting: Emergency Medicine

## 2016-10-31 ENCOUNTER — Encounter: Payer: Self-pay | Admitting: Physician Assistant

## 2016-10-31 ENCOUNTER — Telehealth: Payer: Self-pay | Admitting: Physician Assistant

## 2016-10-31 ENCOUNTER — Other Ambulatory Visit (HOSPITAL_COMMUNITY)
Admission: RE | Admit: 2016-10-31 | Discharge: 2016-10-31 | Disposition: A | Discharge status: Home / Self Care | Payer: BLUE CROSS/BLUE SHIELD | Source: Ambulatory Visit | Attending: Physician Assistant | Admitting: Physician Assistant

## 2016-10-31 ENCOUNTER — Ambulatory Visit (INDEPENDENT_AMBULATORY_CARE_PROVIDER_SITE_OTHER): Payer: BLUE CROSS/BLUE SHIELD | Admitting: Physician Assistant

## 2016-10-31 VITALS — BP 118/78 | HR 98 | Temp 98.1°F | Resp 14 | Ht 66.0 in | Wt 210.0 lb

## 2016-10-31 DIAGNOSIS — L298 Other pruritus: Secondary | ICD-10-CM | POA: Insufficient documentation

## 2016-10-31 DIAGNOSIS — R739 Hyperglycemia, unspecified: Secondary | ICD-10-CM | POA: Diagnosis not present

## 2016-10-31 DIAGNOSIS — N898 Other specified noninflammatory disorders of vagina: Secondary | ICD-10-CM

## 2016-10-31 LAB — CBC
HEMATOCRIT: 38.6 % (ref 36.0–46.0)
HEMOGLOBIN: 12.8 g/dL (ref 12.0–15.0)
MCHC: 33.2 g/dL (ref 30.0–36.0)
MCV: 86.6 fl (ref 78.0–100.0)
PLATELETS: 307 10*3/uL (ref 150.0–400.0)
RBC: 4.46 Mil/uL (ref 3.87–5.11)
RDW: 13.2 % (ref 11.5–15.5)
WBC: 7.5 10*3/uL (ref 4.0–10.5)

## 2016-10-31 LAB — POCT URINALYSIS DIPSTICK
BILIRUBIN UA: NEGATIVE
Ketones, UA: NEGATIVE
LEUKOCYTES UA: NEGATIVE
NITRITE UA: NEGATIVE
Protein, UA: 15
RBC UA: NEGATIVE
Spec Grav, UA: 1.02
Urobilinogen, UA: 0.2
pH, UA: 5

## 2016-10-31 LAB — GLUCOSE, POCT (MANUAL RESULT ENTRY): POC Glucose: 363 mg/dl — AB (ref 70–99)

## 2016-10-31 MED ORDER — FLUCONAZOLE 150 MG PO TABS: 150.0000 mg | ORAL_TABLET | Freq: Once | ORAL | 0 refills | Status: AC

## 2016-10-31 MED ORDER — FLUCONAZOLE 150 MG PO TABS
150.0000 mg | ORAL_TABLET | Freq: Once | ORAL | 0 refills | Status: DC
Start: 2016-10-31 — End: 2016-10-31

## 2016-10-31 NOTE — Progress Notes (Signed)
Patient presents to clinic today c/o 4 days of vaginal itching described as more on the inside. Denies vaginal discharge, pain. Denies urinary urgency, frequency, dysuria, hematuria. Is not sexually active. Has been staying well hydrated. Was on antibiotic 1 month ago for paronychia. Was given Diflucan at the time. No antibiotic since then.  Has history of diabetes, uncontrolled. Followed by Endocrinology. Last time she checked sugars was last night and was at 310. Denies recent change to diet. Endorses taking all medications as directed. Just restarted her Tyler Aas a couple of days ago at 24 units. Denies polyuria, polydipsia or polyphagia. Denies palpitations, lightheadedness or dizziness. Denies change to diet or exercise regimen. Has not checked sugar level yet today.   Past Medical History:  Diagnosis Date  . Anemia   . Arthritis    bilateral knees  . Bronchitis    hx of  . Fibroid tumor    Had partial hysterectomy  . Gallstones   . Migraine    hx of    Current Outpatient Prescriptions on File Prior to Visit  Medication Sig Dispense Refill  . Blood Glucose Monitoring Suppl (CONTOUR USB BLOOD GLUCOSE SYS) w/Device KIT Use daily as directed. 1 kit 0  . Cinnamon 500 MG capsule Take 1,000 mg by mouth 2 (two) times daily.    . cyclobenzaprine (FLEXERIL) 10 MG tablet Take 1 tablet (10 mg total) by mouth at bedtime. 30 tablet 1  . Echinacea-Goldenseal (ECHINACEA COMB/GOLDEN SEAL PO) Take by mouth.    . fluticasone (FLONASE) 50 MCG/ACT nasal spray Place 2 sprays into both nostrils daily. 16 g 2  . furosemide (LASIX) 20 MG tablet Take 1 tablet (20 mg total) by mouth daily. 10 tablet 3  . gabapentin (NEURONTIN) 100 MG capsule Take 1 capsule (100 mg total) by mouth 2 (two) times daily. 180 capsule 1  . glimepiride (AMARYL) 4 MG tablet Take 1 tablet (4 mg total) by mouth daily before breakfast. 30 tablet 3  . GLUCOSAMINE PO Take 1 tablet by mouth 2 (two) times daily.    Marland Kitchen glucose blood (BAYER  CONTOUR TEST) test strip Use twice daily as instructed to check sugars. Dx E. 11.9 100 each 12  . ibuprofen (ADVIL,MOTRIN) 200 MG tablet Take 200 mg by mouth every 6 (six) hours as needed. Reported on 02/13/2016    . loratadine (CLARITIN) 10 MG tablet Take 1 tablet (10 mg total) by mouth daily. 30 tablet 2  . losartan-hydrochlorothiazide (HYZAAR) 50-12.5 MG tablet Take 1 tablet by mouth daily. 30 tablet 3  . metFORMIN (GLUCOPHAGE) 500 MG tablet Take 1 tablet (500 mg total) by mouth 2 (two) times daily with a meal. 180 tablet 3  . Misc Natural Products (HERBAL ENERGY COMPLEX PO) Take by mouth 2 (two) times daily with a meal.    . Multiple Vitamins-Minerals (WOMENS MULTIVITAMIN PO) Take by mouth daily.    . Omega-3 1000 MG CAPS Take 1 g by mouth daily.    [name] DELICA LANCETS FINE MISC Use as directed.  Dx:E11.9 100 each 12  . oxyCODONE-acetaminophen (PERCOCET) 10-325 MG tablet Take 1 tablet by mouth every 8 (eight) hours as needed for pain. 90 tablet 0  . saxagliptin HCl (ONGLYZA) 5 MG TABS tablet Take 1 tablet (5 mg total) by mouth daily. 30 tablet 11  . vitamin B-12 (CYANOCOBALAMIN) 100 MCG tablet Take 100 mcg by mouth daily.    . vitamin C (ASCORBIC ACID) 500 MG tablet Take 1,000 mg by mouth daily.    Marland Kitchen  vitamin E 400 UNIT capsule Take 400 Units by mouth daily. Reported on 02/13/2016     No current facility-administered medications on file prior to visit.     Allergies  Allergen Reactions  . Penicillins Hives    Hives   . Tramadol Itching    Family History  Problem Relation Age of Onset  . Diabetes Father 70    Deceased  . Diabetes Mother     Living  . Hyperlipidemia Mother   . Tuberculosis Mother   . Asthma Mother   . Heart disease Maternal Uncle   . Colon cancer Maternal Uncle     dx in 60's  . Diabetes Brother   . Heart disease Maternal Aunt   . Arthritis Other     Maternal Aunts & Uncles  . Diabetes Sister   . Kidney failure Brother 30    Diseased  . Healthy Son      x1  . Esophageal cancer Neg Hx   . Rectal cancer Neg Hx   . Stomach cancer Neg Hx     Social History   Social History  . Marital status: Single    Spouse name: N/A  . Number of children: N/A  . Years of education: N/A   Social History Main Topics  . Smoking status: Former Smoker    Packs/day: 0.25    Years: 20.00    Types: Cigarettes    Quit date: 12/09/2009  . Smokeless tobacco: Never Used  . Alcohol use 0.0 oz/week     Comment: social  . Drug use: No  . Sexual activity: Not Currently   Other Topics Concern  . None   Social History Narrative  . None   Review of Systems - See HPI.  All other ROS are negative.  BP 118/78   Pulse 98   Temp 98.1 F (36.7 C) (Oral)   Resp 14   Ht _0  (1.676 m)   Wt 210 lb (95.3 kg)   SpO2 96%   BMI 33.89 kg/m   Physical Exam  Constitutional: She is oriented to person, place, and time and well-developed, well-nourished, and in no distress.  HENT:  Head: Normocephalic and atraumatic.  Eyes: Conjunctivae are normal.  Neck: Neck supple.  Cardiovascular: Normal rate, regular rhythm, normal heart sounds and intact distal pulses.   Pulmonary/Chest: Effort normal and breath sounds normal. No respiratory distress. She has no wheezes. She has no rales. She exhibits no tenderness.  Genitourinary: Vulva normal. Thick  white and vaginal discharge found.  Genitourinary Comments: Patient s/p hysterectomy  Neurological: She is alert and oriented to person, place, and time.  Skin: Skin is warm and dry. No rash noted.  Psychiatric: Affect normal.  Vitals reviewed.   Recent Results (from the past 2160 hour(s))  Basic Metabolic Panel (BMET)     Status: Abnormal   Collection Time: 09/14/16  4:02 PM  Result Value Ref Range   Sodium 136 135 - 146 mmol/L   Potassium 4.0 3.5 - 5.3 mmol/L   Chloride 101 98 - 110 mmol/L   CO2 26 20 - 31 mmol/L   Glucose, Bld 221 (H) 65 - 99 mg/dL   BUN 14 7 - 25 mg/dL   Creat 0.79 0.50 - 1.05 mg/dL     Comment:   For patients > or = 57 years of age: The upper reference limit for Creatinine is approximately 13% higher for people identified as African-American.      Calcium 10.0 8.6 - 10.4  mg/dL  TSH     Status: None   Collection Time: 09/14/16  4:02 PM  Result Value Ref Range   TSH 0.89 mIU/L    Comment:   Reference Range   > or = 20 Years  0.40-4.50   Pregnancy Range First trimester  0.26-2.66 Second trimester 0.55-2.73 Third trimester  0.43-2.91     POCT glycosylated hemoglobin (Hb A1C)     Status: None   Collection Time: 09/25/16  3:12 PM  Result Value Ref Range   Hemoglobin A1C 9.0   Fructosamine     Status: Abnormal   Collection Time: 10/23/16  3:01 PM  Result Value Ref Range   Fructosamine 424 (H) 190 - 270 umol/L  POCT Glucose (CBG)     Status: Abnormal   Collection Time: 10/31/16  1:18 PM  Result Value Ref Range   POC Glucose 363 (A) 70 - 99 mg/dl  POCT Urinalysis Dipstick     Status: Abnormal   Collection Time: 10/31/16  1:23 PM  Result Value Ref Range   Color, UA yellow    Clarity, UA cloudy    Glucose, UA >2,000    Bilirubin, UA negative    Ketones, UA negative    Spec Grav, UA 1.020    Blood, UA negative    pH, UA 5.0    Protein, UA 15    Urobilinogen, UA 0.2    Nitrite, UA negative    Leukocytes, UA Negative Negative   Assessment/Plan: 1. Hyperglycemia In patient with DM II uncontrolled. Followed by Endocrinology. Non-fasting sugar at 360 today. She is to take extra 1/2 Amaryl. Continue Tyler Aas that she restarted a couple of days ago but increase by 2 units. She is to limit carbs and increase hydration. She is to call Endocrinology with sugar levels for further input for ongoing titration of Tresiba. No alarm signs/symptoms but uncontrolled sugar is most likely cause of recurrent vaginitis. ER if fasting sugar > 350 or non-fasting > 400 or if any symptoms of hyperglycemia are present.   - POCT Glucose (CBG) - POCT Urinalysis Dipstick  2. Vaginal  pruritus Thick, white discharge on exam. Giving hyperglycemia and abstinence, yeast most likely. Will check CBC today. Wet prep sent. Will start treatment empirically with Diflucan. Supportive measures reviewed. - CBC - Cervicovaginal ancillary only   Leeanne Rio, PA-C

## 2016-10-31 NOTE — Telephone Encounter (Signed)
Pt asking that Rx for today's visit be sent to [facility] on [address] in [place]. Pt states that she had to return to work and this pharmacy is closer.

## 2016-10-31 NOTE — Patient Instructions (Addendum)
Please take Diflucan as directed. Repeat in 3 days. Avoid any scented or dyed soaps to the area. These infections will continue to occur until sugar is under control again.   Please continue oral diabetes medications. Take an extra 1/2 Amaryl.  Increase the Tresiba by 2 units tonight. Continue for 3 days. If fasting sugar still above 200, increase by 2 more units.  Call Dr. Loanne Drilling to inform him of sugar readings.  For the shoulder, I am setting you up with Sports Medicine for a musculoskeletal [place] to further assess your symptoms. Avoid heavy lifting.   I will call you with all of your results. If sugars get above 350 with new dose of insulin, please go to the ER for assessment.

## 2016-10-31 NOTE — Telephone Encounter (Signed)
Advised patient sent the Fluconazole to the Pharmacy in [place] which is closer to her job.

## 2016-10-31 NOTE — Progress Notes (Signed)
Pre visit review using our clinic review tool, if applicable. No additional management support is needed unless otherwise documented below in the visit note. 

## 2016-11-04 NOTE — Progress Notes (Signed)
Subjective:    Patient ID: [name], female    DOB: [birth year], 55 y.o.   MRN: [id number]  HPI Pt returns for f/u of diabetes mellitus: DM type: 2 Dx'ed: 2017, when she had a cbg of 400 on steroids.  Complications: none Therapy: insulin and 3 oral meds.  GDM: never.  DKA: never. Severe hypoglycemia: never.   Pancreatitis: never.   Other: she took insulin for a brief time in early 2017.  Interval history: no cbg record, but states cbg's have been as high as 500.  pt states he feels well in general, except for vaginal itching.  Last week, she resumed the tresiba, at 20 mg qd.  Since then, cbg's have improved to apprpox 200.   Past Medical History:  Diagnosis Date  . Anemia   . Arthritis    bilateral knees  . Bronchitis    hx of  . Fibroid tumor    Had partial hysterectomy  . Gallstones   . Migraine    hx of    Past Surgical History:  Procedure Laterality Date  . ABDOMINAL HYSTERECTOMY     partial   . boil removed     . CARPAL TUNNEL RELEASE  04-15-15  . CHOLECYSTECTOMY  Oct 2012  . ELBOW SURGERY  04-15-15  . HAND SURGERY  04-15-15  . Nerve damage  04-15-15  . TOTAL KNEE ARTHROPLASTY  08/01/2012   Procedure: TOTAL KNEE ARTHROPLASTY;  Surgeon: Augustin Schooling, MD;  Location: [facility];  Service: Orthopedics;  Laterality: Right;  RIGHT TOTAL KNEE ARTHROPLASTY  . TRIGGER FINGER RELEASE  04-15-15  . TUBAL LIGATION    . WISDOM TOOTH EXTRACTION      Social History   Social History  . Marital status: Single    Spouse name: N/A  . Number of children: N/A  . Years of education: N/A   Occupational History  . Not on file.   Social History Main Topics  . Smoking status: Former Smoker    Packs/day: 0.25    Years: 20.00    Types: Cigarettes    Quit date: 12/09/2009  . Smokeless tobacco: Never Used  . Alcohol use 0.0 oz/week     Comment: social  . Drug use: No  . Sexual activity: Not Currently   Other Topics Concern  . Not on file   Social History Narrative  . No  narrative on file    Current Outpatient Prescriptions on File Prior to Visit  Medication Sig Dispense Refill  . Blood Glucose Monitoring Suppl (CONTOUR USB BLOOD GLUCOSE SYS) w/Device KIT Use daily as directed. 1 kit 0  . Cinnamon 500 MG capsule Take 1,000 mg by mouth 2 (two) times daily.    . cyclobenzaprine (FLEXERIL) 10 MG tablet Take 1 tablet (10 mg total) by mouth at bedtime. 30 tablet 1  . Echinacea-Goldenseal (ECHINACEA COMB/GOLDEN SEAL PO) Take by mouth.    . fluticasone (FLONASE) 50 MCG/ACT nasal spray Place 2 sprays into both nostrils daily. 16 g 2  . furosemide (LASIX) 20 MG tablet Take 1 tablet (20 mg total) by mouth daily. 10 tablet 3  . gabapentin (NEURONTIN) 100 MG capsule Take 1 capsule (100 mg total) by mouth 2 (two) times daily. 180 capsule 1  . GLUCOSAMINE PO Take 1 tablet by mouth 2 (two) times daily.    Marland Kitchen glucose blood (BAYER CONTOUR TEST) test strip Use twice daily as instructed to check sugars. Dx E. 11.9 100 each 12  . ibuprofen (ADVIL,MOTRIN)  200 MG tablet Take 200 mg by mouth every 6 (six) hours as needed. Reported on 02/13/2016    . loratadine (CLARITIN) 10 MG tablet Take 1 tablet (10 mg total) by mouth daily. 30 tablet 2  . losartan-hydrochlorothiazide (HYZAAR) 50-12.5 MG tablet Take 1 tablet by mouth daily. 30 tablet 3  . meloxicam (MOBIC) 7.5 MG tablet Take 7.5 mg by mouth every 12 (twelve) hours.  2  . metFORMIN (GLUCOPHAGE) 500 MG tablet Take 1 tablet (500 mg total) by mouth 2 (two) times daily with a meal. 180 tablet 3  . Misc Natural Products (HERBAL ENERGY COMPLEX PO) Take by mouth 2 (two) times daily with a meal.    . Multiple Vitamins-Minerals (WOMENS MULTIVITAMIN PO) Take by mouth daily.    . Omega-3 1000 MG CAPS Take 1 g by mouth daily.    [name] DELICA LANCETS FINE MISC Use as directed.  Dx:E11.9 100 each 12  . oxyCODONE-acetaminophen (PERCOCET) 10-325 MG tablet Take 1 tablet by mouth every 8 (eight) hours as needed for pain. 90 tablet 0  .  saxagliptin HCl (ONGLYZA) 5 MG TABS tablet Take 1 tablet (5 mg total) by mouth daily. 30 tablet 11  . vitamin B-12 (CYANOCOBALAMIN) 100 MCG tablet Take 100 mcg by mouth daily.    . vitamin C (ASCORBIC ACID) 500 MG tablet Take 1,000 mg by mouth daily.    . vitamin E 400 UNIT capsule Take 400 Units by mouth daily. Reported on 02/13/2016     No current facility-administered medications on file prior to visit.     Allergies  Allergen Reactions  . Penicillins Hives    Hives   . Tramadol Itching    Family History  Problem Relation Age of Onset  . Diabetes Father 57    Deceased  . Diabetes Mother     Living  . Hyperlipidemia Mother   . Tuberculosis Mother   . Asthma Mother   . Heart disease Maternal Uncle   . Colon cancer Maternal Uncle     dx in 28's  . Diabetes Brother   . Heart disease Maternal Aunt   . Arthritis Other     Maternal Aunts & Uncles  . Diabetes Sister   . Kidney failure Brother 30    Diseased  . Healthy Son     x1  . Esophageal cancer Neg Hx   . Rectal cancer Neg Hx   . Stomach cancer Neg Hx     BP 122/70   Pulse 95   Ht '5\' 6"'  (1.676 m)   Wt 215 lb (97.5 kg)   SpO2 96%   BMI 34.70 kg/m    Review of Systems She denies hypoglycemia.      Objective:   Physical Exam VITAL SIGNS:  See vs page GENERAL: no distress Pulses: dorsalis pedis intact bilat.   MSK: no deformity of the feet CV: no leg edema Skin:  no ulcer on the feet, but the skin is dry.  normal color and temp on the feet. Neuro: sensation is intact to touch on the feet.    Lab Results  Component Value Date   HGBA1C 9.0 09/25/2016      Assessment & Plan:  Insulin-requiring type 2 DM: back on insulin.  Patient is advised the following: Patient Instructions  check your blood sugar once a day.  vary the time of day when you check, between before the 3 meals, and at bedtime.  also check if you have symptoms of your blood  sugar being too high or too low.  please keep a record of the  readings and bring it to your next appointment here (or you can bring the meter itself).  You can write it on any piece of paper.  please call us sooner if your blood sugar goes below 70, or if you have a lot of readings over 200.  Please increase the tresiba to 40 units daily, and: Please continue the same metformin and onglyza, and: Decrease the glimepiride to 1 pill daily.  As this is redundant with the insulin, we can phase this out with time. Please call us next week, to tell us high the blood sugar is doing Please come back for a follow-up appointment in 6 weeks.

## 2016-11-05 ENCOUNTER — Encounter: Payer: Self-pay | Admitting: Endocrinology

## 2016-11-05 ENCOUNTER — Ambulatory Visit (INDEPENDENT_AMBULATORY_CARE_PROVIDER_SITE_OTHER): Payer: BLUE CROSS/BLUE SHIELD | Admitting: Endocrinology

## 2016-11-05 VITALS — BP 122/70 | HR 95 | Ht 66.0 in | Wt 215.0 lb

## 2016-11-05 DIAGNOSIS — E119 Type 2 diabetes mellitus without complications: Secondary | ICD-10-CM

## 2016-11-05 LAB — CERVICOVAGINAL ANCILLARY ONLY: WET PREP (BD AFFIRM): POSITIVE — AB

## 2016-11-05 MED ORDER — INSULIN DEGLUDEC 100 UNIT/ML ~~LOC~~ SOPN: 40.0000 [IU] | PEN_INJECTOR | Freq: Every day | SUBCUTANEOUS | 11 refills | Status: DC

## 2016-11-05 MED ORDER — GLIMEPIRIDE 4 MG PO TABS: 4.0000 mg | ORAL_TABLET | Freq: Every day | ORAL | 3 refills | Status: DC

## 2016-11-05 NOTE — Patient Instructions (Addendum)
check your blood sugar once a day.  vary the time of day when you check, between before the 3 meals, and at bedtime.  also check if you have symptoms of your blood sugar being too high or too low.  please keep a record of the readings and bring it to your next appointment here (or you can bring the meter itself).  You can write it on any piece of paper.  please call us sooner if your blood sugar goes below 70, or if you have a lot of readings over 200.  Please increase the tresiba to 40 units daily, and: Please continue the same metformin and onglyza, and: Decrease the glimepiride to 1 pill daily.  As this is redundant with the insulin, we can phase this out with time. Please call us next week, to tell us high the blood sugar is doing Please come back for a follow-up appointment in 6 weeks.

## 2016-11-06 ENCOUNTER — Other Ambulatory Visit: Payer: Self-pay | Admitting: Physician Assistant

## 2016-11-06 DIAGNOSIS — M25511 Pain in right shoulder: Secondary | ICD-10-CM

## 2016-11-07 LAB — URINE CYTOLOGY ANCILLARY ONLY
Bacterial vaginitis: NEGATIVE
Candida vaginitis: POSITIVE — AB

## 2016-11-08 ENCOUNTER — Ambulatory Visit (HOSPITAL_BASED_OUTPATIENT_CLINIC_OR_DEPARTMENT_OTHER)
Admission: RE | Admit: 2016-11-08 | Discharge: 2016-11-08 | Disposition: A | Discharge status: Home / Self Care | Payer: BLUE CROSS/BLUE SHIELD | Source: Ambulatory Visit | Attending: Family Medicine | Admitting: Family Medicine

## 2016-11-08 ENCOUNTER — Ambulatory Visit (INDEPENDENT_AMBULATORY_CARE_PROVIDER_SITE_OTHER): Payer: BLUE CROSS/BLUE SHIELD | Admitting: Family Medicine

## 2016-11-08 ENCOUNTER — Encounter: Payer: Self-pay | Admitting: Family Medicine

## 2016-11-08 ENCOUNTER — Telehealth: Payer: Self-pay | Admitting: Physician Assistant

## 2016-11-08 VITALS — BP 108/77 | HR 84 | Ht 66.0 in | Wt 210.0 lb

## 2016-11-08 DIAGNOSIS — M25511 Pain in right shoulder: Secondary | ICD-10-CM

## 2016-11-08 NOTE — Assessment & Plan Note (Signed)
unusual presentation.  1 week of lack of motion and strength of supraspinatus and infraspinatus but without a known injury.  This along with ultrasound findings concerning for full thickness tear of supraspinatus, probable infraspinatus tear also.  Will go ahead with MRI to confirm findings.  Independently reviewed radiographs today negative for fracture or other abnormalities.

## 2016-11-08 NOTE — Progress Notes (Addendum)
PCP and consultation requested by: Leeanne Rio, PA-C  Subjective:   HPI: Patient is a 55 y.o. female here for right shoulder pain.  Patient reports a week ago she woke up with right shoulder problems. Noticed when she tried reaching forward she could not lift arm past a certain point. Some pain but most of her problem is this limitation in motion. Pain currently 0/10 - feels more of a discomfort lateral shoulder/upper arm. Better with heat. No night pain. 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 No skin changes, no new numbness.  Past Medical History:  Diagnosis Date  . Anemia   . Arthritis    bilateral knees  . Bronchitis    hx of  . Fibroid tumor    Had partial hysterectomy  . Gallstones   . Migraine    hx of    Current Outpatient Prescriptions on File Prior to Visit  Medication Sig Dispense Refill  . Blood Glucose Monitoring Suppl (CONTOUR USB BLOOD GLUCOSE SYS) w/Device KIT Use daily as directed. 1 kit 0  . Cinnamon 500 MG capsule Take 1,000 mg by mouth 2 (two) times daily.    . cyclobenzaprine (FLEXERIL) 10 MG tablet Take 1 tablet (10 mg total) by mouth at bedtime. 30 tablet 1  . Echinacea-Goldenseal (ECHINACEA COMB/GOLDEN SEAL PO) Take by mouth.    . fluticasone (FLONASE) 50 MCG/ACT nasal spray Place 2 sprays into both nostrils daily. 16 g 2  . furosemide (LASIX) 20 MG tablet Take 1 tablet (20 mg total) by mouth daily. 10 tablet 3  . gabapentin (NEURONTIN) 100 MG capsule Take 1 capsule (100 mg total) by mouth 2 (two) times daily. 180 capsule 1  . glimepiride (AMARYL) 4 MG tablet Take 1 tablet (4 mg total) by mouth daily before breakfast. 30 tablet 3  . GLUCOSAMINE PO Take 1 tablet by mouth 2 (two) times daily.    Marland Kitchen glucose blood (BAYER CONTOUR TEST) test strip Use twice daily as instructed to check sugars. Dx E. 11.9 100 each 12  . ibuprofen (ADVIL,MOTRIN) 200 MG tablet Take 200 mg by mouth every 6 (six)  hours as needed. Reported on 02/13/2016    . loratadine (CLARITIN) 10 MG tablet Take 1 tablet (10 mg total) by mouth daily. 30 tablet 2  . losartan-hydrochlorothiazide (HYZAAR) 50-12.5 MG tablet Take 1 tablet by mouth daily. 30 tablet 3  . meloxicam (MOBIC) 7.5 MG tablet Take 7.5 mg by mouth every 12 (twelve) hours.  2  . metFORMIN (GLUCOPHAGE) 500 MG tablet Take 1 tablet (500 mg total) by mouth 2 (two) times daily with a meal. 180 tablet 3  . Misc Natural Products (HERBAL ENERGY COMPLEX PO) Take by mouth 2 (two) times daily with a meal.    . Multiple Vitamins-Minerals (WOMENS MULTIVITAMIN PO) Take by mouth daily.    . Omega-3 1000 MG CAPS Take 1 g by mouth daily.    [name] DELICA LANCETS FINE MISC Use as directed.  Dx:E11.9 100 each 12  . oxyCODONE-acetaminophen (PERCOCET) 10-325 MG tablet Take 1 tablet by mouth every 8 (eight) hours as needed for pain. 90 tablet 0  . saxagliptin HCl (ONGLYZA) 5 MG TABS tablet Take 1 tablet (5 mg total) by mouth daily. 30 tablet 11  . vitamin B-12 (CYANOCOBALAMIN) 100 MCG tablet Take 100 mcg by mouth daily.    . vitamin C (ASCORBIC ACID) 500 MG tablet Take 1,000 mg by mouth daily.    Marland Kitchen  vitamin E 400 UNIT capsule Take 400 Units by mouth daily. Reported on 02/13/2016     No current facility-administered medications on file prior to visit.     Past Surgical History:  Procedure Laterality Date  . ABDOMINAL HYSTERECTOMY     partial   . boil removed     . CARPAL TUNNEL RELEASE  04-15-15  . CHOLECYSTECTOMY  Oct 2012  . ELBOW SURGERY  04-15-15  . HAND SURGERY  04-15-15  . Nerve damage  04-15-15  . TOTAL KNEE ARTHROPLASTY  08/01/2012   Procedure: TOTAL KNEE ARTHROPLASTY;  Surgeon: Augustin Schooling, MD;  Location: [facility];  Service: Orthopedics;  Laterality: Right;  RIGHT TOTAL KNEE ARTHROPLASTY  . TRIGGER FINGER RELEASE  04-15-15  . TUBAL LIGATION    . WISDOM TOOTH EXTRACTION      Allergies  Allergen Reactions  . Penicillins Hives    Hives   . Tramadol Itching     Social History   Social History  . Marital status: Single    Spouse name: N/A  . Number of children: N/A  . Years of education: N/A   Occupational History  . Not on file.   Social History Main Topics  . Smoking status: Former Smoker    Packs/day: 0.25    Years: 20.00    Types: Cigarettes    Quit date: 12/09/2009  . Smokeless tobacco: Never Used  . Alcohol use 0.0 oz/week     Comment: social  . Drug use: No  . Sexual activity: Not Currently   Other Topics Concern  . Not on file   Social History Narrative  . No narrative on file    Family History  Problem Relation Age of Onset  . Diabetes Father 13    Deceased  . Diabetes Mother     Living  . Hyperlipidemia Mother   . Tuberculosis Mother   . Asthma Mother   . Heart disease Maternal Uncle   . Colon cancer Maternal Uncle     dx in 47's  . Diabetes Brother   . Heart disease Maternal Aunt   . Arthritis Other     Maternal Aunts & Uncles  . Diabetes Sister   . Kidney failure Brother 30    Diseased  . Healthy Son     x1  . Esophageal cancer Neg Hx   . Rectal cancer Neg Hx   . Stomach cancer Neg Hx     BP 108/77   Pulse 84   Ht '5\' 6"'  (1.676 m)   Wt 210 lb (95.3 kg)   BMI 33.89 kg/m   Review of Systems: See HPI above.     Objective:  Physical Exam:  Gen: NAD, comfortable in exam room  Right shoulder: No swelling, ecchymoses.  No gross deformity. No TTP. ER only to 60 degrees actively.  Abduction only to 50 degrees, full flexion.  Full passive motion.   Negative Hawkins, Neers. Negative Yergasons. A little strength with drop arm test. Strength 2/5 with empty can resisted ER.  5/5 IR. Negative apprehension. NV intact distally.  Left shoulder: FROM without pain.   MSK u/s right shoulder: subscapularis, biceps tendon appear normal.  Infraspinatus appears hypoechoic and thin.  Unable to visualize supraspinatus concerning for retracted tear.  Assessment & Plan:  1. Right shoulder pain -  unusual presentation.  1 week of lack of motion and strength of supraspinatus and infraspinatus but without a known injury.  This along with ultrasound findings concerning for full  thickness tear of supraspinatus, probable infraspinatus tear also.  Will go ahead with MRI to confirm findings.  Independently reviewed radiographs today negative for fracture or other abnormalities.  Addendum:  MRI reviewed and discussed with patient.  She does have a full thickness supraspinatus tear that has retracted 2.5-3cm.  She also has atrophy of all rotator cuff muscles.  We discussed uncertainty as to whether this is reparable but I recommended she consult with ortho, shoulder specialist to discuss - will make the referral.

## 2016-11-08 NOTE — Telephone Encounter (Signed)
error:[id number] ° °

## 2016-11-08 NOTE — Patient Instructions (Signed)
I'm concerned you tore your rotator cuff based on your exam and preliminary ultrasound findings. We will go ahead with an MRI to further assess. I will call you with the results and next steps the business day following the MRI.

## 2016-11-15 ENCOUNTER — Ambulatory Visit (HOSPITAL_BASED_OUTPATIENT_CLINIC_OR_DEPARTMENT_OTHER)
Admission: RE | Admit: 2016-11-15 | Discharge: 2016-11-15 | Disposition: A | Discharge status: Home / Self Care | Payer: BLUE CROSS/BLUE SHIELD | Source: Ambulatory Visit | Attending: Family Medicine | Admitting: Family Medicine

## 2016-11-15 DIAGNOSIS — M75101 Unspecified rotator cuff tear or rupture of right shoulder, not specified as traumatic: Secondary | ICD-10-CM | POA: Insufficient documentation

## 2016-11-15 DIAGNOSIS — M25511 Pain in right shoulder: Secondary | ICD-10-CM | POA: Diagnosis not present

## 2016-11-15 DIAGNOSIS — M62511 Muscle wasting and atrophy, not elsewhere classified, right shoulder: Secondary | ICD-10-CM | POA: Insufficient documentation

## 2016-11-15 NOTE — Addendum Note (Signed)
Addended by: Sherrie George F on: 11/15/2016 10:01 AM   Modules accepted: Orders

## 2016-11-19 ENCOUNTER — Telehealth: Payer: Self-pay | Admitting: Family Medicine

## 2016-11-19 NOTE — Telephone Encounter (Signed)
Spoke with patient - see addendum to note.  Thanks!

## 2016-11-20 NOTE — Addendum Note (Signed)
Addended by: Sherrie George F on: 11/20/2016 02:24 PM   Modules accepted: Orders

## 2016-12-04 ENCOUNTER — Other Ambulatory Visit: Payer: Self-pay

## 2016-12-04 MED ORDER — GLIMEPIRIDE 4 MG PO TABS: 4.0000 mg | ORAL_TABLET | Freq: Every day | ORAL | 1 refills | Status: DC

## 2016-12-05 ENCOUNTER — Other Ambulatory Visit: Payer: Self-pay

## 2016-12-16 NOTE — Progress Notes (Deleted)
   Subjective:    Patient ID: [name], female    DOB: [birth year], 56 y.o.   MRN: [id number]  HPI Pt returns for f/u of diabetes mellitus: DM type: 2 Dx'ed: 2017, when she had a cbg of 400 on steroids.  Complications: none Therapy: insulin and 3 oral meds.  GDM: never.  DKA: never. Severe hypoglycemia: never.   Pancreatitis: never.   Other: she took insulin for a brief time in early 2017.  Interval history: no cbg record, but states cbg's have been as high as 500.  pt states he feels well in general, except for vaginal itching.  Last week, she resumed the tresiba, at 20 mg qd.  Since then, cbg's have improved to apprpox 200.     Review of Systems     Objective:   Physical Exam VITAL SIGNS:  See vs page GENERAL: no distress Pulses: dorsalis pedis intact bilat.   MSK: no deformity of the feet CV: no leg edema Skin:  no ulcer on the feet, but the skin is dry.  normal color and temp on the feet.  Neuro: sensation is intact to touch on the feet.        Assessment & Plan:

## 2016-12-17 ENCOUNTER — Ambulatory Visit (INDEPENDENT_AMBULATORY_CARE_PROVIDER_SITE_OTHER): Payer: BLUE CROSS/BLUE SHIELD | Admitting: Endocrinology

## 2016-12-17 DIAGNOSIS — Z0289 Encounter for other administrative examinations: Secondary | ICD-10-CM

## 2016-12-28 ENCOUNTER — Other Ambulatory Visit: Payer: Self-pay | Admitting: Medical

## 2016-12-29 ENCOUNTER — Other Ambulatory Visit: Payer: Self-pay | Admitting: Physician Assistant

## 2016-12-29 DIAGNOSIS — M5412 Radiculopathy, cervical region: Secondary | ICD-10-CM

## 2016-12-30 NOTE — Telephone Encounter (Signed)
Needs follow-up. Is due for physical with fasting labs to reassess cholesterol, etc and to continue chronic medications.   Please call her to schedule.

## 2016-12-31 NOTE — Telephone Encounter (Signed)
LMOVM advising patient she needs to schedule an CPE for labs and chronic medication monitoring

## 2017-01-25 ENCOUNTER — Other Ambulatory Visit: Payer: Self-pay | Admitting: Physician Assistant

## 2017-01-25 DIAGNOSIS — M5412 Radiculopathy, cervical region: Secondary | ICD-10-CM

## 2017-01-26 ENCOUNTER — Other Ambulatory Visit: Payer: Self-pay | Admitting: Endocrinology

## 2017-01-29 ENCOUNTER — Encounter: Payer: Self-pay | Admitting: Physician Assistant

## 2017-01-29 ENCOUNTER — Ambulatory Visit (INDEPENDENT_AMBULATORY_CARE_PROVIDER_SITE_OTHER): Payer: BLUE CROSS/BLUE SHIELD | Admitting: Physician Assistant

## 2017-01-29 VITALS — BP 112/78 | HR 90 | Temp 97.8°F | Resp 14 | Ht 66.0 in | Wt 216.0 lb

## 2017-01-29 DIAGNOSIS — Z0000 Encounter for general adult medical examination without abnormal findings: Secondary | ICD-10-CM | POA: Diagnosis not present

## 2017-01-29 DIAGNOSIS — Z1239 Encounter for other screening for malignant neoplasm of breast: Secondary | ICD-10-CM

## 2017-01-29 DIAGNOSIS — E119 Type 2 diabetes mellitus without complications: Secondary | ICD-10-CM | POA: Diagnosis not present

## 2017-01-29 DIAGNOSIS — Z1231 Encounter for screening mammogram for malignant neoplasm of breast: Secondary | ICD-10-CM

## 2017-01-29 DIAGNOSIS — I10 Essential (primary) hypertension: Secondary | ICD-10-CM

## 2017-01-29 LAB — URINALYSIS, ROUTINE W REFLEX MICROSCOPIC
Bilirubin Urine: NEGATIVE
HGB URINE DIPSTICK: NEGATIVE
KETONES UR: NEGATIVE
Leukocytes, UA: NEGATIVE
Nitrite: NEGATIVE
RBC / HPF: NONE SEEN (ref 0–?)
Specific Gravity, Urine: 1.015 (ref 1.000–1.030)
Total Protein, Urine: NEGATIVE
Urine Glucose: >=1000 — AB
Urobilinogen, UA: 0.2 (ref 0.0–1.0)
pH: 6 (ref 5.0–8.0)

## 2017-01-29 LAB — COMPREHENSIVE METABOLIC PANEL
ALK PHOS: 117 U/L (ref 39–117)
ALT: 23 U/L (ref 0–35)
AST: 16 U/L (ref 0–37)
Albumin: 4.4 g/dL (ref 3.5–5.2)
BUN: 20 mg/dL (ref 6–23)
CHLORIDE: 100 meq/L (ref 96–112)
CO2: 30 meq/L (ref 19–32)
Calcium: 10.3 mg/dL (ref 8.4–10.5)
Creatinine, Ser: 0.95 mg/dL (ref 0.40–1.20)
GFR: 77.89 mL/min (ref >60.00)
Glucose, Bld: 276 mg/dL — ABNORMAL HIGH (ref 70–99)
Potassium: 4.4 mEq/L (ref 3.5–5.1)
Sodium: 134 mEq/L — ABNORMAL LOW (ref 135–145)
Total Bilirubin: 0.4 mg/dL (ref 0.2–1.2)
Total Protein: 7.2 g/dL (ref 6.0–8.3)

## 2017-01-29 LAB — TSH: TSH: 0.93 u[IU]/mL (ref 0.35–4.50)

## 2017-01-29 LAB — CBC
HEMATOCRIT: 37.9 % (ref 36.0–46.0)
Hemoglobin: 12.3 g/dL (ref 12.0–15.0)
MCHC: 32.6 g/dL (ref 30.0–36.0)
MCV: 87.5 fl (ref 78.0–100.0)
PLATELETS: 289 10*3/uL (ref 150.0–400.0)
RBC: 4.33 Mil/uL (ref 3.87–5.11)
RDW: 13.6 % (ref 11.5–15.5)
WBC: 5 10*3/uL (ref 4.0–10.5)

## 2017-01-29 LAB — LIPID PANEL
CHOLESTEROL: 192 mg/dL (ref 0–200)
HDL: 66.4 mg/dL (ref >39.00)
LDL Cholesterol: 110 mg/dL — ABNORMAL HIGH (ref 0–99)
NonHDL: 125.93
Total CHOL/HDL Ratio: 3
Triglycerides: 81 mg/dL (ref 0.0–149.0)
VLDL: 16.2 mg/dL (ref 0.0–40.0)

## 2017-01-29 NOTE — Assessment & Plan Note (Signed)
Depression screen negative. Health Maintenance reviewed -- Declines flu, pneumonia an tetanus. Colonoscopy up-to-date. One-time HIV screen today. Mammogram ordered. Patient is s/p hysterectomy. Preventive schedule discussed and handout given in AVS. Will obtain fasting labs today.

## 2017-01-29 NOTE — Assessment & Plan Note (Signed)
Followed by Endocrinology. Continue care as directed by specialist. Declines flu and pneumonia vaccine. Eye exam and foot exam up-to-date.

## 2017-01-29 NOTE — Patient Instructions (Signed)
Please go to the lab for blood work.   Our office will call you with your results unless you have chosen to receive results via MyChart.  If your blood work is normal we will follow-up each year for physicals and as scheduled for chronic medical problems.  If anything is abnormal we will treat accordingly and get you in for a follow-up.  Please follow-up with your specialists.  You will be contacted for mammogram. You have declined breast examination, tetanus vaccine and pneumonia vaccination today. Let us know if you reconsider.   Preventive Care 40-64 Years, Female Preventive care refers to lifestyle choices and visits with your health care provider that can promote health and wellness. What does preventive care include?  A yearly physical exam. This is also called an annual well check.  Dental exams once or twice a year.  Routine eye exams. Ask your health care provider how often you should have your eyes checked.  Personal lifestyle choices, including:  Daily care of your teeth and gums.  Regular physical activity.  Eating a healthy diet.  Avoiding tobacco and drug use.  Limiting alcohol use.  Practicing safe sex.  Taking low-dose aspirin daily starting at age 42.  Taking vitamin and mineral supplements as recommended by your health care provider. What happens during an annual well check? The services and screenings done by your health care provider during your annual well check will depend on your age, overall health, lifestyle risk factors, and family history of disease. Counseling  Your health care provider may ask you questions about your:  Alcohol use.  Tobacco use.  Drug use.  Emotional well-being.  Home and relationship well-being.  Sexual activity.  Eating habits.  Work and work Statistician.  Method of birth control.  Menstrual cycle.  Pregnancy history. Screening  You may have the following tests or measurements:  Height, weight, and  BMI.  Blood pressure.  Lipid and cholesterol levels. These may be checked every 5 years, or more frequently if you are over 50 years old.  Skin check.  Lung cancer screening. You may have this screening every year starting at age 73 if you have a 30-pack-year history of smoking and currently smoke or have quit within the past 15 years.  Fecal occult blood test (FOBT) of the stool. You may have this test every year starting at age 64.  Flexible sigmoidoscopy or colonoscopy. You may have a sigmoidoscopy every 5 years or a colonoscopy every 10 years starting at age 48.  Hepatitis C blood test.  Hepatitis B blood test.  Sexually transmitted disease (STD) testing.  Diabetes screening. This is done by checking your blood sugar (glucose) after you have not eaten for a while (fasting). You may have this done every 1-3 years.  Mammogram. This may be done every 1-2 years. Talk to your health care provider about when you should start having regular mammograms. This may depend on whether you have a family history of breast cancer.  BRCA-related cancer screening. This may be done if you have a family history of breast, ovarian, tubal, or peritoneal cancers.  Pelvic exam and Pap test. This may be done every 3 years starting at age 59. Starting at age 57, this may be done every 5 years if you have a Pap test in combination with an HPV test.  Bone density scan. This is done to screen for osteoporosis. You may have this scan if you are at high risk for osteoporosis. Discuss your test results,  treatment options, and if necessary, the need for more tests with your health care provider. Vaccines  Your health care provider may recommend certain vaccines, such as:  Influenza vaccine. This is recommended every year.  Tetanus, diphtheria, and acellular pertussis (Tdap, Td) vaccine. You may need a Td booster every 10 years.  Varicella vaccine. You may need this if you have not been vaccinated.  Zoster  vaccine. You may need this after age 34.  Measles, mumps, and rubella (MMR) vaccine. You may need at least one dose of MMR if you were born in 1957 or later. You may also need a second dose.  Pneumococcal 13-valent conjugate (PCV13) vaccine. You may need this if you have certain conditions and were not previously vaccinated.  Pneumococcal polysaccharide (PPSV23) vaccine. You may need one or two doses if you smoke cigarettes or if you have certain conditions.  Meningococcal vaccine. You may need this if you have certain conditions.  Hepatitis A vaccine. You may need this if you have certain conditions or if you travel or work in places where you may be exposed to hepatitis A.  Hepatitis B vaccine. You may need this if you have certain conditions or if you travel or work in places where you may be exposed to hepatitis B.  Haemophilus influenzae type b (Hib) vaccine. You may need this if you have certain conditions. Talk to your health care provider about which screenings and vaccines you need and how often you need them. This information is not intended to replace advice given to you by your health care provider. Make sure you discuss any questions you have with your health care provider. Document Released: 12/23/2015 Document Revised: 08/15/2016 Document Reviewed: 09/27/2015 Elsevier Interactive Patient Education  2017 [employer]. .

## 2017-01-29 NOTE — Assessment & Plan Note (Signed)
Order for screening mammogram placed.  

## 2017-01-29 NOTE — Progress Notes (Signed)
Pre visit review using our clinic review tool, if applicable. No additional management support is needed unless otherwise documented below in the visit note. 

## 2017-01-29 NOTE — Progress Notes (Signed)
Patient presents to clinic today for annual exam.  Patient is fasting for labs.   Chronic Issues: Hypertension -- Is taking medication as directed. Patient denies chest pain, palpitations, lightheadedness, dizziness, vision changes or frequent headaches.  BP Readings from Last 3 Encounters:  01/29/17 112/78  11/08/16 108/77  11/05/16 122/70   Diabetes Mellitus II -- Followed by Endocrinology. Currently on multi-drug regimen including Metformin, Onglyza and Tresiba.  Foot exam, Eye exam up-to-date. Declines pneumonia vaccine. Fasting sugars -- averaging 140s   Health Maintenance: Immunizations -- Declines flu shot. Declines tetanus. Colonoscopy -- Up-to-date Mammogram -- Last 01/2016. Due for repeat. Will place order.  PAP -- s/p hysterectomy  Pneumonia Vaccine -- Declines flu shot.  HIV -- Agrees to today. Has not been sexually active in quite some time.   Past Medical History:  Diagnosis Date  . Anemia   . Arthritis    bilateral knees  . Bronchitis    hx of  . Fibroid tumor    Had partial hysterectomy  . Gallstones   . Migraine    hx of    Past Surgical History:  Procedure Laterality Date  . ABDOMINAL HYSTERECTOMY     partial   . boil removed     . CARPAL TUNNEL RELEASE  04-15-15  . CHOLECYSTECTOMY  Oct 2012  . ELBOW SURGERY  04-15-15  . HAND SURGERY  04-15-15  . Nerve damage  04-15-15  . TOTAL KNEE ARTHROPLASTY  08/01/2012   Procedure: TOTAL KNEE ARTHROPLASTY;  Surgeon: Augustin Schooling, MD;  Location: [facility];  Service: Orthopedics;  Laterality: Right;  RIGHT TOTAL KNEE ARTHROPLASTY  . TRIGGER FINGER RELEASE  04-15-15  . TUBAL LIGATION    . WISDOM TOOTH EXTRACTION      Current Outpatient Prescriptions on File Prior to Visit  Medication Sig Dispense Refill  . BD PEN NEEDLE NANO U/F 32G X 4 MM MISC USE DAILY TO ADMINISTER INSULIN AS DIRECTED  2  . Blood Glucose Monitoring Suppl (CONTOUR USB BLOOD GLUCOSE SYS) w/Device KIT Use daily as directed. 1 kit 0  . Cinnamon 500  MG capsule Take 1,000 mg by mouth 2 (two) times daily.    . cyclobenzaprine (FLEXERIL) 10 MG tablet Take 1 tablet (10 mg total) by mouth at bedtime. 30 tablet 1  . Echinacea-Goldenseal (ECHINACEA COMB/GOLDEN SEAL PO) Take by mouth.    . fluticasone (FLONASE) 50 MCG/ACT nasal spray Place 2 sprays into both nostrils daily. 16 g 2  . gabapentin (NEURONTIN) 100 MG capsule TAKE 1 CAPSULE (100 MG TOTAL) BY MOUTH 2 (TWO) TIMES DAILY. 180 capsule 1  . glimepiride (AMARYL) 4 MG tablet Take 1 tablet (4 mg total) by mouth daily before breakfast. 90 tablet 1  . GLUCOSAMINE PO Take 1 tablet by mouth 2 (two) times daily.    Marland Kitchen glucose blood (BAYER CONTOUR TEST) test strip Use twice daily as instructed to check sugars. Dx E. 11.9 100 each 12  . ibuprofen (ADVIL,MOTRIN) 200 MG tablet Take 200 mg by mouth every 6 (six) hours as needed. Reported on 02/13/2016    . loratadine (CLARITIN) 10 MG tablet TAKE 1 TABLET BY MOUTH EVERY DAY 30 tablet 2  . losartan-hydrochlorothiazide (HYZAAR) 50-12.5 MG tablet Take 1 tablet by mouth daily. 30 tablet 3  . metFORMIN (GLUCOPHAGE) 500 MG tablet Take 1 tablet (500 mg total) by mouth 2 (two) times daily with a meal. 180 tablet 3  . Misc Natural Products (HERBAL ENERGY COMPLEX PO) Take by mouth 2 (  two) times daily with a meal.    . Multiple Vitamins-Minerals (WOMENS MULTIVITAMIN PO) Take by mouth daily.    . Omega-3 1000 MG CAPS Take 1 g by mouth daily.    [name] DELICA LANCETS FINE MISC Use as directed.  Dx:E11.9 100 each 12  . oxyCODONE-acetaminophen (PERCOCET) 10-325 MG tablet Take 1 tablet by mouth every 8 (eight) hours as needed for pain. 90 tablet 0  . saxagliptin HCl (ONGLYZA) 5 MG TABS tablet Take 1 tablet (5 mg total) by mouth daily. 30 tablet 11  . TRESIBA FLEXTOUCH 200 UNIT/ML SOPN INJECT 20 UNITS INTO THE SKIN DAILY.  2  . vitamin B-12 (CYANOCOBALAMIN) 100 MCG tablet Take 100 mcg by mouth daily.    . vitamin C (ASCORBIC ACID) 500 MG tablet Take 1,000 mg by mouth  daily.    . vitamin E 400 UNIT capsule Take 400 Units by mouth daily. Reported on 02/13/2016     No current facility-administered medications on file prior to visit.     Allergies  Allergen Reactions  . Penicillins Hives    Hives   . Tramadol Itching    Family History  Problem Relation Age of Onset  . Diabetes Father 12    Deceased  . Diabetes Mother     Living  . Hyperlipidemia Mother   . Tuberculosis Mother   . Asthma Mother   . Heart disease Maternal Uncle   . Colon cancer Maternal Uncle     dx in 62's  . Diabetes Brother   . Heart disease Maternal Aunt   . Arthritis Other     Maternal Aunts & Uncles  . Diabetes Sister   . Kidney failure Brother 30    Diseased  . Healthy Son     x1  . Esophageal cancer Neg Hx   . Rectal cancer Neg Hx   . Stomach cancer Neg Hx     Social History   Social History  . Marital status: Single    Spouse name: N/A  . Number of children: N/A  . Years of education: N/A   Occupational History  . Not on file.   Social History Main Topics  . Smoking status: Former Smoker    Packs/day: 0.25    Years: 20.00    Types: Cigarettes    Quit date: 12/09/2009  . Smokeless tobacco: Never Used  . Alcohol use 0.0 oz/week     Comment: social  . Drug use: No  . Sexual activity: Not Currently   Other Topics Concern  . Not on file   Social History Narrative  . No narrative on file   Review of Systems  Constitutional: Negative for fever and weight loss.  HENT: Negative for ear discharge, ear pain, hearing loss and tinnitus.   Eyes: Negative for blurred vision, double vision, photophobia and pain.  Respiratory: Negative for cough and shortness of breath.   Cardiovascular: Negative for chest pain and palpitations.  Gastrointestinal: Negative for abdominal pain, blood in stool, constipation, diarrhea, heartburn, melena, nausea and vomiting.  Genitourinary: Negative for dysuria, flank pain, frequency, hematuria and urgency.    Musculoskeletal: Negative for falls.  Neurological: Negative for dizziness, loss of consciousness and headaches.  Endo/Heme/Allergies: Negative for environmental allergies.  Psychiatric/Behavioral: Negative for depression, hallucinations, substance abuse and suicidal ideas. The patient is not nervous/anxious and does not have insomnia.    BP 112/78   Pulse 90   Temp 97.8 F (36.6 C) (Oral)   Resp 14  Ht '5\' 6"'  (1.676 m)   Wt 216 lb (98 kg)   SpO2 97%   BMI 34.86 kg/m   Physical Exam  Constitutional: She is oriented to person, place, and time and well-developed, well-nourished, and in no distress.  HENT:  Head: Normocephalic and atraumatic.  Right Ear: Tympanic membrane, external ear and ear canal normal.  Left Ear: Tympanic membrane, external ear and ear canal normal.  Nose: Nose normal. No mucosal edema.  Mouth/Throat: Uvula is midline, oropharynx is clear and moist and mucous membranes are normal. No oropharyngeal exudate or posterior oropharyngeal erythema.  Eyes: Conjunctivae are normal. Pupils are equal, round, and reactive to light.  Neck: Neck supple. No thyromegaly present.  Cardiovascular: Normal rate, regular rhythm, normal heart sounds and intact distal pulses.   Pulmonary/Chest: Effort normal and breath sounds normal. No respiratory distress. She has no wheezes. She has no rales.  Abdominal: Soft. Bowel sounds are normal. She exhibits no distension and no mass. There is no tenderness. There is no rebound and no guarding.  Lymphadenopathy:    She has no cervical adenopathy.  Neurological: She is alert and oriented to person, place, and time. No cranial nerve deficit.  Skin: Skin is warm and dry. No rash noted.  Psychiatric: Affect normal.  Vitals reviewed.  Recent Results (from the past 2160 hour(s))  POCT Glucose (CBG)     Status: Abnormal   Collection Time: 10/31/16  1:18 PM  Result Value Ref Range   POC Glucose 363 (A) 70 - 99 mg/dl  POCT Urinalysis Dipstick      Status: Abnormal   Collection Time: 10/31/16  1:23 PM  Result Value Ref Range   Color, UA yellow    Clarity, UA cloudy    Glucose, UA >2,000    Bilirubin, UA negative    Ketones, UA negative    Spec Grav, UA 1.020    Blood, UA negative    pH, UA 5.0    Protein, UA 15    Urobilinogen, UA 0.2    Nitrite, UA negative    Leukocytes, UA Negative Negative  CBC     Status: None   Collection Time: 10/31/16  1:54 PM  Result Value Ref Range   WBC 7.5 4.0 - 10.5 K/uL   RBC 4.46 3.87 - 5.11 Mil/uL   Platelets 307.0 150.0 - 400.0 K/uL   Hemoglobin 12.8 12.0 - 15.0 g/dL   HCT 38.6 36.0 - 46.0 %   MCV 86.6 78.0 - 100.0 fl   MCHC 33.2 30.0 - 36.0 g/dL   RDW 13.2 11.5 - 15.5 %   Assessment/Plan: Visit for preventive health examination Depression screen negative. Health Maintenance reviewed -- Declines flu, pneumonia an tetanus. Colonoscopy up-to-date. One-time HIV screen today. Mammogram ordered. Patient is s/p hysterectomy. Preventive schedule discussed and handout given in AVS. Will obtain fasting labs today.   Breast cancer screening Order for screening mammogram placed.  Diabetes mellitus without complication ([place]) Followed by Endocrinology. Continue care as directed by specialist. Declines flu and pneumonia vaccine. Eye exam and foot exam up-to-date.  Essential hypertension Well-controlled. Asymptomatic. Labs today.    Leeanne Rio, PA-C

## 2017-01-29 NOTE — Assessment & Plan Note (Signed)
Well-controlled. Asymptomatic. Labs today.

## 2017-01-30 ENCOUNTER — Other Ambulatory Visit: Payer: Self-pay | Admitting: *Deleted

## 2017-01-30 DIAGNOSIS — E785 Hyperlipidemia, unspecified: Secondary | ICD-10-CM

## 2017-01-30 LAB — HIV ANTIBODY (ROUTINE TESTING W REFLEX): HIV: NONREACTIVE

## 2017-01-30 MED ORDER — ATORVASTATIN CALCIUM 10 MG PO TABS: 10.0000 mg | ORAL_TABLET | Freq: Every day | ORAL | 1 refills | Status: DC

## 2017-02-20 ENCOUNTER — Ambulatory Visit: Payer: BLUE CROSS/BLUE SHIELD | Admitting: Endocrinology

## 2017-02-25 ENCOUNTER — Telehealth: Payer: Self-pay | Admitting: Physician Assistant

## 2017-02-25 ENCOUNTER — Ambulatory Visit (INDEPENDENT_AMBULATORY_CARE_PROVIDER_SITE_OTHER): Payer: BLUE CROSS/BLUE SHIELD | Admitting: Endocrinology

## 2017-02-25 ENCOUNTER — Other Ambulatory Visit: Payer: Self-pay | Admitting: Physician Assistant

## 2017-02-25 ENCOUNTER — Encounter: Payer: Self-pay | Admitting: Endocrinology

## 2017-02-25 VITALS — BP 118/82 | HR 102 | Ht 66.0 in | Wt 213.0 lb

## 2017-02-25 DIAGNOSIS — E119 Type 2 diabetes mellitus without complications: Secondary | ICD-10-CM | POA: Diagnosis not present

## 2017-02-25 DIAGNOSIS — M62838 Other muscle spasm: Secondary | ICD-10-CM

## 2017-02-25 LAB — POCT GLYCOSYLATED HEMOGLOBIN (HGB A1C): HEMOGLOBIN A1C: 13.5

## 2017-02-25 MED ORDER — TRESIBA FLEXTOUCH 200 UNIT/ML ~~LOC~~ SOPN: 70.0000 [IU] | PEN_INJECTOR | Freq: Every day | SUBCUTANEOUS | 11 refills | Status: DC

## 2017-02-25 MED ORDER — CYCLOBENZAPRINE HCL 10 MG PO TABS: 10.0000 mg | ORAL_TABLET | Freq: Every day | ORAL | 0 refills | Status: DC

## 2017-02-25 NOTE — Patient Instructions (Addendum)
check your blood sugar once a day.  vary the time of day when you check, between before the 3 meals, and at bedtime.  also check if you have symptoms of your blood sugar being too high or too low.  please keep a record of the readings and bring it to your next appointment here (or you can bring the meter itself).  You can write it on any piece of paper.  please call us sooner if your blood sugar goes below 70, or if you have a lot of readings over 200.   Please increase the tresiba to 70 units daily, and:  Please continue the same other diabetes meds for now.   Please call us next week, to tell us high the blood sugar is doing.   Please come back for a follow-up appointment in 1 month.

## 2017-02-25 NOTE — Telephone Encounter (Signed)
I spoke with patient about her muscle spasms. She states she has had prior before. The muscle spasms are in bilateral legs, thighs and calfs. She states if feels like tightening in her thigths and calfs. The muscle spasms comes and goes. She states she drinks plenty of water. Walking does help with the spasms. Flexeril was prescribed on 10/15/16 #30 1RF. Unsure if this was prescribed for muscle spasms in legs. Please advise.

## 2017-02-25 NOTE — Telephone Encounter (Signed)
Advised patient refill of the Flexeril was sent to the pharmacy. If problem persist will need to schedule an appointment for further evaluation. She is agreeable.

## 2017-02-25 NOTE — Progress Notes (Signed)
Subjective:    Patient ID: [name], female    DOB: July 05, 1961, 56 y.o.   MRN: [id number]  HPI Pt returns for f/u of diabetes mellitus:  DM type: 2.  Dx'ed: 2017, when she had a cbg of 400 on steroids.  Complications: none.  Therapy: insulin and 3 oral meds.   GDM: never.   DKA: never.  Severe hypoglycemia: never.   Pancreatitis: never.   Other: she is on qd insulin, at least for now.  Interval history: She takes 40 units qd, and DM meds as rx'ed.  She says she never misses meds.  She says she never misses the insulin.  She wants to add victoza.  She says she is using insulin pen properly.  No recent steroids.  Past Medical History:  Diagnosis Date  . Anemia   . Arthritis    bilateral knees  . Bronchitis    hx of  . Fibroid tumor    Had partial hysterectomy  . Gallstones   . Migraine    hx of    Past Surgical History:  Procedure Laterality Date  . ABDOMINAL HYSTERECTOMY     partial   . boil removed     . CARPAL TUNNEL RELEASE  04-15-15  . CHOLECYSTECTOMY  Oct 2012  . ELBOW SURGERY  04-15-15  . HAND SURGERY  04-15-15  . Nerve damage  04-15-15  . TOTAL KNEE ARTHROPLASTY  08/01/2012   Procedure: TOTAL KNEE ARTHROPLASTY;  Surgeon: Augustin Schooling, MD;  Location: [facility];  Service: Orthopedics;  Laterality: Right;  RIGHT TOTAL KNEE ARTHROPLASTY  . TRIGGER FINGER RELEASE  04-15-15  . TUBAL LIGATION    . WISDOM TOOTH EXTRACTION      Social History   Social History  . Marital status: Single    Spouse name: N/A  . Number of children: N/A  . Years of education: N/A   Occupational History  . Not on file.   Social History Main Topics  . Smoking status: Former Smoker    Packs/day: 0.25    Years: 20.00    Types: Cigarettes    Quit date: 12/09/2009  . Smokeless tobacco: Never Used  . Alcohol use 0.0 oz/week     Comment: social  . Drug use: No  . Sexual activity: Not Currently   Other Topics Concern  . Not on file   Social History Narrative  . No narrative on  file    Current Outpatient Prescriptions on File Prior to Visit  Medication Sig Dispense Refill  . atorvastatin (LIPITOR) 10 MG tablet Take 1 tablet (10 mg total) by mouth daily. 30 tablet 1  . BD PEN NEEDLE NANO U/F 32G X 4 MM MISC USE DAILY TO ADMINISTER INSULIN AS DIRECTED  2  . Blood Glucose Monitoring Suppl (CONTOUR USB BLOOD GLUCOSE SYS) w/Device KIT Use daily as directed. 1 kit 0  . Cinnamon 500 MG capsule Take 1,000 mg by mouth 2 (two) times daily.    Marland Kitchen Echinacea-Goldenseal (ECHINACEA COMB/GOLDEN SEAL PO) Take by mouth.    . fluticasone (FLONASE) 50 MCG/ACT nasal spray Place 2 sprays into both nostrils daily. 16 g 2  . gabapentin (NEURONTIN) 100 MG capsule TAKE 1 CAPSULE (100 MG TOTAL) BY MOUTH 2 (TWO) TIMES DAILY. 180 capsule 1  . glimepiride (AMARYL) 4 MG tablet Take 1 tablet (4 mg total) by mouth daily before breakfast. 90 tablet 1  . GLUCOSAMINE PO Take 1 tablet by mouth 2 (two) times daily.    Marland Kitchen  glucose blood (BAYER CONTOUR TEST) test strip Use twice daily as instructed to check sugars. Dx E. 11.9 100 each 12  . ibuprofen (ADVIL,MOTRIN) 200 MG tablet Take 200 mg by mouth every 6 (six) hours as needed. Reported on 02/13/2016    . loratadine (CLARITIN) 10 MG tablet TAKE 1 TABLET BY MOUTH EVERY DAY 30 tablet 2  . losartan-hydrochlorothiazide (HYZAAR) 50-12.5 MG tablet Take 1 tablet by mouth daily. 30 tablet 3  . metFORMIN (GLUCOPHAGE) 500 MG tablet Take 1 tablet (500 mg total) by mouth 2 (two) times daily with a meal. 180 tablet 3  . Misc Natural Products (HERBAL ENERGY COMPLEX PO) Take by mouth 2 (two) times daily with a meal.    . Multiple Vitamins-Minerals (WOMENS MULTIVITAMIN PO) Take by mouth daily.    . Omega-3 1000 MG CAPS Take 1 g by mouth daily.    [name] DELICA LANCETS FINE MISC Use as directed.  Dx:E11.9 100 each 12  . oxyCODONE-acetaminophen (PERCOCET) 10-325 MG tablet Take 1 tablet by mouth every 8 (eight) hours as needed for pain. 90 tablet 0  . saxagliptin HCl  (ONGLYZA) 5 MG TABS tablet Take 1 tablet (5 mg total) by mouth daily. 30 tablet 11  . vitamin B-12 (CYANOCOBALAMIN) 100 MCG tablet Take 100 mcg by mouth daily.    . vitamin C (ASCORBIC ACID) 500 MG tablet Take 1,000 mg by mouth daily.    . vitamin E 400 UNIT capsule Take 400 Units by mouth daily. Reported on 02/13/2016     No current facility-administered medications on file prior to visit.     Allergies  Allergen Reactions  . Penicillins Hives    Hives   . Tramadol Itching    Family History  Problem Relation Age of Onset  . Diabetes Father 75    Deceased  . Diabetes Mother     Living  . Hyperlipidemia Mother   . Tuberculosis Mother   . Asthma Mother   . Heart disease Maternal Uncle   . Colon cancer Maternal Uncle     dx in 72's  . Diabetes Brother   . Heart disease Maternal Aunt   . Arthritis Other     Maternal Aunts & Uncles  . Diabetes Sister   . Kidney failure Brother 30    Diseased  . Healthy Son     x1  . Esophageal cancer Neg Hx   . Rectal cancer Neg Hx   . Stomach cancer Neg Hx     BP 118/82   Pulse (!) 102   Ht _0  (1.676 m)   Wt 213 lb (96.6 kg)   SpO2 95%   BMI 34.38 kg/m   Review of Systems She denies hypoglycemia.      Objective:   Physical Exam VITAL SIGNS:  See vs page GENERAL: no distress Pulses: dorsalis pedis intact bilat.   MSK: no deformity of the feet.  CV: no leg edema.  Skin:  no ulcer on the feet, but the skin is dry.  normal color and temp on the feet.  Neuro: sensation is intact to touch on the feet.    Lab Results  Component Value Date   HGBA1C 13.5 02/25/2017      Assessment & Plan:  Insulin-requiring type 2 DM: worse  Patient is advised the following: Patient Instructions  check your blood sugar once a day.  vary the time of day when you check, between before the 3 meals, and at bedtime.  also check if  you have symptoms of your blood sugar being too high or too low.  please keep a record of the readings and bring it  to your next appointment here (or you can bring the meter itself).  You can write it on any piece of paper.  please call us sooner if your blood sugar goes below 70, or if you have a lot of readings over 200.   Please increase the tresiba to 70 units daily, and:  Please continue the same other diabetes meds for now.   Please call us next week, to tell us high the blood sugar is doing.   Please come back for a follow-up appointment in 1 month.

## 2017-02-25 NOTE — Telephone Encounter (Signed)
Pt asking if something could be called in for muscle spasms in her legs, [facility] on [address]

## 2017-02-25 NOTE — Telephone Encounter (Signed)
Ok to give her a refill of the Flexeril 15 with 0 refill. Needs office visit to assess further.

## 2017-02-26 ENCOUNTER — Other Ambulatory Visit: Payer: Self-pay | Admitting: Physician Assistant

## 2017-02-26 DIAGNOSIS — E119 Type 2 diabetes mellitus without complications: Secondary | ICD-10-CM

## 2017-03-12 ENCOUNTER — Ambulatory Visit
Admission: RE | Admit: 2017-03-12 | Discharge: 2017-03-12 | Disposition: A | Discharge status: Home / Self Care | Payer: BLUE CROSS/BLUE SHIELD | Source: Ambulatory Visit | Attending: Physician Assistant | Admitting: Physician Assistant

## 2017-03-12 DIAGNOSIS — Z1239 Encounter for other screening for malignant neoplasm of breast: Secondary | ICD-10-CM

## 2017-03-13 ENCOUNTER — Other Ambulatory Visit: Payer: BLUE CROSS/BLUE SHIELD

## 2017-03-19 LAB — HM DIABETES EYE EXAM

## 2017-03-22 ENCOUNTER — Encounter: Payer: Self-pay | Admitting: Emergency Medicine

## 2017-03-23 ENCOUNTER — Other Ambulatory Visit: Payer: Self-pay | Admitting: Physician Assistant

## 2017-03-23 ENCOUNTER — Other Ambulatory Visit: Payer: Self-pay | Admitting: Endocrinology

## 2017-03-23 DIAGNOSIS — E785 Hyperlipidemia, unspecified: Secondary | ICD-10-CM

## 2017-03-28 ENCOUNTER — Ambulatory Visit: Payer: BLUE CROSS/BLUE SHIELD | Admitting: Endocrinology

## 2017-03-30 ENCOUNTER — Other Ambulatory Visit: Payer: Self-pay | Admitting: Physician Assistant

## 2017-03-30 DIAGNOSIS — M62838 Other muscle spasm: Secondary | ICD-10-CM

## 2017-04-03 ENCOUNTER — Ambulatory Visit (INDEPENDENT_AMBULATORY_CARE_PROVIDER_SITE_OTHER): Payer: BLUE CROSS/BLUE SHIELD | Admitting: Endocrinology

## 2017-04-03 ENCOUNTER — Encounter: Payer: Self-pay | Admitting: Endocrinology

## 2017-04-03 VITALS — BP 132/84 | HR 101 | Ht 66.0 in | Wt 222.0 lb

## 2017-04-03 DIAGNOSIS — E119 Type 2 diabetes mellitus without complications: Secondary | ICD-10-CM | POA: Diagnosis not present

## 2017-04-03 NOTE — Patient Instructions (Addendum)
check your blood sugar once a day.  vary the time of day when you check, between before the 3 meals, and at bedtime.  also check if you have symptoms of your blood sugar being too high or too low.  please keep a record of the readings and bring it to your next appointment here (or you can bring the meter itself).  You can write it on any piece of paper.  please call us sooner if your blood sugar goes below 70, or if you have a lot of readings over 200.   Please continue the same medications for now. blood tests are requested for you today.  We'll let you know about the results. If it is low, we'll reduce the tresiba. If it is high, you should reduce the tresiba, and add a shot of fast-acting insulin with supper.  Please come back for a follow-up appointment in 2-3 months.

## 2017-04-03 NOTE — Progress Notes (Signed)
Subjective:    Patient ID: [name], female    DOB: 1961-01-04, 56 y.o.   MRN: [id number]  HPI Pt returns for f/u of diabetes mellitus:  DM type: 2.  Dx'ed: 2017, when she had a cbg of 400 on steroids.  Complications: none.  Therapy: insulin and 3 oral meds.   GDM: never.   DKA: never.  Severe hypoglycemia: never.   Pancreatitis: never.    Other: she is on qd insulin, at least for now.  Interval history: She says she never misses the insulin.  She says she is using insulin pen properly.  No recent steroids.  no cbg record, but states cbg's vary from 62-200.  pt states she feels well in general.   Past Medical History:  Diagnosis Date  . Anemia   . Arthritis    bilateral knees  . Bronchitis    hx of  . Fibroid tumor    Had partial hysterectomy  . Gallstones   . Migraine    hx of    Past Surgical History:  Procedure Laterality Date  . ABDOMINAL HYSTERECTOMY     partial   . boil removed     . CARPAL TUNNEL RELEASE  04-15-15  . CHOLECYSTECTOMY  Oct 2012  . ELBOW SURGERY  04-15-15  . HAND SURGERY  04-15-15  . Nerve damage  04-15-15  . TOTAL KNEE ARTHROPLASTY  08/01/2012   Procedure: TOTAL KNEE ARTHROPLASTY;  Surgeon: Augustin Schooling, MD;  Location: [facility];  Service: Orthopedics;  Laterality: Right;  RIGHT TOTAL KNEE ARTHROPLASTY  . TRIGGER FINGER RELEASE  04-15-15  . TUBAL LIGATION    . WISDOM TOOTH EXTRACTION      Social History   Social History  . Marital status: Single    Spouse name: N/A  . Number of children: N/A  . Years of education: N/A   Occupational History  . Not on file.   Social History Main Topics  . Smoking status: Former Smoker    Packs/day: 0.25    Years: 20.00    Types: Cigarettes    Quit date: 12/09/2009  . Smokeless tobacco: Never Used  . Alcohol use 0.0 oz/week     Comment: social  . Drug use: No  . Sexual activity: Not Currently   Other Topics Concern  . Not on file   Social History Narrative  . No narrative on file    Current  Outpatient Prescriptions on File Prior to Visit  Medication Sig Dispense Refill  . atorvastatin (LIPITOR) 10 MG tablet TAKE 1 TABLET (10 MG TOTAL) BY MOUTH DAILY. 30 tablet 1  . BD PEN NEEDLE NANO U/F 32G X 4 MM MISC USE DAILY TO ADMINISTER INSULIN AS DIRECTED  2  . Blood Glucose Monitoring Suppl (CONTOUR USB BLOOD GLUCOSE SYS) w/Device KIT Use daily as directed. 1 kit 0  . Cinnamon 500 MG capsule Take 1,000 mg by mouth 2 (two) times daily.    . cyclobenzaprine (FLEXERIL) 10 MG tablet Take 1 tablet (10 mg total) by mouth at bedtime. 15 tablet 0  . Echinacea-Goldenseal (ECHINACEA COMB/GOLDEN SEAL PO) Take by mouth.    . fluticasone (FLONASE) 50 MCG/ACT nasal spray Place 2 sprays into both nostrils daily. 16 g 2  . gabapentin (NEURONTIN) 100 MG capsule TAKE 1 CAPSULE (100 MG TOTAL) BY MOUTH 2 (TWO) TIMES DAILY. 180 capsule 1  . glimepiride (AMARYL) 4 MG tablet Take 1 tablet (4 mg total) by mouth daily before breakfast. 90 tablet 1  .  GLUCOSAMINE PO Take 1 tablet by mouth 2 (two) times daily.    Marland Kitchen glucose blood (BAYER CONTOUR TEST) test strip Use twice daily as instructed to check sugars. Dx E. 11.9 100 each 12  . ibuprofen (ADVIL,MOTRIN) 200 MG tablet Take 200 mg by mouth every 6 (six) hours as needed. Reported on 02/13/2016    . loratadine (CLARITIN) 10 MG tablet TAKE 1 TABLET BY MOUTH EVERY DAY 30 tablet 2  . losartan-hydrochlorothiazide (HYZAAR) 50-12.5 MG tablet Take 1 tablet by mouth daily. 30 tablet 3  . metFORMIN (GLUCOPHAGE) 500 MG tablet TAKE 1 TABLET (500 MG TOTAL) BY MOUTH 2 (TWO) TIMES DAILY WITH A MEAL. 180 tablet 3  . Misc Natural Products (HERBAL ENERGY COMPLEX PO) Take by mouth 2 (two) times daily with a meal.    . Multiple Vitamins-Minerals (WOMENS MULTIVITAMIN PO) Take by mouth daily.    . Omega-3 1000 MG CAPS Take 1 g by mouth daily.    [name] DELICA LANCETS FINE MISC Use as directed.  Dx:E11.9 100 each 12  . oxyCODONE-acetaminophen (PERCOCET) 10-325 MG tablet Take 1 tablet by  mouth every 8 (eight) hours as needed for pain. 90 tablet 0  . saxagliptin HCl (ONGLYZA) 5 MG TABS tablet Take 1 tablet (5 mg total) by mouth daily. 30 tablet 11  . vitamin B-12 (CYANOCOBALAMIN) 100 MCG tablet Take 100 mcg by mouth daily.    . vitamin C (ASCORBIC ACID) 500 MG tablet Take 1,000 mg by mouth daily.    . vitamin E 400 UNIT capsule Take 400 Units by mouth daily. Reported on 02/13/2016     No current facility-administered medications on file prior to visit.     Allergies  Allergen Reactions  . Penicillins Hives    Hives   . Tramadol Itching    Family History  Problem Relation Age of Onset  . Diabetes Father 56    Deceased  . Diabetes Mother     Living  . Hyperlipidemia Mother   . Tuberculosis Mother   . Asthma Mother   . Heart disease Maternal Uncle   . Colon cancer Maternal Uncle     dx in 61's  . Diabetes Brother   . Heart disease Maternal Aunt   . Arthritis Other     Maternal Aunts & Uncles  . Diabetes Sister   . Kidney failure Brother 30    Diseased  . Healthy Son     x1  . Breast cancer Cousin 7  . Esophageal cancer Neg Hx   . Rectal cancer Neg Hx   . Stomach cancer Neg Hx     BP 132/84   Pulse (!) 101   Ht _0  (1.676 m)   Wt 222 lb (100.7 kg)   SpO2 91%   BMI 35.83 kg/m   Review of Systems Denies LOC    Objective:   Physical Exam VITAL SIGNS:  See vs page GENERAL: no distress Pulses: dorsalis pedis intact bilat.   MSK: no deformity of the feet.  CV: no leg edema.  Skin:  no ulcer on the feet, but the skin is dry.  normal color and temp on the feet.  Neuro: sensation is intact to touch on the feet.    Fructosamine=293    Assessment & Plan:  Insulin-requiring type 2 DM: overcontrolled, given this regimen, which does match insulin to her changing needs throughout the day.  Reduce tresiba.

## 2017-04-04 NOTE — Telephone Encounter (Signed)
Please advise to continue medication

## 2017-04-05 LAB — FRUCTOSAMINE: Fructosamine: 293 umol/L — ABNORMAL HIGH (ref 190–270)

## 2017-04-05 MED ORDER — TRESIBA FLEXTOUCH 200 UNIT/ML ~~LOC~~ SOPN: 60.0000 [IU] | PEN_INJECTOR | Freq: Every day | SUBCUTANEOUS | 11 refills | Status: DC

## 2017-04-09 ENCOUNTER — Ambulatory Visit (INDEPENDENT_AMBULATORY_CARE_PROVIDER_SITE_OTHER): Payer: BLUE CROSS/BLUE SHIELD | Admitting: Physician Assistant

## 2017-04-09 ENCOUNTER — Encounter: Payer: Self-pay | Admitting: Physician Assistant

## 2017-04-09 VITALS — BP 112/70 | HR 94 | Temp 98.7°F | Resp 14 | Ht 66.0 in | Wt 218.0 lb

## 2017-04-09 DIAGNOSIS — E785 Hyperlipidemia, unspecified: Secondary | ICD-10-CM | POA: Diagnosis not present

## 2017-04-09 DIAGNOSIS — M62838 Other muscle spasm: Secondary | ICD-10-CM | POA: Diagnosis not present

## 2017-04-09 DIAGNOSIS — M5412 Radiculopathy, cervical region: Secondary | ICD-10-CM | POA: Diagnosis not present

## 2017-04-09 DIAGNOSIS — E1142 Type 2 diabetes mellitus with diabetic polyneuropathy: Secondary | ICD-10-CM

## 2017-04-09 LAB — COMPREHENSIVE METABOLIC PANEL
ALBUMIN: 4.1 g/dL (ref 3.6–5.1)
ALK PHOS: 105 U/L (ref 33–130)
ALT: 15 U/L (ref 6–29)
AST: 24 U/L (ref 10–35)
BILIRUBIN TOTAL: 0.3 mg/dL (ref 0.2–1.2)
BUN: 23 mg/dL (ref 7–25)
CALCIUM: 9.3 mg/dL (ref 8.6–10.4)
CO2: 21 mmol/L (ref 20–31)
Chloride: 103 mmol/L (ref 98–110)
Creat: 1 mg/dL (ref 0.50–1.05)
Glucose, Bld: 139 mg/dL — ABNORMAL HIGH (ref 65–99)
POTASSIUM: 4.8 mmol/L (ref 3.5–5.3)
Sodium: 138 mmol/L (ref 135–146)
Total Protein: 7.1 g/dL (ref 6.1–8.1)

## 2017-04-09 MED ORDER — MELOXICAM 7.5 MG PO TABS: 7.5000 mg | ORAL_TABLET | Freq: Every day | ORAL | 0 refills | Status: DC

## 2017-04-09 MED ORDER — CYCLOBENZAPRINE HCL 10 MG PO TABS: 10.0000 mg | ORAL_TABLET | Freq: Every day | ORAL | 1 refills | Status: DC

## 2017-04-09 NOTE — Progress Notes (Signed)
Patient presents to clinic today for follow-up of hyperlipidemia after starting lipitor 10 mg daily. Patient was originally placed on lipitor in February with instructions to return in 6 weeks for repeat LFTs. Patient has not done so. Patient endorses taking medications as directed. Denies side effect of medication. Is watching diet and following with Endocrinology for DM II.   Patient also with intermittent leg cramping despite adequate diet and hydration. Happening mainly at night. Does note intermittent cervical neck spams secondary to chronic upper back pain. .   Past Medical History:  Diagnosis Date  . Anemia   . Arthritis    bilateral knees  . Bronchitis    hx of  . Fibroid tumor    Had partial hysterectomy  . Gallstones   . Migraine    hx of    Current Outpatient Prescriptions on File Prior to Visit  Medication Sig Dispense Refill  . atorvastatin (LIPITOR) 10 MG tablet TAKE 1 TABLET (10 MG TOTAL) BY MOUTH DAILY. 30 tablet 1  . BD PEN NEEDLE NANO U/F 32G X 4 MM MISC USE DAILY TO ADMINISTER INSULIN AS DIRECTED  2  . Blood Glucose Monitoring Suppl (CONTOUR USB BLOOD GLUCOSE SYS) w/Device KIT Use daily as directed. 1 kit 0  . Cinnamon 500 MG capsule Take 1,000 mg by mouth 2 (two) times daily.    Marland Kitchen Echinacea-Goldenseal (ECHINACEA COMB/GOLDEN SEAL PO) Take by mouth.    . fluticasone (FLONASE) 50 MCG/ACT nasal spray Place 2 sprays into both nostrils daily. 16 g 2  . gabapentin (NEURONTIN) 100 MG capsule TAKE 1 CAPSULE (100 MG TOTAL) BY MOUTH 2 (TWO) TIMES DAILY. 180 capsule 1  . glimepiride (AMARYL) 4 MG tablet Take 1 tablet (4 mg total) by mouth daily before breakfast. 90 tablet 1  . GLUCOSAMINE PO Take 1 tablet by mouth 2 (two) times daily.    Marland Kitchen glucose blood (BAYER CONTOUR TEST) test strip Use twice daily as instructed to check sugars. Dx E. 11.9 100 each 12  . ibuprofen (ADVIL,MOTRIN) 200 MG tablet Take 200 mg by mouth every 6 (six) hours as needed. Reported on 02/13/2016    .  loratadine (CLARITIN) 10 MG tablet TAKE 1 TABLET BY MOUTH EVERY DAY 30 tablet 2  . losartan-hydrochlorothiazide (HYZAAR) 50-12.5 MG tablet Take 1 tablet by mouth daily. 30 tablet 3  . metFORMIN (GLUCOPHAGE) 500 MG tablet TAKE 1 TABLET (500 MG TOTAL) BY MOUTH 2 (TWO) TIMES DAILY WITH A MEAL. 180 tablet 3  . Misc Natural Products (HERBAL ENERGY COMPLEX PO) Take by mouth 2 (two) times daily with a meal.    . Multiple Vitamins-Minerals (WOMENS MULTIVITAMIN PO) Take by mouth daily.    . Omega-3 1000 MG CAPS Take 1 g by mouth daily.    [name] DELICA LANCETS FINE MISC Use as directed.  Dx:E11.9 100 each 12  . oxyCODONE-acetaminophen (PERCOCET) 10-325 MG tablet Take 1 tablet by mouth every 8 (eight) hours as needed for pain. 90 tablet 0  . saxagliptin HCl (ONGLYZA) 5 MG TABS tablet Take 1 tablet (5 mg total) by mouth daily. 30 tablet 11  . TRESIBA FLEXTOUCH 200 UNIT/ML SOPN Inject 60 Units into the skin daily. 6 pen 11  . vitamin B-12 (CYANOCOBALAMIN) 100 MCG tablet Take 100 mcg by mouth daily.    . vitamin C (ASCORBIC ACID) 500 MG tablet Take 1,000 mg by mouth daily.    . vitamin E 400 UNIT capsule Take 400 Units by mouth daily. Reported on 02/13/2016  No current facility-administered medications on file prior to visit.     Allergies  Allergen Reactions  . Penicillins Hives    Hives   . Tramadol Itching    Family History  Problem Relation Age of Onset  . Diabetes Father 58    Deceased  . Diabetes Mother     Living  . Hyperlipidemia Mother   . Tuberculosis Mother   . Asthma Mother   . Heart disease Maternal Uncle   . Colon cancer Maternal Uncle     dx in 54's  . Diabetes Brother   . Heart disease Maternal Aunt   . Arthritis Other     Maternal Aunts & Uncles  . Diabetes Sister   . Kidney failure Brother 30    Diseased  . Healthy Son     x1  . Breast cancer Cousin 71  . Esophageal cancer Neg Hx   . Rectal cancer Neg Hx   . Stomach cancer Neg Hx     Social History    Social History  . Marital status: Single    Spouse name: N/A  . Number of children: N/A  . Years of education: N/A   Social History Main Topics  . Smoking status: Former Smoker    Packs/day: 0.25    Years: 20.00    Types: Cigarettes    Quit date: 12/09/2009  . Smokeless tobacco: Never Used  . Alcohol use 0.0 oz/week     Comment: social  . Drug use: No  . Sexual activity: Not Currently   Other Topics Concern  . None   Social History Narrative  . None   Review of Systems - See HPI.  All other ROS are negative.  BP 112/70   Pulse 94   Temp 98.7 F (37.1 C) (Oral)   Resp 14   Ht _0  (1.676 m)   Wt 218 lb (98.9 kg)   SpO2 98%   BMI 35.19 kg/m   Physical Exam  Constitutional: She is oriented to person, place, and time and well-developed, well-nourished, and in no distress.  HENT:  Head: Normocephalic and atraumatic.  Cardiovascular: Normal rate, regular rhythm and normal heart sounds.   Pulmonary/Chest: Effort normal.  Neurological: She is alert and oriented to person, place, and time.  Skin: Skin is warm and dry. No rash noted.  Psychiatric: Affect normal.  Vitals reviewed.   Recent Results (from the past 2160 hour(s))  Comp Met (CMET)     Status: Abnormal   Collection Time: 01/29/17 11:50 AM  Result Value Ref Range   Sodium 134 (L) 135 - 145 mEq/L   Potassium 4.4 3.5 - 5.1 mEq/L   Chloride 100 96 - 112 mEq/L   CO2 30 19 - 32 mEq/L   Glucose, Bld 276 (H) 70 - 99 mg/dL   BUN 20 6 - 23 mg/dL   Creatinine, Ser 0.95 0.40 - 1.20 mg/dL   Total Bilirubin 0.4 0.2 - 1.2 mg/dL   Alkaline Phosphatase 117 39 - 117 U/L   AST 16 0 - 37 U/L   ALT 23 0 - 35 U/L   Total Protein 7.2 6.0 - 8.3 g/dL   Albumin 4.4 3.5 - 5.2 g/dL   Calcium 10.3 8.4 - 10.5 mg/dL   GFR 77.89 >60.00 mL/min  Lipid Profile     Status: Abnormal   Collection Time: 01/29/17 11:50 AM  Result Value Ref Range   Cholesterol 192 0 - 200 mg/dL    Comment: ATP III Classification  Desirable:   < 200 mg/dL               Borderline High:  200 - 239 mg/dL          High:  > = 240 mg/dL   Triglycerides 81.0 0.0 - 149.0 mg/dL    Comment: Normal:  <150 mg/dLBorderline High:  150 - 199 mg/dL   HDL 66.40 >39.00 mg/dL   VLDL 16.2 0.0 - 40.0 mg/dL   LDL Cholesterol 110 (H) 0 - 99 mg/dL   Total CHOL/HDL Ratio 3     Comment:                Men          Women1/2 Average Risk     3.4          3.3Average Risk          5.0          4.42X Average Risk          9.6          7.13X Average Risk          15.0          11.0                       NonHDL 125.93     Comment: NOTE:  Non-HDL goal should be 30 mg/dL higher than patient's LDL goal (i.e. LDL goal of < 70 mg/dL, would have non-HDL goal of < 100 mg/dL)  CBC     Status: None   Collection Time: 01/29/17 11:50 AM  Result Value Ref Range   WBC 5.0 4.0 - 10.5 K/uL   RBC 4.33 3.87 - 5.11 Mil/uL   Platelets 289.0 150.0 - 400.0 K/uL   Hemoglobin 12.3 12.0 - 15.0 g/dL   HCT 37.9 36.0 - 46.0 %   MCV 87.5 78.0 - 100.0 fl   MCHC 32.6 30.0 - 36.0 g/dL   RDW 13.6 11.5 - 15.5 %  TSH     Status: None   Collection Time: 01/29/17 11:50 AM  Result Value Ref Range   TSH 0.93 0.35 - 4.50 uIU/mL  Urinalysis, Routine w reflex microscopic     Status: Abnormal   Collection Time: 01/29/17 11:50 AM  Result Value Ref Range   Color, Urine YELLOW Yellow;Lt. Yellow   APPearance CLEAR Clear   Specific Gravity, Urine 1.015 1.000 - 1.030   pH 6.0 5.0 - 8.0   Total Protein, Urine NEGATIVE Negative   Urine Glucose >=1000 (A) Negative   Ketones, ur NEGATIVE Negative   Bilirubin Urine NEGATIVE Negative   Hgb urine dipstick NEGATIVE Negative   Urobilinogen, UA 0.2 0.0 - 1.0   Leukocytes, UA NEGATIVE Negative   Nitrite NEGATIVE Negative   WBC, UA 0-2/hpf 0-2/hpf   RBC / HPF none seen 0-2/hpf   Squamous Epithelial / LPF Rare(0-4/hpf) Rare(0-4/hpf)   Yeast, UA Presence of (A) None  HIV antibody     Status: None   Collection Time: 01/29/17 11:50 AM  Result Value Ref  Range   HIV 1&2 Ab, 4th Generation NONREACTIVE NONREACTIVE    Comment:   HIV-1 antigen and HIV-1/HIV-2 antibodies were not detected.  There is no laboratory evidence of HIV infection.   HIV-1/2 Antibody Diff        Not indicated. HIV-1 RNA, Qual TMA          Not indicated.     PLEASE NOTE: This  information has been disclosed to you from records whose confidentiality may be protected by state law. If your state requires such protection, then the state law prohibits you from making any further disclosure of the information without the specific written consent of the person to whom it pertains, or as otherwise permitted by law. A general authorization for the release of medical or other information is NOT sufficient for this purpose.   The performance of this assay has not been clinically validated in patients less than 68 years old.   For additional information please refer to http://education.[facility].com/faq/FAQ106.  (This link is being provided for informational/educational purposes only.)     POCT glycosylated hemoglobin (Hb A1C)     Status: None   Collection Time: 02/25/17  3:39 PM  Result Value Ref Range   Hemoglobin A1C 13.5   HM DIABETES EYE EXAM     Status: None   Collection Time: 03/19/17 12:00 AM  Result Value Ref Range   HM Diabetic Eye Exam No Retinopathy No Retinopathy  Fructosamine     Status: Abnormal   Collection Time: 04/03/17  4:04 PM  Result Value Ref Range   Fructosamine 293 (H) 190 - 270 umol/L    Assessment/Plan: 1. Hyperlipidemia, unspecified hyperlipidemia type Tolerating statin well without side effect. Repeat CMP today. - Comp Met (CMET)   2.  Diabetic polyneuropathy associated with type 2 diabetes mellitus ([place]) Followed by Endocrinology. Will increase Gabapentin to TID - 100, 100, 300 over next week. Will call patient 1 week to see how she is doing. CMP today.  4. Cervical radiculopathy With muscle spasms at night occasionally.  Refill Flexeril. Gabapentin increased. Mobic as directed. Will attempt to titrate gabapentin and remove mobic. Follow-up with Neurosurgery as scheduled.    Leeanne Rio, PA-C

## 2017-04-09 NOTE — Patient Instructions (Signed)
Please increase Gabapentin as follows: Take 1 tablet (100 mg) in the morning. Take 1 tablet (100 mg) from noon to 1PM Take 3 tablets in the evening (300 mg) near bedtime.   Continue Flexeril using in the late afternoon/evening only. Meloxicam once daily for now. Will hopefully be able to stop when we get Gabapentin up to a therapeutic dose  Continue cholesterol medication as directed for now.  We will alter based on lab results.

## 2017-04-09 NOTE — Progress Notes (Signed)
Pre visit review using our clinic review tool, if applicable. No additional management support is needed unless otherwise documented below in the visit note. 

## 2017-04-10 ENCOUNTER — Encounter: Payer: Self-pay | Admitting: Emergency Medicine

## 2017-04-12 ENCOUNTER — Other Ambulatory Visit: Payer: Self-pay | Admitting: Physician Assistant

## 2017-04-12 DIAGNOSIS — E119 Type 2 diabetes mellitus without complications: Secondary | ICD-10-CM

## 2017-04-16 IMAGING — DX DG CERVICAL SPINE COMPLETE 4+V
6 series · 6 of 6 positions shown · non-contrast
Comparison: None.

CLINICAL DATA: Right-sided neck pain radiating across the right
shoulder and right arm for 3 weeks. No injury.

EXAM:
CERVICAL SPINE - COMPLETE 4+ VIEW

[c-spine lat]
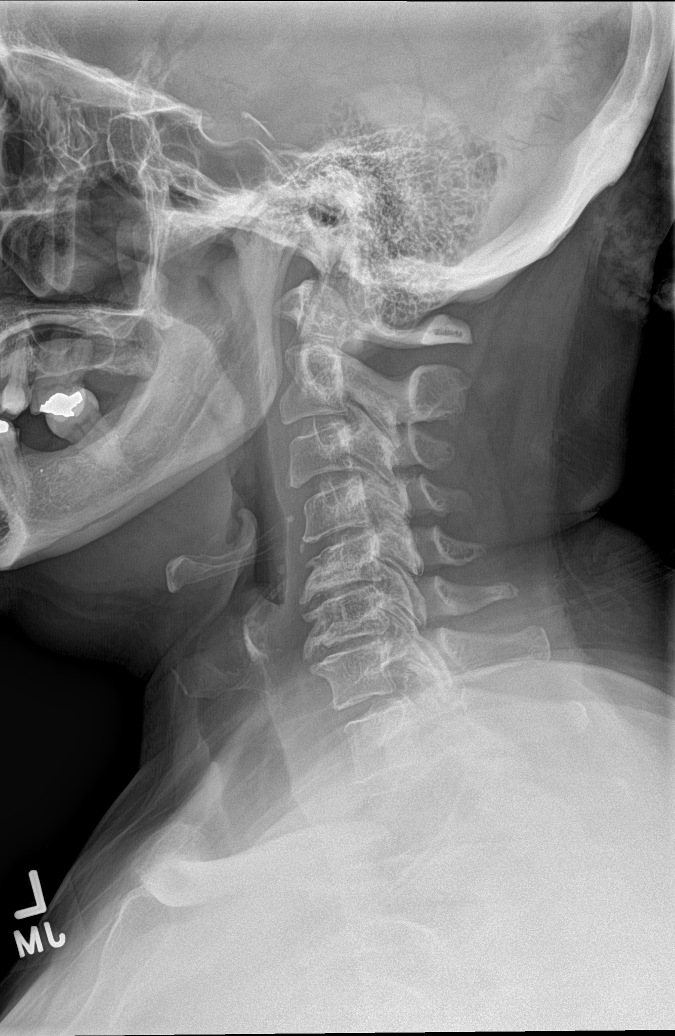

[c-spine obl (1 of 2)]
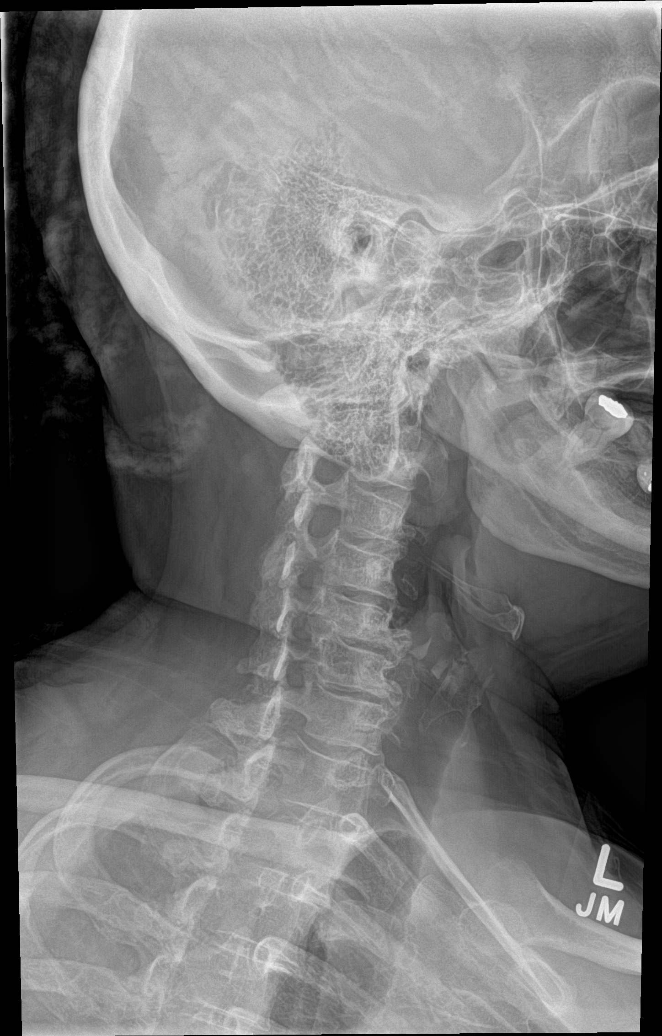

[c-spine obl (2 of 2)]
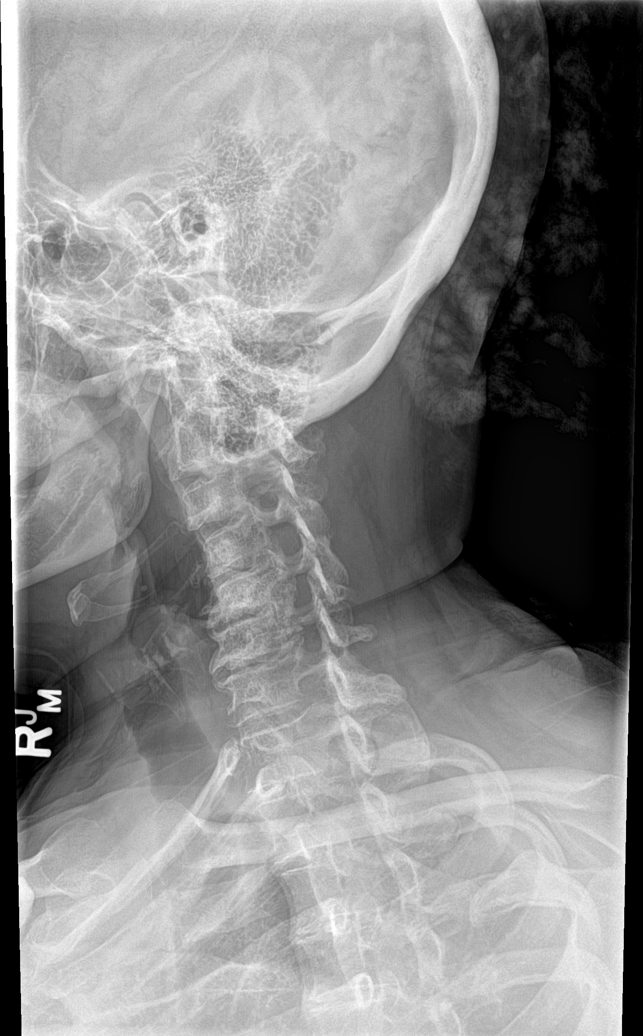

[c-spine ap]
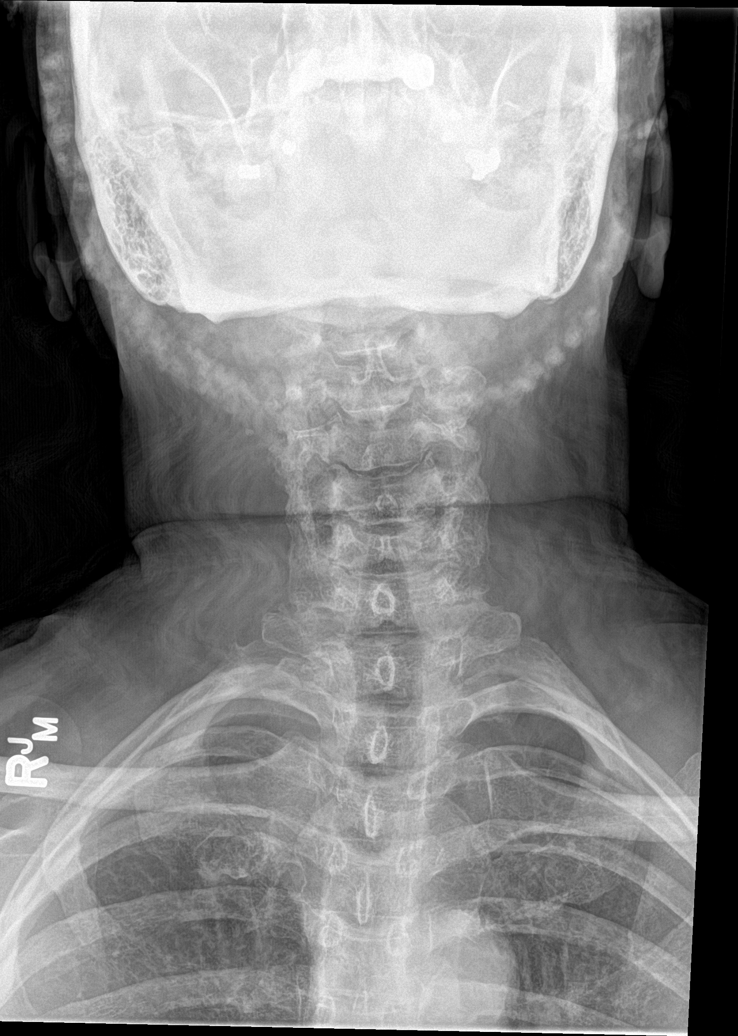

[c-spine open mouth]
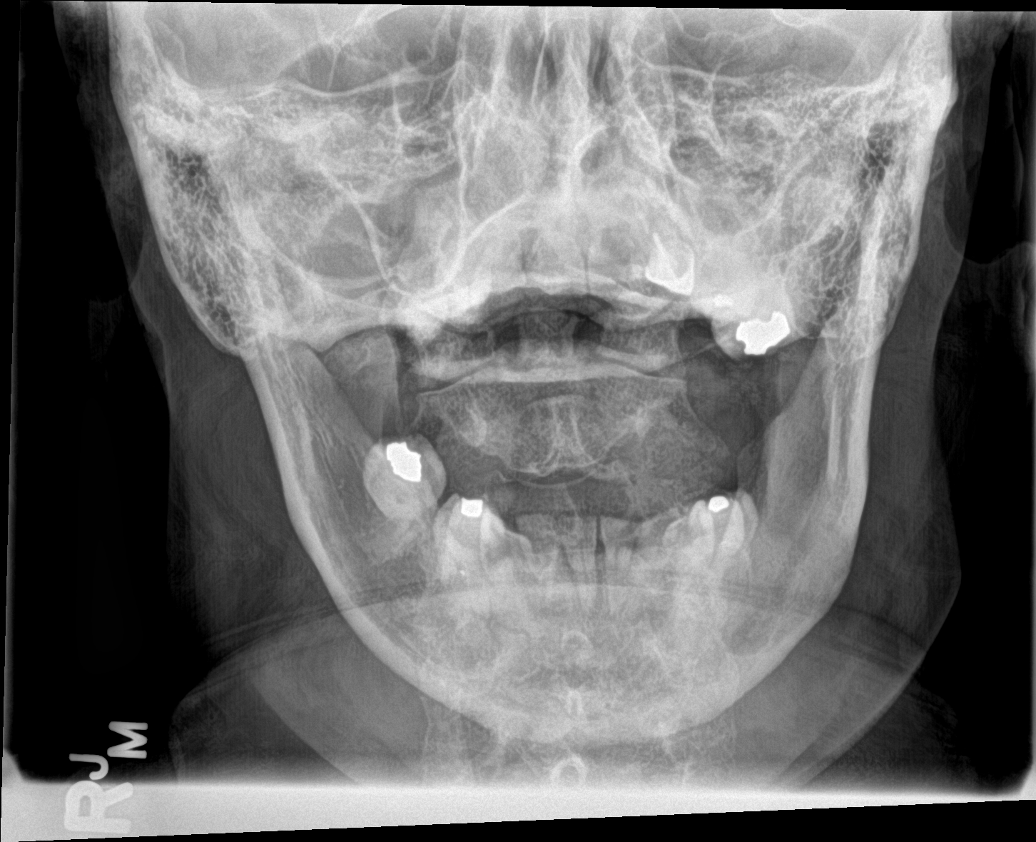

[[person_name]]
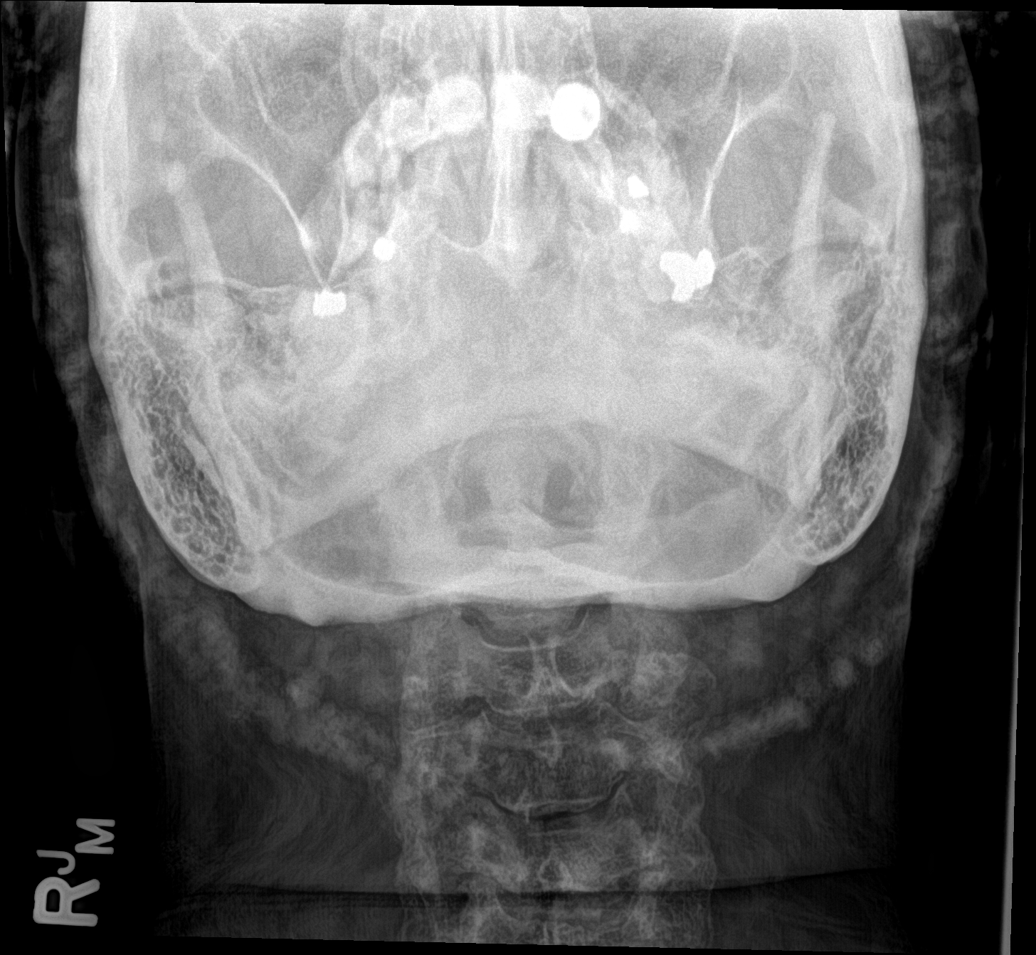

[6 of 6 positions shown; findings below may reference images not displayed]

FINDINGS: No fracture or spondylolisthesis. There is straightening of the
normal cervical lordosis.

There is mild loss disc height at C4-C5 with moderate loss of disc
height at C5-C6 and C6-C7.

Endplate and uncovertebral spurring noted at C4-C5 through C6-C7.
There is mild neural foraminal narrowing bilaterally at these
levels.

Soft tissues are unremarkable.
IMPRESSION: 1. No fracture or acute finding.
2. Disc degenerative changes from C4-C5 through C6-C7 with mild
bilateral neural foraminal narrowing.

## 2017-04-22 ENCOUNTER — Telehealth: Payer: Self-pay | Admitting: Physician Assistant

## 2017-04-22 MED ORDER — GABAPENTIN 300 MG PO CAPS: 300.0000 mg | ORAL_CAPSULE | Freq: Three times a day (TID) | ORAL | 3 refills | Status: DC

## 2017-04-22 NOTE — Telephone Encounter (Signed)
Spoke with patient today following up regarding response to increased Gabapentin. Is tolerating well with some improvement. Is still having issue with neuropathic pains. Discussed options including further titration of Gabapentin versus trial or Lyrica. She elects titration of Gabapentin. Will increase to 300 mg TID. New Rx sent. Will follow-up 1 week.

## 2017-04-30 ENCOUNTER — Other Ambulatory Visit: Payer: Self-pay | Admitting: Endocrinology

## 2017-05-15 ENCOUNTER — Other Ambulatory Visit: Payer: Self-pay | Admitting: Emergency Medicine

## 2017-05-15 MED ORDER — LOSARTAN POTASSIUM-HCTZ 50-12.5 MG PO TABS: 1.0000 | ORAL_TABLET | Freq: Every day | ORAL | 1 refills | Status: DC

## 2017-05-23 NOTE — Progress Notes (Signed)
Patient presents to clinic today c/o bumpy rash of R forearm x a few weeks. Patient endorses getting a similar rash each summer. Denies change to soaps, lotions, detergents, etc. Denies contact with similar rash. States rash is not pruritic or painful. Has tried OTC lotions for relief of symptoms. Notes only mild relief. Denies noted rash elsewhere. States this is where the rash occurs each year.   Past Medical History:  Diagnosis Date  . Anemia   . Arthritis    bilateral knees  . Bronchitis    hx of  . Fibroid tumor    Had partial hysterectomy  . Gallstones   . Migraine    hx of    Current Outpatient Prescriptions on File Prior to Visit  Medication Sig Dispense Refill  . atorvastatin (LIPITOR) 10 MG tablet TAKE 1 TABLET (10 MG TOTAL) BY MOUTH DAILY. 30 tablet 1  . BAYER CONTOUR TEST test strip USE TWICE DAILY AS INSTRUCTED TO CHECK SUGARS. DX E. 11.9 100 each 5  . BD PEN NEEDLE NANO U/F 32G X 4 MM MISC USE DAILY TO ADMINISTER INSULIN AS DIRECTED  2  . Blood Glucose Monitoring Suppl (CONTOUR USB BLOOD GLUCOSE SYS) w/Device KIT Use daily as directed. 1 kit 0  . Cinnamon 500 MG capsule Take 1,000 mg by mouth 2 (two) times daily.    . cyclobenzaprine (FLEXERIL) 10 MG tablet Take 1 tablet (10 mg total) by mouth at bedtime. 30 tablet 1  . Echinacea-Goldenseal (ECHINACEA COMB/GOLDEN SEAL PO) Take by mouth.    . fluticasone (FLONASE) 50 MCG/ACT nasal spray Place 2 sprays into both nostrils daily. 16 g 2  . glimepiride (AMARYL) 4 MG tablet Take 1 tablet (4 mg total) by mouth daily before breakfast. 90 tablet 1  . GLUCOSAMINE PO Take 1 tablet by mouth 2 (two) times daily.    Marland Kitchen ibuprofen (ADVIL,MOTRIN) 200 MG tablet Take 200 mg by mouth every 6 (six) hours as needed. Reported on 02/13/2016    . loratadine (CLARITIN) 10 MG tablet TAKE 1 TABLET BY MOUTH EVERY DAY 30 tablet 2  . losartan-hydrochlorothiazide (HYZAAR) 50-12.5 MG tablet Take 1 tablet by mouth daily. 90 tablet 1  . meloxicam (MOBIC)  7.5 MG tablet Take 1 tablet (7.5 mg total) by mouth daily. 30 tablet 0  . metFORMIN (GLUCOPHAGE) 500 MG tablet TAKE 1 TABLET (500 MG TOTAL) BY MOUTH 2 (TWO) TIMES DAILY WITH A MEAL. 180 tablet 3  . Misc Natural Products (HERBAL ENERGY COMPLEX PO) Take by mouth 2 (two) times daily with a meal.    . Multiple Vitamins-Minerals (WOMENS MULTIVITAMIN PO) Take by mouth daily.    . Omega-3 1000 MG CAPS Take 1 g by mouth daily.    [name] DELICA LANCETS FINE MISC Use as directed.  Dx:E11.9 100 each 12  . ONGLYZA 5 MG TABS tablet TAKE 1 TABLET (5 MG TOTAL) BY MOUTH DAILY. 30 tablet 11  . oxyCODONE-acetaminophen (PERCOCET) 10-325 MG tablet Take 1 tablet by mouth every 8 (eight) hours as needed for pain. 90 tablet 0  . TRESIBA FLEXTOUCH 200 UNIT/ML SOPN Inject 60 Units into the skin daily. 6 pen 11  . vitamin B-12 (CYANOCOBALAMIN) 100 MCG tablet Take 100 mcg by mouth daily.    . vitamin C (ASCORBIC ACID) 500 MG tablet Take 1,000 mg by mouth daily.    . vitamin E 400 UNIT capsule Take 400 Units by mouth daily. Reported on 02/13/2016     No current facility-administered medications on file  prior to visit.     Allergies  Allergen Reactions  . Penicillins Hives    Hives   . Tramadol Itching    Family History  Problem Relation Age of Onset  . Diabetes Father 94       Deceased  . Diabetes Mother        Living  . Hyperlipidemia Mother   . Tuberculosis Mother   . Asthma Mother   . Heart disease Maternal Uncle   . Colon cancer Maternal Uncle        dx in 102's  . Diabetes Brother   . Heart disease Maternal Aunt   . Arthritis Other        Maternal Aunts & Uncles  . Diabetes Sister   . Kidney failure Brother 30       Diseased  . Healthy Son        x1  . Breast cancer Cousin 39  . Esophageal cancer Neg Hx   . Rectal cancer Neg Hx   . Stomach cancer Neg Hx     Social History   Social History  . Marital status: Single    Spouse name: N/A  . Number of children: N/A  . Years of education:  N/A   Social History Main Topics  . Smoking status: Former Smoker    Packs/day: 0.25    Years: 20.00    Types: Cigarettes    Quit date: 12/09/2009  . Smokeless tobacco: Never Used  . Alcohol use 0.0 oz/week     Comment: social  . Drug use: No  . Sexual activity: Not Currently   Other Topics Concern  . None   Social History Narrative  . None    Review of Systems - See HPI.  All other ROS are negative.  BP 110/70   Pulse 84   Temp 98.1 F (36.7 C) (Oral)   Resp 14   Ht _0  (1.676 m)   Wt 219 lb (99.3 kg)   SpO2 98%   BMI 35.35 kg/m   Physical Exam  Constitutional: She is oriented to person, place, and time and well-developed, well-nourished, and in no distress.  HENT:  Head: Normocephalic and atraumatic.  Eyes: Conjunctivae are normal.  Neck: Neck supple.  Cardiovascular: Normal rate, regular rhythm and normal heart sounds.   Pulmonary/Chest: Effort normal.  Neurological: She is alert and oriented to person, place, and time.  Skin: Skin is warm and dry.  4 cm area of hyperpigmentation and xerotic skin noted on R dorsal forearm, corresponding with area of concern. Some noted keratosis pilaris of this area and surrounding skin. No noted erythema or sign of excoriation. No rash noted elsewhere.  Vitals reviewed.   Recent Results (from the past 2160 hour(s))  POCT glycosylated hemoglobin (Hb A1C)     Status: None   Collection Time: 02/25/17  3:39 PM  Result Value Ref Range   Hemoglobin A1C 13.5   HM DIABETES EYE EXAM     Status: None   Collection Time: 03/19/17 12:00 AM  Result Value Ref Range   HM Diabetic Eye Exam No Retinopathy No Retinopathy  Fructosamine     Status: Abnormal   Collection Time: 04/03/17  4:04 PM  Result Value Ref Range   Fructosamine 293 (H) 190 - 270 umol/L  Comp Met (CMET)     Status: Abnormal   Collection Time: 04/09/17  4:32 PM  Result Value Ref Range   Sodium 138 135 - 146 mmol/L   Potassium  4.8 3.5 - 5.3 mmol/L   Chloride 103 98  - 110 mmol/L   CO2 21 20 - 31 mmol/L   Glucose, Bld 139 (H) 65 - 99 mg/dL   BUN 23 7 - 25 mg/dL   Creat 1.00 0.50 - 1.05 mg/dL    Comment:   For patients > or = 58 years of age: The upper reference limit for Creatinine is approximately 13% higher for people identified as African-American.      Total Bilirubin 0.3 0.2 - 1.2 mg/dL   Alkaline Phosphatase 105 33 - 130 U/L   AST 24 10 - 35 U/L   ALT 15 6 - 29 U/L   Total Protein 7.1 6.1 - 8.1 g/dL   Albumin 4.1 3.6 - 5.1 g/dL   Calcium 9.3 8.6 - 10.4 mg/dL    Assessment/Plan: Rash and nonspecific skin eruption Eczematous characteristics noted. There is also some evidence of mild keratosis pilaris present. Discussed OTC lotions. Will start steroid cream for the eczematous area. Dermatology if not improving.     Leeanne Rio, PA-C

## 2017-05-24 ENCOUNTER — Ambulatory Visit (INDEPENDENT_AMBULATORY_CARE_PROVIDER_SITE_OTHER): Payer: BLUE CROSS/BLUE SHIELD | Admitting: Physician Assistant

## 2017-05-24 ENCOUNTER — Encounter: Payer: Self-pay | Admitting: Physician Assistant

## 2017-05-24 ENCOUNTER — Other Ambulatory Visit: Payer: Self-pay | Admitting: Emergency Medicine

## 2017-05-24 VITALS — BP 110/70 | HR 84 | Temp 98.1°F | Resp 14 | Ht 66.0 in | Wt 219.0 lb

## 2017-05-24 DIAGNOSIS — R21 Rash and other nonspecific skin eruption: Secondary | ICD-10-CM

## 2017-05-24 MED ORDER — TRIAMCINOLONE ACETONIDE 0.1 % EX CREA: 1.0000 "application " | TOPICAL_CREAM | Freq: Two times a day (BID) | CUTANEOUS | 0 refills | Status: DC

## 2017-05-24 MED ORDER — GABAPENTIN 300 MG PO CAPS: 300.0000 mg | ORAL_CAPSULE | Freq: Three times a day (TID) | ORAL | 1 refills | Status: DC

## 2017-05-24 NOTE — Patient Instructions (Addendum)
PLease keep skin and dry.  Apply steroid cream twice daily for 2 weeks. Apply an over-the-counter bumpy skin cream -- Eucerin has a good option.   If not improving, we will need Dermatology assessment.

## 2017-05-24 NOTE — Progress Notes (Signed)
Pre visit review using our clinic review tool, if applicable. No additional management support is needed unless otherwise documented below in the visit note. 

## 2017-05-25 DIAGNOSIS — R21 Rash and other nonspecific skin eruption: Secondary | ICD-10-CM | POA: Insufficient documentation

## 2017-05-25 NOTE — Assessment & Plan Note (Signed)
Eczematous characteristics noted. There is also some evidence of mild keratosis pilaris present. Discussed OTC lotions. Will start steroid cream for the eczematous area. Dermatology if not improving.

## 2017-05-28 ENCOUNTER — Other Ambulatory Visit: Payer: Self-pay | Admitting: Physician Assistant

## 2017-05-28 DIAGNOSIS — M4722 Other spondylosis with radiculopathy, cervical region: Secondary | ICD-10-CM

## 2017-06-07 ENCOUNTER — Other Ambulatory Visit: Payer: Self-pay | Admitting: Endocrinology

## 2017-06-07 ENCOUNTER — Other Ambulatory Visit: Payer: Self-pay | Admitting: Physician Assistant

## 2017-06-07 DIAGNOSIS — E785 Hyperlipidemia, unspecified: Secondary | ICD-10-CM

## 2017-06-11 ENCOUNTER — Ambulatory Visit
Admission: RE | Admit: 2017-06-11 | Discharge: 2017-06-11 | Disposition: A | Discharge status: Home / Self Care | Payer: BLUE CROSS/BLUE SHIELD | Source: Ambulatory Visit | Attending: Physician Assistant | Admitting: Physician Assistant

## 2017-06-11 DIAGNOSIS — M4722 Other spondylosis with radiculopathy, cervical region: Secondary | ICD-10-CM

## 2017-06-19 ENCOUNTER — Other Ambulatory Visit: Payer: Self-pay

## 2017-06-19 MED ORDER — GLIMEPIRIDE 4 MG PO TABS: 4.0000 mg | ORAL_TABLET | Freq: Every day | ORAL | 1 refills | Status: DC

## 2017-06-30 ENCOUNTER — Other Ambulatory Visit: Payer: Self-pay | Admitting: Physician Assistant

## 2017-06-30 DIAGNOSIS — M62838 Other muscle spasm: Secondary | ICD-10-CM

## 2017-07-01 ENCOUNTER — Encounter: Payer: Self-pay | Admitting: Physician Assistant

## 2017-07-01 ENCOUNTER — Ambulatory Visit (INDEPENDENT_AMBULATORY_CARE_PROVIDER_SITE_OTHER): Payer: BLUE CROSS/BLUE SHIELD | Admitting: Physician Assistant

## 2017-07-01 VITALS — BP 130/88 | HR 86 | Temp 98.3°F | Resp 14 | Ht 66.0 in | Wt 224.0 lb

## 2017-07-01 DIAGNOSIS — M5412 Radiculopathy, cervical region: Secondary | ICD-10-CM

## 2017-07-01 DIAGNOSIS — M170 Bilateral primary osteoarthritis of knee: Secondary | ICD-10-CM

## 2017-07-01 NOTE — Progress Notes (Signed)
Patient presented to clinic today for completion of handicap placard forms from the [facility]. Patient with history of osteoarthritis and cervical radiculopathy. Also chronic lumbago. She is unable to ambulate more than 200 ft at a time without needing to rest. Patient also with diabetic neuropathy which can affect balance. Forms signed.

## 2017-07-01 NOTE — Patient Instructions (Signed)
Please speak with your surgeon about the potential to start yoga stretches for low-intensity exercise and to help with chronic pain.  Would recommend continued water aerobics. You can check with insurance to see if they will cover cost of a gym membership with a prescription. If so, I can provide.

## 2017-07-01 NOTE — Progress Notes (Signed)
Pre visit review using our clinic review tool, if applicable. No additional management support is needed unless otherwise documented below in the visit note. 

## 2017-07-02 ENCOUNTER — Encounter: Payer: Self-pay | Admitting: Endocrinology

## 2017-07-02 ENCOUNTER — Ambulatory Visit (INDEPENDENT_AMBULATORY_CARE_PROVIDER_SITE_OTHER): Payer: BLUE CROSS/BLUE SHIELD | Admitting: Endocrinology

## 2017-07-02 VITALS — BP 150/102 | HR 94 | Wt 222.8 lb

## 2017-07-02 DIAGNOSIS — E119 Type 2 diabetes mellitus without complications: Secondary | ICD-10-CM | POA: Diagnosis not present

## 2017-07-02 NOTE — Patient Instructions (Addendum)
check your blood sugar once a day.  vary the time of day when you check, between before the 3 meals, and at bedtime.  also check if you have symptoms of your blood sugar being too high or too low.  please keep a record of the readings and bring it to your next appointment here (or you can bring the meter itself).  You can write it on any piece of paper.  please call us sooner if your blood sugar goes below 70, or if you have a lot of readings over 200.   Please stop taking the glimepiride, and:  take the tresiba no matter what you blood sugar is, and:  Please continue the same other diabetes medications.  Please come back for a follow-up appointment in 2 months.

## 2017-07-02 NOTE — Progress Notes (Signed)
Subjective:    Patient ID: [name], female    DOB: January 21, 1961, 56 y.o.   MRN: [id number]  HPI Pt returns for f/u of diabetes mellitus:  DM type: Insulin-requiring type 2.  Dx'ed: 2017, when she had a cbg of 400 on steroids.  Complications: none.  Therapy: insulin and 3 oral meds.   GDM: never.   DKA: never.  Severe hypoglycemia: never.   Pancreatitis: never.    Other: she is on qd insulin, at least for now.  Interval history: no cbg record, but states cbg's vary from 79-200.  pt states she feels well in general.   She skips the insulin if cbg is below 180 at HS, which happens twice per week.  Past Medical History:  Diagnosis Date  . Anemia   . Arthritis    bilateral knees  . Bronchitis    hx of  . Fibroid tumor    Had partial hysterectomy  . Gallstones   . Migraine    hx of    Past Surgical History:  Procedure Laterality Date  . ABDOMINAL HYSTERECTOMY     partial   . boil removed     . CARPAL TUNNEL RELEASE  04-15-15  . CHOLECYSTECTOMY  Oct 2012  . ELBOW SURGERY  04-15-15  . HAND SURGERY  04-15-15  . Nerve damage  04-15-15  . TOTAL KNEE ARTHROPLASTY  08/01/2012   Procedure: TOTAL KNEE ARTHROPLASTY;  Surgeon: Augustin Schooling, MD;  Location: [facility];  Service: Orthopedics;  Laterality: Right;  RIGHT TOTAL KNEE ARTHROPLASTY  . TRIGGER FINGER RELEASE  04-15-15  . TUBAL LIGATION    . WISDOM TOOTH EXTRACTION      Social History   Social History  . Marital status: Single    Spouse name: N/A  . Number of children: N/A  . Years of education: N/A   Occupational History  . Not on file.   Social History Main Topics  . Smoking status: Former Smoker    Packs/day: 0.25    Years: 20.00    Types: Cigarettes    Quit date: 12/09/2009  . Smokeless tobacco: Never Used  . Alcohol use 0.0 oz/week     Comment: social  . Drug use: No  . Sexual activity: Not Currently   Other Topics Concern  . Not on file   Social History Narrative  . No narrative on file    Current  Outpatient Prescriptions on File Prior to Visit  Medication Sig Dispense Refill  . atorvastatin (LIPITOR) 10 MG tablet TAKE 1 TABLET BY MOUTH EVERY DAY 30 tablet 1  . BAYER CONTOUR TEST test strip USE TWICE DAILY AS INSTRUCTED TO CHECK SUGARS. DX E. 11.9 100 each 5  . BD PEN NEEDLE NANO U/F 32G X 4 MM MISC USE DAILY TO ADMINISTER INSULIN AS DIRECTED  2  . Blood Glucose Monitoring Suppl (CONTOUR USB BLOOD GLUCOSE SYS) w/Device KIT Use daily as directed. 1 kit 0  . Cinnamon 500 MG capsule Take 1,000 mg by mouth 2 (two) times daily.    . cyclobenzaprine (FLEXERIL) 10 MG tablet TAKE 1 TABLET BY MOUTH AT BEDTIME 30 tablet 1  . Echinacea-Goldenseal (ECHINACEA COMB/GOLDEN SEAL PO) Take by mouth.    . fluticasone (FLONASE) 50 MCG/ACT nasal spray Place 2 sprays into both nostrils daily. 16 g 2  . gabapentin (NEURONTIN) 300 MG capsule Take 1 capsule (300 mg total) by mouth 3 (three) times daily. 270 capsule 1  . GLUCOSAMINE PO Take 1 tablet  by mouth 2 (two) times daily.    Marland Kitchen ibuprofen (ADVIL,MOTRIN) 200 MG tablet Take 200 mg by mouth every 6 (six) hours as needed. Reported on 02/13/2016    . loratadine (CLARITIN) 10 MG tablet TAKE 1 TABLET BY MOUTH EVERY DAY 30 tablet 2  . losartan-hydrochlorothiazide (HYZAAR) 50-12.5 MG tablet Take 1 tablet by mouth daily. 90 tablet 1  . meloxicam (MOBIC) 7.5 MG tablet Take 1 tablet (7.5 mg total) by mouth daily. 30 tablet 0  . metFORMIN (GLUCOPHAGE) 500 MG tablet TAKE 1 TABLET (500 MG TOTAL) BY MOUTH 2 (TWO) TIMES DAILY WITH A MEAL. 180 tablet 3  . Misc Natural Products (HERBAL ENERGY COMPLEX PO) Take by mouth 2 (two) times daily with a meal.    . Multiple Vitamins-Minerals (WOMENS MULTIVITAMIN PO) Take by mouth daily.    . Omega-3 1000 MG CAPS Take 1 g by mouth daily.    [name] DELICA LANCETS FINE MISC Use as directed.  Dx:E11.9 100 each 12  . ONGLYZA 5 MG TABS tablet TAKE 1 TABLET (5 MG TOTAL) BY MOUTH DAILY. 30 tablet 11  . TRESIBA FLEXTOUCH 200 UNIT/ML SOPN  Inject 60 Units into the skin daily. 6 pen 11  . triamcinolone cream (KENALOG) 0.1 % Apply 1 application topically 2 (two) times daily. 30 g 0  . vitamin B-12 (CYANOCOBALAMIN) 100 MCG tablet Take 100 mcg by mouth daily.    . vitamin C (ASCORBIC ACID) 500 MG tablet Take 1,000 mg by mouth daily.    . vitamin E 400 UNIT capsule Take 400 Units by mouth daily. Reported on 02/13/2016     No current facility-administered medications on file prior to visit.     Allergies  Allergen Reactions  . Penicillins Hives    Hives   . Tramadol Itching    Family History  Problem Relation Age of Onset  . Diabetes Father 30       Deceased  . Diabetes Mother        Living  . Hyperlipidemia Mother   . Tuberculosis Mother   . Asthma Mother   . Heart disease Maternal Uncle   . Colon cancer Maternal Uncle        dx in 67's  . Diabetes Brother   . Heart disease Maternal Aunt   . Arthritis Other        Maternal Aunts & Uncles  . Diabetes Sister   . Kidney failure Brother 30       Diseased  . Healthy Son        x1  . Breast cancer Cousin 22  . Esophageal cancer Neg Hx   . Rectal cancer Neg Hx   . Stomach cancer Neg Hx     BP (!) 150/102   Pulse 94   Wt 222 lb 12.8 oz (101.1 kg)   SpO2 97%   BMI 35.96 kg/m    Review of Systems She denies hypoglycemia.      Objective:   Physical Exam VITAL SIGNS:  See vs page GENERAL: no distress Pulses: foot pulses are intact bilaterally.   MSK: no deformity of the feet or ankles.  CV: no edema of the legs or ankles.  Skin:  no ulcer on the feet or ankles.  normal color and temp on the feet and ankles.  Neuro: sensation is intact to touch on the feet and ankles.    A1c=8.3%    Assessment & Plan:  Insulin-requiring type 2 DM. we discussed.  She needs to  be able to safely take insulin, no matter what cbg is.  HTN: recheck next time  Patient Instructions  check your blood sugar once a day.  vary the time of day when you check, between before the 3  meals, and at bedtime.  also check if you have symptoms of your blood sugar being too high or too low.  please keep a record of the readings and bring it to your next appointment here (or you can bring the meter itself).  You can write it on any piece of paper.  please call us sooner if your blood sugar goes below 70, or if you have a lot of readings over 200.   Please stop taking the glimepiride, and:  take the tresiba no matter what you blood sugar is, and:  Please continue the same other diabetes medications.  Please come back for a follow-up appointment in 2 months.

## 2017-07-04 LAB — POCT GLYCOSYLATED HEMOGLOBIN (HGB A1C): Hemoglobin A1C: 8.3

## 2017-07-18 ENCOUNTER — Telehealth: Payer: Self-pay | Admitting: Endocrinology

## 2017-07-18 NOTE — Telephone Encounter (Signed)
Jobe Gibbon from [employer] calling to confirm receiving a package from [place] yesterday 07/17/17 regarding patient. Stanton Kidney stated that Dominica signed for this. Please call Mary at 1 [phone] ex [phone] to confirm.

## 2017-07-19 NOTE — Telephone Encounter (Signed)
Routing to you °

## 2017-08-08 NOTE — Telephone Encounter (Signed)
It was a cd and paperwork, given to Dr. Loanne Drilling

## 2017-08-15 ENCOUNTER — Ambulatory Visit (INDEPENDENT_AMBULATORY_CARE_PROVIDER_SITE_OTHER): Payer: BLUE CROSS/BLUE SHIELD | Admitting: Physician Assistant

## 2017-08-15 ENCOUNTER — Encounter: Payer: Self-pay | Admitting: Physician Assistant

## 2017-08-15 VITALS — BP 132/80 | HR 101 | Temp 100.2°F | Resp 14 | Ht 66.0 in | Wt 222.0 lb

## 2017-08-15 DIAGNOSIS — J111 Influenza due to unidentified influenza virus with other respiratory manifestations: Secondary | ICD-10-CM

## 2017-08-15 LAB — POCT INFLUENZA A/B
INFLUENZA B, POC: POSITIVE — AB
Influenza A, POC: NEGATIVE

## 2017-08-15 LAB — POCT RAPID STREP A (OFFICE): Rapid Strep A Screen: NEGATIVE

## 2017-08-15 MED ORDER — OSELTAMIVIR PHOSPHATE 75 MG PO CAPS: 75.0000 mg | ORAL_CAPSULE | Freq: Two times a day (BID) | ORAL | 0 refills | Status: DC

## 2017-08-15 NOTE — Progress Notes (Signed)
Pre visit review using our clinic review tool, if applicable. No additional management support is needed unless otherwise documented below in the visit note. 

## 2017-08-15 NOTE — Progress Notes (Signed)
Patient presents to clinic today c/o 1 day of body aches, fatigue, sore throat and chills starting suddenly around 4 PM yesterday. Endorses fever at home with Tmax at 101. Denies sinus pressure, sinus pain, change in hearing, chest pain, SOB. Has noted increase in BP and heart rate since this started. Denies abdominal pain, N/V/D or change to bowel or bladder habits. Denies recent travel or sick contact. Has not taken anything for her symptoms.  Past Medical History:  Diagnosis Date  . Anemia   . Arthritis    bilateral knees  . Bronchitis    hx of  . Fibroid tumor    Had partial hysterectomy  . Gallstones   . Migraine    hx of    Current Outpatient Prescriptions on File Prior to Visit  Medication Sig Dispense Refill  . atorvastatin (LIPITOR) 10 MG tablet TAKE 1 TABLET BY MOUTH EVERY DAY 30 tablet 1  . BAYER CONTOUR TEST test strip USE TWICE DAILY AS INSTRUCTED TO CHECK SUGARS. DX E. 11.9 100 each 5  . BD PEN NEEDLE NANO U/F 32G X 4 MM MISC USE DAILY TO ADMINISTER INSULIN AS DIRECTED  2  . Blood Glucose Monitoring Suppl (CONTOUR USB BLOOD GLUCOSE SYS) w/Device KIT Use daily as directed. 1 kit 0  . Cinnamon 500 MG capsule Take 1,000 mg by mouth 2 (two) times daily.    . cyclobenzaprine (FLEXERIL) 10 MG tablet TAKE 1 TABLET BY MOUTH AT BEDTIME 30 tablet 1  . Echinacea-Goldenseal (ECHINACEA COMB/GOLDEN SEAL PO) Take by mouth.    . fluticasone (FLONASE) 50 MCG/ACT nasal spray Place 2 sprays into both nostrils daily. 16 g 2  . gabapentin (NEURONTIN) 300 MG capsule Take 1 capsule (300 mg total) by mouth 3 (three) times daily. 270 capsule 1  . GLUCOSAMINE PO Take 1 tablet by mouth 2 (two) times daily.    Marland Kitchen ibuprofen (ADVIL,MOTRIN) 200 MG tablet Take 200 mg by mouth every 6 (six) hours as needed. Reported on 02/13/2016    . loratadine (CLARITIN) 10 MG tablet TAKE 1 TABLET BY MOUTH EVERY DAY 30 tablet 2  . losartan-hydrochlorothiazide (HYZAAR) 50-12.5 MG tablet Take 1 tablet by mouth daily. 90  tablet 1  . meloxicam (MOBIC) 7.5 MG tablet Take 1 tablet (7.5 mg total) by mouth daily. 30 tablet 0  . metFORMIN (GLUCOPHAGE) 500 MG tablet TAKE 1 TABLET (500 MG TOTAL) BY MOUTH 2 (TWO) TIMES DAILY WITH A MEAL. 180 tablet 3  . Misc Natural Products (HERBAL ENERGY COMPLEX PO) Take by mouth 2 (two) times daily with a meal.    . Multiple Vitamins-Minerals (WOMENS MULTIVITAMIN PO) Take by mouth daily.    . Omega-3 1000 MG CAPS Take 1 g by mouth daily.    [name] DELICA LANCETS FINE MISC Use as directed.  Dx:E11.9 100 each 12  . ONGLYZA 5 MG TABS tablet TAKE 1 TABLET (5 MG TOTAL) BY MOUTH DAILY. 30 tablet 11  . TRESIBA FLEXTOUCH 200 UNIT/ML SOPN Inject 60 Units into the skin daily. 6 pen 11  . triamcinolone cream (KENALOG) 0.1 % Apply 1 application topically 2 (two) times daily. 30 g 0  . vitamin B-12 (CYANOCOBALAMIN) 100 MCG tablet Take 100 mcg by mouth daily.    . vitamin C (ASCORBIC ACID) 500 MG tablet Take 1,000 mg by mouth daily.    . vitamin E 400 UNIT capsule Take 400 Units by mouth daily. Reported on 02/13/2016     No current facility-administered medications on file prior  to visit.     Allergies  Allergen Reactions  . Penicillins Hives    Hives   . Tramadol Itching    Family History  Problem Relation Age of Onset  . Diabetes Father 21       Deceased  . Diabetes Mother        Living  . Hyperlipidemia Mother   . Tuberculosis Mother   . Asthma Mother   . Heart disease Maternal Uncle   . Colon cancer Maternal Uncle        dx in 58's  . Diabetes Brother   . Heart disease Maternal Aunt   . Arthritis Other        Maternal Aunts & Uncles  . Diabetes Sister   . Kidney failure Brother 30       Diseased  . Healthy Son        x1  . Breast cancer Cousin 77  . Esophageal cancer Neg Hx   . Rectal cancer Neg Hx   . Stomach cancer Neg Hx     Social History   Social History  . Marital status: Single    Spouse name: N/A  . Number of children: N/A  . Years of education: N/A    Social History Main Topics  . Smoking status: Former Smoker    Packs/day: 0.25    Years: 20.00    Types: Cigarettes    Quit date: 12/09/2009  . Smokeless tobacco: Never Used  . Alcohol use 0.0 oz/week     Comment: social  . Drug use: No  . Sexual activity: Not Currently   Other Topics Concern  . None   Social History Narrative  . None   Review of Systems - See HPI.  All other ROS are negative.  BP 132/80   Pulse (!) 101   Temp 100.2 F (37.9 C) (Oral)   Resp 14   Ht '5\' 6"'  (1.676 m)   Wt 222 lb (100.7 kg)   SpO2 98%   BMI 35.83 kg/m   Physical Exam  Constitutional: She is oriented to person, place, and time and well-developed, well-nourished, and in no distress.  HENT:  Head: Normocephalic and atraumatic.  Right Ear: Tympanic membrane normal.  Left Ear: Tympanic membrane normal.  Nose: Mucosal edema and rhinorrhea present.  Mouth/Throat: Uvula is midline, oropharynx is clear and moist and mucous membranes are normal.  Eyes: Conjunctivae are normal.  Neck: Neck supple.  Cardiovascular: Normal rate, regular rhythm, normal heart sounds and intact distal pulses.   Pulmonary/Chest: Effort normal and breath sounds normal. No respiratory distress. She has no wheezes. She has no rales. She exhibits no tenderness.  Lymphadenopathy:    She has no cervical adenopathy.  Neurological: She is alert and oriented to person, place, and time.  Skin: Skin is warm and dry. No rash noted.  Vitals reviewed.   Recent Results (from the past 2160 hour(s))  POCT HgB A1C     Status: None   Collection Time: 07/04/17  1:54 PM  Result Value Ref Range   Hemoglobin A1C 8.3    Assessment/Plan: 1. Influenza 1 day of symptoms. Will start Tamilfu giving comorbid conditions. Take as directed. Increase fluid intake. Continue allergy medications. Tylenol as directed.  - oseltamivir (TAMIFLU) 75 MG capsule; Take 1 capsule (75 mg total) by mouth 2 (two) times daily.  Dispense: 10 capsule;  Refill: 0 - POCT Influenza A/B - POCT rapid strep A   Leeanne Rio, PA-C

## 2017-08-15 NOTE — Patient Instructions (Signed)
Please stay well hydrated. Get plenty of rest. Take the Tamiflu as directed.  Continue allergy medications.  Add on a tylenol if needed for fever or aches. Use some saline nasal rinse to flush out nasal passages.    Influenza, Adult Influenza ("the flu") is an infection in the lungs, nose, and throat (respiratory tract). It is caused by a virus. The flu causes many common cold symptoms, as well as a high fever and body aches. It can make you feel very sick. The flu spreads easily from person to person (is contagious). Getting a flu shot (influenza vaccination) every year is the best way to prevent the flu. Follow these instructions at home:  Take over-the-counter and prescription medicines only as told by your doctor.  Use a cool mist humidifier to add moisture (humidity) to the air in your home. This can make it easier to breathe.  Rest as needed.  Drink enough fluid to keep your pee (urine) clear or pale yellow.  Cover your mouth and nose when you cough or sneeze.  Wash your hands with soap and water often, especially after you cough or sneeze. If you cannot use soap and water, use hand sanitizer.  Stay home from work or school as told by your doctor. Unless you are visiting your doctor, try to avoid leaving home until your fever has been gone for 24 hours without the use of medicine.  Keep all follow-up visits as told by your doctor. This is important. How is this prevented?  Getting a yearly (annual) flu shot is the best way to avoid getting the flu. You may get the flu shot in late summer, fall, or winter. Ask your doctor when you should get your flu shot.  Wash your hands often or use hand sanitizer often.  Avoid contact with people who are sick during cold and flu season.  Eat healthy foods.  Drink plenty of fluids.  Get enough sleep.  Exercise regularly. Contact a doctor if:  You get new symptoms.  You have: ? Chest pain. ? Watery poop (diarrhea). ? A  fever.  Your cough gets worse.  You start to have more mucus.  You feel sick to your stomach (nauseous).  You throw up (vomit). Get help right away if:  You start to be short of breath or have trouble breathing.  Your skin or nails turn a bluish color.  You have very bad pain or stiffness in your neck.  You get a sudden headache.  You get sudden pain in your face or ear.  You cannot stop throwing up. This information is not intended to replace advice given to you by your health care provider. Make sure you discuss any questions you have with your health care provider. Document Released: 09/04/2008 Document Revised: 05/03/2016 Document Reviewed: 09/20/2015 Elsevier Interactive Patient Education  2017 [employer].

## 2017-08-16 ENCOUNTER — Other Ambulatory Visit: Payer: Self-pay | Admitting: Physician Assistant

## 2017-08-16 ENCOUNTER — Other Ambulatory Visit: Payer: Self-pay | Admitting: Medical

## 2017-08-16 DIAGNOSIS — E785 Hyperlipidemia, unspecified: Secondary | ICD-10-CM

## 2017-08-19 ENCOUNTER — Telehealth: Payer: Self-pay | Admitting: *Deleted

## 2017-08-19 MED ORDER — BENZONATATE 100 MG PO CAPS: 100.0000 mg | ORAL_CAPSULE | Freq: Two times a day (BID) | ORAL | 0 refills | Status: DC | PRN

## 2017-08-19 NOTE — Telephone Encounter (Signed)
Rx Tessalon sent to pharmacy for patient to take as directed for cough.

## 2017-08-19 NOTE — Telephone Encounter (Signed)
Patient was in last week and diagnosed with the flu.  She is still not over it, but now she has a really bad cough and she's starting to lose her voice. She is asking if there is anything that can be called in for her cough.  If so, she would like this to go to the [facility] on [address].   Routed to provider to advise.

## 2017-08-19 NOTE — Telephone Encounter (Signed)
LM for patient letting her know.

## 2017-08-28 ENCOUNTER — Ambulatory Visit: Payer: BLUE CROSS/BLUE SHIELD | Admitting: Endocrinology

## 2017-09-02 ENCOUNTER — Telehealth: Payer: Self-pay | Admitting: Physician Assistant

## 2017-09-02 NOTE — Telephone Encounter (Signed)
I do not complete Long-term disability forms. Will give to Engineer, building services. to reach out to the requestor. LTD would have to come from her treating specialists.

## 2017-09-02 NOTE — Telephone Encounter (Signed)
Received disability paperwork by fax, placed with charge sheet in bin up front.

## 2017-09-04 ENCOUNTER — Encounter: Payer: Self-pay | Admitting: Endocrinology

## 2017-09-04 ENCOUNTER — Ambulatory Visit (INDEPENDENT_AMBULATORY_CARE_PROVIDER_SITE_OTHER): Payer: BLUE CROSS/BLUE SHIELD | Admitting: Endocrinology

## 2017-09-04 VITALS — BP 112/72 | HR 102 | Wt 222.4 lb

## 2017-09-04 DIAGNOSIS — E119 Type 2 diabetes mellitus without complications: Secondary | ICD-10-CM

## 2017-09-04 DIAGNOSIS — R002 Palpitations: Secondary | ICD-10-CM

## 2017-09-04 LAB — POCT GLYCOSYLATED HEMOGLOBIN (HGB A1C): Hemoglobin A1C: 8.2

## 2017-09-04 NOTE — Progress Notes (Signed)
Subjective:    Patient ID: [name], female    DOB: [birth year], 56 y.o.   MRN: [id number]  HPI Pt returns for f/u of diabetes mellitus:  DM type: Insulin-requiring type 2.  Dx'ed: 2017, when she had a cbg of 400 on steroids.  Complications: none.  Therapy: insulin and 2 oral meds.   GDM: never.   DKA: never.  Severe hypoglycemia: never.   Pancreatitis: never.    Other: she is on qd insulin, at least for now.  Interval history: Pt reports palpitations x 2 weeks.  She feels this is due to a recent URI.  She still takes glimepiride.  She skips tresiba 3-4 times per week, due to fasting cbg being approx 90.  no cbg record, but states cbg's vary from 70-100's.   Past Medical History:  Diagnosis Date  . Anemia   . Arthritis    bilateral knees  . Bronchitis    hx of  . Fibroid tumor    Had partial hysterectomy  . Gallstones   . Migraine    hx of    Past Surgical History:  Procedure Laterality Date  . ABDOMINAL HYSTERECTOMY     partial   . boil removed     . CARPAL TUNNEL RELEASE  04-15-15  . CHOLECYSTECTOMY  Oct 2012  . ELBOW SURGERY  04-15-15  . HAND SURGERY  04-15-15  . Nerve damage  04-15-15  . TOTAL KNEE ARTHROPLASTY  08/01/2012   Procedure: TOTAL KNEE ARTHROPLASTY;  Surgeon: Augustin Schooling, MD;  Location: [place];  Service: Orthopedics;  Laterality: Right;  RIGHT TOTAL KNEE ARTHROPLASTY  . TRIGGER FINGER RELEASE  04-15-15  . TUBAL LIGATION    . WISDOM TOOTH EXTRACTION      Social History   Social History  . Marital status: Single    Spouse name: N/A  . Number of children: N/A  . Years of education: N/A   Occupational History  . Not on file.   Social History Main Topics  . Smoking status: Former Smoker    Packs/day: 0.25    Years: 20.00    Types: Cigarettes    Quit date: 12/09/2009  . Smokeless tobacco: Never Used  . Alcohol use 0.0 oz/week     Comment: social  . Drug use: No  . Sexual activity: Not Currently   Other Topics Concern  . Not on file    Social History Narrative  . No narrative on file    Current Outpatient Prescriptions on File Prior to Visit  Medication Sig Dispense Refill  . atorvastatin (LIPITOR) 10 MG tablet TAKE 1 TABLET BY MOUTH EVERY DAY 90 tablet 1  . BAYER CONTOUR TEST test strip USE TWICE DAILY AS INSTRUCTED TO CHECK SUGARS. DX E. 11.9 100 each 5  . BD PEN NEEDLE NANO U/F 32G X 4 MM MISC USE DAILY TO ADMINISTER INSULIN AS DIRECTED  2  . Blood Glucose Monitoring Suppl (CONTOUR USB BLOOD GLUCOSE SYS) w/Device KIT Use daily as directed. 1 kit 0  . Cinnamon 500 MG capsule Take 1,000 mg by mouth 2 (two) times daily.    . cyclobenzaprine (FLEXERIL) 10 MG tablet TAKE 1 TABLET BY MOUTH AT BEDTIME 30 tablet 1  . Echinacea-Goldenseal (ECHINACEA COMB/GOLDEN SEAL PO) Take by mouth.    . fluticasone (FLONASE) 50 MCG/ACT nasal spray Place 2 sprays into both nostrils daily. 16 g 2  . gabapentin (NEURONTIN) 300 MG capsule Take 1 capsule (300 mg total) by mouth 3 (three)  times daily. 270 capsule 1  . GLUCOSAMINE PO Take 1 tablet by mouth 2 (two) times daily.    Marland Kitchen ibuprofen (ADVIL,MOTRIN) 200 MG tablet Take 200 mg by mouth every 6 (six) hours as needed. Reported on 02/13/2016    . loratadine (CLARITIN) 10 MG tablet TAKE 1 TABLET BY MOUTH EVERY DAY 30 tablet 2  . losartan-hydrochlorothiazide (HYZAAR) 50-12.5 MG tablet Take 1 tablet by mouth daily. 90 tablet 1  . meloxicam (MOBIC) 7.5 MG tablet Take 1 tablet (7.5 mg total) by mouth daily. 30 tablet 0  . metFORMIN (GLUCOPHAGE) 500 MG tablet TAKE 1 TABLET (500 MG TOTAL) BY MOUTH 2 (TWO) TIMES DAILY WITH A MEAL. 180 tablet 3  . Misc Natural Products (HERBAL ENERGY COMPLEX PO) Take by mouth 2 (two) times daily with a meal.    . Multiple Vitamins-Minerals (WOMENS MULTIVITAMIN PO) Take by mouth daily.    . Omega-3 1000 MG CAPS Take 1 g by mouth daily.    [name] DELICA LANCETS FINE MISC Use as directed.  Dx:E11.9 100 each 12  . ONGLYZA 5 MG TABS tablet TAKE 1 TABLET (5 MG TOTAL) BY  MOUTH DAILY. 30 tablet 11  . TRESIBA FLEXTOUCH 200 UNIT/ML SOPN Inject 60 Units into the skin daily. 6 pen 11  . triamcinolone cream (KENALOG) 0.1 % Apply 1 application topically 2 (two) times daily. 30 g 0  . vitamin B-12 (CYANOCOBALAMIN) 100 MCG tablet Take 100 mcg by mouth daily.    . vitamin C (ASCORBIC ACID) 500 MG tablet Take 1,000 mg by mouth daily.    . vitamin E 400 UNIT capsule Take 400 Units by mouth daily. Reported on 02/13/2016     No current facility-administered medications on file prior to visit.     Allergies  Allergen Reactions  . Penicillins Hives    Hives   . Tramadol Itching    Family History  Problem Relation Age of Onset  . Diabetes Father 97       Deceased  . Diabetes Mother        Living  . Hyperlipidemia Mother   . Tuberculosis Mother   . Asthma Mother   . Heart disease Maternal Uncle   . Colon cancer Maternal Uncle        dx in 67's  . Diabetes Brother   . Heart disease Maternal Aunt   . Arthritis Other        Maternal Aunts & Uncles  . Diabetes Sister   . Kidney failure Brother 30       Diseased  . Healthy Son        x1  . Breast cancer Cousin 20  . Esophageal cancer Neg Hx   . Rectal cancer Neg Hx   . Stomach cancer Neg Hx     BP 112/72   Pulse (!) 102   Wt 222 lb 6.4 oz (100.9 kg)   SpO2 95%   BMI 35.90 kg/m    Review of Systems She denies hypoglycemia.       Objective:   Physical Exam VITAL SIGNS:  See vs page GENERAL: no distress Pulses: foot pulses are intact bilaterally.   MSK: no deformity of the feet or ankles.  CV: no edema of the legs or ankles Skin:  no ulcer on the feet or ankles.  normal color and temp on the feet and ankles Neuro: sensation is intact to touch on the feet and ankles.     Lab Results  Component Value Date  HGBA1C 8.2 09/04/2017       Assessment & Plan:  Insulin-requiring type 2 DM: she needs to simplify insulin regimen.  Patient Instructions  check your blood sugar once a day.  vary the  time of day when you check, between before the 3 meals, and at bedtime.  also check if you have symptoms of your blood sugar being too high or too low.  please keep a record of the readings and bring it to your next appointment here (or you can bring the meter itself).  You can write it on any piece of paper.  please call us sooner if your blood sugar goes below 70, or if you have a lot of readings over 200.   Please stop taking the glimepiride, and:  Try taking the tresiba no matter what you blood sugar is, and:  Please continue the same other diabetes medications.  Please come back for a follow-up appointment in 2 months.

## 2017-09-04 NOTE — Patient Instructions (Addendum)
check your blood sugar once a day.  vary the time of day when you check, between before the 3 meals, and at bedtime.  also check if you have symptoms of your blood sugar being too high or too low.  please keep a record of the readings and bring it to your next appointment here (or you can bring the meter itself).  You can write it on any piece of paper.  please call us sooner if your blood sugar goes below 70, or if you have a lot of readings over 200.   Please stop taking the glimepiride, and:  Try taking the tresiba no matter what you blood sugar is, and:  Please continue the same other diabetes medications.  Please come back for a follow-up appointment in 2 months.

## 2017-09-05 LAB — TSH: TSH: 1.1 u[IU]/mL (ref 0.35–4.50)

## 2017-09-09 ENCOUNTER — Encounter: Payer: Self-pay | Admitting: Physician Assistant

## 2017-09-09 ENCOUNTER — Ambulatory Visit (INDEPENDENT_AMBULATORY_CARE_PROVIDER_SITE_OTHER): Payer: BLUE CROSS/BLUE SHIELD | Admitting: Physician Assistant

## 2017-09-09 VITALS — BP 120/74 | HR 93 | Temp 98.5°F | Resp 14 | Ht 66.0 in | Wt 223.0 lb

## 2017-09-09 DIAGNOSIS — J302 Other seasonal allergic rhinitis: Secondary | ICD-10-CM | POA: Diagnosis not present

## 2017-09-09 DIAGNOSIS — R000 Tachycardia, unspecified: Secondary | ICD-10-CM | POA: Diagnosis not present

## 2017-09-09 LAB — CBC WITH DIFFERENTIAL/PLATELET
BASOS ABS: 0 10*3/uL (ref 0.0–0.1)
BASOS PCT: 0.4 % (ref 0.0–3.0)
EOS ABS: 0.1 10*3/uL (ref 0.0–0.7)
Eosinophils Relative: 1.3 % (ref 0.0–5.0)
HEMATOCRIT: 35.9 % — AB (ref 36.0–46.0)
HEMOGLOBIN: 11.6 g/dL — AB (ref 12.0–15.0)
Lymphocytes Relative: 37.3 % (ref 12.0–46.0)
Lymphs Abs: 1.9 10*3/uL (ref 0.7–4.0)
MCHC: 32.3 g/dL (ref 30.0–36.0)
MCV: 89.4 fl (ref 78.0–100.0)
MONO ABS: 0.4 10*3/uL (ref 0.1–1.0)
MONOS PCT: 8.7 % (ref 3.0–12.0)
NEUTROS ABS: 2.6 10*3/uL (ref 1.4–7.7)
Neutrophils Relative %: 52.3 % (ref 43.0–77.0)
PLATELETS: 350 10*3/uL (ref 150.0–400.0)
RBC: 4.01 Mil/uL (ref 3.87–5.11)
RDW: 14.3 % (ref 11.5–15.5)
WBC: 5 10*3/uL (ref 4.0–10.5)

## 2017-09-09 MED ORDER — LEVOCETIRIZINE DIHYDROCHLORIDE 5 MG PO TABS: 5.0000 mg | ORAL_TABLET | Freq: Every evening | ORAL | 1 refills | Status: DC

## 2017-09-09 MED ORDER — AZELASTINE HCL 0.1 % NA SOLN: 2.0000 | Freq: Two times a day (BID) | NASAL | 12 refills | Status: DC

## 2017-09-09 NOTE — Patient Instructions (Addendum)
Please go to the lab for blood work. I will call you with your results. Increase fluids and get plenty of rest.   Start the Xyzal I have sent in for you. You will do this instead of the Zyrtec. Continue Flonase and start the Astelin I have sent in.  Please keep a close eye on BP and heart rate over the next few days. Check 2 x daily and write down. If staying elevated and labs normal we will need further assessment.

## 2017-09-09 NOTE — Progress Notes (Signed)
Pre visit review using our clinic review tool, if applicable. No additional management support is needed unless otherwise documented below in the visit note. 

## 2017-09-09 NOTE — Progress Notes (Signed)
Patient presents to clinic today c/o 1 week of fatigue, dry cough, elevated pulse (90-112) and slightly elevated BP. Patient recently with the flu about 3.5 weeks ago. States that symptoms completely resolved before recurrence. Has felt more somnolence. Denies chest congestion but cough is worse throughout the day. And better in the morning. Denies nasal congestion, fever, chills, headaches or dizziness. Has noted clear drainage from the eyelids. Was seen by Endocrinologist last week and had negative TSH assessment. Denies palpitations, chest pain, LH/DZ. Denies abdominal pain, myalgias or change to bowel/bladder habits.   Past Medical History:  Diagnosis Date  . Anemia   . Arthritis    bilateral knees  . Bronchitis    hx of  . Fibroid tumor    Had partial hysterectomy  . Gallstones   . Migraine    hx of    Current Outpatient Prescriptions on File Prior to Visit  Medication Sig Dispense Refill  . atorvastatin (LIPITOR) 10 MG tablet TAKE 1 TABLET BY MOUTH EVERY DAY 90 tablet 1  . BAYER CONTOUR TEST test strip USE TWICE DAILY AS INSTRUCTED TO CHECK SUGARS. DX E. 11.9 100 each 5  . BD PEN NEEDLE NANO U/F 32G X 4 MM MISC USE DAILY TO ADMINISTER INSULIN AS DIRECTED  2  . Blood Glucose Monitoring Suppl (CONTOUR USB BLOOD GLUCOSE SYS) w/Device KIT Use daily as directed. 1 kit 0  . Cinnamon 500 MG capsule Take 1,000 mg by mouth 2 (two) times daily.    . cyclobenzaprine (FLEXERIL) 10 MG tablet TAKE 1 TABLET BY MOUTH AT BEDTIME 30 tablet 1  . Echinacea-Goldenseal (ECHINACEA COMB/GOLDEN SEAL PO) Take by mouth.    . fluticasone (FLONASE) 50 MCG/ACT nasal spray Place 2 sprays into both nostrils daily. 16 g 2  . gabapentin (NEURONTIN) 300 MG capsule Take 1 capsule (300 mg total) by mouth 3 (three) times daily. 270 capsule 1  . GLUCOSAMINE PO Take 1 tablet by mouth 2 (two) times daily.    Marland Kitchen ibuprofen (ADVIL,MOTRIN) 200 MG tablet Take 200 mg by mouth every 6 (six) hours as needed. Reported on  02/13/2016    . loratadine (CLARITIN) 10 MG tablet TAKE 1 TABLET BY MOUTH EVERY DAY 30 tablet 2  . losartan-hydrochlorothiazide (HYZAAR) 50-12.5 MG tablet Take 1 tablet by mouth daily. 90 tablet 1  . meloxicam (MOBIC) 7.5 MG tablet Take 1 tablet (7.5 mg total) by mouth daily. 30 tablet 0  . metFORMIN (GLUCOPHAGE) 500 MG tablet TAKE 1 TABLET (500 MG TOTAL) BY MOUTH 2 (TWO) TIMES DAILY WITH A MEAL. 180 tablet 3  . Misc Natural Products (HERBAL ENERGY COMPLEX PO) Take by mouth 2 (two) times daily with a meal.    . Multiple Vitamins-Minerals (WOMENS MULTIVITAMIN PO) Take by mouth daily.    . Omega-3 1000 MG CAPS Take 1 g by mouth daily.    [name] DELICA LANCETS FINE MISC Use as directed.  Dx:E11.9 100 each 12  . ONGLYZA 5 MG TABS tablet TAKE 1 TABLET (5 MG TOTAL) BY MOUTH DAILY. 30 tablet 11  . TRESIBA FLEXTOUCH 200 UNIT/ML SOPN Inject 60 Units into the skin daily. 6 pen 11  . triamcinolone cream (KENALOG) 0.1 % Apply 1 application topically 2 (two) times daily. 30 g 0  . vitamin B-12 (CYANOCOBALAMIN) 100 MCG tablet Take 100 mcg by mouth daily.    . vitamin C (ASCORBIC ACID) 500 MG tablet Take 1,000 mg by mouth daily.    . vitamin E 400 UNIT capsule Take  400 Units by mouth daily. Reported on 02/13/2016     No current facility-administered medications on file prior to visit.     Allergies  Allergen Reactions  . Penicillins Hives    Hives   . Tramadol Itching    Family History  Problem Relation Age of Onset  . Diabetes Father 75       Deceased  . Diabetes Mother        Living  . Hyperlipidemia Mother   . Tuberculosis Mother   . Asthma Mother   . Heart disease Maternal Uncle   . Colon cancer Maternal Uncle        dx in 30's  . Diabetes Brother   . Heart disease Maternal Aunt   . Arthritis Other        Maternal Aunts & Uncles  . Diabetes Sister   . Kidney failure Brother 30       Diseased  . Healthy Son        x1  . Breast cancer Cousin 34  . Esophageal cancer Neg Hx   .  Rectal cancer Neg Hx   . Stomach cancer Neg Hx     Social History   Social History  . Marital status: Single    Spouse name: N/A  . Number of children: N/A  . Years of education: N/A   Social History Main Topics  . Smoking status: Former Smoker    Packs/day: 0.25    Years: 20.00    Types: Cigarettes    Quit date: 12/09/2009  . Smokeless tobacco: Never Used  . Alcohol use 0.0 oz/week     Comment: social  . Drug use: No  . Sexual activity: Not Currently   Other Topics Concern  . None   Social History Narrative  . None   Review of Systems - See HPI.  All other ROS are negative.  BP 120/74   Pulse 93   Temp 98.5 F (36.9 C) (Oral)   Resp 14   Ht '5\' 6"'  (1.676 m)   Wt 223 lb (101.2 kg)   SpO2 97%   BMI 35.99 kg/m   Physical Exam  Constitutional: She is oriented to person, place, and time and well-developed, well-nourished, and in no distress.  HENT:  Head: Normocephalic and atraumatic.  Right Ear: Tympanic membrane is bulging. No middle ear effusion.  Left Ear: Tympanic membrane is bulging.  No middle ear effusion.  Nose: Mucosal edema and rhinorrhea present. Right sinus exhibits no maxillary sinus tenderness and no frontal sinus tenderness. Left sinus exhibits no maxillary sinus tenderness and no frontal sinus tenderness.  Mouth/Throat: Uvula is midline, oropharynx is clear and moist and mucous membranes are normal.  Eyes: Pupils are equal, round, and reactive to light. Conjunctivae are normal.  Neck: Neck supple. No thyromegaly present.  Cardiovascular: Regular rhythm and normal heart sounds.  Tachycardia present.   Pulmonary/Chest: Effort normal and breath sounds normal. No respiratory distress. She has no wheezes. She has no rales. She exhibits no tenderness.  Lymphadenopathy:    She has no cervical adenopathy.  Neurological: She is alert and oriented to person, place, and time.  Skin: Skin is warm and dry. No rash noted.  Psychiatric: Affect normal.  Vitals  reviewed.  Recent Results (from the past 2160 hour(s))  POCT HgB A1C     Status: None   Collection Time: 07/04/17  1:54 PM  Result Value Ref Range   Hemoglobin A1C 8.3   POCT Influenza A/B  Status: Abnormal   Collection Time: 08/15/17 10:48 AM  Result Value Ref Range   Influenza A, POC Negative Negative   Influenza B, POC Positive (A) Negative  POCT rapid strep A     Status: Normal   Collection Time: 08/15/17 10:54 AM  Result Value Ref Range   Rapid Strep A Screen Negative Negative  POCT HgB A1C     Status: None   Collection Time: 09/04/17  4:08 PM  Result Value Ref Range   Hemoglobin A1C 8.2   TSH     Status: None   Collection Time: 09/04/17  4:29 PM  Result Value Ref Range   TSH 1.10 0.35 - 4.50 uIU/mL    Assessment/Plan: 1. Seasonal allergic rhinitis, unspecified trigger Seems most likely culprit of recent symptoms, especially giving rhinorrhea, watery eyes and fatigue and that she stopped her allergy medications a couple of weeks ago. Start Xyzal and Astelin. Continue Flonase. Supportive measures reviewed. Close follow-up scheduled. - CBC w/Diff  2. Tachycardia Slight tachycardia on exam. Recheck pulse at 93. Increased from her baseline. Recent thyroid studies unremarkable. Can be related to recent viral illness and mild deconditioning. Will check CBC w diff today. Increase fluids. Avoid decongestants. She is to keep check on BP at home.  - CBC w/Diff   Leeanne Rio, PA-C

## 2017-09-10 ENCOUNTER — Other Ambulatory Visit: Payer: Self-pay | Admitting: Physician Assistant

## 2017-09-10 DIAGNOSIS — M62838 Other muscle spasm: Secondary | ICD-10-CM

## 2017-09-11 ENCOUNTER — Encounter: Payer: Self-pay | Admitting: Physician Assistant

## 2017-09-11 ENCOUNTER — Ambulatory Visit (INDEPENDENT_AMBULATORY_CARE_PROVIDER_SITE_OTHER): Payer: BLUE CROSS/BLUE SHIELD | Admitting: Physician Assistant

## 2017-09-11 VITALS — BP 120/78 | HR 89 | Temp 98.7°F | Resp 14 | Ht 66.0 in | Wt 222.0 lb

## 2017-09-11 DIAGNOSIS — R21 Rash and other nonspecific skin eruption: Secondary | ICD-10-CM

## 2017-09-11 NOTE — Progress Notes (Signed)
Patient presents to clinic today c/o recurrent bumps of forearms bilaterally, noting some are now in a circular pattern. Denies itch or pain. Is not alleviated with triamcinolone. Denies fever, chills. Has not tried anything else for symptoms.  Past Medical History:  Diagnosis Date  . Anemia   . Arthritis    bilateral knees  . Bronchitis    hx of  . Fibroid tumor    Had partial hysterectomy  . Gallstones   . Migraine    hx of    Current Outpatient Prescriptions on File Prior to Visit  Medication Sig Dispense Refill  . atorvastatin (LIPITOR) 10 MG tablet TAKE 1 TABLET BY MOUTH EVERY DAY 90 tablet 1  . azelastine (ASTELIN) 0.1 % nasal spray Place 2 sprays into both nostrils 2 (two) times daily. Use in each nostril as directed 30 mL 12  . BAYER CONTOUR TEST test strip USE TWICE DAILY AS INSTRUCTED TO CHECK SUGARS. DX E. 11.9 100 each 5  . BD PEN NEEDLE NANO U/F 32G X 4 MM MISC USE DAILY TO ADMINISTER INSULIN AS DIRECTED  2  . Blood Glucose Monitoring Suppl (CONTOUR USB BLOOD GLUCOSE SYS) w/Device KIT Use daily as directed. 1 kit 0  . Cinnamon 500 MG capsule Take 1,000 mg by mouth 2 (two) times daily.    . cyclobenzaprine (FLEXERIL) 10 MG tablet TAKE 1 TABLET BY MOUTH AT BEDTIME 30 tablet 1  . Echinacea-Goldenseal (ECHINACEA COMB/GOLDEN SEAL PO) Take by mouth.    . fluticasone (FLONASE) 50 MCG/ACT nasal spray Place 2 sprays into both nostrils daily. 16 g 2  . gabapentin (NEURONTIN) 300 MG capsule Take 1 capsule (300 mg total) by mouth 3 (three) times daily. 270 capsule 1  . GLUCOSAMINE PO Take 1 tablet by mouth 2 (two) times daily.    Marland Kitchen ibuprofen (ADVIL,MOTRIN) 200 MG tablet Take 200 mg by mouth every 6 (six) hours as needed. Reported on 02/13/2016    . levocetirizine (XYZAL) 5 MG tablet Take 1 tablet (5 mg total) by mouth every evening. 30 tablet 1  . loratadine (CLARITIN) 10 MG tablet TAKE 1 TABLET BY MOUTH EVERY DAY 30 tablet 2  . losartan-hydrochlorothiazide (HYZAAR) 50-12.5 MG  tablet Take 1 tablet by mouth daily. 90 tablet 1  . meloxicam (MOBIC) 7.5 MG tablet Take 1 tablet (7.5 mg total) by mouth daily. 30 tablet 0  . metFORMIN (GLUCOPHAGE) 500 MG tablet TAKE 1 TABLET (500 MG TOTAL) BY MOUTH 2 (TWO) TIMES DAILY WITH A MEAL. 180 tablet 3  . Misc Natural Products (HERBAL ENERGY COMPLEX PO) Take by mouth 2 (two) times daily with a meal.    . Multiple Vitamins-Minerals (WOMENS MULTIVITAMIN PO) Take by mouth daily.    . Omega-3 1000 MG CAPS Take 1 g by mouth daily.    [name] DELICA LANCETS FINE MISC Use as directed.  Dx:E11.9 100 each 12  . ONGLYZA 5 MG TABS tablet TAKE 1 TABLET (5 MG TOTAL) BY MOUTH DAILY. 30 tablet 11  . TRESIBA FLEXTOUCH 200 UNIT/ML SOPN Inject 60 Units into the skin daily. 6 pen 11  . triamcinolone cream (KENALOG) 0.1 % Apply 1 application topically 2 (two) times daily. 30 g 0  . vitamin B-12 (CYANOCOBALAMIN) 100 MCG tablet Take 100 mcg by mouth daily.    . vitamin C (ASCORBIC ACID) 500 MG tablet Take 1,000 mg by mouth daily.    . vitamin E 400 UNIT capsule Take 400 Units by mouth daily. Reported on 02/13/2016  No current facility-administered medications on file prior to visit.     Allergies  Allergen Reactions  . Penicillins Hives    Hives   . Tramadol Itching    Family History  Problem Relation Age of Onset  . Diabetes Father 96       Deceased  . Diabetes Mother        Living  . Hyperlipidemia Mother   . Tuberculosis Mother   . Asthma Mother   . Heart disease Maternal Uncle   . Colon cancer Maternal Uncle        dx in 41's  . Diabetes Brother   . Heart disease Maternal Aunt   . Arthritis Other        Maternal Aunts & Uncles  . Diabetes Sister   . Kidney failure Brother 30       Diseased  . Healthy Son        x1  . Breast cancer Cousin 72  . Esophageal cancer Neg Hx   . Rectal cancer Neg Hx   . Stomach cancer Neg Hx     Social History   Social History  . Marital status: Single    Spouse name: N/A  . Number of  children: N/A  . Years of education: N/A   Social History Main Topics  . Smoking status: Former Smoker    Packs/day: 0.25    Years: 20.00    Types: Cigarettes    Quit date: 12/09/2009  . Smokeless tobacco: Never Used  . Alcohol use 0.0 oz/week     Comment: social  . Drug use: No  . Sexual activity: Not Currently   Other Topics Concern  . None   Social History Narrative  . None    Review of Systems - See HPI.  All other ROS are negative.  BP 120/78   Pulse 89   Temp 98.7 F (37.1 C) (Oral)   Resp 14   Ht '5\' 6"'  (1.676 m)   Wt 222 lb (100.7 kg)   SpO2 98%   BMI 35.83 kg/m   Physical Exam  Constitutional: She is oriented to person, place, and time and well-developed, well-nourished, and in no distress.  HENT:  Head: Normocephalic and atraumatic.  Eyes: Conjunctivae are normal.  Neck: Neck supple.  Cardiovascular: Normal rate, regular rhythm, normal heart sounds and intact distal pulses.   Pulmonary/Chest: Effort normal and breath sounds normal. No respiratory distress. She has no wheezes. She has no rales. She exhibits no tenderness.  Neurological: She is alert and oriented to person, place, and time.  Skin: Skin is warm and dry. Rash noted. Rash is papular.  Papular rash of forearms bilaterally. Some linear distribution but there are areas where lesions are clustered.   Psychiatric: Affect normal.  Vitals reviewed.   Recent Results (from the past 2160 hour(s))  POCT HgB A1C     Status: None   Collection Time: 07/04/17  1:54 PM  Result Value Ref Range   Hemoglobin A1C 8.3   POCT Influenza A/B     Status: Abnormal   Collection Time: 08/15/17 10:48 AM  Result Value Ref Range   Influenza A, POC Negative Negative   Influenza B, POC Positive (A) Negative  POCT rapid strep A     Status: Normal   Collection Time: 08/15/17 10:54 AM  Result Value Ref Range   Rapid Strep A Screen Negative Negative  POCT HgB A1C     Status: None   Collection Time: 09/04/17  4:08 PM    Result Value Ref Range   Hemoglobin A1C 8.2   TSH     Status: None   Collection Time: 09/04/17  4:29 PM  Result Value Ref Range   TSH 1.10 0.35 - 4.50 uIU/mL  CBC w/Diff     Status: Abnormal   Collection Time: 09/09/17 10:26 AM  Result Value Ref Range   WBC 5.0 4.0 - 10.5 K/uL   RBC 4.01 3.87 - 5.11 Mil/uL   Hemoglobin 11.6 (L) 12.0 - 15.0 g/dL   HCT 35.9 (L) 36.0 - 46.0 %   MCV 89.4 78.0 - 100.0 fl   MCHC 32.3 30.0 - 36.0 g/dL   RDW 14.3 11.5 - 15.5 %   Platelets 350.0 150.0 - 400.0 K/uL   Neutrophils Relative % 52.3 43.0 - 77.0 %   Lymphocytes Relative 37.3 12.0 - 46.0 %   Monocytes Relative 8.7 3.0 - 12.0 %   Eosinophils Relative 1.3 0.0 - 5.0 %   Basophils Relative 0.4 0.0 - 3.0 %   Neutro Abs 2.6 1.4 - 7.7 K/uL   Lymphs Abs 1.9 0.7 - 4.0 K/uL   Monocytes Absolute 0.4 0.1 - 1.0 K/uL   Eosinophils Absolute 0.1 0.0 - 0.7 K/uL   Basophils Absolute 0.0 0.0 - 0.1 K/uL    Assessment/Plan: 1. 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 Will start topical emollient. Patient referred to dermatology for confirmation and further management.  - Ambulatory referral to Dermatology   Leeanne Rio, PA-C

## 2017-09-11 NOTE — Patient Instructions (Signed)
Please keep skin clean and dry. Try a topical emollient to the skin -- can get over the counter in the hygiene or makeup section. See if this helps.   I am setting you up with Dermatology for further assessment of this rash.  If you note any new symptoms, please give [place] a call.

## 2017-09-11 NOTE — Progress Notes (Signed)
Pre visit review using our clinic review tool, if applicable. No additional management support is needed unless otherwise documented below in the visit note. 

## 2017-09-13 ENCOUNTER — Telehealth: Payer: Self-pay | Admitting: Endocrinology

## 2017-09-13 NOTE — Telephone Encounter (Signed)
I am returning disability form.  I see pt only for diabetes, and she has no disability from that standpoint.

## 2017-09-25 NOTE — Telephone Encounter (Signed)
Called and left patient a detailed VM that disability forms were sent back due to Dr. Loanne Drilling only seeing patient for diabetes.

## 2017-10-11 ENCOUNTER — Ambulatory Visit (INDEPENDENT_AMBULATORY_CARE_PROVIDER_SITE_OTHER): Payer: BLUE CROSS/BLUE SHIELD | Admitting: Physician Assistant

## 2017-10-11 ENCOUNTER — Encounter: Payer: Self-pay | Admitting: Physician Assistant

## 2017-10-11 VITALS — BP 120/84 | HR 100 | Temp 98.6°F | Resp 14 | Ht 66.0 in | Wt 222.0 lb

## 2017-10-11 DIAGNOSIS — R0789 Other chest pain: Secondary | ICD-10-CM | POA: Diagnosis not present

## 2017-10-11 MED ORDER — MELOXICAM 7.5 MG PO TABS: 7.5000 mg | ORAL_TABLET | Freq: Every day | ORAL | 0 refills | Status: DC

## 2017-10-11 NOTE — Patient Instructions (Signed)
Please avoid heavy lifting or overexertion. Apply heating pad to the area in 10-minute intervals.  Topical Icy Hot to the area. Meloxicam as directed. Follow-up if not resolving.   If you note any worsening pain or any racing heart, shortness of breath, dizziness, please call 911 or have someone carry you to the ER.

## 2017-10-11 NOTE — Progress Notes (Signed)
Pre visit review using our clinic review tool, if applicable. No additional management support is needed unless otherwise documented below in the visit note. 

## 2017-10-11 NOTE — Progress Notes (Signed)
 Patient presents to clinic today c/o pain in left side and under left breast starting earlier today. States she felt fine when going to sleep. Woke up feeling fine but as she went about her morning routine, noticed the pain. Denies pain in the breast itself. Denies sternal pain. Denies racing heart, SOB, lightheadedness. Wanted to make sure it was not her heart. States pain is worse with ROM. Denies heavy lifting or known trauma or injury.  Past Medical History:  Diagnosis Date  . Anemia   . Arthritis    bilateral knees  . Bronchitis    hx of  . Fibroid tumor    Had partial hysterectomy  . Gallstones   . Migraine    hx of    Current Outpatient Prescriptions on File Prior to Visit  Medication Sig Dispense Refill  . atorvastatin (LIPITOR) 10 MG tablet TAKE 1 TABLET BY MOUTH EVERY DAY 90 tablet 1  . azelastine (ASTELIN) 0.1 % nasal spray Place 2 sprays into both nostrils 2 (two) times daily. Use in each nostril as directed 30 mL 12  . BAYER CONTOUR TEST test strip USE TWICE DAILY AS INSTRUCTED TO CHECK SUGARS. DX E. 11.9 100 each 5  . BD PEN NEEDLE NANO U/F 32G X 4 MM MISC USE DAILY TO ADMINISTER INSULIN AS DIRECTED  2  . Blood Glucose Monitoring Suppl (CONTOUR USB BLOOD GLUCOSE SYS) w/Device KIT Use daily as directed. 1 kit 0  . Cinnamon 500 MG capsule Take 1,000 mg by mouth 2 (two) times daily.    . cyclobenzaprine (FLEXERIL) 10 MG tablet TAKE 1 TABLET BY MOUTH AT BEDTIME 30 tablet 1  . Echinacea-Goldenseal (ECHINACEA COMB/GOLDEN SEAL PO) Take by mouth.    . fluticasone (FLONASE) 50 MCG/ACT nasal spray Place 2 sprays into both nostrils daily. 16 g 2  . gabapentin (NEURONTIN) 300 MG capsule Take 1 capsule (300 mg total) by mouth 3 (three) times daily. 270 capsule 1  . GLUCOSAMINE PO Take 1 tablet by mouth 2 (two) times daily.    . ibuprofen (ADVIL,MOTRIN) 200 MG tablet Take 200 mg by mouth every 6 (six) hours as needed. Reported on 02/13/2016    . levocetirizine (XYZAL) 5 MG tablet Take  1 tablet (5 mg total) by mouth every evening. 30 tablet 1  . loratadine (CLARITIN) 10 MG tablet TAKE 1 TABLET BY MOUTH EVERY DAY 30 tablet 2  . losartan-hydrochlorothiazide (HYZAAR) 50-12.5 MG tablet Take 1 tablet by mouth daily. 90 tablet 1  . metFORMIN (GLUCOPHAGE) 500 MG tablet TAKE 1 TABLET (500 MG TOTAL) BY MOUTH 2 (TWO) TIMES DAILY WITH A MEAL. 180 tablet 3  . Misc Natural Products (HERBAL ENERGY COMPLEX PO) Take by mouth 2 (two) times daily with a meal.    . Multiple Vitamins-Minerals (WOMENS MULTIVITAMIN PO) Take by mouth daily.    . Omega-3 1000 MG CAPS Take 1 g by mouth daily.    . ONETOUCH DELICA LANCETS FINE MISC Use as directed.  Dx:E11.9 100 each 12  . ONGLYZA 5 MG TABS tablet TAKE 1 TABLET (5 MG TOTAL) BY MOUTH DAILY. 30 tablet 11  . TRESIBA FLEXTOUCH 200 UNIT/ML SOPN Inject 60 Units into the skin daily. 6 pen 11  . triamcinolone cream (KENALOG) 0.1 % Apply 1 application topically 2 (two) times daily. 30 g 0  . vitamin B-12 (CYANOCOBALAMIN) 100 MCG tablet Take 100 mcg by mouth daily.    . vitamin C (ASCORBIC ACID) 500 MG tablet Take 1,000 mg by mouth daily.    .   vitamin E 400 UNIT capsule Take 400 Units by mouth daily. Reported on 02/13/2016     No current facility-administered medications on file prior to visit.     Allergies  Allergen Reactions  . Penicillins Hives    Hives   . Tramadol Itching    Family History  Problem Relation Age of Onset  . Diabetes Father 32       Deceased  . Diabetes Mother        Living  . Hyperlipidemia Mother   . Tuberculosis Mother   . Asthma Mother   . Heart disease Maternal Uncle   . Colon cancer Maternal Uncle        dx in 60's  . Diabetes Brother   . Heart disease Maternal Aunt   . Arthritis Other        Maternal Aunts & Uncles  . Diabetes Sister   . Kidney failure Brother 30       Diseased  . Healthy Son        x1  . Breast cancer Cousin 34  . Esophageal cancer Neg Hx   . Rectal cancer Neg Hx   . Stomach cancer Neg Hx       Social History   Social History  . Marital status: Single    Spouse name: N/A  . Number of children: N/A  . Years of education: N/A   Social History Main Topics  . Smoking status: Former Smoker    Packs/day: 0.25    Years: 20.00    Types: Cigarettes    Quit date: 12/09/2009  . Smokeless tobacco: Never Used  . Alcohol use 0.0 oz/week     Comment: social  . Drug use: No  . Sexual activity: Not Currently   Other Topics Concern  . None   Social History Narrative  . None   Review of Systems - See HPI.  All other ROS are negative.  BP 120/84   Pulse 100   Temp 98.6 F (37 C) (Oral)   Resp 14   Ht 5' 6" (1.676 m)   Wt 222 lb (100.7 kg)   SpO2 98%   BMI 35.83 kg/m   Physical Exam  Constitutional: She is oriented to person, place, and time and well-developed, well-nourished, and in no distress.  HENT:  Head: Normocephalic and atraumatic.  Eyes: Conjunctivae are normal.  Neck: Neck supple.  Cardiovascular: Normal rate, regular rhythm, normal heart sounds and intact distal pulses.   Pulmonary/Chest: Effort normal and breath sounds normal. No respiratory distress. She has no wheezes. She has no rales. She exhibits tenderness.  Musculoskeletal:       Arms: Neurological: She is alert and oriented to person, place, and time.  Skin: Skin is warm and dry. No rash noted.  Psychiatric: Affect normal.  Vitals reviewed.   Recent Results (from the past 2160 hour(s))  POCT Influenza A/B     Status: Abnormal   Collection Time: 08/15/17 10:48 AM  Result Value Ref Range   Influenza A, POC Negative Negative   Influenza B, POC Positive (A) Negative  POCT rapid strep A     Status: Normal   Collection Time: 08/15/17 10:54 AM  Result Value Ref Range   Rapid Strep A Screen Negative Negative  POCT HgB A1C     Status: None   Collection Time: 09/04/17  4:08 PM  Result Value Ref Range   Hemoglobin A1C 8.2   TSH     Status: None   Collection   Time: 09/04/17  4:29 PM  Result  Value Ref Range   TSH 1.10 0.35 - 4.50 uIU/mL  CBC w/Diff     Status: Abnormal   Collection Time: 09/09/17 10:26 AM  Result Value Ref Range   WBC 5.0 4.0 - 10.5 K/uL   RBC 4.01 3.87 - 5.11 Mil/uL   Hemoglobin 11.6 (L) 12.0 - 15.0 g/dL   HCT 35.9 (L) 36.0 - 46.0 %   MCV 89.4 78.0 - 100.0 fl   MCHC 32.3 30.0 - 36.0 g/dL   RDW 14.3 11.5 - 15.5 %   Platelets 350.0 150.0 - 400.0 K/uL   Neutrophils Relative % 52.3 43.0 - 77.0 %   Lymphocytes Relative 37.3 12.0 - 46.0 %   Monocytes Relative 8.7 3.0 - 12.0 %   Eosinophils Relative 1.3 0.0 - 5.0 %   Basophils Relative 0.4 0.0 - 3.0 %   Neutro Abs 2.6 1.4 - 7.7 K/uL   Lymphs Abs 1.9 0.7 - 4.0 K/uL   Monocytes Absolute 0.4 0.1 - 1.0 K/uL   Eosinophils Absolute 0.1 0.0 - 0.7 K/uL   Basophils Absolute 0.0 0.0 - 0.1 K/uL    Assessment/Plan: 1. Left-sided chest wall pain EKG obtained giving patient concerns. NSR with t-wave abnormality, unchanged from prior tracings. Exam reveals MSK etiology of symptoms. Supportive measures, OTC medications reviewed. Mobic once daily for a few days to help calm down. Alarm signs/symptoms discussed that would prompt need for ER assessment.  - EKG 12-Lead   Martin, William Cody, PA-C 

## 2017-11-04 ENCOUNTER — Ambulatory Visit (INDEPENDENT_AMBULATORY_CARE_PROVIDER_SITE_OTHER): Payer: BLUE CROSS/BLUE SHIELD | Admitting: Endocrinology

## 2017-11-04 ENCOUNTER — Encounter: Payer: Self-pay | Admitting: Endocrinology

## 2017-11-04 VITALS — BP 110/92 | HR 77 | Wt 215.8 lb

## 2017-11-04 DIAGNOSIS — E119 Type 2 diabetes mellitus without complications: Secondary | ICD-10-CM

## 2017-11-04 LAB — POCT GLYCOSYLATED HEMOGLOBIN (HGB A1C): Hemoglobin A1C: 12.8

## 2017-11-04 MED ORDER — TRESIBA FLEXTOUCH 200 UNIT/ML ~~LOC~~ SOPN: 90.0000 [IU] | PEN_INJECTOR | Freq: Every day | SUBCUTANEOUS | 11 refills | Status: DC

## 2017-11-04 NOTE — Progress Notes (Signed)
Subjective:    Patient ID: [name], female    DOB: 02-Jul-1961, 56 y.o.   MRN: [id number]  HPI Pt returns for f/u of diabetes mellitus:  DM type: Insulin-requiring type 2.  Dx'ed: 2017, when she had a cbg of 400 on steroids.  Complications: none.  Therapy: insulin and 2 oral meds.   GDM: never.   DKA: never.  Severe hypoglycemia: never.   Pancreatitis: never.    Other: she is on qd insulin, due to noncompliance.   Interval history: Pt says she never misses the insulin.  no cbg record, but states cbg's vary from 100-300's. She takes tresiba, 70 units qd.  No recent steroids.    Past Medical History:  Diagnosis Date  . Anemia   . Arthritis    bilateral knees  . Bronchitis    hx of  . Fibroid tumor    Had partial hysterectomy  . Gallstones   . Migraine    hx of    Past Surgical History:  Procedure Laterality Date  . ABDOMINAL HYSTERECTOMY     partial   . boil removed     . CARPAL TUNNEL RELEASE  04-15-15  . CHOLECYSTECTOMY  Oct 2012  . ELBOW SURGERY  04-15-15  . HAND SURGERY  04-15-15  . Nerve damage  04-15-15  . TOTAL KNEE ARTHROPLASTY  08/01/2012   Procedure: TOTAL KNEE ARTHROPLASTY;  Surgeon: Augustin Schooling, MD;  Location: [facility];  Service: Orthopedics;  Laterality: Right;  RIGHT TOTAL KNEE ARTHROPLASTY  . TRIGGER FINGER RELEASE  04-15-15  . TUBAL LIGATION    . WISDOM TOOTH EXTRACTION      Social History   Socioeconomic History  . Marital status: Single    Spouse name: Not on file  . Number of children: Not on file  . Years of education: Not on file  . Highest education level: Not on file  Social Needs  . Financial resource strain: Not on file  . Food insecurity - worry: Not on file  . Food insecurity - inability: Not on file  . Transportation needs - medical: Not on file  . Transportation needs - non-medical: Not on file  Occupational History  . Not on file  Tobacco Use  . Smoking status: Former Smoker    Packs/day: 0.25    Years: 20.00    Pack years:  5.00    Types: Cigarettes    Last attempt to quit: 12/09/2009    Years since quitting: 7.9  . Smokeless tobacco: Never Used  Substance and Sexual Activity  . Alcohol use: Yes    Alcohol/week: 0.0 oz    Comment: social  . Drug use: No  . Sexual activity: Not Currently  Other Topics Concern  . Not on file  Social History Narrative  . Not on file    Current Outpatient Medications on File Prior to Visit  Medication Sig Dispense Refill  . atorvastatin (LIPITOR) 10 MG tablet TAKE 1 TABLET BY MOUTH EVERY DAY 90 tablet 1  . azelastine (ASTELIN) 0.1 % nasal spray Place 2 sprays into both nostrils 2 (two) times daily. Use in each nostril as directed 30 mL 12  . BAYER CONTOUR TEST test strip USE TWICE DAILY AS INSTRUCTED TO CHECK SUGARS. DX E. 11.9 100 each 5  . BD PEN NEEDLE NANO U/F 32G X 4 MM MISC USE DAILY TO ADMINISTER INSULIN AS DIRECTED  2  . Blood Glucose Monitoring Suppl (CONTOUR USB BLOOD GLUCOSE SYS) w/Device KIT Use  daily as directed. 1 kit 0  . Cinnamon 500 MG capsule Take 1,000 mg by mouth 2 (two) times daily.    . cyclobenzaprine (FLEXERIL) 10 MG tablet TAKE 1 TABLET BY MOUTH AT BEDTIME 30 tablet 1  . Echinacea-Goldenseal (ECHINACEA COMB/GOLDEN SEAL PO) Take by mouth.    . fluticasone (FLONASE) 50 MCG/ACT nasal spray Place 2 sprays into both nostrils daily. 16 g 2  . gabapentin (NEURONTIN) 300 MG capsule Take 1 capsule (300 mg total) by mouth 3 (three) times daily. 270 capsule 1  . GLUCOSAMINE PO Take 1 tablet by mouth 2 (two) times daily.    Marland Kitchen ibuprofen (ADVIL,MOTRIN) 200 MG tablet Take 200 mg by mouth every 6 (six) hours as needed. Reported on 02/13/2016    . levocetirizine (XYZAL) 5 MG tablet Take 1 tablet (5 mg total) by mouth every evening. 30 tablet 1  . loratadine (CLARITIN) 10 MG tablet TAKE 1 TABLET BY MOUTH EVERY DAY 30 tablet 2  . losartan-hydrochlorothiazide (HYZAAR) 50-12.5 MG tablet Take 1 tablet by mouth daily. 90 tablet 1  . meloxicam (MOBIC) 7.5 MG tablet Take 1  tablet (7.5 mg total) by mouth daily. 30 tablet 0  . metFORMIN (GLUCOPHAGE) 500 MG tablet TAKE 1 TABLET (500 MG TOTAL) BY MOUTH 2 (TWO) TIMES DAILY WITH A MEAL. 180 tablet 3  . Misc Natural Products (HERBAL ENERGY COMPLEX PO) Take by mouth 2 (two) times daily with a meal.    . Multiple Vitamins-Minerals (WOMENS MULTIVITAMIN PO) Take by mouth daily.    . Omega-3 1000 MG CAPS Take 1 g by mouth daily.    [name] DELICA LANCETS FINE MISC Use as directed.  Dx:E11.9 100 each 12  . ONGLYZA 5 MG TABS tablet TAKE 1 TABLET (5 MG TOTAL) BY MOUTH DAILY. 30 tablet 11  . triamcinolone cream (KENALOG) 0.1 % Apply 1 application topically 2 (two) times daily. 30 g 0  . vitamin B-12 (CYANOCOBALAMIN) 100 MCG tablet Take 100 mcg by mouth daily.    . vitamin C (ASCORBIC ACID) 500 MG tablet Take 1,000 mg by mouth daily.    . vitamin E 400 UNIT capsule Take 400 Units by mouth daily. Reported on 02/13/2016     No current facility-administered medications on file prior to visit.     Allergies  Allergen Reactions  . Penicillins Hives    Hives   . Tramadol Itching    Family History  Problem Relation Age of Onset  . Diabetes Father 39       Deceased  . Diabetes Mother        Living  . Hyperlipidemia Mother   . Tuberculosis Mother   . Asthma Mother   . Heart disease Maternal Uncle   . Colon cancer Maternal Uncle        dx in 51's  . Diabetes Brother   . Heart disease Maternal Aunt   . Arthritis Other        Maternal Aunts & Uncles  . Diabetes Sister   . Kidney failure Brother 30       Diseased  . Healthy Son        x1  . Breast cancer Cousin 62  . Esophageal cancer Neg Hx   . Rectal cancer Neg Hx   . Stomach cancer Neg Hx     BP (!) 110/92 (BP Location: Right Arm, Patient Position: Sitting, Cuff Size: Normal)   Pulse 77   Wt 215 lb 12.8 oz (97.9 kg)   SpO2  93%   BMI 34.83 kg/m    Review of Systems She denies hypoglycemia.     Objective:   Physical Exam VITAL SIGNS:  See vs page.     GENERAL: no distress.   Pulses: foot pulses are intact bilaterally.   MSK: no deformity of the feet or ankles.  CV: no edema of the legs or ankles.   Skin:  no ulcer on the feet or ankles.  normal color and temp on the feet and ankles.   Neuro: sensation is intact to touch on the feet and ankles.     Lab Results  Component Value Date   HGBA1C 12.8 11/04/2017      Assessment & Plan:  Insulin-requiring type 2 DM: very poor control.  Noncompliance with cbg recording, persistent: She has variety of functional problems, including coming in for the wrong day for this appt.   Patient Instructions  check your blood sugar once a day.  vary the time of day when you check, between before the 3 meals, and at bedtime.  also check if you have symptoms of your blood sugar being too high or too low.  please keep a record of the readings and bring it to your next appointment here (or you can bring the meter itself).  You can write it on any piece of paper.  please call us sooner if your blood sugar goes below 70, or if you have a lot of readings over 200.   Please increase the tresiba to 90 unitss daily.  Please continue the same other diabetes medications.  Please come back for a follow-up appointment in 2 months.

## 2017-11-04 NOTE — Patient Instructions (Addendum)
check your blood sugar once a day.  vary the time of day when you check, between before the 3 meals, and at bedtime.  also check if you have symptoms of your blood sugar being too high or too low.  please keep a record of the readings and bring it to your next appointment here (or you can bring the meter itself).  You can write it on any piece of paper.  please call us sooner if your blood sugar goes below 70, or if you have a lot of readings over 200.   Please increase the tresiba to 90 unitss daily.  Please continue the same other diabetes medications.  Please come back for a follow-up appointment in 2 months.

## 2017-11-05 ENCOUNTER — Ambulatory Visit: Payer: BLUE CROSS/BLUE SHIELD | Admitting: Endocrinology

## 2017-11-18 ENCOUNTER — Other Ambulatory Visit: Payer: Self-pay | Admitting: Physician Assistant

## 2017-11-18 DIAGNOSIS — M62838 Other muscle spasm: Secondary | ICD-10-CM

## 2017-11-23 ENCOUNTER — Other Ambulatory Visit: Payer: Self-pay | Admitting: Physician Assistant

## 2017-12-02 ENCOUNTER — Other Ambulatory Visit: Payer: Self-pay | Admitting: Physician Assistant

## 2018-01-02 ENCOUNTER — Other Ambulatory Visit: Payer: Self-pay | Admitting: Physician Assistant

## 2018-01-02 NOTE — Telephone Encounter (Signed)
Last OV 10/11/17 meloxicam last filled 11/26/17 #30 with 0

## 2018-01-07 ENCOUNTER — Ambulatory Visit: Payer: BLUE CROSS/BLUE SHIELD | Admitting: Endocrinology

## 2018-01-29 ENCOUNTER — Encounter: Payer: Self-pay | Admitting: Endocrinology

## 2018-01-29 ENCOUNTER — Ambulatory Visit: Payer: BLUE CROSS/BLUE SHIELD | Admitting: Endocrinology

## 2018-01-29 VITALS — BP 110/80 | HR 83 | Wt 232.8 lb

## 2018-01-29 DIAGNOSIS — E119 Type 2 diabetes mellitus without complications: Secondary | ICD-10-CM

## 2018-01-29 DIAGNOSIS — M5412 Radiculopathy, cervical region: Secondary | ICD-10-CM | POA: Diagnosis not present

## 2018-01-29 LAB — POCT GLYCOSYLATED HEMOGLOBIN (HGB A1C): HEMOGLOBIN A1C: 7.1

## 2018-01-29 MED ORDER — EMPAGLIFLOZIN 10 MG PO TABS: 10.0000 mg | ORAL_TABLET | Freq: Every day | ORAL | 11 refills | Status: DC

## 2018-01-29 MED ORDER — TRESIBA FLEXTOUCH 200 UNIT/ML ~~LOC~~ SOPN: 80.0000 [IU] | PEN_INJECTOR | Freq: Every day | SUBCUTANEOUS | 11 refills | Status: DC

## 2018-01-29 NOTE — Progress Notes (Signed)
Subjective:    Patient ID: [name], female    DOB: 1960-12-14, 57 y.o.   MRN: [id number]  HPI Pt returns for f/u of diabetes mellitus:  DM type: Insulin-requiring type 2.  Dx'ed: 2017, when she had a cbg of 400 on steroids.  Complications: none.  Therapy: insulin and 2 oral meds.   GDM: never.   DKA: never.  Severe hypoglycemia: never.   Pancreatitis: never.    Other: she is on qd insulin, due to noncompliance.   Interval history: Pt says she never misses the insulin.  no cbg record, but states cbg's vary from 80-130.  No recent steroids.  Main symptom is weight gain.  Past Medical History:  Diagnosis Date  . Anemia   . Arthritis    bilateral knees  . Bronchitis    hx of  . Fibroid tumor    Had partial hysterectomy  . Gallstones   . Migraine    hx of    Past Surgical History:  Procedure Laterality Date  . ABDOMINAL HYSTERECTOMY     partial   . boil removed     . CARPAL TUNNEL RELEASE  04-15-15  . CHOLECYSTECTOMY  Oct 2012  . ELBOW SURGERY  04-15-15  . HAND SURGERY  04-15-15  . Nerve damage  04-15-15  . TOTAL KNEE ARTHROPLASTY  08/01/2012   Procedure: TOTAL KNEE ARTHROPLASTY;  Surgeon: Augustin Schooling, MD;  Location: [facility];  Service: Orthopedics;  Laterality: Right;  RIGHT TOTAL KNEE ARTHROPLASTY  . TRIGGER FINGER RELEASE  04-15-15  . TUBAL LIGATION    . WISDOM TOOTH EXTRACTION      Social History   Socioeconomic History  . Marital status: Single    Spouse name: Not on file  . Number of children: Not on file  . Years of education: Not on file  . Highest education level: Not on file  Social Needs  . Financial resource strain: Not on file  . Food insecurity - worry: Not on file  . Food insecurity - inability: Not on file  . Transportation needs - medical: Not on file  . Transportation needs - non-medical: Not on file  Occupational History  . Not on file  Tobacco Use  . Smoking status: Former Smoker    Packs/day: 0.25    Years: 20.00    Pack years: 5.00      Types: Cigarettes    Last attempt to quit: 12/09/2009    Years since quitting: 8.1  . Smokeless tobacco: Never Used  Substance and Sexual Activity  . Alcohol use: Yes    Alcohol/week: 0.0 oz    Comment: social  . Drug use: No  . Sexual activity: Not Currently  Other Topics Concern  . Not on file  Social History Narrative  . Not on file    Current Outpatient Medications on File Prior to Visit  Medication Sig Dispense Refill  . atorvastatin (LIPITOR) 10 MG tablet TAKE 1 TABLET BY MOUTH EVERY DAY 90 tablet 1  . azelastine (ASTELIN) 0.1 % nasal spray Place 2 sprays into both nostrils 2 (two) times daily. Use in each nostril as directed 30 mL 12  . BAYER CONTOUR TEST test strip USE TWICE DAILY AS INSTRUCTED TO CHECK SUGARS. DX E. 11.9 100 each 5  . BD PEN NEEDLE NANO U/F 32G X 4 MM MISC USE DAILY TO ADMINISTER INSULIN AS DIRECTED  2  . Blood Glucose Monitoring Suppl (CONTOUR USB BLOOD GLUCOSE SYS) w/Device KIT Use daily  as directed. 1 kit 0  . Cinnamon 500 MG capsule Take 1,000 mg by mouth 2 (two) times daily.    . cyclobenzaprine (FLEXERIL) 10 MG tablet TAKE 1 TABLET BY MOUTH AT BEDTIME 30 tablet 1  . Echinacea-Goldenseal (ECHINACEA COMB/GOLDEN SEAL PO) Take by mouth.    . fluticasone (FLONASE) 50 MCG/ACT nasal spray Place 2 sprays into both nostrils daily. 16 g 2  . gabapentin (NEURONTIN) 300 MG capsule Take 1 capsule (300 mg total) by mouth 3 (three) times daily. 270 capsule 1  . GLUCOSAMINE PO Take 1 tablet by mouth 2 (two) times daily.    Marland Kitchen ibuprofen (ADVIL,MOTRIN) 200 MG tablet Take 200 mg by mouth every 6 (six) hours as needed. Reported on 02/13/2016    . levocetirizine (XYZAL) 5 MG tablet TAKE 1 TABLET BY MOUTH EVERY DAY IN THE EVENING 30 tablet 1  . loratadine (CLARITIN) 10 MG tablet TAKE 1 TABLET BY MOUTH EVERY DAY 30 tablet 2  . losartan-hydrochlorothiazide (HYZAAR) 50-12.5 MG tablet Take 1 tablet by mouth daily. 90 tablet 1  . meloxicam (MOBIC) 7.5 MG tablet TAKE 1 TABLET BY  MOUTH EVERY DAY 30 tablet 0  . metFORMIN (GLUCOPHAGE) 500 MG tablet TAKE 1 TABLET (500 MG TOTAL) BY MOUTH 2 (TWO) TIMES DAILY WITH A MEAL. 180 tablet 3  . Misc Natural Products (HERBAL ENERGY COMPLEX PO) Take by mouth 2 (two) times daily with a meal.    . Multiple Vitamins-Minerals (WOMENS MULTIVITAMIN PO) Take by mouth daily.    . Omega-3 1000 MG CAPS Take 1 g by mouth daily.    [name] DELICA LANCETS FINE MISC Use as directed.  Dx:E11.9 100 each 12  . ONGLYZA 5 MG TABS tablet TAKE 1 TABLET (5 MG TOTAL) BY MOUTH DAILY. 30 tablet 11  . triamcinolone cream (KENALOG) 0.1 % Apply 1 application topically 2 (two) times daily. 30 g 0  . vitamin B-12 (CYANOCOBALAMIN) 100 MCG tablet Take 100 mcg by mouth daily.    . vitamin C (ASCORBIC ACID) 500 MG tablet Take 1,000 mg by mouth daily.    . vitamin E 400 UNIT capsule Take 400 Units by mouth daily. Reported on 02/13/2016     No current facility-administered medications on file prior to visit.     Allergies  Allergen Reactions  . Penicillins Hives    Hives   . Tramadol Itching    Family History  Problem Relation Age of Onset  . Diabetes Father 71       Deceased  . Diabetes Mother        Living  . Hyperlipidemia Mother   . Tuberculosis Mother   . Asthma Mother   . Heart disease Maternal Uncle   . Colon cancer Maternal Uncle        dx in 36's  . Diabetes Brother   . Heart disease Maternal Aunt   . Arthritis Other        Maternal Aunts & Uncles  . Diabetes Sister   . Kidney failure Brother 30       Diseased  . Healthy Son        x1  . Breast cancer Cousin 59  . Esophageal cancer Neg Hx   . Rectal cancer Neg Hx   . Stomach cancer Neg Hx     BP 110/80 (BP Location: Right Arm, Patient Position: Sitting, Cuff Size: Normal)   Pulse 83   Wt 232 lb 12.8 oz (105.6 kg)   BMI 37.57 kg/m  Review of Systems She denies hypoglycemia.     Objective:   Physical Exam VITAL SIGNS:  See vs page GENERAL: no distress Pulses: dorsalis  pedis intact bilat.   MSK: no deformity of the feet CV: no leg edema Skin:  no ulcer on the feet.  normal color and temp on the feet. Neuro: sensation is intact to touch on the feet.    Lab Results  Component Value Date   HGBA1C 7.1 01/29/2018       Assessment & Plan:  Insulin-requiring type 2 DM: she needs increased rx, if it can be done with a regimen that avoids or minimizes hypoglycemia. Obesity: worse.  She requests for [place] to adjust meds for this.  Patient Instructions  I have sent 2 prescriptions to your pharmacy: to add "jardiance," and to slightly reduce the tresiba. check your blood sugar twice a day.  vary the time of day when you check, between before the 3 meals, and at bedtime.  also check if you have symptoms of your blood sugar being too high or too low.  please keep a record of the readings and bring it to your next appointment here (or you can bring the meter itself).  You can write it on any piece of paper.  please call us sooner if your blood sugar goes below 70, or if you have a lot of readings over 200. Please come back for a follow-up appointment in 3 months.

## 2018-01-29 NOTE — Patient Instructions (Signed)
I have sent 2 prescriptions to your pharmacy: to add "jardiance," and to slightly reduce the tresiba. check your blood sugar twice a day.  vary the time of day when you check, between before the 3 meals, and at bedtime.  also check if you have symptoms of your blood sugar being too high or too low.  please keep a record of the readings and bring it to your next appointment here (or you can bring the meter itself).  You can write it on any piece of paper.  please call us sooner if your blood sugar goes below 70, or if you have a lot of readings over 200. Please come back for a follow-up appointment in 3 months.

## 2018-02-18 ENCOUNTER — Other Ambulatory Visit: Payer: Self-pay | Admitting: Family Medicine

## 2018-02-18 ENCOUNTER — Other Ambulatory Visit: Payer: Self-pay | Admitting: Endocrinology

## 2018-02-18 ENCOUNTER — Other Ambulatory Visit: Payer: Self-pay | Admitting: Physician Assistant

## 2018-02-18 DIAGNOSIS — E785 Hyperlipidemia, unspecified: Secondary | ICD-10-CM

## 2018-02-18 DIAGNOSIS — M62838 Other muscle spasm: Secondary | ICD-10-CM

## 2018-03-01 ENCOUNTER — Other Ambulatory Visit: Payer: Self-pay | Admitting: Physician Assistant

## 2018-03-11 ENCOUNTER — Ambulatory Visit (INDEPENDENT_AMBULATORY_CARE_PROVIDER_SITE_OTHER): Payer: BLUE CROSS/BLUE SHIELD | Admitting: Physician Assistant

## 2018-03-11 ENCOUNTER — Encounter: Payer: Self-pay | Admitting: Physician Assistant

## 2018-03-11 ENCOUNTER — Other Ambulatory Visit: Payer: Self-pay

## 2018-03-11 VITALS — BP 132/82 | HR 86 | Temp 98.3°F | Resp 16 | Ht 66.0 in | Wt 230.0 lb

## 2018-03-11 DIAGNOSIS — Z23 Encounter for immunization: Secondary | ICD-10-CM | POA: Diagnosis not present

## 2018-03-11 DIAGNOSIS — Z1239 Encounter for other screening for malignant neoplasm of breast: Secondary | ICD-10-CM

## 2018-03-11 DIAGNOSIS — K219 Gastro-esophageal reflux disease without esophagitis: Secondary | ICD-10-CM

## 2018-03-11 DIAGNOSIS — Z1231 Encounter for screening mammogram for malignant neoplasm of breast: Secondary | ICD-10-CM | POA: Diagnosis not present

## 2018-03-11 DIAGNOSIS — Z0000 Encounter for general adult medical examination without abnormal findings: Secondary | ICD-10-CM | POA: Diagnosis not present

## 2018-03-11 HISTORY — DX: Gastro-esophageal reflux disease without esophagitis: K21.9

## 2018-03-11 LAB — COMPREHENSIVE METABOLIC PANEL
ALT: 17 U/L (ref 0–35)
AST: 17 U/L (ref 0–37)
Albumin: 4.1 g/dL (ref 3.5–5.2)
Alkaline Phosphatase: 117 U/L (ref 39–117)
BILIRUBIN TOTAL: 0.3 mg/dL (ref 0.2–1.2)
BUN: 17 mg/dL (ref 6–23)
CALCIUM: 9.9 mg/dL (ref 8.4–10.5)
CHLORIDE: 104 meq/L (ref 96–112)
CO2: 29 mEq/L (ref 19–32)
CREATININE: 0.82 mg/dL (ref 0.40–1.20)
GFR: 91.95 mL/min (ref >60.00)
GLUCOSE: 98 mg/dL (ref 70–99)
Potassium: 4.3 mEq/L (ref 3.5–5.1)
Sodium: 139 mEq/L (ref 135–145)
Total Protein: 7 g/dL (ref 6.0–8.3)

## 2018-03-11 LAB — TSH: TSH: 1.39 u[IU]/mL (ref 0.35–4.50)

## 2018-03-11 LAB — CBC WITH DIFFERENTIAL/PLATELET
BASOS ABS: 0 10*3/uL (ref 0.0–0.1)
BASOS PCT: 0.4 % (ref 0.0–3.0)
EOS ABS: 0.1 10*3/uL (ref 0.0–0.7)
Eosinophils Relative: 1.2 % (ref 0.0–5.0)
HCT: 36.4 % (ref 36.0–46.0)
Hemoglobin: 12 g/dL (ref 12.0–15.0)
Lymphocytes Relative: 40.1 % (ref 12.0–46.0)
Lymphs Abs: 1.8 10*3/uL (ref 0.7–4.0)
MCHC: 33.1 g/dL (ref 30.0–36.0)
MCV: 89 fl (ref 78.0–100.0)
Monocytes Absolute: 0.4 10*3/uL (ref 0.1–1.0)
Monocytes Relative: 7.7 % (ref 3.0–12.0)
NEUTROS ABS: 2.3 10*3/uL (ref 1.4–7.7)
NEUTROS PCT: 50.6 % (ref 43.0–77.0)
PLATELETS: 288 10*3/uL (ref 150.0–400.0)
RBC: 4.09 Mil/uL (ref 3.87–5.11)
RDW: 14.3 % (ref 11.5–15.5)
WBC: 4.6 10*3/uL (ref 4.0–10.5)

## 2018-03-11 LAB — LIPID PANEL
CHOL/HDL RATIO: 2
CHOLESTEROL: 142 mg/dL (ref 0–200)
HDL: 60.7 mg/dL (ref >39.00)
LDL CALC: 69 mg/dL (ref 0–99)
NonHDL: 81.3
TRIGLYCERIDES: 61 mg/dL (ref 0.0–149.0)
VLDL: 12.2 mg/dL (ref 0.0–40.0)

## 2018-03-11 MED ORDER — RANITIDINE HCL 150 MG PO CAPS: 150.0000 mg | ORAL_CAPSULE | Freq: Two times a day (BID) | ORAL | 1 refills | Status: DC | PRN

## 2018-03-11 NOTE — Progress Notes (Signed)
Patient presents to clinic today for annual exam.  Patient is fasting for labs.  Acute Concerns: Patient endorses occasional heart burn with meals, mostly with late-night eating or when using a vinaigrette dressing on salad. Is trying to avoid cream-based dressings due to calories. Will take a TUMS that occasionally helps with symptoms, although does not completely resolve them. Denies nausea/vomiting or epigastric pain. Denies pain with meals. Denies melena, hematochezia.   Health Maintenance: Immunizations -- Due for TDaP and Pneumovax. Agrees to Pneumovax. Declines tetanus.  Colonoscopy -- up-to-date.  Mammogram -- Due for repeat.  Last PAP -- s/p hysterectomy  Past Medical History:  Diagnosis Date  . Anemia   . Arthritis    bilateral knees  . Bronchitis    hx of  . Fibroid tumor    Had partial hysterectomy  . Gallstones   . Migraine    hx of    Past Surgical History:  Procedure Laterality Date  . ABDOMINAL HYSTERECTOMY     partial   . boil removed     . CARPAL TUNNEL RELEASE  04-15-15  . CHOLECYSTECTOMY  Oct 2012  . ELBOW SURGERY  04-15-15  . HAND SURGERY  04-15-15  . Nerve damage  04-15-15  . TOTAL KNEE ARTHROPLASTY  08/01/2012   Procedure: TOTAL KNEE ARTHROPLASTY;  Surgeon: Augustin Schooling, MD;  Location: [place];  Service: Orthopedics;  Laterality: Right;  RIGHT TOTAL KNEE ARTHROPLASTY  . TRIGGER FINGER RELEASE  04-15-15  . TUBAL LIGATION    . WISDOM TOOTH EXTRACTION      Current Outpatient Medications on File Prior to Visit  Medication Sig Dispense Refill  . atorvastatin (LIPITOR) 10 MG tablet TAKE 1 TABLET BY MOUTH EVERY DAY 90 tablet 1  . azelastine (ASTELIN) 0.1 % nasal spray Place 2 sprays into both nostrils 2 (two) times daily. Use in each nostril as directed 30 mL 12  . BAYER CONTOUR TEST test strip USE TWICE DAILY AS INSTRUCTED TO CHECK SUGARS. DX E. 11.9 100 each 5  . BD PEN NEEDLE NANO U/F 32G X 4 MM MISC USE DAILY TO ADMINISTER INSULIN AS DIRECTED 100 each 0    . Blood Glucose Monitoring Suppl (CONTOUR USB BLOOD GLUCOSE SYS) w/Device KIT Use daily as directed. 1 kit 0  . Cinnamon 500 MG capsule Take 1,000 mg by mouth 2 (two) times daily.    . cyclobenzaprine (FLEXERIL) 10 MG tablet TAKE 1 TABLET BY MOUTH AT BEDTIME 30 tablet 1  . Echinacea-Goldenseal (ECHINACEA COMB/GOLDEN SEAL PO) Take by mouth.    . empagliflozin (JARDIANCE) 10 MG TABS tablet Take 10 mg by mouth daily. 30 tablet 11  . fluocinonide cream (LIDEX) 8.20 % Apply 1 application topically 2 (two) times daily.    . fluticasone (FLONASE) 50 MCG/ACT nasal spray Place 2 sprays into both nostrils daily. 16 g 2  . gabapentin (NEURONTIN) 300 MG capsule Take 1 capsule (300 mg total) by mouth 3 (three) times daily. 270 capsule 1  . GLUCOSAMINE PO Take 1 tablet by mouth 2 (two) times daily.    Marland Kitchen levocetirizine (XYZAL) 5 MG tablet TAKE 1 TABLET BY MOUTH EVERY DAY IN THE EVENING 90 tablet 1  . loratadine (CLARITIN) 10 MG tablet TAKE 1 TABLET BY MOUTH EVERY DAY 30 tablet 2  . losartan-hydrochlorothiazide (HYZAAR) 50-12.5 MG tablet TAKE 1 TABLET BY MOUTH EVERY DAY 90 tablet 1  . metFORMIN (GLUCOPHAGE) 500 MG tablet TAKE 1 TABLET (500 MG TOTAL) BY MOUTH 2 (TWO) TIMES DAILY  WITH A MEAL. 180 tablet 3  . Misc Natural Products (HERBAL ENERGY COMPLEX PO) Take by mouth 2 (two) times daily with a meal.    . Multiple Vitamins-Minerals (WOMENS MULTIVITAMIN PO) Take by mouth daily.    . Omega-3 1000 MG CAPS Take 1 g by mouth daily.    [name] DELICA LANCETS FINE MISC Use as directed.  Dx:E11.9 100 each 12  . TRESIBA FLEXTOUCH 200 UNIT/ML SOPN Inject 80 Units into the skin daily. 6 pen 11  . vitamin B-12 (CYANOCOBALAMIN) 100 MCG tablet Take 100 mcg by mouth daily.    . vitamin C (ASCORBIC ACID) 500 MG tablet Take 1,000 mg by mouth daily.    . vitamin E 400 UNIT capsule Take 400 Units by mouth daily. Reported on 02/13/2016    . glimepiride (AMARYL) 4 MG tablet TAKE 1 TABLET (4 MG TOTAL) BY MOUTH DAILY BEFORE  BREAKFAST. (Patient not taking: Reported on 03/11/2018) 90 tablet 0  . ONGLYZA 5 MG TABS tablet TAKE 1 TABLET (5 MG TOTAL) BY MOUTH DAILY. (Patient not taking: Reported on 03/11/2018) 30 tablet 11   No current facility-administered medications on file prior to visit.     Allergies  Allergen Reactions  . Penicillins Hives    Hives   . Tramadol Itching    Family History  Problem Relation Age of Onset  . Diabetes Father 34       Deceased  . Diabetes Mother        Living  . Hyperlipidemia Mother   . Tuberculosis Mother   . Asthma Mother   . Heart disease Maternal Uncle   . Colon cancer Maternal Uncle        dx in 25's  . Diabetes Brother   . Heart disease Maternal Aunt   . Arthritis Other        Maternal Aunts & Uncles  . Diabetes Sister   . Kidney failure Brother 30       Diseased  . Healthy Son        x1  . Breast cancer Cousin 30  . Esophageal cancer Neg Hx   . Rectal cancer Neg Hx   . Stomach cancer Neg Hx     Social History   Socioeconomic History  . Marital status: Single    Spouse name: Not on file  . Number of children: Not on file  . Years of education: Not on file  . Highest education level: Not on file  Occupational History  . Not on file  Social Needs  . Financial resource strain: Not on file  . Food insecurity:    Worry: Not on file    Inability: Not on file  . Transportation needs:    Medical: Not on file    Non-medical: Not on file  Tobacco Use  . Smoking status: Former Smoker    Packs/day: 0.25    Years: 20.00    Pack years: 5.00    Types: Cigarettes    Last attempt to quit: 12/09/2009    Years since quitting: 8.2  . Smokeless tobacco: Never Used  Substance and Sexual Activity  . Alcohol use: Yes    Alcohol/week: 0.0 oz    Comment: social  . Drug use: No  . Sexual activity: Not Currently  Lifestyle  . Physical activity:    Days per week: Not on file    Minutes per session: Not on file  . Stress: Not on file  Relationships  .  Social connections:  Talks on phone: Not on file    Gets together: Not on file    Attends religious service: Not on file    Active member of club or organization: Not on file    Attends meetings of clubs or organizations: Not on file    Relationship status: Not on file  . Intimate partner violence:    Fear of current or ex partner: Not on file    Emotionally abused: Not on file    Physically abused: Not on file    Forced sexual activity: Not on file  Other Topics Concern  . Not on file  Social History Narrative  . Not on file   Review of Systems  Constitutional: Negative for fever and weight loss.  HENT: Negative for ear discharge, ear pain, hearing loss and tinnitus.   Eyes: Negative for blurred vision, double vision, photophobia and pain.  Respiratory: Negative for cough and shortness of breath.   Cardiovascular: Negative for chest pain and palpitations.  Gastrointestinal: Positive for heartburn. Negative for abdominal pain, blood in stool, constipation, diarrhea, melena, nausea and vomiting.  Genitourinary: Negative for dysuria, flank pain, frequency, hematuria and urgency.  Musculoskeletal: Negative for falls.  Neurological: Negative for dizziness, loss of consciousness and headaches.  Endo/Heme/Allergies: Negative for environmental allergies.  Psychiatric/Behavioral: Negative for depression, hallucinations, substance abuse and suicidal ideas. The patient is not nervous/anxious and does not have insomnia.    BP 132/82   Pulse 86   Temp 98.3 F (36.8 C) (Oral)   Resp 16   Ht _0  (1.676 m)   Wt 230 lb (104.3 kg)   SpO2 94%   BMI 37.12 kg/m   Physical Exam  Constitutional: She is oriented to person, place, and time and well-developed, well-nourished, and in no distress.  HENT:  Head: Normocephalic and atraumatic.  Right Ear: Tympanic membrane, external ear and ear canal normal.  Left Ear: Tympanic membrane, external ear and ear canal normal.  Nose: Nose normal. No  mucosal edema.  Mouth/Throat: Uvula is midline, oropharynx is clear and moist and mucous membranes are normal. No oropharyngeal exudate or posterior oropharyngeal erythema.  Eyes: Pupils are equal, round, and reactive to light. Conjunctivae are normal.  Neck: Neck supple. No thyromegaly present.  Cardiovascular: Normal rate, regular rhythm, normal heart sounds and intact distal pulses.  Pulmonary/Chest: Effort normal and breath sounds normal. No respiratory distress. She has no wheezes. She has no rales.  Abdominal: Soft. Bowel sounds are normal. She exhibits no distension and no mass. There is no tenderness. There is no rebound and no guarding.  Lymphadenopathy:    She has no cervical adenopathy.  Neurological: She is alert and oriented to person, place, and time. No cranial nerve deficit.  Skin: Skin is warm and dry. No rash noted.  Psychiatric: Affect normal.  Vitals reviewed.  Recent Results (from the past 2160 hour(s))  POCT glycosylated hemoglobin (Hb A1C)     Status: None   Collection Time: 01/29/18 11:25 AM  Result Value Ref Range   Hemoglobin A1C 7.1     Assessment/Plan: Gastroesophageal reflux disease without esophagitis GERD diet reviewed. Encouraged continued exercise for weight loss. Will start Zantac BID PRN.  Breast cancer screening Order for screening mammogram placed.   Need for pneumococcal vaccination Pneumovax given today.  Visit for preventive health examination Depression screen negative. Health Maintenance reviewed. Preventive schedule discussed and handout given in AVS. Will obtain fasting labs today.     Leeanne Rio, PA-C

## 2018-03-11 NOTE — Assessment & Plan Note (Signed)
Pneumovax given today

## 2018-03-11 NOTE — Assessment & Plan Note (Signed)
Order for screening mammogram placed.  

## 2018-03-11 NOTE — Patient Instructions (Signed)
Please go to the lab for blood work.   Our office will call you with your results unless you have chosen to receive results via MyChart.  If your blood work is normal we will follow-up each year for physicals and as scheduled for chronic medical problems.  If anything is abnormal we will treat accordingly and get you in for a follow-up.  Follow the dietary recommendations below. Start the Zantac up twice daily as needed for reflux.    Gastroesophageal Reflux Disease, Adult Normally, food travels down the esophagus and stays in the stomach to be digested. If a person has gastroesophageal reflux disease (GERD), food and stomach acid move back up into the esophagus. When this happens, the esophagus becomes sore and swollen (inflamed). Over time, GERD can make small holes (ulcers) in the lining of the esophagus. Follow these instructions at home: Diet  Follow a diet as told by your doctor. You may need to avoid foods and drinks such as: ? Coffee and tea (with or without caffeine). ? Drinks that contain alcohol. ? Energy drinks and sports drinks. ? Carbonated drinks or sodas. ? Chocolate and cocoa. ? Peppermint and mint flavorings. ? Garlic and onions. ? Horseradish. ? Spicy and acidic foods, such as peppers, chili powder, curry powder, vinegar, hot sauces, and BBQ sauce. ? Citrus fruit juices and citrus fruits, such as oranges, lemons, and limes. ? Tomato-based foods, such as red sauce, chili, salsa, and pizza with red sauce. ? Fried and fatty foods, such as donuts, french fries, potato chips, and high-fat dressings. ? High-fat meats, such as hot dogs, rib eye steak, sausage, ham, and bacon. ? High-fat dairy items, such as whole milk, butter, and cream cheese.  Eat small meals often. Avoid eating large meals.  Avoid drinking large amounts of liquid with your meals.  Avoid eating meals during the 2-3 hours before bedtime.  Avoid lying down right after you eat.  Do not exercise  right after you eat. General instructions  Pay attention to any changes in your symptoms.  Take over-the-counter and prescription medicines only as told by your doctor. Do not take aspirin, ibuprofen, or other NSAIDs unless your doctor says it is okay.  Do not use any tobacco products, including cigarettes, chewing tobacco, and e-cigarettes. If you need help quitting, ask your doctor.  Wear loose clothes. Do not wear anything tight around your waist.  Raise (elevate) the head of your bed about 6 inches (15 cm).  Try to lower your stress. If you need help doing this, ask your doctor.  If you are overweight, lose an amount of weight that is healthy for you. Ask your doctor about a safe weight loss goal.  Keep all follow-up visits as told by your doctor. This is important. Contact a doctor if:  You have new symptoms.  You lose weight and you do not know why it is happening.  You have trouble swallowing, or it hurts to swallow.  You have wheezing or a cough that keeps happening.  Your symptoms do not get better with treatment.  You have a hoarse voice. Get help right away if:  You have pain in your arms, neck, jaw, teeth, or back.  You feel sweaty, dizzy, or light-headed.  You have chest pain or shortness of breath.  You throw up (vomit) and your throw up looks like blood or coffee grounds.  You pass out (faint).  Your poop (stool) is bloody or black.  You cannot swallow, drink, or eat.  This information is not intended to replace advice given to you by your health care provider. Make sure you discuss any questions you have with your health care provider. Document Released: 05/14/2008 Document Revised: 05/03/2016 Document Reviewed: 03/23/2015 Elsevier Interactive Patient Education  2018 [place] Years, Female Preventive care refers to lifestyle choices and visits with your health care provider that can promote health and wellness. What  does preventive care include?  A yearly physical exam. This is also called an annual well check.  Dental exams once or twice a year.  Routine eye exams. Ask your health care provider how often you should have your eyes checked.  Personal lifestyle choices, including: ? Daily care of your teeth and gums. ? Regular physical activity. ? Eating a healthy diet. ? Avoiding tobacco and drug use. ? Limiting alcohol use. ? Practicing safe sex. ? Taking low-dose aspirin daily starting at age 5. ? Taking vitamin and mineral supplements as recommended by your health care provider. What happens during an annual well check? The services and screenings done by your health care provider during your annual well check will depend on your age, overall health, lifestyle risk factors, and family history of disease. Counseling Your health care provider may ask you questions about your:  Alcohol use.  Tobacco use.  Drug use.  Emotional well-being.  Home and relationship well-being.  Sexual activity.  Eating habits.  Work and work Statistician.  Method of birth control.  Menstrual cycle.  Pregnancy history.  Screening You may have the following tests or measurements:  Height, weight, and BMI.  Blood pressure.  Lipid and cholesterol levels. These may be checked every 5 years, or more frequently if you are over 38 years old.  Skin check.  Lung cancer screening. You may have this screening every year starting at age 31 if you have a 30-pack-year history of smoking and currently smoke or have quit within the past 15 years.  Fecal occult blood test (FOBT) of the stool. You may have this test every year starting at age 80.  Flexible sigmoidoscopy or colonoscopy. You may have a sigmoidoscopy every 5 years or a colonoscopy every 10 years starting at age 5.  Hepatitis C blood test.  Hepatitis B blood test.  Sexually transmitted disease (STD) testing.  Diabetes screening. This is  done by checking your blood sugar (glucose) after you have not eaten for a while (fasting). You may have this done every 1-3 years.  Mammogram. This may be done every 1-2 years. Talk to your health care provider about when you should start having regular mammograms. This may depend on whether you have a family history of breast cancer.  BRCA-related cancer screening. This may be done if you have a family history of breast, ovarian, tubal, or peritoneal cancers.  Pelvic exam and Pap test. This may be done every 3 years starting at age 11. Starting at age 71, this may be done every 5 years if you have a Pap test in combination with an HPV test.  Bone density scan. This is done to screen for osteoporosis. You may have this scan if you are at high risk for osteoporosis.  Discuss your test results, treatment options, and if necessary, the need for more tests with your health care provider. Vaccines Your health care provider may recommend certain vaccines, such as:  Influenza vaccine. This is recommended every year.  Tetanus, diphtheria, and acellular pertussis (Tdap, Td) vaccine. You may need a  Td booster every 10 years.  Varicella vaccine. You may need this if you have not been vaccinated.  Zoster vaccine. You may need this after age 33.  Measles, mumps, and rubella (MMR) vaccine. You may need at least one dose of MMR if you were born in 1957 or later. You may also need a second dose.  Pneumococcal 13-valent conjugate (PCV13) vaccine. You may need this if you have certain conditions and were not previously vaccinated.  Pneumococcal polysaccharide (PPSV23) vaccine. You may need one or two doses if you smoke cigarettes or if you have certain conditions.  Meningococcal vaccine. You may need this if you have certain conditions.  Hepatitis A vaccine. You may need this if you have certain conditions or if you travel or work in places where you may be exposed to hepatitis A.  Hepatitis B  vaccine. You may need this if you have certain conditions or if you travel or work in places where you may be exposed to hepatitis B.  Haemophilus influenzae type b (Hib) vaccine. You may need this if you have certain conditions.  Talk to your health care provider about which screenings and vaccines you need and how often you need them. This information is not intended to replace advice given to you by your health care provider. Make sure you discuss any questions you have with your health care provider. Document Released: 12/23/2015 Document Revised: 08/15/2016 Document Reviewed: 09/27/2015 Elsevier Interactive Patient Education  [employer].

## 2018-03-11 NOTE — Assessment & Plan Note (Signed)
Depression screen negative. Health Maintenance reviewed. Preventive schedule discussed and handout given in AVS. Will obtain fasting labs today.  

## 2018-03-11 NOTE — Assessment & Plan Note (Signed)
GERD diet reviewed. Encouraged continued exercise for weight loss. Will start Zantac BID PRN.

## 2018-03-12 ENCOUNTER — Telehealth: Payer: Self-pay

## 2018-03-12 ENCOUNTER — Other Ambulatory Visit: Payer: Self-pay | Admitting: Physician Assistant

## 2018-03-12 DIAGNOSIS — E669 Obesity, unspecified: Secondary | ICD-10-CM

## 2018-03-12 DIAGNOSIS — E1169 Type 2 diabetes mellitus with other specified complication: Principal | ICD-10-CM

## 2018-03-12 DIAGNOSIS — I10 Essential (primary) hypertension: Principal | ICD-10-CM

## 2018-03-12 DIAGNOSIS — E1159 Type 2 diabetes mellitus with other circulatory complications: Principal | ICD-10-CM

## 2018-03-12 LAB — HIV ANTIBODY (ROUTINE TESTING W REFLEX): HIV 1&2 Ab, 4th Generation: NONREACTIVE

## 2018-03-12 NOTE — Telephone Encounter (Signed)
Copied from [place] [phone]. Topic: Quick Communication - See Telephone Encounter >> Mar 12, 2018  9:40 AM Antonieta Iba C wrote: CRM for notification. See Telephone encounter for: 03/12/18.  Pt says that she is returning [place] phone call. CB:   [phone]   Called patient back and gave her the lab results note from [facility] about the Weight Loss Options and she stated that she would like to try the Weight Loss Specialist first to see if this helps and she stated that either [facility] or [facility] will be fine as long as she has some insurance coverage.

## 2018-03-27 ENCOUNTER — Other Ambulatory Visit: Payer: Self-pay | Admitting: Physician Assistant

## 2018-03-27 NOTE — Telephone Encounter (Signed)
This medication is not currently on medication list, was discontinued on 03/11/18 by Einar Pheasant.  Last OV 03/11/18, No Future OV

## 2018-04-08 ENCOUNTER — Ambulatory Visit
Admission: RE | Admit: 2018-04-08 | Discharge: 2018-04-08 | Disposition: A | Discharge status: Home / Self Care | Payer: BLUE CROSS/BLUE SHIELD | Source: Ambulatory Visit | Attending: Physician Assistant | Admitting: Physician Assistant

## 2018-04-08 DIAGNOSIS — Z1239 Encounter for other screening for malignant neoplasm of breast: Secondary | ICD-10-CM

## 2018-04-09 ENCOUNTER — Encounter: Payer: Self-pay | Admitting: Emergency Medicine

## 2018-04-15 ENCOUNTER — Encounter: Payer: BLUE CROSS/BLUE SHIELD | Attending: Physician Assistant | Admitting: Registered"

## 2018-04-15 DIAGNOSIS — E119 Type 2 diabetes mellitus without complications: Secondary | ICD-10-CM | POA: Insufficient documentation

## 2018-04-15 DIAGNOSIS — Z713 Dietary counseling and surveillance: Secondary | ICD-10-CM | POA: Insufficient documentation

## 2018-04-15 DIAGNOSIS — E1165 Type 2 diabetes mellitus with hyperglycemia: Secondary | ICD-10-CM

## 2018-04-15 DIAGNOSIS — E669 Obesity, unspecified: Secondary | ICD-10-CM | POA: Insufficient documentation

## 2018-04-15 DIAGNOSIS — I10 Essential (primary) hypertension: Secondary | ICD-10-CM | POA: Insufficient documentation

## 2018-04-15 HISTORY — DX: Type 2 diabetes mellitus with hyperglycemia: E11.65

## 2018-04-15 NOTE — Patient Instructions (Signed)
Consider eating breakfast more often and aim to eat balanced meals Consider continuing your omega 3 Consider thinking about more ways to get in more physical activity, swimming may be a good option for you.  Consider thinking about ways that will help you manage stress, especially with all the changes you have coming in your life. Consider checking your blood sugar at night when you are having night sweats. If it is below 70, use the rule of 15 to treat it. If you find that the night sweats are due to low blood sugar, you don't need to take the [facility]. Let your pharmacist know if you are taking Black Cohosh because it may have some interaction with other medications you are taking.

## 2018-04-15 NOTE — Progress Notes (Signed)
Diabetes Self-Management Education  Visit Type: First/Initial  Appt. Start Time: 0840 Appt. End Time: 1010  04/15/2018  [name], identified by name and date of birth, is a 57 y.o. female with a diagnosis of Diabetes: Type 2.   ASSESSMENT Patient states everyone in her family has DM and most were diagnosed fairly young in life. Patient states 2 years ago she did not have DM and 2 months after her yearly physical she was having to urinate every hour and went back to the doctor and her A1c was 13.1% Patient states about a year ago she went back to some old habits of soda and not eating right and despite all the medication she was still having BG in the 200s Pt states her BG has been stable for awhile with FBG in the 90s and bedtime BG 123-130 mg/dL. Pt states 2 weeks ago she cut out soda and no longer eats chips at night while watching TV.  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 Patient states she is applying for a bachelor's program now and plans to get a degree in criminal justice. 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 Patient states she has purchased a home with her mother and step father (55 yrs old) and in the process of helping them move here from [facility]. Pt states her step father needs extra caregiving now which she will be helping her mother provide.  Patient states she has been having night sweats and a friend recommended Black Cohosh which she is taking now and believes it may be helping.  Patient states she gets adequate sleep 7-8 hrs. Patient states she drinks coffee all day, but not for the energy boost. Patient reports regular BM, with occasional loose stools when eating certain foods due to cholecystectomy. Pt states she has started having GERD symptoms, but RD did not have time to address this issue in today's visit.  Patient states she has had some recent weight gain.  Patient states she does not get much physical activity due to pain, include L knee which she states she need to have replacement surgery. Pt states she also needs to have surgery on her neck to address nerve issues affecting her hands.   Diabetes Self-Management Education - 04/15/18 0902      Visit Information   Visit Type  First/Initial      Initial Visit   Diabetes Type  Type 2    Are you currently following a meal plan?  No    Are you taking your medications as prescribed?  Yes Jardiance, metformin, onglyza, tresiba 80 u nightly    Date Diagnosed  02/2016      Health Coping   How would you rate your overall health?  Fair      Psychosocial Assessment   Patient Belief/Attitude about Diabetes  Motivated to manage diabetes    How often do you need to have someone help you when you read instructions, pamphlets, or other written materials from your doctor or pharmacy?  1 - Never    What is the last grade level you completed in school?  Associates Degree      Complications   Last HgB A1C per patient/outside source  7.1 %    How often do you check your blood sugar?  1-2 times/day    Fasting Blood glucose range (mg/dL)  70-129 90's  Number of hypoglycemic episodes per month  0    Number of hyperglycemic episodes per week  0    Have you had a dilated eye exam in the past 12 months?  No    Have you had a dental exam in the past 12 months?  No    Are you checking your feet?  Yes    How many days per week are you checking your feet?  2      Dietary Intake   Breakfast  coffee with coffeemate ~3-4 Tbs    Snack (morning)  none    Lunch  salad, balsamic vinergarette OR grilled chicken bites OR skips ~3x week    Snack (afternoon)  salad OR wrap 2-3 pm    Dinner  baked chicken, salad spring mix, boiled egg, beets, cucumbers, little bit of cheese (Likes feta), berries OR Aldies Greek salad    Snack (evening)  none    Beverage(s)  water, coffee      Exercise   Exercise Type  ADL's    How  many days per week to you exercise?  0    How many minutes per day do you exercise?  0    Total minutes per week of exercise  0      Patient Education   Previous Diabetes Education  No    Nutrition management   Role of diet in the treatment of diabetes and the relationship between the three main macronutrients and blood glucose level;Carbohydrate counting    Physical activity and exercise   Role of exercise on diabetes management, blood pressure control and cardiac health.    Acute complications  Taught treatment of hypoglycemia - the 15 rule.    Psychosocial adjustment  Role of stress on diabetes      Outcomes   Expected Outcomes  Demonstrated interest in learning. Expect positive outcomes    Future DMSE  4-6 wks    Program Status  Completed     Individualized Plan for Diabetes Self-Management Training:   Learning Objective:  Patient will have a greater understanding of diabetes self-management. Patient education plan is to attend individual and/or group sessions per assessed needs and concerns.   Patient Instructions  Consider eating breakfast more often and aim to eat balanced meals Consider continuing your omega 3 Consider thinking about more ways to get in more physical activity, swimming may be a good option for you.  Consider thinking about ways that will help you manage stress, especially with all the changes you have coming in your life. Consider checking your blood sugar at night when you are having night sweats. If it is below 70, use the rule of 15 to treat it. If you find that the night sweats are due to low blood sugar, you don't need to take the [facility]. Let your pharmacist know if you are taking Black Cohosh because it may have some interaction with other medications you are taking.   Expected Outcomes:  Demonstrated interest in learning. Expect positive outcomes  Education material provided: My Plate, Snack sheet and Carbohydrate counting sheet, treating low  blood sugar.  If problems or questions, patient to contact team via:  Phone  Future DSME appointment: 4-6 wks

## 2018-04-28 ENCOUNTER — Other Ambulatory Visit: Payer: Self-pay

## 2018-04-28 ENCOUNTER — Telehealth: Payer: Self-pay | Admitting: Endocrinology

## 2018-04-28 DIAGNOSIS — E119 Type 2 diabetes mellitus without complications: Secondary | ICD-10-CM

## 2018-04-28 MED ORDER — SAXAGLIPTIN HCL 5 MG PO TABS: 5.0000 mg | ORAL_TABLET | Freq: Every day | ORAL | 11 refills | Status: DC

## 2018-04-28 MED ORDER — TRESIBA FLEXTOUCH 200 UNIT/ML ~~LOC~~ SOPN: 80.0000 [IU] | PEN_INJECTOR | Freq: Every day | SUBCUTANEOUS | 11 refills | Status: DC

## 2018-04-28 MED ORDER — METFORMIN HCL 500 MG PO TABS: 500.0000 mg | ORAL_TABLET | Freq: Two times a day (BID) | ORAL | 3 refills | Status: DC

## 2018-04-28 NOTE — Telephone Encounter (Signed)
These medications have been sent

## 2018-04-28 NOTE — Telephone Encounter (Signed)
metFORMIN (GLUCOPHAGE) 500 MG tablet  ONGLYZA 5 MG TABS tablet   TRESIBA FLEXTOUCH 200 UNIT/ML SOPN    Patient would like to know if we have coupons for her medication listed above,.  Please advise

## 2018-04-29 ENCOUNTER — Ambulatory Visit: Payer: BLUE CROSS/BLUE SHIELD | Admitting: Endocrinology

## 2018-05-13 LAB — HM DIABETES EYE EXAM

## 2018-05-19 ENCOUNTER — Other Ambulatory Visit: Payer: Self-pay | Admitting: Physician Assistant

## 2018-05-20 ENCOUNTER — Encounter: Payer: Self-pay | Attending: Physician Assistant | Admitting: Registered"

## 2018-05-20 ENCOUNTER — Other Ambulatory Visit: Payer: Self-pay

## 2018-05-20 ENCOUNTER — Telehealth: Payer: Self-pay | Admitting: Endocrinology

## 2018-05-20 DIAGNOSIS — E669 Obesity, unspecified: Secondary | ICD-10-CM | POA: Insufficient documentation

## 2018-05-20 DIAGNOSIS — E119 Type 2 diabetes mellitus without complications: Secondary | ICD-10-CM

## 2018-05-20 DIAGNOSIS — I10 Essential (primary) hypertension: Secondary | ICD-10-CM | POA: Insufficient documentation

## 2018-05-20 DIAGNOSIS — Z713 Dietary counseling and surveillance: Secondary | ICD-10-CM | POA: Insufficient documentation

## 2018-05-20 MED ORDER — TRESIBA FLEXTOUCH 200 UNIT/ML ~~LOC~~ SOPN: 80.0000 [IU] | PEN_INJECTOR | Freq: Every day | SUBCUTANEOUS | 11 refills | Status: DC

## 2018-05-20 MED ORDER — INSULIN PEN NEEDLE 32G X 4 MM MISC: 4 refills | Status: DC

## 2018-05-20 MED ORDER — METFORMIN HCL 500 MG PO TABS: 500.0000 mg | ORAL_TABLET | Freq: Two times a day (BID) | ORAL | 3 refills | Status: DC

## 2018-05-20 MED ORDER — GLUCOSE BLOOD VI STRP: ORAL_STRIP | 12 refills | Status: DC

## 2018-05-20 MED ORDER — SAXAGLIPTIN HCL 5 MG PO TABS: 5.0000 mg | ORAL_TABLET | Freq: Every day | ORAL | 11 refills | Status: DC

## 2018-05-20 NOTE — Patient Instructions (Signed)
Continue having balanced meals and snacks Great job on having breakfast When your life slows down, consider getting in regular movement Stress management is very important. Some resources: https://byrd-solis.org/ [employer] solutions may also be a resource to help with parents

## 2018-05-20 NOTE — Telephone Encounter (Signed)
I have sent all requested prescriptions to pharmacy.

## 2018-05-20 NOTE — Telephone Encounter (Signed)
Patients requests the following RX's be sent to [facility] on [address]: 1. BD Pen Needle nano 2. Metformin 3. Onglyza 4. Tyler Aas Flextouch 5. Contour Test Strips

## 2018-05-20 NOTE — Progress Notes (Signed)
Diabetes Self-Management Education  Visit Type: Follow-up  Appt. Start Time: 0830 Appt. End Time: 0900  05/20/2018  [name], identified by name and date of birth, is a 57 y.o. female with a diagnosis of Diabetes: Type 2.   ASSESSMENT Patient returns and reports she has lost 5 lbs. Patient states she is not taking Omega 3. Patient had questions about what foods are good to snack on. Patient reports a lot of life stress that has made it hard to focus on taking care of herself. Pt states living with her mother and step-dad is difficult and their attachment to stuff is hard for her to accept and live with (clutter?), as well as the medical issues she tries to help with.  Diabetes Self-Management Education - 05/20/18 0800      Visit Information   Visit Type  Follow-up      Initial Visit   Diabetes Type  Type 2      Complications   How often do you check your blood sugar?  1-2 times/day    Fasting Blood glucose range (mg/dL)  70-129      Dietary Intake   Breakfast  yogurt OR egg, coffee    Snack (morning)  none OR cheesits    Lunch  salad at fast food OR     Snack (afternoon)  no    Dinner  salad OR spaghetti, meat sauce    Snack (evening)  Chobani yogurt    Beverage(s)  water, coffee minute maid just 15      Patient Education   Nutrition management   Role of diet in the treatment of diabetes and the relationship between the three main macronutrients and blood glucose level      Individualized Goals (developed by patient)   Nutrition  General guidelines for healthy choices and portions discussed      Patient Self-Evaluation of Goals - Patient rates self as meeting previously set goals (% of time)   Nutrition  >75%    Physical Activity  < 25%      Outcomes   Expected Outcomes  Demonstrated interest in learning. Expect positive outcomes    Future DMSE  4-6 wks    Program Status  Not Completed      Subsequent Visit   Since your last visit have you continued or begun  to take your medications as prescribed?  Yes    Since your last visit have you experienced any weight changes?  Loss    Weight Loss (lbs)  5    Since your last visit, are you checking your blood glucose at least once a day?  Yes     Individualized Plan for Diabetes Self-Management Training:   Learning Objective:  Patient will have a greater understanding of diabetes self-management. Patient education plan is to attend individual and/or group sessions per assessed needs and concerns.   Patient Instructions  Continue having balanced meals and snacks Great job on having breakfast When your life slows down, consider getting in regular movement Stress management is very important. Some resources: https://byrd-solis.org/ [employer] may also be a resource to help with parents   Expected Outcomes:  Demonstrated interest in learning. Expect positive outcomes  Education material provided: Snack sheet  If problems or questions, patient to contact team via:  Phone and MyChart  Future DSME appointment: 4-6 wks

## 2018-05-26 ENCOUNTER — Other Ambulatory Visit: Payer: Self-pay | Admitting: Endocrinology

## 2018-06-10 ENCOUNTER — Ambulatory Visit: Payer: BLUE CROSS/BLUE SHIELD | Admitting: Endocrinology

## 2018-06-17 ENCOUNTER — Ambulatory Visit: Payer: Self-pay | Admitting: Registered"

## 2018-08-14 ENCOUNTER — Other Ambulatory Visit: Payer: Self-pay | Admitting: Physician Assistant

## 2018-09-06 ENCOUNTER — Other Ambulatory Visit: Payer: Self-pay | Admitting: Endocrinology

## 2018-09-06 NOTE — Telephone Encounter (Signed)
Please refill x 1 Ov is due  

## 2018-09-07 ENCOUNTER — Other Ambulatory Visit: Payer: Self-pay | Admitting: Physician Assistant

## 2018-09-12 ENCOUNTER — Other Ambulatory Visit: Payer: Self-pay | Admitting: Physician Assistant

## 2018-09-12 DIAGNOSIS — M62838 Other muscle spasm: Secondary | ICD-10-CM

## 2018-10-02 ENCOUNTER — Other Ambulatory Visit: Payer: Self-pay | Admitting: Physician Assistant

## 2018-10-02 ENCOUNTER — Other Ambulatory Visit: Payer: Self-pay | Admitting: Endocrinology

## 2018-10-09 ENCOUNTER — Other Ambulatory Visit: Payer: Self-pay

## 2018-10-09 MED ORDER — GLIMEPIRIDE 4 MG PO TABS: 4.0000 mg | ORAL_TABLET | Freq: Every day | ORAL | 0 refills | Status: DC

## 2018-10-22 ENCOUNTER — Other Ambulatory Visit: Payer: Self-pay | Admitting: Endocrinology

## 2018-10-22 ENCOUNTER — Other Ambulatory Visit: Payer: Self-pay | Admitting: Physician Assistant

## 2018-10-22 DIAGNOSIS — E119 Type 2 diabetes mellitus without complications: Secondary | ICD-10-CM

## 2018-10-22 MED ORDER — BAYER CONTOUR USB W/DEVICE KIT: PACK | 0 refills | Status: DC

## 2018-10-22 MED ORDER — GLIMEPIRIDE 4 MG PO TABS: 4.0000 mg | ORAL_TABLET | Freq: Every day | ORAL | 0 refills | Status: DC

## 2018-10-24 NOTE — Telephone Encounter (Signed)
Please refill x 1 Ov is due  

## 2018-10-27 ENCOUNTER — Other Ambulatory Visit: Payer: Self-pay | Admitting: Physician Assistant

## 2018-10-27 ENCOUNTER — Other Ambulatory Visit: Payer: Self-pay | Admitting: Endocrinology

## 2018-10-27 DIAGNOSIS — E785 Hyperlipidemia, unspecified: Secondary | ICD-10-CM

## 2018-10-27 MED ORDER — BAYER CONTOUR USB W/DEVICE KIT: PACK | 0 refills | Status: DC

## 2018-10-28 MED ORDER — GLIMEPIRIDE 4 MG PO TABS: 4.0000 mg | ORAL_TABLET | Freq: Every day | ORAL | 0 refills | Status: DC

## 2018-11-03 ENCOUNTER — Ambulatory Visit (INDEPENDENT_AMBULATORY_CARE_PROVIDER_SITE_OTHER): Payer: Self-pay | Admitting: Endocrinology

## 2018-11-03 ENCOUNTER — Encounter: Payer: Self-pay | Admitting: Endocrinology

## 2018-11-03 VITALS — BP 116/72 | HR 83 | Ht 66.0 in | Wt 234.6 lb

## 2018-11-03 DIAGNOSIS — E119 Type 2 diabetes mellitus without complications: Secondary | ICD-10-CM

## 2018-11-03 LAB — POCT GLYCOSYLATED HEMOGLOBIN (HGB A1C): HEMOGLOBIN A1C: 8.2 % — AB (ref 4.0–5.6)

## 2018-11-03 MED ORDER — TRESIBA FLEXTOUCH 200 UNIT/ML ~~LOC~~ SOPN: 90.0000 [IU] | PEN_INJECTOR | Freq: Every day | SUBCUTANEOUS | 11 refills | Status: DC

## 2018-11-03 MED ORDER — MICROLET LANCETS MISC: 1 refills | Status: DC

## 2018-11-03 NOTE — Patient Instructions (Addendum)
I have sent 2 prescriptions to your pharmacy, to slightly increase the tresiba. Please continue the same other diabetes medications check your blood sugar twice a day.  vary the time of day when you check, between before the 3 meals, and at bedtime.  also check if you have symptoms of your blood sugar being too high or too low.  please keep a record of the readings and bring it to your next appointment here (or you can bring the meter itself).  You can write it on any piece of paper.  please call us sooner if your blood sugar goes below 70, or if you have a lot of readings over 200. Please come back for a follow-up appointment in 2 months.

## 2018-11-03 NOTE — Progress Notes (Signed)
Subjective:    Patient ID: [name], female    DOB: 1961-05-05, 57 y.o.   MRN: [id number]  HPI Pt returns for f/u of diabetes mellitus:  DM type: Insulin-requiring type 2.  Dx'ed: 2017, when she had a cbg of 400 on steroids.  Complications: none.  Therapy: insulin and 4 oral meds.   GDM: never.   DKA: never.  Severe hypoglycemia: never.   Pancreatitis: never.    Other: she is on qd insulin, due to noncompliance.   Interval history: Pt returns after a gap in her insurance.  She has been back on the meds x 3 months.  She says she never misses the insulin.  no cbg record, but states cbg's vary from 97-100's.  No recent steroids.   Past Medical History:  Diagnosis Date  . Anemia   . Arthritis    bilateral knees  . Bronchitis    hx of  . Fibroid tumor    Had partial hysterectomy  . Gallstones   . Migraine    hx of    Past Surgical History:  Procedure Laterality Date  . ABDOMINAL HYSTERECTOMY     partial   . boil removed     . CARPAL TUNNEL RELEASE  04-15-15  . CHOLECYSTECTOMY  Oct 2012  . ELBOW SURGERY  04-15-15  . HAND SURGERY  04-15-15  . Nerve damage  04-15-15  . TOTAL KNEE ARTHROPLASTY  08/01/2012   Procedure: TOTAL KNEE ARTHROPLASTY;  Surgeon: Augustin Schooling, MD;  Location: [facility];  Service: Orthopedics;  Laterality: Right;  RIGHT TOTAL KNEE ARTHROPLASTY  . TRIGGER FINGER RELEASE  04-15-15  . TUBAL LIGATION    . WISDOM TOOTH EXTRACTION      Social History   Socioeconomic History  . Marital status: Single    Spouse name: Not on file  . Number of children: Not on file  . Years of education: Not on file  . Highest education level: Not on file  Occupational History  . Not on file  Social Needs  . Financial resource strain: Not on file  . Food insecurity:    Worry: Not on file    Inability: Not on file  . Transportation needs:    Medical: Not on file    Non-medical: Not on file  Tobacco Use  . Smoking status: Former Smoker    Packs/day: 0.25    Years:  20.00    Pack years: 5.00    Types: Cigarettes    Last attempt to quit: 12/09/2009    Years since quitting: 8.9  . Smokeless tobacco: Never Used  Substance and Sexual Activity  . Alcohol use: Yes    Alcohol/week: 0.0 standard drinks    Comment: social  . Drug use: No  . Sexual activity: Not Currently  Lifestyle  . Physical activity:    Days per week: Not on file    Minutes per session: Not on file  . Stress: Not on file  Relationships  . Social connections:    Talks on phone: Not on file    Gets together: Not on file    Attends religious service: Not on file    Active member of club or organization: Not on file    Attends meetings of clubs or organizations: Not on file    Relationship status: Not on file  . Intimate partner violence:    Fear of current or ex partner: Not on file    Emotionally abused: Not on file  Physically abused: Not on file    Forced sexual activity: Not on file  Other Topics Concern  . Not on file  Social History Narrative  . Not on file    Current Outpatient Medications on File Prior to Visit  Medication Sig Dispense Refill  . atorvastatin (LIPITOR) 10 MG tablet TAKE 1 TABLET BY MOUTH EVERY DAY 30 tablet 1  . azelastine (ASTELIN) 0.1 % nasal spray Place 2 sprays into both nostrils 2 (two) times daily. Use in each nostril as directed 30 mL 12  . BAYER CONTOUR TEST test strip USE TWICE DAILY AS INSTRUCTED TO CHECK SUGARS. DX E. 11.9 100 each 5  . BLACK COHOSH EXTRACT PO Take by mouth.    . Cinnamon 500 MG capsule Take 1,000 mg by mouth 2 (two) times daily.    . cyclobenzaprine (FLEXERIL) 10 MG tablet TAKE 1 TABLET BY MOUTH AT BEDTIME 30 tablet 1  . Echinacea-Goldenseal (ECHINACEA COMB/GOLDEN SEAL PO) Take by mouth.    . empagliflozin (JARDIANCE) 10 MG TABS tablet Take 10 mg by mouth daily. 30 tablet 11  . fluocinonide cream (LIDEX) 5.32 % Apply 1 application topically 2 (two) times daily.    . fluticasone (FLONASE) 50 MCG/ACT nasal spray Place 2  sprays into both nostrils daily. 16 g 2  . gabapentin (NEURONTIN) 300 MG capsule TAKE 1 CAPSULE BY MOUTH THREE TIMES A DAY 270 capsule 1  . glimepiride (AMARYL) 4 MG tablet Take 1 tablet (4 mg total) by mouth daily before breakfast. 90 tablet 0  . GLUCOSAMINE PO Take 1 tablet by mouth 2 (two) times daily.    Marland Kitchen glucose blood (CONTOUR NEXT TEST) test strip Used to check blood sugars 2x daily. DX CODE E11.9. 100 each 12  . Insulin Pen Needle (BD PEN NEEDLE NANO U/F) 32G X 4 MM MISC Used to give daily insulin injections. 100 each 4  . levocetirizine (XYZAL) 5 MG tablet TAKE 1 TABLET BY MOUTH EVERY DAY IN THE EVENING 90 tablet 1  . loratadine (CLARITIN) 10 MG tablet TAKE 1 TABLET BY MOUTH EVERY DAY 30 tablet 2  . losartan-hydrochlorothiazide (HYZAAR) 50-12.5 MG tablet TAKE 1 TABLET BY MOUTH EVERY DAY 90 tablet 1  . metFORMIN (GLUCOPHAGE) 500 MG tablet Take 1 tablet (500 mg total) by mouth 2 (two) times daily with a meal. 180 tablet 3  . Misc Natural Products (HERBAL ENERGY COMPLEX PO) Take by mouth 2 (two) times daily with a meal.    . Multiple Vitamins-Minerals (WOMENS MULTIVITAMIN PO) Take by mouth daily.    . Omega-3 1000 MG CAPS Take 1 g by mouth daily.    . ranitidine (ZANTAC) 150 MG capsule Take 1 capsule (150 mg total) by mouth 2 (two) times daily as needed for heartburn. 60 capsule 1  . saxagliptin HCl (ONGLYZA) 5 MG TABS tablet Take 1 tablet (5 mg total) by mouth daily. 30 tablet 11  . vitamin B-12 (CYANOCOBALAMIN) 100 MCG tablet Take 100 mcg by mouth daily.    . vitamin C (ASCORBIC ACID) 500 MG tablet Take 1,000 mg by mouth daily.    . vitamin E 400 UNIT capsule Take 400 Units by mouth daily. Reported on 02/13/2016     No current facility-administered medications on file prior to visit.     Allergies  Allergen Reactions  . Penicillins Hives    Hives   . Tramadol Itching    Family History  Problem Relation Age of Onset  . Diabetes Father 44  Deceased  . Diabetes Mother         Living  . Hyperlipidemia Mother   . Tuberculosis Mother   . Asthma Mother   . Heart disease Maternal Uncle   . Colon cancer Maternal Uncle        dx in 1's  . Diabetes Brother   . Heart disease Maternal Aunt   . Arthritis Other        Maternal Aunts & Uncles  . Diabetes Sister   . Kidney failure Brother 30       Diseased  . Healthy Son        x1  . Breast cancer Cousin 65  . Esophageal cancer Neg Hx   . Rectal cancer Neg Hx   . Stomach cancer Neg Hx     BP 116/72 (BP Location: Right Arm, Patient Position: Sitting, Cuff Size: Large)   Pulse 83   Ht 5\' 6"  (1.676 m)   Wt 234 lb 9.6 oz (106.4 kg)   SpO2 96%   BMI 37.87 kg/m   Review of Systems She denies hypoglycemia    Objective:   Physical Exam VITAL SIGNS:  See vs page GENERAL: no distress Pulses: dorsalis pedis intact bilat.   MSK: no deformity of the feet.   CV: no leg edema.   Skin:  no ulcer on the feet.  normal color and temp on the feet.   Neuro: sensation is intact to touch on the feet.   Ext: There is bilateral onychomycosis of the toenails.     Lab Results  Component Value Date   HGBA1C 8.2 (A) 11/03/2018   Lab Results  Component Value Date   CREATININE 0.82 03/11/2018   BUN 17 03/11/2018   NA 139 03/11/2018   K 4.3 03/11/2018   CL 104 03/11/2018   CO2 29 03/11/2018       Assessment & Plan:  Insulin-requiring type 2 DM: worse.   Polypharmacy: the glimepiride is redundant with insulin.  We discussed.  She declines to reduce or stop glimepiride now.     Patient Instructions  I have sent 2 prescriptions to your pharmacy, to slightly increase the tresiba. Please continue the same other diabetes medications check your blood sugar twice a day.  vary the time of day when you check, between before the 3 meals, and at bedtime.  also check if you have symptoms of your blood sugar being too high or too low.  please keep a record of the readings and bring it to your next appointment here (or you can  bring the meter itself).  You can write it on any piece of paper.  please call us sooner if your blood sugar goes below 70, or if you have a lot of readings over 200. Please come back for a follow-up appointment in 2 months.

## 2018-11-05 ENCOUNTER — Ambulatory Visit: Payer: PRIVATE HEALTH INSURANCE | Admitting: Physician Assistant

## 2018-11-05 ENCOUNTER — Other Ambulatory Visit: Payer: Self-pay

## 2018-11-05 ENCOUNTER — Encounter: Payer: Self-pay | Admitting: Physician Assistant

## 2018-11-05 VITALS — BP 100/80 | HR 98 | Temp 98.3°F | Resp 16 | Ht 66.0 in | Wt 225.0 lb

## 2018-11-05 DIAGNOSIS — M5412 Radiculopathy, cervical region: Secondary | ICD-10-CM | POA: Diagnosis not present

## 2018-11-05 DIAGNOSIS — K219 Gastro-esophageal reflux disease without esophagitis: Secondary | ICD-10-CM | POA: Diagnosis not present

## 2018-11-05 MED ORDER — FAMOTIDINE 20 MG PO TABS: 20.0000 mg | ORAL_TABLET | Freq: Two times a day (BID) | ORAL | 3 refills | Status: DC

## 2018-11-05 MED ORDER — MELOXICAM 15 MG PO TABS: 15.0000 mg | ORAL_TABLET | Freq: Every day | ORAL | 2 refills | Status: DC

## 2018-11-05 NOTE — Patient Instructions (Signed)
Please keep hydrated. Keep active and keep a well-balanced diet.  Please stop the Ranitidine and start the Pepcid as directed. The Meloxicam is to use as directed if needed for flares of knee pain.   Continue other medications as directed.   Schedule your complete physical when due. The ladies at the front can help with this.

## 2018-11-05 NOTE — Progress Notes (Signed)
Patient presents to clinic today for medication management. Patient was told by pharmacist that she should come in to discuss alternatives to her Ranitidine giving recent issues with medication. She takes this medication on an as-needed basis, usually 1-2 x week max depending on what she has had to eat. Has not taken anything else for this before OTC.    Past Medical History:  Diagnosis Date  . Anemia   . Arthritis    bilateral knees  . Bronchitis    hx of  . Fibroid tumor    Had partial hysterectomy  . Gallstones   . Migraine    hx of    Current Outpatient Medications on File Prior to Visit  Medication Sig Dispense Refill  . atorvastatin (LIPITOR) 10 MG tablet TAKE 1 TABLET BY MOUTH EVERY DAY 30 tablet 1  . azelastine (ASTELIN) 0.1 % nasal spray Place 2 sprays into both nostrils 2 (two) times daily. Use in each nostril as directed 30 mL 12  . BAYER CONTOUR TEST test strip USE TWICE DAILY AS INSTRUCTED TO CHECK SUGARS. DX E. 11.9 100 each 5  . BLACK COHOSH EXTRACT PO Take by mouth.    . Cinnamon 500 MG capsule Take 1,000 mg by mouth 2 (two) times daily.    . cyclobenzaprine (FLEXERIL) 10 MG tablet TAKE 1 TABLET BY MOUTH AT BEDTIME 30 tablet 1  . Echinacea-Goldenseal (ECHINACEA COMB/GOLDEN SEAL PO) Take by mouth.    . empagliflozin (JARDIANCE) 10 MG TABS tablet Take 10 mg by mouth daily. 30 tablet 11  . fluocinonide cream (LIDEX) 0.10 % Apply 1 application topically 2 (two) times daily.    . fluticasone (FLONASE) 50 MCG/ACT nasal spray Place 2 sprays into both nostrils daily. 16 g 2  . gabapentin (NEURONTIN) 300 MG capsule TAKE 1 CAPSULE BY MOUTH THREE TIMES A DAY 270 capsule 1  . glimepiride (AMARYL) 4 MG tablet Take 1 tablet (4 mg total) by mouth daily before breakfast. 90 tablet 0  . GLUCOSAMINE PO Take 1 tablet by mouth 2 (two) times daily.    Marland Kitchen glucose blood (CONTOUR NEXT TEST) test strip Used to check blood sugars 2x daily. DX CODE E11.9. 100 each 12  . Insulin Pen Needle (BD  PEN NEEDLE NANO U/F) 32G X 4 MM MISC Used to give daily insulin injections. 100 each 4  . levocetirizine (XYZAL) 5 MG tablet TAKE 1 TABLET BY MOUTH EVERY DAY IN THE EVENING 90 tablet 1  . loratadine (CLARITIN) 10 MG tablet TAKE 1 TABLET BY MOUTH EVERY DAY 30 tablet 2  . losartan-hydrochlorothiazide (HYZAAR) 50-12.5 MG tablet TAKE 1 TABLET BY MOUTH EVERY DAY 90 tablet 1  . metFORMIN (GLUCOPHAGE) 500 MG tablet Take 1 tablet (500 mg total) by mouth 2 (two) times daily with a meal. 180 tablet 3  . MICROLET LANCETS MISC Use to monitor glucose levels BID 100 each 1  . Misc Natural Products (HERBAL ENERGY COMPLEX PO) Take by mouth 2 (two) times daily with a meal.    . Multiple Vitamins-Minerals (WOMENS MULTIVITAMIN PO) Take by mouth daily.    . Omega-3 1000 MG CAPS Take 1 g by mouth daily.    . saxagliptin HCl (ONGLYZA) 5 MG TABS tablet Take 1 tablet (5 mg total) by mouth daily. 30 tablet 11  . TRESIBA FLEXTOUCH 200 UNIT/ML SOPN Inject 90 Units into the skin daily. 6 pen 11  . vitamin B-12 (CYANOCOBALAMIN) 100 MCG tablet Take 100 mcg by mouth daily.    Marland Kitchen  vitamin C (ASCORBIC ACID) 500 MG tablet Take 1,000 mg by mouth daily.    . vitamin E 400 UNIT capsule Take 400 Units by mouth daily. Reported on 02/13/2016     No current facility-administered medications on file prior to visit.     Allergies  Allergen Reactions  . Penicillins Hives    Hives   . Tramadol Itching    Family History  Problem Relation Age of Onset  . Diabetes Father 75       Deceased  . Diabetes Mother        Living  . Hyperlipidemia Mother   . Tuberculosis Mother   . Asthma Mother   . Heart disease Maternal Uncle   . Colon cancer Maternal Uncle        dx in 36's  . Diabetes Brother   . Heart disease Maternal Aunt   . Arthritis Other        Maternal Aunts & Uncles  . Diabetes Sister   . Kidney failure Brother 30       Diseased  . Healthy Son        x1  . Breast cancer Cousin 10  . Esophageal cancer Neg Hx   .  Rectal cancer Neg Hx   . Stomach cancer Neg Hx     Social History   Socioeconomic History  . Marital status: Single    Spouse name: Not on file  . Number of children: Not on file  . Years of education: Not on file  . Highest education level: Not on file  Occupational History  . Not on file  Social Needs  . Financial resource strain: Not on file  . Food insecurity:    Worry: Not on file    Inability: Not on file  . Transportation needs:    Medical: Not on file    Non-medical: Not on file  Tobacco Use  . Smoking status: Former Smoker    Packs/day: 0.25    Years: 20.00    Pack years: 5.00    Types: Cigarettes    Last attempt to quit: 12/09/2009    Years since quitting: 8.9  . Smokeless tobacco: Never Used  Substance and Sexual Activity  . Alcohol use: Yes    Alcohol/week: 0.0 standard drinks    Comment: social  . Drug use: No  . Sexual activity: Not Currently  Lifestyle  . Physical activity:    Days per week: Not on file    Minutes per session: Not on file  . Stress: Not on file  Relationships  . Social connections:    Talks on phone: Not on file    Gets together: Not on file    Attends religious service: Not on file    Active member of club or organization: Not on file    Attends meetings of clubs or organizations: Not on file    Relationship status: Not on file  Other Topics Concern  . Not on file  Social History Narrative  . Not on file   Review of Systems - See HPI.  All other ROS are negative.  BP 100/80   Pulse 98   Temp 98.3 F (36.8 C) (Oral)   Resp 16   Ht 5\' 6"  (1.676 m)   Wt 225 lb (102.1 kg)   SpO2 98%   BMI 36.32 kg/m   Physical Exam  Constitutional: She appears well-developed and well-nourished.  HENT:  Head: Normocephalic and atraumatic.  Neck: Neck supple.  Cardiovascular:  Normal rate, regular rhythm, normal heart sounds and intact distal pulses.  Pulmonary/Chest: Effort normal and breath sounds normal.  Psychiatric: She has a  normal mood and affect.  Vitals reviewed.  Recent Results (from the past 2160 hour(s))  POCT HgB A1C     Status: Abnormal   Collection Time: 11/03/18  3:17 PM  Result Value Ref Range   Hemoglobin A1C 8.2 (A) 4.0 - 5.6 %   HbA1c POC (<> result, manual entry)     HbA1c, POC (prediabetic range)     HbA1c, POC (controlled diabetic range)      Assessment/Plan: 1. Cervical radiculopathy Will given short course of Mobic to have on hand if needed for flares of neck or knee pain. Follow-up with specialist as scheduled. - meloxicam (MOBIC) 15 MG tablet; Take 1 tablet (15 mg total) by mouth daily.  Dispense: 30 tablet; Refill: 2  2. Gastroesophageal reflux disease without esophagitis Rare symptoms. Only with certain foods she tries to avoid. Stop Rantidine. Rx Pepcid to use if needed. GERD diet reviewed.    Leeanne Rio, PA-C

## 2018-11-10 ENCOUNTER — Encounter: Payer: Self-pay | Admitting: Emergency Medicine

## 2018-11-16 ENCOUNTER — Other Ambulatory Visit: Payer: Self-pay | Admitting: Physician Assistant

## 2018-11-16 DIAGNOSIS — M62838 Other muscle spasm: Secondary | ICD-10-CM

## 2018-11-19 ENCOUNTER — Ambulatory Visit: Payer: PRIVATE HEALTH INSURANCE | Admitting: Registered"

## 2018-11-21 ENCOUNTER — Encounter: Payer: PRIVATE HEALTH INSURANCE | Attending: Physician Assistant | Admitting: Registered"

## 2018-11-21 DIAGNOSIS — E1165 Type 2 diabetes mellitus with hyperglycemia: Secondary | ICD-10-CM | POA: Diagnosis not present

## 2018-11-21 NOTE — Progress Notes (Signed)
Diabetes Self-Management Education  Visit Type: Follow-up  Appt. Start Time: 1130 Appt. End Time: 1210  11/24/2018  [name], identified by name and date of birth, is a 57 y.o. female with a diagnosis of Diabetes: Type 2.   Patient arrived ~15 min late, but was not asked to reschedule per [facility] 10 min check-in policy.  ASSESSMENT Pt states her A1c increased from 7.1% to 8.2% due to stopping her medication for a couple of months after she changed employment and had to wait for new insurance cards. Pt states she has coupons from her MD which helps her afford the medications. Pt states she is checking BG 2x day with the lowest FBG 86 and may go up to 140s when she forgets to take evening meds.  Pt states she works 101 hrs/week and is still in school, but she will be done with classes in Jan. Pt states she does not have time or energy to exercise. Pt states this morning her mother made pancakes for breakfast and had with regular syrup. Pt reports that she usually gets something quick she can eat in the car such as yogurt or poptarts. Pt forgot that she should eat protein when eating fruit.  Pt states living with her mother and step-dad continues to create stress for her because she has not been able to get settled in the home due to no space for her things as well as helping with caregiving.   Diabetes Self-Management Education - 11/21/18 1133      Visit Information   Visit Type  Follow-up      Initial Visit   Diabetes Type  Type 2    Are you taking your medications as prescribed?  Yes   Jardiance, glimepiride, onglyza, Tresiba, metformin     Complications   Last HgB A1C per patient/outside source  8.2 %    How often do you check your blood sugar?  1-2 times/day    Fasting Blood glucose range (mg/dL)  70-129;130-179    Number of hypoglycemic episodes per month  0      Dietary Intake   Breakfast  2 pancakes with regular syrup    Snack (morning)  none OR granola snacks    Lunch  fish sandwich at [employer], sm fry, water    Snack (afternoon)  none    Dinner  steak, sweet potato, broccoli (long horn),     Beverage(s)  water, soda every now and then, coffee,       Individualized Goals (developed by patient)   Nutrition  General guidelines for healthy choices and portions discussed    Physical Activity  --   increase NEAT activity   Medications  take my medication as prescribed      Outcomes   Expected Outcomes  Demonstrated interest in learning. Expect positive outcomes    Future DMSE  2 months    Program Status  Completed      Subsequent Visit   Since your last visit have you continued or begun to take your medications as prescribed?  Yes   had stopped for 2 months due to lack of insurance     Individualized Plan for Diabetes Self-Management Training:   Learning Objective:  Patient will have a greater understanding of diabetes self-management. Patient education plan is to attend individual and/or group sessions per assessed needs and concerns.   Patient Instructions  Consider getting in more movement with your daily activities Sat and Tues when with shopping with clients aim  to get extra steps in. [facility] continue get up and move around, also consider doing some stretching. Work on getting in breakfast in daily Look for "100% whole" for the first ingredient When using the oatmeal bread, use protein to go with it When you want to have apple butter, use the bread with less carbs such as Nature's Own 100% whole wheat Premier protein is an option too, 1/2 will give you enough protein to balance the carbs Continue have the salads with protein and variety of vegetable  Expected Outcomes:  Demonstrated interest in learning. Expect positive outcomes  Education material provided: Snack sheet  If problems or questions, patient to contact team via:  Phone  Future DSME appointment: 2 months

## 2018-11-21 NOTE — Patient Instructions (Addendum)
Consider getting in more movement with your daily activities Sat and Tues when with shopping with clients aim to get extra steps in. [facility] continue get up and move around, also consider doing some stretching. Work on getting in breakfast in daily Look for "100% whole" for the first ingredient When using the oatmeal bread, use protein to go with it When you want to have apple butter, use the bread with less carbs such as Nature's Own 100% whole wheat Premier protein is an option too, 1/2 will give you enough protein to balance the carbs Continue have the salads with protein and variety of vegetable

## 2018-11-24 ENCOUNTER — Encounter: Payer: Self-pay | Admitting: Registered"

## 2018-12-22 ENCOUNTER — Other Ambulatory Visit: Payer: Self-pay

## 2018-12-22 ENCOUNTER — Encounter: Payer: Self-pay | Admitting: Physician Assistant

## 2018-12-22 ENCOUNTER — Ambulatory Visit: Payer: PRIVATE HEALTH INSURANCE | Admitting: Physician Assistant

## 2018-12-22 VITALS — BP 120/74 | HR 104 | Temp 98.2°F | Resp 16 | Ht 66.0 in | Wt 229.0 lb

## 2018-12-22 DIAGNOSIS — E785 Hyperlipidemia, unspecified: Secondary | ICD-10-CM | POA: Diagnosis not present

## 2018-12-22 DIAGNOSIS — M545 Low back pain, unspecified: Secondary | ICD-10-CM

## 2018-12-22 DIAGNOSIS — K219 Gastro-esophageal reflux disease without esophagitis: Secondary | ICD-10-CM

## 2018-12-22 MED ORDER — NAPROXEN 500 MG PO TABS: 500.0000 mg | ORAL_TABLET | Freq: Two times a day (BID) | ORAL | 0 refills | Status: DC

## 2018-12-22 MED ORDER — ATORVASTATIN CALCIUM 10 MG PO TABS: 10.0000 mg | ORAL_TABLET | Freq: Every day | ORAL | 1 refills | Status: DC

## 2018-12-22 MED ORDER — OMEPRAZOLE 20 MG PO CPDR: 20.0000 mg | DELAYED_RELEASE_CAPSULE | Freq: Every day | ORAL | 3 refills | Status: DC

## 2018-12-22 NOTE — Patient Instructions (Signed)
Please keep well-hydrated and get plenty of rest. Avoid heavy lifting or overexertion. Apply heating pad to the lower back for 10-15 minutes, a few times per day. Stop the Meloxicam for now. Take the naproxen twice daily with food. Can use Tylenol Arthritis for breakthrough pain. You can increase your cyclobenzaprine to three times daily but no driving or operating heavy machinery while on this medication.   Follow-up if symptoms are not easing up in the next 3 days or so, or if anything worsens.

## 2018-12-22 NOTE — Progress Notes (Signed)
Acute Office Visit  Subjective:    Patient ID: [name], female    DOB: 29-Mar-1961, 58 y.o.   MRN: [id number]  Chief Complaint  Patient presents with  . Back Pain   Patient is in today for right-sided low back pain with radiation into R gluteal region. Notes symptom started after working at home - mopping floors, dusting, etc. Denies recent heavy lifting or overexertion. Denies radiation further than gluteal region. Denies weakness, numbness or tingling of extremity. Denies saddle anesthesia or change to bowel/bladder habits. Has been taking her Gabapentin and Meloxicam as directed which help but only reduce pain from 8/10-6/10.   Past Medical History:  Diagnosis Date  . Anemia   . Arthritis    bilateral knees  . Bronchitis    hx of  . Fibroid tumor    Had partial hysterectomy  . Gallstones   . Migraine    hx of    Past Surgical History:  Procedure Laterality Date  . ABDOMINAL HYSTERECTOMY     partial   . boil removed     . CARPAL TUNNEL RELEASE  04-15-15  . CHOLECYSTECTOMY  Oct 2012  . ELBOW SURGERY  04-15-15  . HAND SURGERY  04-15-15  . Nerve damage  04-15-15  . TOTAL KNEE ARTHROPLASTY  08/01/2012   Procedure: TOTAL KNEE ARTHROPLASTY;  Surgeon: Augustin Schooling, MD;  Location: [facility];  Service: Orthopedics;  Laterality: Right;  RIGHT TOTAL KNEE ARTHROPLASTY  . TRIGGER FINGER RELEASE  04-15-15  . TUBAL LIGATION    . WISDOM TOOTH EXTRACTION      Family History  Problem Relation Age of Onset  . Diabetes Father 61       Deceased  . Diabetes Mother        Living  . Hyperlipidemia Mother   . Tuberculosis Mother   . Asthma Mother   . Heart disease Maternal Uncle   . Colon cancer Maternal Uncle        dx in 1's  . Diabetes Brother   . Heart disease Maternal Aunt   . Arthritis Other        Maternal Aunts & Uncles  . Diabetes Sister   . Kidney failure Brother 30       Diseased  . Healthy Son        x1  . Breast cancer Cousin 65  . Esophageal cancer Neg Hx   .  Rectal cancer Neg Hx   . Stomach cancer Neg Hx     Social History   Socioeconomic History  . Marital status: Single    Spouse name: Not on file  . Number of children: Not on file  . Years of education: Not on file  . Highest education level: Not on file  Occupational History  . Not on file  Social Needs  . Financial resource strain: Not on file  . Food insecurity:    Worry: Not on file    Inability: Not on file  . Transportation needs:    Medical: Not on file    Non-medical: Not on file  Tobacco Use  . Smoking status: Former Smoker    Packs/day: 0.25    Years: 20.00    Pack years: 5.00    Types: Cigarettes    Last attempt to quit: 12/09/2009    Years since quitting: 9.0  . Smokeless tobacco: Never Used  Substance and Sexual Activity  . Alcohol use: Yes    Alcohol/week: 0.0 standard drinks  Comment: social  . Drug use: No  . Sexual activity: Not Currently  Lifestyle  . Physical activity:    Days per week: Not on file    Minutes per session: Not on file  . Stress: Not on file  Relationships  . Social connections:    Talks on phone: Not on file    Gets together: Not on file    Attends religious service: Not on file    Active member of club or organization: Not on file    Attends meetings of clubs or organizations: Not on file    Relationship status: Not on file  . Intimate partner violence:    Fear of current or ex partner: Not on file    Emotionally abused: Not on file    Physically abused: Not on file    Forced sexual activity: Not on file  Other Topics Concern  . Not on file  Social History Narrative  . Not on file    Outpatient Medications Prior to Visit  Medication Sig Dispense Refill  . azelastine (ASTELIN) 0.1 % nasal spray Place 2 sprays into both nostrils 2 (two) times daily. Use in each nostril as directed 30 mL 12  . BAYER CONTOUR TEST test strip USE TWICE DAILY AS INSTRUCTED TO CHECK SUGARS. DX E. 11.9 100 each 5  . BLACK COHOSH EXTRACT PO  Take by mouth.    . Cinnamon 500 MG capsule Take 1,000 mg by mouth 2 (two) times daily.    . cyclobenzaprine (FLEXERIL) 10 MG tablet TAKE 1 TABLET BY MOUTH AT BEDTIME 30 tablet 1  . Echinacea-Goldenseal (ECHINACEA COMB/GOLDEN SEAL PO) Take by mouth.    . empagliflozin (JARDIANCE) 10 MG TABS tablet Take 10 mg by mouth daily. 30 tablet 11  . famotidine (PEPCID) 20 MG tablet Take 1 tablet (20 mg total) by mouth 2 (two) times daily. 60 tablet 3  . fluocinonide cream (LIDEX) 6.07 % Apply 1 application topically 2 (two) times daily.    . fluticasone (FLONASE) 50 MCG/ACT nasal spray Place 2 sprays into both nostrils daily. 16 g 2  . gabapentin (NEURONTIN) 300 MG capsule TAKE 1 CAPSULE BY MOUTH THREE TIMES A DAY 270 capsule 1  . glimepiride (AMARYL) 4 MG tablet Take 1 tablet (4 mg total) by mouth daily before breakfast. 90 tablet 0  . GLUCOSAMINE PO Take 1 tablet by mouth 2 (two) times daily.    Marland Kitchen glucose blood (CONTOUR NEXT TEST) test strip Used to check blood sugars 2x daily. DX CODE E11.9. 100 each 12  . Insulin Pen Needle (BD PEN NEEDLE NANO U/F) 32G X 4 MM MISC Used to give daily insulin injections. 100 each 4  . levocetirizine (XYZAL) 5 MG tablet TAKE 1 TABLET BY MOUTH EVERY DAY IN THE EVENING 90 tablet 1  . losartan-hydrochlorothiazide (HYZAAR) 50-12.5 MG tablet TAKE 1 TABLET BY MOUTH EVERY DAY 90 tablet 1  . metFORMIN (GLUCOPHAGE) 500 MG tablet Take 1 tablet (500 mg total) by mouth 2 (two) times daily with a meal. 180 tablet 3  . MICROLET LANCETS MISC Use to monitor glucose levels BID 100 each 1  . Misc Natural Products (HERBAL ENERGY COMPLEX PO) Take by mouth 2 (two) times daily with a meal.    . Multiple Vitamins-Minerals (WOMENS MULTIVITAMIN PO) Take by mouth daily.    . Omega-3 1000 MG CAPS Take 1 g by mouth daily.    . saxagliptin HCl (ONGLYZA) 5 MG TABS tablet Take 1 tablet (5 mg total)  by mouth daily. 30 tablet 11  . TRESIBA FLEXTOUCH 200 UNIT/ML SOPN Inject 90 Units into the skin daily.  6 pen 11  . vitamin B-12 (CYANOCOBALAMIN) 100 MCG tablet Take 100 mcg by mouth daily.    . vitamin C (ASCORBIC ACID) 500 MG tablet Take 1,000 mg by mouth daily.    . vitamin E 400 UNIT capsule Take 400 Units by mouth daily. Reported on 02/13/2016    . atorvastatin (LIPITOR) 10 MG tablet TAKE 1 TABLET BY MOUTH EVERY DAY 30 tablet 1  . loratadine (CLARITIN) 10 MG tablet TAKE 1 TABLET BY MOUTH EVERY DAY 30 tablet 2  . meloxicam (MOBIC) 15 MG tablet Take 1 tablet (15 mg total) by mouth daily. 30 tablet 2   No facility-administered medications prior to visit.     Allergies  Allergen Reactions  . Penicillins Hives    Hives   . Tramadol Itching    Review of Systems  Respiratory: Negative for cough and shortness of breath.   Cardiovascular: Negative.   Gastrointestinal: Negative for constipation, diarrhea, nausea and vomiting.  Genitourinary: Negative.   Musculoskeletal: Positive for back pain.  Neurological: Negative for dizziness and headaches.       Objective:    Physical Exam  Constitutional: She is oriented to person, place, and time. She appears well-developed and well-nourished.  HENT:  Head: Normocephalic and atraumatic.  Cardiovascular: Regular rhythm and normal heart sounds. Tachycardia present.  Pulmonary/Chest: Effort normal and breath sounds normal.  Musculoskeletal:     Cervical back: Normal.     Thoracic back: Normal.     Lumbar back: She exhibits pain. She exhibits normal range of motion, no tenderness, no bony tenderness and no deformity.       Back:  Neurological: She is alert and oriented to person, place, and time. She has normal strength.    BP 120/74   Pulse (!) 104   Temp 98.2 F (36.8 C) (Oral)   Resp 16   Ht 5\' 6"  (1.676 m)   Wt 229 lb (103.9 kg)   SpO2 97%   BMI 36.96 kg/m  Wt Readings from Last 3 Encounters:  12/22/18 229 lb (103.9 kg)  11/05/18 225 lb (102.1 kg)  11/03/18 234 lb 9.6 oz (106.4 kg)    There are no preventive care reminders  to display for this patient.  There are no preventive care reminders to display for this patient.   Lab Results  Component Value Date   TSH 1.39 03/11/2018   Lab Results  Component Value Date   WBC 4.6 03/11/2018   HGB 12.0 03/11/2018   HCT 36.4 03/11/2018   MCV 89.0 03/11/2018   PLT 288.0 03/11/2018   Lab Results  Component Value Date   NA 139 03/11/2018   K 4.3 03/11/2018   CO2 29 03/11/2018   GLUCOSE 98 03/11/2018   BUN 17 03/11/2018   CREATININE 0.82 03/11/2018   BILITOT 0.3 03/11/2018   ALKPHOS 117 03/11/2018   AST 17 03/11/2018   ALT 17 03/11/2018   PROT 7.0 03/11/2018   ALBUMIN 4.1 03/11/2018   CALCIUM 9.9 03/11/2018   GFR 91.95 03/11/2018   Lab Results  Component Value Date   CHOL 142 03/11/2018   Lab Results  Component Value Date   HDL 60.70 03/11/2018   Lab Results  Component Value Date   LDLCALC 69 03/11/2018   Lab Results  Component Value Date   TRIG 61.0 03/11/2018   Lab Results  Component Value Date  CHOLHDL 2 03/11/2018   Lab Results  Component Value Date   HGBA1C 8.2 (A) 11/03/2018     Assessment & Plan:  1. Hyperlipidemia, unspecified hyperlipidemia type Due for refill. Refill sent. Will repeat Lipids and LFTS at [facility] in a couple of months.  - atorvastatin (LIPITOR) 10 MG tablet; Take 1 tablet (10 mg total) by mouth daily.  Dispense: 90 tablet; Refill: 1  2. Gastroesophageal reflux disease without esophagitis Restart Prilosec due to occasional breakthrough symptoms. Minimize NSAID use after flare of back pain is over.  3. Acute right-sided low back pain without sciatica RICE. Start heating pad. Start naprosyn BID. Tylenol for breakthrough pain. Flexeril as directed. Follow-up if not improving.     Leeanne Rio, PA-C

## 2019-01-06 ENCOUNTER — Encounter: Payer: Self-pay | Admitting: Endocrinology

## 2019-01-06 ENCOUNTER — Ambulatory Visit: Payer: PRIVATE HEALTH INSURANCE | Admitting: Endocrinology

## 2019-01-06 VITALS — BP 124/80 | HR 82 | Ht 66.0 in | Wt 231.0 lb

## 2019-01-06 DIAGNOSIS — E119 Type 2 diabetes mellitus without complications: Secondary | ICD-10-CM

## 2019-01-06 LAB — POCT GLYCOSYLATED HEMOGLOBIN (HGB A1C): HEMOGLOBIN A1C: 6.5 % — AB (ref 4.0–5.6)

## 2019-01-07 NOTE — Progress Notes (Signed)
Subjective:    Patient ID: [name], female    DOB: 03/28/1961, 58 y.o.   MRN: [id number]  HPI Pt returns for f/u of diabetes mellitus:  DM type: Insulin-requiring type 2.  Dx'ed: 2017, when she had a cbg of 400 on steroids.  Complications: none.  Therapy: insulin and 4 oral meds.   GDM: never.   DKA: never.  Severe hypoglycemia: never.   Pancreatitis: never.    Other: she is on qd insulin, due to noncompliance.   Interval history:  No recent steroids. Pt says her cbg is 47 now.  Meter is downloaded today, and the printout is scanned into the record.  There are 3 fasting cbg's (70-200).   Past Medical History:  Diagnosis Date  . Anemia   . Arthritis    bilateral knees  . Bronchitis    hx of  . Fibroid tumor    Had partial hysterectomy  . Gallstones   . Migraine    hx of    Past Surgical History:  Procedure Laterality Date  . ABDOMINAL HYSTERECTOMY     partial   . boil removed     . CARPAL TUNNEL RELEASE  04-15-15  . CHOLECYSTECTOMY  Oct 2012  . ELBOW SURGERY  04-15-15  . HAND SURGERY  04-15-15  . Nerve damage  04-15-15  . TOTAL KNEE ARTHROPLASTY  08/01/2012   Procedure: TOTAL KNEE ARTHROPLASTY;  Surgeon: Augustin Schooling, MD;  Location: [facility];  Service: Orthopedics;  Laterality: Right;  RIGHT TOTAL KNEE ARTHROPLASTY  . TRIGGER FINGER RELEASE  04-15-15  . TUBAL LIGATION    . WISDOM TOOTH EXTRACTION      Social History   Socioeconomic History  . Marital status: Single    Spouse name: Not on file  . Number of children: Not on file  . Years of education: Not on file  . Highest education level: Not on file  Occupational History  . Not on file  Social Needs  . Financial resource strain: Not on file  . Food insecurity:    Worry: Not on file    Inability: Not on file  . Transportation needs:    Medical: Not on file    Non-medical: Not on file  Tobacco Use  . Smoking status: Former Smoker    Packs/day: 0.25    Years: 20.00    Pack years: 5.00    Types:  Cigarettes    Last attempt to quit: 12/09/2009    Years since quitting: 9.0  . Smokeless tobacco: Never Used  Substance and Sexual Activity  . Alcohol use: Yes    Alcohol/week: 0.0 standard drinks    Comment: social  . Drug use: No  . Sexual activity: Not Currently  Lifestyle  . Physical activity:    Days per week: Not on file    Minutes per session: Not on file  . Stress: Not on file  Relationships  . Social connections:    Talks on phone: Not on file    Gets together: Not on file    Attends religious service: Not on file    Active member of club or organization: Not on file    Attends meetings of clubs or organizations: Not on file    Relationship status: Not on file  . Intimate partner violence:    Fear of current or ex partner: Not on file    Emotionally abused: Not on file    Physically abused: Not on file  Forced sexual activity: Not on file  Other Topics Concern  . Not on file  Social History Narrative  . Not on file    Current Outpatient Medications on File Prior to Visit  Medication Sig Dispense Refill  . atorvastatin (LIPITOR) 10 MG tablet Take 1 tablet (10 mg total) by mouth daily. 90 tablet 1  . azelastine (ASTELIN) 0.1 % nasal spray Place 2 sprays into both nostrils 2 (two) times daily. Use in each nostril as directed 30 mL 12  . BAYER CONTOUR TEST test strip USE TWICE DAILY AS INSTRUCTED TO CHECK SUGARS. DX E. 11.9 100 each 5  . BLACK COHOSH EXTRACT PO Take by mouth.    . Cinnamon 500 MG capsule Take 1,000 mg by mouth 2 (two) times daily.    . cyclobenzaprine (FLEXERIL) 10 MG tablet TAKE 1 TABLET BY MOUTH AT BEDTIME 30 tablet 1  . Echinacea-Goldenseal (ECHINACEA COMB/GOLDEN SEAL PO) Take by mouth.    . empagliflozin (JARDIANCE) 10 MG TABS tablet Take 10 mg by mouth daily. 30 tablet 11  . famotidine (PEPCID) 20 MG tablet Take 1 tablet (20 mg total) by mouth 2 (two) times daily. 60 tablet 3  . fluocinonide cream (LIDEX) 8.29 % Apply 1 application topically 2  (two) times daily.    . fluticasone (FLONASE) 50 MCG/ACT nasal spray Place 2 sprays into both nostrils daily. 16 g 2  . gabapentin (NEURONTIN) 300 MG capsule TAKE 1 CAPSULE BY MOUTH THREE TIMES A DAY 270 capsule 1  . glimepiride (AMARYL) 4 MG tablet Take 1 tablet (4 mg total) by mouth daily before breakfast. 90 tablet 0  . GLUCOSAMINE PO Take 1 tablet by mouth 2 (two) times daily.    Marland Kitchen glucose blood (CONTOUR NEXT TEST) test strip Used to check blood sugars 2x daily. DX CODE E11.9. 100 each 12  . Insulin Pen Needle (BD PEN NEEDLE NANO U/F) 32G X 4 MM MISC Used to give daily insulin injections. 100 each 4  . levocetirizine (XYZAL) 5 MG tablet TAKE 1 TABLET BY MOUTH EVERY DAY IN THE EVENING 90 tablet 1  . losartan-hydrochlorothiazide (HYZAAR) 50-12.5 MG tablet TAKE 1 TABLET BY MOUTH EVERY DAY 90 tablet 1  . metFORMIN (GLUCOPHAGE) 500 MG tablet Take 1 tablet (500 mg total) by mouth 2 (two) times daily with a meal. 180 tablet 3  . MICROLET LANCETS MISC Use to monitor glucose levels BID 100 each 1  . Misc Natural Products (HERBAL ENERGY COMPLEX PO) Take by mouth 2 (two) times daily with a meal.    . Multiple Vitamins-Minerals (WOMENS MULTIVITAMIN PO) Take by mouth daily.    . naproxen (NAPROSYN) 500 MG tablet Take 1 tablet (500 mg total) by mouth 2 (two) times daily with a meal. 20 tablet 0  . Omega-3 1000 MG CAPS Take 1 g by mouth daily.    Marland Kitchen omeprazole (PRILOSEC) 20 MG capsule Take 1 capsule (20 mg total) by mouth daily. 30 capsule 3  . saxagliptin HCl (ONGLYZA) 5 MG TABS tablet Take 1 tablet (5 mg total) by mouth daily. 30 tablet 11  . TRESIBA FLEXTOUCH 200 UNIT/ML SOPN Inject 90 Units into the skin daily. 6 pen 11  . vitamin B-12 (CYANOCOBALAMIN) 100 MCG tablet Take 100 mcg by mouth daily.    . vitamin C (ASCORBIC ACID) 500 MG tablet Take 1,000 mg by mouth daily.    . vitamin E 400 UNIT capsule Take 400 Units by mouth daily. Reported on 02/13/2016     No  current facility-administered medications on  file prior to visit.     Allergies  Allergen Reactions  . Penicillins Hives    Hives   . Tramadol Itching    Family History  Problem Relation Age of Onset  . Diabetes Father 15       Deceased  . Diabetes Mother        Living  . Hyperlipidemia Mother   . Tuberculosis Mother   . Asthma Mother   . Heart disease Maternal Uncle   . Colon cancer Maternal Uncle        dx in 22's  . Diabetes Brother   . Heart disease Maternal Aunt   . Arthritis Other        Maternal Aunts & Uncles  . Diabetes Sister   . Kidney failure Brother 30       Diseased  . Healthy Son        x1  . Breast cancer Cousin 13  . Esophageal cancer Neg Hx   . Rectal cancer Neg Hx   . Stomach cancer Neg Hx     BP 124/80 (BP Location: Left Arm, Patient Position: Sitting, Cuff Size: Large)   Pulse 82   Ht 5\' 6"  (1.676 m)   Wt 231 lb (104.8 kg)   SpO2 95%   BMI 37.28 kg/m    Review of Systems Denies LOC    Objective:   Physical Exam VITAL SIGNS:  See vs page GENERAL: no distress Pulses: dorsalis pedis intact bilat.   MSK: no deformity of the feet CV: trace bilat leg edema Skin:  no ulcer on the feet.  normal color and temp on the feet. Neuro: sensation is intact to touch on the feet   Lab Results  Component Value Date   HGBA1C 6.5 (A) 01/06/2019       Assessment & Plan:  Insulin-requiring type 2 DM: overcontrolled, given this regimen, which does match insulin to her changing needs throughout the day Edema: this limits rx options Hypoglycemia: this limits aggressiveness of glycemic control  Reduce amaryl to 2 mg qam Please come back for a follow-up appointment in 3 months (no avs due to computer outage)

## 2019-01-15 ENCOUNTER — Other Ambulatory Visit: Payer: Self-pay | Admitting: Endocrinology

## 2019-01-15 NOTE — Telephone Encounter (Signed)
Please advise if this dosage is correct

## 2019-01-17 ENCOUNTER — Other Ambulatory Visit: Payer: Self-pay | Admitting: Endocrinology

## 2019-01-20 ENCOUNTER — Encounter: Payer: Self-pay | Admitting: Registered"

## 2019-01-20 ENCOUNTER — Encounter: Payer: PRIVATE HEALTH INSURANCE | Attending: Physician Assistant | Admitting: Registered"

## 2019-01-20 DIAGNOSIS — E1165 Type 2 diabetes mellitus with hyperglycemia: Secondary | ICD-10-CM | POA: Insufficient documentation

## 2019-01-20 DIAGNOSIS — E119 Type 2 diabetes mellitus without complications: Secondary | ICD-10-CM

## 2019-01-20 NOTE — Progress Notes (Signed)
Diabetes Self-Management Education  Visit Type: Follow-up  Appt. Start Time: 1130 Appt. End Time: 1200  01/20/2019  [name], identified by name and date of birth, is a 58 y.o. female with a diagnosis of Diabetes: Type 2.   ASSESSMENT  There were no vitals taken for this visit. There is no height or weight on file to calculate BMI.   Per chart A1c labs her A1c has reduced from 8.2% to 6.5% in 2 months. Pt states the main changes she has made since her last RD visit was cutting back soda and being more consistent with taking medication. Pt states with her new insurance in place it makes it possible for her pay for medical treatment and using Flex Plan to help her plan and save for medical expenses. Pt was a little surprised that A1c reduced without weight loss and RD reinforced that behavior change was the right place to focus instead of the scale.  Pt states she does not check BG very often, usually just when not feeling well which happened 2 days ago, patient forgot to take 2 doses of medications, after eating a egg bagel took BG and it was 199 mg/dL and drank some ice water. Pt states at last MD(Endo) office visit she took her BG out of curiosity and it was 47 mg/dL without any hypoglycemia symptoms. Per patient and MD notes, patients DM medication was reduced. Patient checked BG while in RD office, was 84 mg/dL. Pt states her last meal was ~5 hours ago. RD encouraged her to eat lunch after visit.  Pt states she loves vegetables and eats them a lot, however in her 24-hr recall she did not report any vegetable intake. Pt states she had a busy day at work and that was not a usual day. Pt provided her plan for dinner tonight which included cabbage. Pt states she eats whole grain bread about half the time, but also enjoys cinnamon raisin bread. Pt asked about nutritional supplements. Pt states her father gets more Ensure than he can drink and he wants to share them with her, but she was not  aware of the difference between the Plus (added calories to hep with weight gain) and Glucose Control varieties.  Pt states although she has not had a menstrual for many years since having a partial hysterectomy, pt reports the beginning of each month she has PMS symptoms such as bloating, craves salt & potato chips, chocolate.  Pt states she is still taking on-line classes. 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  Ideas for next visit: Pt states her desire for weight loss is no longer tied to A1c since it is low and maybe even below target based on what her doctor told her. Pt reports she would like to have more energy, and reduce her occasional bloating. Pt states she would also like to be more active and work around her limitations of neck movement and knee pain (states she need replacement surgery). Pt states sometimes she pushes herself but then will hurt too much to get good sleep. Pt states she would like to go the gym.  Diabetes Self-Management Education - 01/20/19 1100      Visit Information   Visit Type  Follow-up      Initial Visit   Diabetes Type  Type 2  Are you taking your medications as prescribed?  Yes      Complications   Last HgB A1C per patient/outside source  6.5 %    How often do you check your blood sugar?  3-4 times / week      Dietary Intake   Breakfast  2 [place] bacon, 1 fried egg, coffee    Snack (morning)  none    Lunch  [place], cheese, mayo, wheat bread    Snack (afternoon)  none    Dinner  oodles of noodles, 2 hot dogs    Snack (evening)  sm bag popcorn    Beverage(s)  water, coffee, occassional soda 1-2x month      Exercise   Exercise Type  ADL's      Individualized Goals (developed by patient)   Nutrition  General guidelines for healthy choices and portions discussed    Medications  take my  medication as prescribed      Patient Self-Evaluation of Goals - Patient rates self as meeting previously set goals (% of time)   Nutrition  50 - 75 %    Medications  >75%      Outcomes   Expected Outcomes  Demonstrated interest in learning. Expect positive outcomes    Future DMSE  2 months    Program Status  Completed      Subsequent Visit   Since your last visit have you continued or begun to take your medications as prescribed?  Yes    Since your last visit have you experienced any weight changes?  Gain    Weight Gain (lbs)  2   per patient   Since your last visit, are you checking your blood glucose at least once a day?  No      Individualized Plan for Diabetes Self-Management Training:   Learning Objective:  Patient will have a greater understanding of diabetes self-management. Patient education plan is to attend individual and/or group sessions per assessed needs and concerns.    Patient Instructions  Continue with the changes you have made which has made a difference brining down your A1c Because you do not have symptoms with low blood sugar avoid skipping meals Can use nutritional supplements when you are in a hurry or not that hungry, look for blood sugar control options in Boost, Ensure, Glucerna or other brands. Aim to eat non-starchy vegetables daily with at least one meal. We can talk more about ways to help increase energy and reduce bloating at next visit. Bring a 3-day food diary, along with notes about sleep and activity  Expected Outcomes:  Demonstrated interest in learning. Expect positive outcomes  Education material provided: none (Boost, Glucerna coupons)  If problems or questions, patient to contact team via:  Phone  Future DSME appointment: 2 months

## 2019-01-20 NOTE — Patient Instructions (Addendum)
Continue with the changes you have made which has made a difference brining down your A1c Because you do not have symptoms with low blood sugar avoid skipping meals Can use nutritional supplements when you are in a hurry or not that hungry, look for blood sugar control options in Boost, Ensure, Glucerna or other brands. Aim to eat non-starchy vegetables daily with at least one meal. We can talk more about ways to help increase energy and reduce bloating at next visit. Bring a 3-day food diary, along with notes about sleep and activity

## 2019-02-07 ENCOUNTER — Other Ambulatory Visit: Payer: Self-pay | Admitting: Physician Assistant

## 2019-02-27 ENCOUNTER — Other Ambulatory Visit: Payer: Self-pay | Admitting: Physician Assistant

## 2019-02-27 DIAGNOSIS — M5412 Radiculopathy, cervical region: Secondary | ICD-10-CM

## 2019-02-28 ENCOUNTER — Other Ambulatory Visit: Payer: Self-pay | Admitting: Physician Assistant

## 2019-02-28 DIAGNOSIS — M62838 Other muscle spasm: Secondary | ICD-10-CM

## 2019-03-31 ENCOUNTER — Other Ambulatory Visit: Payer: Self-pay | Admitting: Physician Assistant

## 2019-03-31 ENCOUNTER — Ambulatory Visit (INDEPENDENT_AMBULATORY_CARE_PROVIDER_SITE_OTHER): Payer: PRIVATE HEALTH INSURANCE | Admitting: Physician Assistant

## 2019-03-31 ENCOUNTER — Other Ambulatory Visit: Payer: Self-pay

## 2019-03-31 ENCOUNTER — Encounter: Payer: Self-pay | Admitting: Physician Assistant

## 2019-03-31 DIAGNOSIS — E785 Hyperlipidemia, unspecified: Secondary | ICD-10-CM

## 2019-03-31 DIAGNOSIS — E669 Obesity, unspecified: Secondary | ICD-10-CM

## 2019-03-31 DIAGNOSIS — E66811 Obesity, class 1: Secondary | ICD-10-CM | POA: Insufficient documentation

## 2019-03-31 HISTORY — DX: Obesity, class 1: E66.811

## 2019-03-31 HISTORY — DX: Obesity, unspecified: E66.9

## 2019-03-31 MED ORDER — ATORVASTATIN CALCIUM 10 MG PO TABS: 10.0000 mg | ORAL_TABLET | Freq: Every day | ORAL | 1 refills | Status: DC

## 2019-03-31 MED ORDER — GABAPENTIN 300 MG PO CAPS: ORAL_CAPSULE | ORAL | 1 refills | Status: DC

## 2019-03-31 MED ORDER — OMEPRAZOLE 20 MG PO CPDR: 20.0000 mg | DELAYED_RELEASE_CAPSULE | Freq: Every day | ORAL | 1 refills | Status: DC

## 2019-03-31 MED ORDER — FAMOTIDINE 20 MG PO TABS: 20.0000 mg | ORAL_TABLET | Freq: Two times a day (BID) | ORAL | 1 refills | Status: DC

## 2019-03-31 MED ORDER — NAPROXEN 500 MG PO TABS: 500.0000 mg | ORAL_TABLET | Freq: Two times a day (BID) | ORAL | 0 refills | Status: DC

## 2019-03-31 MED ORDER — LEVOCETIRIZINE DIHYDROCHLORIDE 5 MG PO TABS: ORAL_TABLET | ORAL | 1 refills | Status: DC

## 2019-03-31 NOTE — Progress Notes (Signed)
I have discussed the procedure for the virtual visit with the patient who has given consent to proceed with assessment and treatment.   [name], CMA     

## 2019-03-31 NOTE — Assessment & Plan Note (Signed)
Has made significant changes to diet. Follow-up with nutritionist when able. Keep working towards what exercises that are easier on the joints. Reviewed with patient. Lost 15 pounds thus far. Will continue to monitor. Recheck labs once COVID pandemic is over.

## 2019-03-31 NOTE — Progress Notes (Signed)
Virtual Visit via Video   I connected with patient on 03/31/19 at  9:40 AM EDT by a video enabled telemedicine application and verified that I am speaking with the correct person using two identifiers.  Location patient: Home Location provider: [facility], Office Persons participating in the virtual visit: Patient, Provider, [place] (Patina Moore)  I discussed the limitations of evaluation and management by telemedicine and the availability of in person appointments. The patient expressed understanding and agreed to proceed.  Subjective:   HPI:   Patient presents today via [employer] for follow-up of obesity.  Obesity -- in patient with DM II. Has been working with a nutritionist and has made significant changes to diet. Cannot follow-up with them right now due to [place] pandemic. Has cut out fatty foods and is sticking to lean protein. Has started a tea daily that is supposed to be good for weight. Cannot give name currently. Notes she has lost 15 pounds in the lat 1.5 month. Exercise is limited due to her end-stage OA of left knee  (surgery recommended but she has been trying to postpone for now).  Wt Readings from Last 3 Encounters:  03/31/19 215 lb (97.5 kg)  01/06/19 231 lb (104.8 kg)  12/22/18 229 lb (103.9 kg)    ROS:   See pertinent positives and negatives per HPI.  Patient Active Problem List   Diagnosis Date Noted  . Uncontrolled type 2 diabetes mellitus with hyperglycemia ([place]) 04/15/2018  . Gastroesophageal reflux disease without esophagitis 03/11/2018  . Need for pneumococcal vaccination 03/11/2018  . Essential hypertension 06/13/2016  . Diabetes mellitus without complication ([place]) 25/95/6387  . Carpal tunnel syndrome 12/29/2015  . Lesion of ulnar nerve 12/29/2015  . Visit for preventive health examination 12/20/2015  . Breast cancer screening 12/20/2015  . Cervical radiculopathy 11/28/2015  . Primary osteoarthritis of both knees 10/10/2014  . Trigger  finger, acquired 10/10/2014    Social History   Tobacco Use  . Smoking status: Former Smoker    Packs/day: 0.25    Years: 20.00    Pack years: 5.00    Types: Cigarettes    Last attempt to quit: 12/09/2009    Years since quitting: 9.3  . Smokeless tobacco: Never Used  Substance Use Topics  . Alcohol use: Yes    Alcohol/week: 0.0 standard drinks    Comment: social    Current Outpatient Medications:  .  atorvastatin (LIPITOR) 10 MG tablet, Take 1 tablet (10 mg total) by mouth daily., Disp: 90 tablet, Rfl: 1 .  azelastine (ASTELIN) 0.1 % nasal spray, Place 2 sprays into both nostrils 2 (two) times daily. Use in each nostril as directed, Disp: 30 mL, Rfl: 12 .  BAYER CONTOUR TEST test strip, USE TWICE DAILY AS INSTRUCTED TO CHECK SUGARS. DX E. 11.9, Disp: 100 each, Rfl: 5 .  BLACK COHOSH EXTRACT PO, Take by mouth., Disp: , Rfl:  .  Cinnamon 500 MG capsule, Take 1,000 mg by mouth 2 (two) times daily., Disp: , Rfl:  .  cyclobenzaprine (FLEXERIL) 10 MG tablet, TAKE 1 TABLET BY MOUTH AT BEDTIME, Disp: 30 tablet, Rfl: 1 .  Echinacea-Goldenseal (ECHINACEA COMB/GOLDEN SEAL PO), Take by mouth., Disp: , Rfl:  .  famotidine (PEPCID) 20 MG tablet, Take 1 tablet (20 mg total) by mouth 2 (two) times daily., Disp: 60 tablet, Rfl: 3 .  fluocinonide cream (LIDEX) 5.64 %, Apply 1 application topically 2 (two) times daily., Disp: , Rfl:  .  fluticasone (FLONASE) 50 MCG/ACT nasal spray,  Place 2 sprays into both nostrils daily., Disp: 16 g, Rfl: 2 .  gabapentin (NEURONTIN) 300 MG capsule, TAKE 1 CAPSULE BY MOUTH THREE TIMES A DAY, Disp: 270 capsule, Rfl: 1 .  glimepiride (AMARYL) 4 MG tablet, Take 1 tablet (4 mg total) by mouth daily before breakfast., Disp: 90 tablet, Rfl: 0 .  GLUCOSAMINE PO, Take 1 tablet by mouth 2 (two) times daily., Disp: , Rfl:  .  glucose blood (CONTOUR NEXT TEST) test strip, Used to check blood sugars 2x daily. DX CODE E11.9., Disp: 100 each, Rfl: 12 .  Insulin Pen Needle (BD PEN  NEEDLE NANO U/F) 32G X 4 MM MISC, Used to give daily insulin injections., Disp: 100 each, Rfl: 4 .  JARDIANCE 10 MG TABS tablet, TAKE 10 MG BY MOUTH DAILY., Disp: 90 tablet, Rfl: 4 .  levocetirizine (XYZAL) 5 MG tablet, TAKE 1 TABLET BY MOUTH EVERY DAY IN THE EVENING, Disp: 90 tablet, Rfl: 1 .  losartan-hydrochlorothiazide (HYZAAR) 50-12.5 MG tablet, TAKE 1 TABLET BY MOUTH EVERY DAY, Disp: 90 tablet, Rfl: 1 .  metFORMIN (GLUCOPHAGE) 500 MG tablet, Take 1 tablet (500 mg total) by mouth 2 (two) times daily with a meal., Disp: 180 tablet, Rfl: 3 .  MICROLET LANCETS MISC, USE TO MONITOR GLUCOSE LEVELS TWICE DAILY, Disp: 100 each, Rfl: 1 .  Misc Natural Products (HERBAL ENERGY COMPLEX PO), Take by mouth 2 (two) times daily with a meal., Disp: , Rfl:  .  Multiple Vitamins-Minerals (WOMENS MULTIVITAMIN PO), Take by mouth daily., Disp: , Rfl:  .  naproxen (NAPROSYN) 500 MG tablet, Take 1 tablet (500 mg total) by mouth 2 (two) times daily with a meal., Disp: 20 tablet, Rfl: 0 .  Omega-3 1000 MG CAPS, Take 1 g by mouth daily., Disp: , Rfl:  .  omeprazole (PRILOSEC) 20 MG capsule, Take 1 capsule (20 mg total) by mouth daily., Disp: 30 capsule, Rfl: 3 .  saxagliptin HCl (ONGLYZA) 5 MG TABS tablet, Take 1 tablet (5 mg total) by mouth daily., Disp: 30 tablet, Rfl: 11 .  TRESIBA FLEXTOUCH 200 UNIT/ML SOPN, Inject 90 Units into the skin daily., Disp: 6 pen, Rfl: 11 .  vitamin B-12 (CYANOCOBALAMIN) 100 MCG tablet, Take 100 mcg by mouth daily., Disp: , Rfl:  .  vitamin C (ASCORBIC ACID) 500 MG tablet, Take 1,000 mg by mouth daily., Disp: , Rfl:  .  vitamin E 400 UNIT capsule, Take 400 Units by mouth daily. Reported on 02/13/2016, Disp: , Rfl:   Allergies  Allergen Reactions  . Penicillins Hives    Hives   . Tramadol Itching    Objective:   BP 127/88   Pulse 91   Temp (!) 97.2 F (36.2 C) (Tympanic)   Ht 5\' 6"  (1.676 m)   Wt 215 lb (97.5 kg)   BMI 34.70 kg/m   Patient is well-developed, well-nourished  in no acute distress.  Resting comfortably at home.  Head is normocephalic, atraumatic.  No labored breathing.  Speech is clear and coherent with logical contest.  Patient is alert and oriented at baseline.    Assessment and Plan:   Obesity (BMI 30.0-34.9) Has made significant changes to diet. Follow-up with nutritionist when able. Keep working towards what exercises that are easier on the joints. Reviewed with patient. Lost 15 pounds thus far. Will continue to monitor. Recheck labs once COVID pandemic is over.     Leeanne Rio, PA-C 03/31/2019

## 2019-04-04 ENCOUNTER — Other Ambulatory Visit: Payer: Self-pay | Admitting: Physician Assistant

## 2019-04-06 ENCOUNTER — Other Ambulatory Visit: Payer: Self-pay | Admitting: Emergency Medicine

## 2019-04-06 ENCOUNTER — Other Ambulatory Visit: Payer: Self-pay | Admitting: Physician Assistant

## 2019-04-06 ENCOUNTER — Telehealth: Payer: Self-pay | Admitting: Physician Assistant

## 2019-04-06 DIAGNOSIS — Z1231 Encounter for screening mammogram for malignant neoplasm of breast: Secondary | ICD-10-CM

## 2019-04-06 DIAGNOSIS — K219 Gastro-esophageal reflux disease without esophagitis: Secondary | ICD-10-CM

## 2019-04-06 MED ORDER — OMEPRAZOLE 20 MG PO CPDR: 20.0000 mg | DELAYED_RELEASE_CAPSULE | Freq: Every day | ORAL | 0 refills | Status: DC

## 2019-04-06 NOTE — Telephone Encounter (Signed)
Advised patient that the Naproxen was sent to the local pharmacy. Omeprazole was sent to the mail order pharmacy. Sent over a 30 day supply to the local pharmacy

## 2019-04-06 NOTE — Telephone Encounter (Signed)
Patient was told her refills were denied for Naproxen and Omeprazole. She would like the refills to be sent in as soon as possible and if it unable to be done she would like a call back

## 2019-04-10 ENCOUNTER — Other Ambulatory Visit: Payer: Self-pay | Admitting: Endocrinology

## 2019-04-10 DIAGNOSIS — E1165 Type 2 diabetes mellitus with hyperglycemia: Secondary | ICD-10-CM

## 2019-04-14 ENCOUNTER — Ambulatory Visit: Payer: PRIVATE HEALTH INSURANCE | Admitting: Registered"

## 2019-04-27 ENCOUNTER — Other Ambulatory Visit: Payer: Self-pay

## 2019-04-28 ENCOUNTER — Other Ambulatory Visit: Payer: Self-pay | Admitting: Physician Assistant

## 2019-04-29 ENCOUNTER — Ambulatory Visit: Payer: PRIVATE HEALTH INSURANCE | Admitting: Endocrinology

## 2019-04-29 ENCOUNTER — Encounter: Payer: Self-pay | Admitting: Endocrinology

## 2019-04-29 ENCOUNTER — Other Ambulatory Visit: Payer: Self-pay

## 2019-04-29 VITALS — BP 142/72 | HR 96 | Ht 66.0 in | Wt 226.4 lb

## 2019-04-29 DIAGNOSIS — E1165 Type 2 diabetes mellitus with hyperglycemia: Secondary | ICD-10-CM

## 2019-04-29 LAB — POCT GLYCOSYLATED HEMOGLOBIN (HGB A1C): Hemoglobin A1C: 7.4 % — AB (ref 4.0–5.6)

## 2019-04-29 NOTE — Progress Notes (Signed)
Subjective:    Patient ID: [name], female    DOB: 08-08-1961, 58 y.o.   MRN: [id number]  HPI Pt returns for f/u of diabetes mellitus:  DM type: Insulin-requiring type 2.  Dx'ed: 2017, when she had a cbg of 400 on steroids.  Complications: none.  Therapy: insulin and 4 oral meds.   GDM: never.   DKA: never.  Severe hypoglycemia: never.  Pancreatitis: never.    Other: she is on qd insulin, due to noncompliance.   Interval history:  No recent steroids.  no cbg record, but states cbg's vary from 76-230.  It is in general higher as the day goes on.  pt states she feels well in general.  She declines to reduce or d/c glimepiride.  Past Medical History:  Diagnosis Date  . Anemia   . Arthritis    bilateral knees  . Bronchitis    hx of  . Fibroid tumor    Had partial hysterectomy  . Gallstones   . Migraine    hx of    Past Surgical History:  Procedure Laterality Date  . ABDOMINAL HYSTERECTOMY     partial   . boil removed     . CARPAL TUNNEL RELEASE  04-15-15  . CHOLECYSTECTOMY  Oct 2012  . ELBOW SURGERY  04-15-15  . HAND SURGERY  04-15-15  . Nerve damage  04-15-15  . TOTAL KNEE ARTHROPLASTY  08/01/2012   Procedure: TOTAL KNEE ARTHROPLASTY;  Surgeon: Augustin Schooling, MD;  Location: [facility];  Service: Orthopedics;  Laterality: Right;  RIGHT TOTAL KNEE ARTHROPLASTY  . TRIGGER FINGER RELEASE  04-15-15  . TUBAL LIGATION    . WISDOM TOOTH EXTRACTION      Social History   Socioeconomic History  . Marital status: Single    Spouse name: Not on file  . Number of children: Not on file  . Years of education: Not on file  . Highest education level: Not on file  Occupational History  . Not on file  Social Needs  . Financial resource strain: Not on file  . Food insecurity:    Worry: Not on file    Inability: Not on file  . Transportation needs:    Medical: Not on file    Non-medical: Not on file  Tobacco Use  . Smoking status: Former Smoker    Packs/day: 0.25    Years: 20.00     Pack years: 5.00    Types: Cigarettes    Last attempt to quit: 12/09/2009    Years since quitting: 9.4  . Smokeless tobacco: Never Used  Substance and Sexual Activity  . Alcohol use: Yes    Alcohol/week: 0.0 standard drinks    Comment: social  . Drug use: No  . Sexual activity: Not Currently  Lifestyle  . Physical activity:    Days per week: Not on file    Minutes per session: Not on file  . Stress: Not on file  Relationships  . Social connections:    Talks on phone: Not on file    Gets together: Not on file    Attends religious service: Not on file    Active member of club or organization: Not on file    Attends meetings of clubs or organizations: Not on file    Relationship status: Not on file  . Intimate partner violence:    Fear of current or ex partner: Not on file    Emotionally abused: Not on file  Physically abused: Not on file    Forced sexual activity: Not on file  Other Topics Concern  . Not on file  Social History Narrative  . Not on file    Current Outpatient Medications on File Prior to Visit  Medication Sig Dispense Refill  . atorvastatin (LIPITOR) 10 MG tablet Take 1 tablet (10 mg total) by mouth daily. 90 tablet 1  . azelastine (ASTELIN) 0.1 % nasal spray Place 2 sprays into both nostrils 2 (two) times daily. Use in each nostril as directed 30 mL 12  . BAYER CONTOUR TEST test strip USE TWICE DAILY AS INSTRUCTED TO CHECK SUGARS. DX E. 11.9 100 each 5  . BLACK COHOSH EXTRACT PO Take by mouth.    . Cinnamon 500 MG capsule Take 1,000 mg by mouth 2 (two) times daily.    . cyclobenzaprine (FLEXERIL) 10 MG tablet TAKE 1 TABLET BY MOUTH AT BEDTIME 30 tablet 1  . Echinacea-Goldenseal (ECHINACEA COMB/GOLDEN SEAL PO) Take by mouth.    . famotidine (PEPCID) 20 MG tablet Take 1 tablet (20 mg total) by mouth 2 (two) times daily. 180 tablet 1  . fluocinonide cream (LIDEX) 7.58 % Apply 1 application topically 2 (two) times daily.    . fluticasone (FLONASE) 50  MCG/ACT nasal spray Place 2 sprays into both nostrils daily. 16 g 2  . gabapentin (NEURONTIN) 300 MG capsule TAKE 1 CAPSULE BY MOUTH THREE TIMES A DAY 270 capsule 1  . glimepiride (AMARYL) 4 MG tablet Take 1 tablet (4 mg total) by mouth daily before breakfast. 90 tablet 0  . GLUCOSAMINE PO Take 1 tablet by mouth 2 (two) times daily.    Marland Kitchen glucose blood (CONTOUR NEXT TEST) test strip Used to check blood sugars 2x daily. DX CODE E11.9. 100 each 12  . Insulin Pen Needle (BD PEN NEEDLE NANO U/F) 32G X 4 MM MISC Used to give daily insulin injections. 100 each 4  . JARDIANCE 10 MG TABS tablet TAKE 10 MG BY MOUTH DAILY. 90 tablet 4  . levocetirizine (XYZAL) 5 MG tablet TAKE 1 TABLET BY MOUTH EVERY DAY IN THE EVENING 90 tablet 1  . losartan-hydrochlorothiazide (HYZAAR) 50-12.5 MG tablet TAKE 1 TABLET BY MOUTH EVERY DAY 90 tablet 1  . metFORMIN (GLUCOPHAGE) 500 MG tablet Take 1 tablet (500 mg total) by mouth 2 (two) times daily with a meal. 180 tablet 3  . MICROLET LANCETS MISC USE TO MONITOR GLUCOSE LEVELS TWICE DAILY 100 each 1  . Misc Natural Products (HERBAL ENERGY COMPLEX PO) Take by mouth 2 (two) times daily with a meal.    . Multiple Vitamins-Minerals (WOMENS MULTIVITAMIN PO) Take by mouth daily.    . naproxen (NAPROSYN) 500 MG tablet Take 1 tablet (500 mg total) by mouth 2 (two) times daily with a meal. 20 tablet 0  . Omega-3 1000 MG CAPS Take 1 g by mouth daily.    Marland Kitchen omeprazole (PRILOSEC) 20 MG capsule Take 1 capsule (20 mg total) by mouth daily. 30 capsule 0  . saxagliptin HCl (ONGLYZA) 5 MG TABS tablet Take 1 tablet (5 mg total) by mouth daily. 30 tablet 11  . TRESIBA FLEXTOUCH 200 UNIT/ML SOPN Inject 90 Units into the skin daily. 6 pen 11  . vitamin B-12 (CYANOCOBALAMIN) 100 MCG tablet Take 100 mcg by mouth daily.    . vitamin C (ASCORBIC ACID) 500 MG tablet Take 1,000 mg by mouth daily.    . vitamin E 400 UNIT capsule Take 400 Units by mouth daily.  Reported on 02/13/2016     No current  facility-administered medications on file prior to visit.     Allergies  Allergen Reactions  . Penicillins Hives    Hives   . Tramadol Itching    Family History  Problem Relation Age of Onset  . Diabetes Father 3       Deceased  . Diabetes Mother        Living  . Hyperlipidemia Mother   . Tuberculosis Mother   . Asthma Mother   . Heart disease Maternal Uncle   . Colon cancer Maternal Uncle        dx in 14's  . Diabetes Brother   . Heart disease Maternal Aunt   . Arthritis Other        Maternal Aunts & Uncles  . Diabetes Sister   . Kidney failure Brother 30       Diseased  . Healthy Son        x1  . Breast cancer Cousin 68  . Esophageal cancer Neg Hx   . Rectal cancer Neg Hx   . Stomach cancer Neg Hx     BP (!) 142/72 (BP Location: Left Arm, Patient Position: Sitting, Cuff Size: Large)   Pulse 96   Ht 5\' 6"  (1.676 m)   Wt 226 lb 6.4 oz (102.7 kg)   SpO2 94%   BMI 36.54 kg/m    Review of Systems She denies hypoglycemia.      Objective:   Physical Exam VITAL SIGNS:  See vs page GENERAL: no distress Pulses: dorsalis pedis intact bilat.   MSK: no deformity of the feet CV: no leg edema Skin:  no ulcer on the feet.  normal color and temp on the feet. Neuro: sensation is intact to touch on the feet.     Lab Results  Component Value Date   CREATININE 0.82 03/11/2018   BUN 17 03/11/2018   NA 139 03/11/2018   K 4.3 03/11/2018   CL 104 03/11/2018   CO2 29 03/11/2018   Lab Results  Component Value Date   HGBA1C 7.4 (A) 04/29/2019      Assessment & Plan:  Insulin-requiring type 2 DM: this is the best control this pt should aim for, given this regimen, which does match insulin to her changing needs throughout the day.  HTN: recheck next time.    Patient Instructions  Please continue the same medications for diabetes.   check your blood sugar twice a day.  vary the time of day when you check, between before the 3 meals, and at bedtime.  also check if  you have symptoms of your blood sugar being too high or too low.  please keep a record of the readings and bring it to your next appointment here (or you can bring the meter itself).  You can write it on any piece of paper.  please call us sooner if your blood sugar goes below 70, or if you have a lot of readings over 200.  Please come back for a follow-up appointment in 3 months.

## 2019-04-29 NOTE — Patient Instructions (Addendum)
Please continue the same medications for diabetes.   check your blood sugar twice a day.  vary the time of day when you check, between before the 3 meals, and at bedtime.  also check if you have symptoms of your blood sugar being too high or too low.  please keep a record of the readings and bring it to your next appointment here (or you can bring the meter itself).  You can write it on any piece of paper.  please call us sooner if your blood sugar goes below 70, or if you have a lot of readings over 200.  Please come back for a follow-up appointment in 3 months.

## 2019-05-02 ENCOUNTER — Other Ambulatory Visit: Payer: Self-pay | Admitting: Physician Assistant

## 2019-05-02 DIAGNOSIS — K219 Gastro-esophageal reflux disease without esophagitis: Secondary | ICD-10-CM

## 2019-05-06 ENCOUNTER — Other Ambulatory Visit: Payer: Self-pay | Admitting: Physician Assistant

## 2019-05-06 DIAGNOSIS — M62838 Other muscle spasm: Secondary | ICD-10-CM

## 2019-05-21 ENCOUNTER — Other Ambulatory Visit: Payer: Self-pay | Admitting: Physician Assistant

## 2019-05-21 DIAGNOSIS — M5412 Radiculopathy, cervical region: Secondary | ICD-10-CM

## 2019-05-22 ENCOUNTER — Other Ambulatory Visit: Payer: Self-pay

## 2019-05-22 DIAGNOSIS — E119 Type 2 diabetes mellitus without complications: Secondary | ICD-10-CM

## 2019-05-22 MED ORDER — METFORMIN HCL 500 MG PO TABS: 500.0000 mg | ORAL_TABLET | Freq: Two times a day (BID) | ORAL | 3 refills | Status: DC

## 2019-05-22 MED ORDER — GLIMEPIRIDE 4 MG PO TABS: 4.0000 mg | ORAL_TABLET | Freq: Every day | ORAL | 0 refills | Status: DC

## 2019-05-28 ENCOUNTER — Telehealth: Payer: Self-pay | Admitting: Physician Assistant

## 2019-05-28 ENCOUNTER — Other Ambulatory Visit: Payer: Self-pay | Admitting: Emergency Medicine

## 2019-05-28 ENCOUNTER — Other Ambulatory Visit: Payer: Self-pay | Admitting: Endocrinology

## 2019-05-28 ENCOUNTER — Other Ambulatory Visit: Payer: Self-pay | Admitting: Physician Assistant

## 2019-05-28 DIAGNOSIS — M62838 Other muscle spasm: Secondary | ICD-10-CM

## 2019-05-28 DIAGNOSIS — E785 Hyperlipidemia, unspecified: Secondary | ICD-10-CM

## 2019-05-28 DIAGNOSIS — K219 Gastro-esophageal reflux disease without esophagitis: Secondary | ICD-10-CM

## 2019-05-28 DIAGNOSIS — M5412 Radiculopathy, cervical region: Secondary | ICD-10-CM

## 2019-05-28 MED ORDER — ATORVASTATIN CALCIUM 10 MG PO TABS: 10.0000 mg | ORAL_TABLET | Freq: Every day | ORAL | 1 refills | Status: DC

## 2019-05-28 MED ORDER — OMEPRAZOLE 20 MG PO CPDR: 20.0000 mg | DELAYED_RELEASE_CAPSULE | Freq: Every day | ORAL | 1 refills | Status: DC

## 2019-05-28 MED ORDER — CYCLOBENZAPRINE HCL 10 MG PO TABS: 10.0000 mg | ORAL_TABLET | Freq: Every day | ORAL | 1 refills | Status: DC

## 2019-05-28 MED ORDER — GABAPENTIN 300 MG PO CAPS: 300.0000 mg | ORAL_CAPSULE | Freq: Three times a day (TID) | ORAL | 1 refills | Status: DC

## 2019-05-28 NOTE — Telephone Encounter (Signed)
Last refill:03/31/19 #20, 0 Last OV:03/31/19

## 2019-05-28 NOTE — Telephone Encounter (Signed)
RX REFILL:  naproxen (NAPROSYN) 500  omeprazole (PRILOSEC) 20 MG  atorvastatin (LIPITOR) 10 MG  gabapentin (NEURONTIN) 300 MG  cyclobenzaprine (FLEXERIL) 10 MG  Pharmacy:   [facility], [place] to [facility] [phone] (Phone) [phone] (Fax)

## 2019-05-30 ENCOUNTER — Other Ambulatory Visit: Payer: Self-pay | Admitting: Endocrinology

## 2019-05-30 DIAGNOSIS — E119 Type 2 diabetes mellitus without complications: Secondary | ICD-10-CM

## 2019-06-02 ENCOUNTER — Ambulatory Visit: Payer: PRIVATE HEALTH INSURANCE

## 2019-06-08 ENCOUNTER — Other Ambulatory Visit: Payer: Self-pay | Admitting: Endocrinology

## 2019-06-08 DIAGNOSIS — E119 Type 2 diabetes mellitus without complications: Secondary | ICD-10-CM

## 2019-06-14 ENCOUNTER — Other Ambulatory Visit: Payer: Self-pay | Admitting: Physician Assistant

## 2019-07-16 ENCOUNTER — Ambulatory Visit
Admission: RE | Admit: 2019-07-16 | Discharge: 2019-07-16 | Disposition: A | Discharge status: Home / Self Care | Payer: PRIVATE HEALTH INSURANCE | Source: Ambulatory Visit | Attending: Physician Assistant | Admitting: Physician Assistant

## 2019-07-16 ENCOUNTER — Other Ambulatory Visit: Payer: Self-pay

## 2019-07-16 DIAGNOSIS — Z1231 Encounter for screening mammogram for malignant neoplasm of breast: Secondary | ICD-10-CM

## 2019-07-26 ENCOUNTER — Other Ambulatory Visit: Payer: Self-pay | Admitting: Endocrinology

## 2019-07-29 ENCOUNTER — Ambulatory Visit: Payer: PRIVATE HEALTH INSURANCE | Admitting: Endocrinology

## 2019-08-09 ENCOUNTER — Other Ambulatory Visit: Payer: Self-pay | Admitting: Endocrinology

## 2019-08-09 DIAGNOSIS — E119 Type 2 diabetes mellitus without complications: Secondary | ICD-10-CM

## 2019-08-26 ENCOUNTER — Other Ambulatory Visit: Payer: Self-pay | Admitting: Physician Assistant

## 2019-08-26 DIAGNOSIS — M5412 Radiculopathy, cervical region: Secondary | ICD-10-CM

## 2019-09-07 ENCOUNTER — Encounter: Payer: Self-pay | Admitting: Physician Assistant

## 2019-09-07 ENCOUNTER — Other Ambulatory Visit: Payer: Self-pay

## 2019-09-07 ENCOUNTER — Ambulatory Visit (INDEPENDENT_AMBULATORY_CARE_PROVIDER_SITE_OTHER): Payer: PRIVATE HEALTH INSURANCE | Admitting: Physician Assistant

## 2019-09-07 VITALS — BP 107/75 | HR 111 | Temp 97.9°F

## 2019-09-07 DIAGNOSIS — R05 Cough: Secondary | ICD-10-CM | POA: Diagnosis not present

## 2019-09-07 DIAGNOSIS — J3489 Other specified disorders of nose and nasal sinuses: Secondary | ICD-10-CM

## 2019-09-07 DIAGNOSIS — R0981 Nasal congestion: Secondary | ICD-10-CM | POA: Diagnosis not present

## 2019-09-07 DIAGNOSIS — R5383 Other fatigue: Secondary | ICD-10-CM

## 2019-09-07 DIAGNOSIS — Z20822 Contact with and (suspected) exposure to covid-19: Secondary | ICD-10-CM

## 2019-09-07 DIAGNOSIS — Z20828 Contact with and (suspected) exposure to other viral communicable diseases: Secondary | ICD-10-CM

## 2019-09-07 MED ORDER — PROMETHAZINE-DM 6.25-15 MG/5ML PO SYRP: 5.0000 mL | ORAL_SOLUTION | Freq: Four times a day (QID) | ORAL | 0 refills | Status: DC | PRN

## 2019-09-07 NOTE — Patient Instructions (Signed)
Please go to [facility] [place], [place] tomorrow morning for COVID testing.  Quarantine until results are in.  I have enrolled you in a symptom monitoring program. You will receive daily messages to update [place] on symptoms.   Hydrate. Rest. Take cough medication as directed.  If you note any difficulty breathing, please go to the ER.

## 2019-09-07 NOTE — Progress Notes (Signed)
I have discussed the procedure for the virtual visit with the patient who has given consent to proceed with assessment and treatment.   Kellye Mizner S Pualani Borah, CMA     

## 2019-09-07 NOTE — Progress Notes (Signed)
Virtual Visit via Video   I connected with patient on 09/07/19 at  3:30 PM EDT by a video enabled telemedicine application and verified that I am speaking with the correct person using two identifiers.  Location patient: Home Location provider: [facility] Persons participating in the virtual visit: Patient, Provider, Villalba (Patina Moore)  I discussed the limitations of evaluation and management by telemedicine and the availability of in person appointments. The patient expressed understanding and agreed to proceed.  Subjective:   HPI:   Patient endorses 1 week of dry cough, nasal congestion, rhinorrhea. Denies chest congestion and fatigue. Denies fever, chills, SOB , chest pain. Denies changes in taste or smell. Denies diarrhea, abdominal pain. Denies recent travel or sick contact at work. Daughter + COVID. Last around her 1 week ago.   ROS:   See pertinent positives and negatives per HPI.  Patient Active Problem List   Diagnosis Date Noted  . Obesity (BMI 30.0-34.9) 03/31/2019  . Uncontrolled type 2 diabetes mellitus with hyperglycemia ([place]) 04/15/2018  . Gastroesophageal reflux disease without esophagitis 03/11/2018  . Need for pneumococcal vaccination 03/11/2018  . Essential hypertension 06/13/2016  . Diabetes mellitus without complication ([place]) 123456  . Carpal tunnel syndrome 12/29/2015  . Lesion of ulnar nerve 12/29/2015  . Visit for preventive health examination 12/20/2015  . Breast cancer screening 12/20/2015  . Cervical radiculopathy 11/28/2015  . Primary osteoarthritis of both knees 10/10/2014  . Trigger finger, acquired 10/10/2014    Social History   Tobacco Use  . Smoking status: Former Smoker    Packs/day: 0.25    Years: 20.00    Pack years: 5.00    Types: Cigarettes    Quit date: 12/09/2009    Years since quitting: 9.7  . Smokeless tobacco: Never Used  Substance Use Topics  . Alcohol use: Yes    Alcohol/week: 0.0 standard drinks     Comment: social    Current Outpatient Medications:  .  atorvastatin (LIPITOR) 10 MG tablet, Take 1 tablet (10 mg total) by mouth daily., Disp: 90 tablet, Rfl: 1 .  azelastine (ASTELIN) 0.1 % nasal spray, Place 2 sprays into both nostrils 2 (two) times daily. Use in each nostril as directed, Disp: 30 mL, Rfl: 12 .  BAYER CONTOUR TEST test strip, USE TWICE DAILY AS INSTRUCTED TO CHECK SUGARS. DX E. 11.9, Disp: 100 each, Rfl: 5 .  BLACK COHOSH EXTRACT PO, Take by mouth., Disp: , Rfl:  .  Cinnamon 500 MG capsule, Take 1,000 mg by mouth 2 (two) times daily., Disp: , Rfl:  .  cyclobenzaprine (FLEXERIL) 10 MG tablet, Take 1 tablet (10 mg total) by mouth at bedtime., Disp: 90 tablet, Rfl: 1 .  Echinacea-Goldenseal (ECHINACEA COMB/GOLDEN SEAL PO), Take by mouth., Disp: , Rfl:  .  famotidine (PEPCID) 20 MG tablet, Take 1 tablet (20 mg total) by mouth 2 (two) times daily., Disp: 180 tablet, Rfl: 1 .  fluocinonide cream (LIDEX) [id number] %, Apply 1 application topically 2 (two) times daily., Disp: , Rfl:  .  fluticasone (FLONASE) 50 MCG/ACT nasal spray, Place 2 sprays into both nostrils daily., Disp: 16 g, Rfl: 2 .  gabapentin (NEURONTIN) 300 MG capsule, Take 1 capsule (300 mg total) by mouth 3 (three) times daily., Disp: 270 capsule, Rfl: 1 .  glimepiride (AMARYL) 4 MG tablet, TAKE 1 TABLET DAILY BEFORE BREAKFAST- NEED APPOINTMENTFOR FURTHER REFILLS, Disp: 30 tablet, Rfl: 0 .  GLUCOSAMINE PO, Take 1 tablet by mouth 2 (two) times daily., Disp: ,  Rfl:  .  glucose blood (CONTOUR NEXT TEST) test strip, Used to check blood sugars 2x daily. DX CODE E11.9., Disp: 100 each, Rfl: 12 .  Insulin Pen Needle (BD PEN NEEDLE NANO U/F) 32G X 4 MM MISC, Used to give daily insulin injections., Disp: 100 each, Rfl: 4 .  JARDIANCE 10 MG TABS tablet, TAKE 10 MG BY MOUTH DAILY., Disp: 90 tablet, Rfl: 4 .  levocetirizine (XYZAL) 5 MG tablet, TAKE 1 TABLET BY MOUTH EVERY DAY IN THE EVENING, Disp: 90 tablet, Rfl: 1 .   losartan-hydrochlorothiazide (HYZAAR) 50-12.5 MG tablet, TAKE 1 TABLET BY MOUTH EVERY DAY, Disp: 90 tablet, Rfl: 1 .  meloxicam (MOBIC) 15 MG tablet, TAKE 1 TABLET BY MOUTH EVERY DAY, Disp: 30 tablet, Rfl: 0 .  metFORMIN (GLUCOPHAGE) 500 MG tablet, TAKE 1 TABLET (500 MG TOTAL) BY MOUTH 2 (TWO) TIMES DAILY WITH A MEAL., Disp: 180 tablet, Rfl: 3 .  MICROLET LANCETS MISC, USE TO MONITOR GLUCOSE LEVELS TWICE DAILY, Disp: 100 each, Rfl: 1 .  Misc Natural Products (HERBAL ENERGY COMPLEX PO), Take by mouth 2 (two) times daily with a meal., Disp: , Rfl:  .  Multiple Vitamins-Minerals (WOMENS MULTIVITAMIN PO), Take by mouth daily., Disp: , Rfl:  .  naproxen (NAPROSYN) 500 MG tablet, Take 1 tablet (500 mg total) by mouth 2 (two) times daily with a meal., Disp: 20 tablet, Rfl: 0 .  Omega-3 1000 MG CAPS, Take 1 g by mouth daily., Disp: , Rfl:  .  omeprazole (PRILOSEC) 20 MG capsule, Take 1 capsule (20 mg total) by mouth daily., Disp: 90 capsule, Rfl: 1 .  ONGLYZA 5 MG TABS tablet, TAKE 1 TABLET BY MOUTH EVERY DAY, Disp: 90 tablet, Rfl: 3 .  TRESIBA FLEXTOUCH 200 UNIT/ML SOPN, INJECT 80 UNITS INTO THE SKIN DAILY., Disp: 36 pen, Rfl: 5 .  vitamin B-12 (CYANOCOBALAMIN) 100 MCG tablet, Take 100 mcg by mouth daily., Disp: , Rfl:  .  vitamin C (ASCORBIC ACID) 500 MG tablet, Take 1,000 mg by mouth daily., Disp: , Rfl:  .  vitamin E 400 UNIT capsule, Take 400 Units by mouth daily. Reported on 02/13/2016, Disp: , Rfl:  .  promethazine-dextromethorphan (PROMETHAZINE-DM) 6.25-15 MG/5ML syrup, Take 5 mLs by mouth 4 (four) times daily as needed for cough., Disp: 118 mL, Rfl: 0  Allergies  Allergen Reactions  . Penicillins Hives    Hives   . Tramadol Itching    Objective:   BP 107/75   Pulse (!) 111   Temp 97.9 F (36.6 C) (Skin)   Patient is well-developed, well-nourished in no acute distress.  Resting comfortably at home.  Head is normocephalic, atraumatic.  No labored breathing.  Speech is clear and  coherent with logical content.  Patient is alert and oriented at baseline.   Assessment and Plan:   1. Suspected Covid-19 Virus Infection Mild symptoms but URI symptoms and + exposure to COVID. Send for testing. Quarantine until results are in. Patient enrolled in MyChart symptom monitoring program. Rx promethazine-DM.  Strict ER precautions reviewed with patient.   Leeanne Rio, PA-C 09/07/2019

## 2019-09-08 ENCOUNTER — Other Ambulatory Visit: Payer: Self-pay

## 2019-09-08 DIAGNOSIS — Z20822 Contact with and (suspected) exposure to covid-19: Secondary | ICD-10-CM

## 2019-09-09 LAB — NOVEL CORONAVIRUS, NAA: SARS-CoV-2, NAA: DETECTED — AB

## 2019-09-15 ENCOUNTER — Telehealth: Payer: Self-pay | Admitting: Physician Assistant

## 2019-09-15 NOTE — Telephone Encounter (Signed)
Patient calling in and wanted to speak to [facility] to her mom. Patient would like a call back to discuss. Please advise.

## 2019-09-16 NOTE — Telephone Encounter (Signed)
Manvi returned phone call would like a call back from [place]. Didn't go into detail as to what she needs to speak to [place] about.

## 2019-09-16 NOTE — Telephone Encounter (Signed)
This patient was calling concerning her mother. Will create a telephone note in patient chart. This is her daughter calling to speak to PCP

## 2019-09-23 ENCOUNTER — Telehealth: Payer: Self-pay | Admitting: *Deleted

## 2019-09-23 ENCOUNTER — Other Ambulatory Visit: Payer: Self-pay | Admitting: Family Medicine

## 2019-09-23 DIAGNOSIS — M5412 Radiculopathy, cervical region: Secondary | ICD-10-CM

## 2019-09-23 DIAGNOSIS — R0602 Shortness of breath: Secondary | ICD-10-CM

## 2019-09-23 NOTE — Telephone Encounter (Signed)
Let's see if we can get her set up for CXR at [facility] as they should be more equipped to handle COVID patients -- she should be out of window of infectivity since symptoms were 2 weeks ago. I want to make sure no sign of any secondary bronchitis/pneumonia. It is not uncommon to have the [place] for a while after COVID -- this takes times to improve/resolve.

## 2019-09-23 NOTE — Telephone Encounter (Signed)
Having sob on exertion. When walking down the stairs, she has to stop to catch her breathe. Does have some chest tightness. She states her cough has improved. Denies any wheezing.  SOB occurs with walking. Worst when she has the mask on. She is staying in her room.  Not back at work. Her co-workers has [place] as well.

## 2019-09-23 NOTE — Addendum Note (Signed)
Addended by: Leonidas Romberg on: 09/23/2019 04:40 PM   Modules accepted: Orders

## 2019-09-23 NOTE — Telephone Encounter (Signed)
Advised patient that her PCP wanted her to get a CXR to rule out any secondary infection (bronchitis or pneumonia). She is agreeable to go in the morning to [facility] for CXR. Order placed in chart

## 2019-09-23 NOTE — Telephone Encounter (Signed)
Please advise 

## 2019-09-23 NOTE — Telephone Encounter (Signed)
Can we assess the severity of symptoms. Is is shortness of breath at rest (if so needs UC/ER assessment) or just SOB with getting up and moving around. Any wheezing and chest tightness with this?

## 2019-09-23 NOTE — Telephone Encounter (Signed)
Patient called in and said she was diagnosed with Covid 2 weeks ago and she is still struggling with SOB.  She is asking if there is anything that can be done to help this.  She has no other symptoms since last week.

## 2019-09-24 ENCOUNTER — Ambulatory Visit (HOSPITAL_BASED_OUTPATIENT_CLINIC_OR_DEPARTMENT_OTHER)
Admission: RE | Admit: 2019-09-24 | Discharge: 2019-09-24 | Disposition: A | Discharge status: Home / Self Care | Payer: PRIVATE HEALTH INSURANCE | Source: Ambulatory Visit | Attending: Physician Assistant | Admitting: Physician Assistant

## 2019-09-24 ENCOUNTER — Other Ambulatory Visit: Payer: Self-pay

## 2019-09-24 DIAGNOSIS — R0602 Shortness of breath: Secondary | ICD-10-CM | POA: Insufficient documentation

## 2019-09-28 ENCOUNTER — Other Ambulatory Visit: Payer: Self-pay

## 2019-09-28 DIAGNOSIS — Z20822 Contact with and (suspected) exposure to covid-19: Secondary | ICD-10-CM

## 2019-09-29 ENCOUNTER — Other Ambulatory Visit: Payer: Self-pay | Admitting: Endocrinology

## 2019-09-29 DIAGNOSIS — E119 Type 2 diabetes mellitus without complications: Secondary | ICD-10-CM

## 2019-09-29 NOTE — Telephone Encounter (Signed)
Per Dr. Loanne Drilling, unable to refill Glimepiride without an appt. Routing this message to the front desk for scheduling purposes.

## 2019-09-29 NOTE — Telephone Encounter (Signed)
Please advise 

## 2019-09-29 NOTE — Telephone Encounter (Signed)
1.  Please schedule f/u appt 2.  Then please refill x 1, pending that appt.  

## 2019-09-30 ENCOUNTER — Other Ambulatory Visit: Payer: Self-pay

## 2019-09-30 LAB — NOVEL CORONAVIRUS, NAA: SARS-CoV-2, NAA: NOT DETECTED

## 2019-10-01 ENCOUNTER — Encounter: Payer: Self-pay | Admitting: Endocrinology

## 2019-10-02 ENCOUNTER — Ambulatory Visit (INDEPENDENT_AMBULATORY_CARE_PROVIDER_SITE_OTHER): Payer: PRIVATE HEALTH INSURANCE | Admitting: Endocrinology

## 2019-10-02 ENCOUNTER — Telehealth: Payer: Self-pay | Admitting: Physician Assistant

## 2019-10-02 ENCOUNTER — Other Ambulatory Visit: Payer: Self-pay

## 2019-10-02 DIAGNOSIS — Z794 Long term (current) use of insulin: Secondary | ICD-10-CM | POA: Diagnosis not present

## 2019-10-02 DIAGNOSIS — E119 Type 2 diabetes mellitus without complications: Secondary | ICD-10-CM

## 2019-10-02 DIAGNOSIS — Z8619 Personal history of other infectious and parasitic diseases: Secondary | ICD-10-CM | POA: Diagnosis not present

## 2019-10-02 MED ORDER — TRESIBA FLEXTOUCH 200 UNIT/ML ~~LOC~~ SOPN: 110.0000 [IU] | PEN_INJECTOR | Freq: Every day | SUBCUTANEOUS | 5 refills | Status: DC

## 2019-10-02 NOTE — Telephone Encounter (Signed)
Forms in your folder for completion

## 2019-10-02 NOTE — Telephone Encounter (Signed)
Pt dropped off FMLA forms, to be completed and faxed to [phone]. She asked that we call her when the forms are completed to picked up.

## 2019-10-02 NOTE — Telephone Encounter (Signed)
Forms are in the bin up front with a charge sheet

## 2019-10-02 NOTE — Progress Notes (Addendum)
Subjective:    Patient ID: [name], female    DOB: 01/21/1961, 58 y.o.   MRN: [id number]  HPI  telehealth visit today via doxy video visit.  Alternatives to telehealth are presented to this patient, and the patient agrees to the telehealth visit. Pt is advised of the cost of the visit, and agrees to this, also.   Patient is at home, and I am at the office.   Persons attending the telehealth visit: the patient and I Pt returns for f/u of diabetes mellitus:  DM type: Insulin-requiring type 2.  Dx'ed: 2017, when she had a cbg of 400 on steroids.  Complications: none.  Therapy: insulin and 4 oral meds.   GDM: never.   DKA: never.  Severe hypoglycemia: never.  Pancreatitis: never.    Other: she is on qd insulin, due to noncompliance.   Interval history:  She tested neg for coronavirus 2 days ago, after pos test a few days prior.  pt states slight sob sensation in the chest, but no assoc cough.  No recent steroids.  Pt says cbg varies from 200-478.  Pt says she does not know why cbg is higher.  3 weeks ago, she increased to 90 units qd.   Past Medical History:  Diagnosis Date  . Anemia   . Arthritis    bilateral knees  . Bronchitis    hx of  . Fibroid tumor    Had partial hysterectomy  . Gallstones   . Migraine    hx of    Past Surgical History:  Procedure Laterality Date  . ABDOMINAL HYSTERECTOMY     partial   . boil removed     . CARPAL TUNNEL RELEASE  04-15-15  . CHOLECYSTECTOMY  Oct 2012  . ELBOW SURGERY  04-15-15  . HAND SURGERY  04-15-15  . Nerve damage  04-15-15  . TOTAL KNEE ARTHROPLASTY  08/01/2012   Procedure: TOTAL KNEE ARTHROPLASTY;  Surgeon: Augustin Schooling, MD;  Location: [facility];  Service: Orthopedics;  Laterality: Right;  RIGHT TOTAL KNEE ARTHROPLASTY  . TRIGGER FINGER RELEASE  04-15-15  . TUBAL LIGATION    . WISDOM TOOTH EXTRACTION      Social History   Socioeconomic History  . Marital status: Single    Spouse name: Not on file  . Number of children:  Not on file  . Years of education: Not on file  . Highest education level: Not on file  Occupational History  . Not on file  Social Needs  . Financial resource strain: Not on file  . Food insecurity    Worry: Not on file    Inability: Not on file  . Transportation needs    Medical: Not on file    Non-medical: Not on file  Tobacco Use  . Smoking status: Former Smoker    Packs/day: 0.25    Years: 20.00    Pack years: 5.00    Types: Cigarettes    Quit date: 12/09/2009    Years since quitting: 9.8  . Smokeless tobacco: Never Used  Substance and Sexual Activity  . Alcohol use: Yes    Alcohol/week: 0.0 standard drinks    Comment: social  . Drug use: No  . Sexual activity: Not Currently  Lifestyle  . Physical activity    Days per week: Not on file    Minutes per session: Not on file  . Stress: Not on file  Relationships  . Social Herbalist on  phone: Not on file    Gets together: Not on file    Attends religious service: Not on file    Active member of club or organization: Not on file    Attends meetings of clubs or organizations: Not on file    Relationship status: Not on file  . Intimate partner violence    Fear of current or ex partner: Not on file    Emotionally abused: Not on file    Physically abused: Not on file    Forced sexual activity: Not on file  Other Topics Concern  . Not on file  Social History Narrative  . Not on file    Current Outpatient Medications on File Prior to Visit  Medication Sig Dispense Refill  . atorvastatin (LIPITOR) 10 MG tablet Take 1 tablet (10 mg total) by mouth daily. 90 tablet 1  . azelastine (ASTELIN) 0.1 % nasal spray Place 2 sprays into both nostrils 2 (two) times daily. Use in each nostril as directed 30 mL 12  . BAYER CONTOUR TEST test strip USE TWICE DAILY AS INSTRUCTED TO CHECK SUGARS. DX E. 11.9 100 each 5  . BLACK COHOSH EXTRACT PO Take by mouth.    . Cinnamon 500 MG capsule Take 1,000 mg by mouth 2 (two) times  daily.    . cyclobenzaprine (FLEXERIL) 10 MG tablet Take 1 tablet (10 mg total) by mouth at bedtime. 90 tablet 1  . Echinacea-Goldenseal (ECHINACEA COMB/GOLDEN SEAL PO) Take by mouth.    . famotidine (PEPCID) 20 MG tablet Take 1 tablet (20 mg total) by mouth 2 (two) times daily. 180 tablet 1  . fluocinonide cream (LIDEX) [id number] % Apply 1 application topically 2 (two) times daily.    . fluticasone (FLONASE) 50 MCG/ACT nasal spray Place 2 sprays into both nostrils daily. 16 g 2  . gabapentin (NEURONTIN) 300 MG capsule Take 1 capsule (300 mg total) by mouth 3 (three) times daily. 270 capsule 1  . glimepiride (AMARYL) 4 MG tablet TAKE 1 TABLET DAILY BEFORE BREAKFAST- NEED APPOINTMENTFOR FURTHER REFILLS 30 tablet 0  . GLUCOSAMINE PO Take 1 tablet by mouth 2 (two) times daily.    Marland Kitchen glucose blood (CONTOUR NEXT TEST) test strip Used to check blood sugars 2x daily. DX CODE E11.9. 100 each 12  . Insulin Pen Needle (BD PEN NEEDLE NANO U/F) 32G X 4 MM MISC Used to give daily insulin injections. 100 each 4  . JARDIANCE 10 MG TABS tablet TAKE 10 MG BY MOUTH DAILY. 90 tablet 4  . levocetirizine (XYZAL) 5 MG tablet TAKE 1 TABLET BY MOUTH EVERY DAY IN THE EVENING 90 tablet 1  . losartan-hydrochlorothiazide (HYZAAR) 50-12.5 MG tablet TAKE 1 TABLET BY MOUTH EVERY DAY 90 tablet 1  . meloxicam (MOBIC) 15 MG tablet TAKE 1 TABLET BY MOUTH EVERY DAY 30 tablet 3  . metFORMIN (GLUCOPHAGE) 500 MG tablet TAKE 1 TABLET (500 MG TOTAL) BY MOUTH 2 (TWO) TIMES DAILY WITH A MEAL. 180 tablet 3  . MICROLET LANCETS MISC USE TO MONITOR GLUCOSE LEVELS TWICE DAILY 100 each 1  . Misc Natural Products (HERBAL ENERGY COMPLEX PO) Take by mouth 2 (two) times daily with a meal.    . Multiple Vitamins-Minerals (WOMENS MULTIVITAMIN PO) Take by mouth daily.    . naproxen (NAPROSYN) 500 MG tablet Take 1 tablet (500 mg total) by mouth 2 (two) times daily with a meal. 20 tablet 0  . Omega-3 1000 MG CAPS Take 1 g by mouth daily.    Marland Kitchen  omeprazole  (PRILOSEC) 20 MG capsule Take 1 capsule (20 mg total) by mouth daily. 90 capsule 1  . ONGLYZA 5 MG TABS tablet TAKE 1 TABLET BY MOUTH EVERY DAY 90 tablet 3  . promethazine-dextromethorphan (PROMETHAZINE-DM) 6.25-15 MG/5ML syrup Take 5 mLs by mouth 4 (four) times daily as needed for cough. 118 mL 0  . vitamin B-12 (CYANOCOBALAMIN) 100 MCG tablet Take 100 mcg by mouth daily.    . vitamin C (ASCORBIC ACID) 500 MG tablet Take 1,000 mg by mouth daily.    . vitamin E 400 UNIT capsule Take 400 Units by mouth daily. Reported on 02/13/2016     No current facility-administered medications on file prior to visit.     Allergies  Allergen Reactions  . Penicillins Hives    Hives   . Tramadol Itching    Family History  Problem Relation Age of Onset  . Diabetes Father 51       Deceased  . Diabetes Mother        Living  . Hyperlipidemia Mother   . Tuberculosis Mother   . Asthma Mother   . Heart disease Maternal Uncle   . Colon cancer Maternal Uncle        dx in 38's  . Diabetes Brother   . Heart disease Maternal Aunt   . Arthritis Other        Maternal Aunts & Uncles  . Diabetes Sister   . Kidney failure Brother 30       Diseased  . Healthy Son        x1  . Breast cancer Cousin 30  . Esophageal cancer Neg Hx   . Rectal cancer Neg Hx   . Stomach cancer Neg Hx     There were no vitals taken for this visit.   Review of Systems She denies hypoglycemia.  She has lost weight since last ov.      Objective:   Physical Exam        Assessment & Plan:  Insulin-requiring type 2 DM: she needs increased rx Coronavirus, new: uncertain what effect, if any, this has on glycemic control  Patient Instructions  I have sent a prescription to your pharmacy, to increase the tresiba to 110 units qd.   Please continue the same other medications for diabetes.   check your blood sugar twice a day.  vary the time of day when you check, between before the 3 meals, and at bedtime.  also check if you  have symptoms of your blood sugar being too high or too low.  please keep a record of the readings and bring it to your next appointment here (or you can bring the meter itself).  You can write it on any piece of paper.  please call us sooner if your blood sugar goes below 70, or if you have a lot of readings over 200.  Please come back for a follow-up appointment in 1 week, in person if possible.

## 2019-10-02 NOTE — Patient Instructions (Addendum)
I have sent a prescription to your pharmacy, to increase the tresiba to 110 units qd.   Please continue the same other medications for diabetes.   check your blood sugar twice a day.  vary the time of day when you check, between before the 3 meals, and at bedtime.  also check if you have symptoms of your blood sugar being too high or too low.  please keep a record of the readings and bring it to your next appointment here (or you can bring the meter itself).  You can write it on any piece of paper.  please call us sooner if your blood sugar goes below 70, or if you have a lot of readings over 200.  Please come back for a follow-up appointment in 1 week, in person if possible.

## 2019-10-05 NOTE — Telephone Encounter (Signed)
Please call patient to see the date she returned to work so I can finish completing her forms.

## 2019-10-05 NOTE — Telephone Encounter (Signed)
Spoke with patient about dates to return back to work. She has not gone back to work due to her breathing not fully resolved and she wanted to handle things with her mother in the hospital. She didn't have an return back to work date. She has been taking it easy but does get easily out of breathe with movement.

## 2019-10-06 NOTE — Telephone Encounter (Signed)
Paperwork completed. She attached a number to fax this too. Please verify that she wants everything faxed to this one number as each set oa papers had a different fax to number listed on them. Her RTW date will be for next Monday

## 2019-10-07 NOTE — Telephone Encounter (Signed)
Spoke with patient and advised that paperwork was completed. Returned date is 10/11/19. Patient did advised that her mother recently passed away today.

## 2019-10-09 ENCOUNTER — Encounter: Payer: Self-pay | Admitting: Endocrinology

## 2019-10-09 ENCOUNTER — Other Ambulatory Visit: Payer: Self-pay

## 2019-10-09 ENCOUNTER — Ambulatory Visit (INDEPENDENT_AMBULATORY_CARE_PROVIDER_SITE_OTHER): Payer: PRIVATE HEALTH INSURANCE | Admitting: Endocrinology

## 2019-10-09 VITALS — BP 124/88 | HR 105 | Ht 66.0 in | Wt 212.4 lb

## 2019-10-09 DIAGNOSIS — E1165 Type 2 diabetes mellitus with hyperglycemia: Secondary | ICD-10-CM | POA: Diagnosis not present

## 2019-10-09 LAB — BASIC METABOLIC PANEL
BUN: 10 mg/dL (ref 6–23)
CO2: 30 mEq/L (ref 19–32)
Calcium: 9.3 mg/dL (ref 8.4–10.5)
Chloride: 105 mEq/L (ref 96–112)
Creatinine, Ser: 0.77 mg/dL (ref 0.40–1.20)
GFR: 92.52 mL/min (ref >60.00)
Glucose, Bld: 73 mg/dL (ref 70–99)
Potassium: 3.6 mEq/L (ref 3.5–5.1)
Sodium: 142 mEq/L (ref 135–145)

## 2019-10-09 LAB — POCT GLYCOSYLATED HEMOGLOBIN (HGB A1C): Hemoglobin A1C: 10.2 % — AB (ref 4.0–5.6)

## 2019-10-09 LAB — TSH: TSH: 1.5 u[IU]/mL (ref 0.35–4.50)

## 2019-10-09 NOTE — Patient Instructions (Addendum)
Blood tests are requested for you today.  We'll let you know about the results.   check your blood sugar twice a day.  vary the time of day when you check, between before the 3 meals, and at bedtime.  also check if you have symptoms of your blood sugar being too high or too low.  please keep a record of the readings and bring it to your next appointment here (or you can bring the meter itself).  You can write it on any piece of paper.  please call us sooner if your blood sugar goes below 70, or if you have a lot of readings over 200.  Please come back for a follow-up appointment in 2-3 months.

## 2019-10-09 NOTE — Progress Notes (Signed)
Subjective:    Patient ID: [name], female    DOB: 05-Oct-1961, 58 y.o.   MRN: [id number]  HPI Pt returns for f/u of diabetes mellitus:  DM type: Insulin-requiring type 2.  Dx'ed: 2017, when she had a cbg of 400 on steroids.  Complications: none.  Therapy: insulin and 4 oral meds.   GDM: never.   DKA: never.  Severe hypoglycemia: never.  Pancreatitis: never.    Other: she is on qd insulin, due to noncompliance.   Interval history: No recent steroids.  Pt says cbg varies from 87-179.  pt states she feels well in general. Past Medical History:  Diagnosis Date  . Anemia   . Arthritis    bilateral knees  . Bronchitis    hx of  . Fibroid tumor    Had partial hysterectomy  . Gallstones   . Migraine    hx of    Past Surgical History:  Procedure Laterality Date  . ABDOMINAL HYSTERECTOMY     partial   . boil removed     . CARPAL TUNNEL RELEASE  04-15-15  . CHOLECYSTECTOMY  Oct 2012  . ELBOW SURGERY  04-15-15  . HAND SURGERY  04-15-15  . Nerve damage  04-15-15  . TOTAL KNEE ARTHROPLASTY  08/01/2012   Procedure: TOTAL KNEE ARTHROPLASTY;  Surgeon: Augustin Schooling, MD;  Location: [facility];  Service: Orthopedics;  Laterality: Right;  RIGHT TOTAL KNEE ARTHROPLASTY  . TRIGGER FINGER RELEASE  04-15-15  . TUBAL LIGATION    . WISDOM TOOTH EXTRACTION      Social History   Socioeconomic History  . Marital status: Single    Spouse name: Not on file  . Number of children: Not on file  . Years of education: Not on file  . Highest education level: Not on file  Occupational History  . Not on file  Social Needs  . Financial resource strain: Not on file  . Food insecurity    Worry: Not on file    Inability: Not on file  . Transportation needs    Medical: Not on file    Non-medical: Not on file  Tobacco Use  . Smoking status: Former Smoker    Packs/day: 0.25    Years: 20.00    Pack years: 5.00    Types: Cigarettes    Quit date: 12/09/2009    Years since quitting: 9.8  .  Smokeless tobacco: Never Used  Substance and Sexual Activity  . Alcohol use: Yes    Alcohol/week: 0.0 standard drinks    Comment: social  . Drug use: No  . Sexual activity: Not Currently  Lifestyle  . Physical activity    Days per week: Not on file    Minutes per session: Not on file  . Stress: Not on file  Relationships  . Social Herbalist on phone: Not on file    Gets together: Not on file    Attends religious service: Not on file    Active member of club or organization: Not on file    Attends meetings of clubs or organizations: Not on file    Relationship status: Not on file  . Intimate partner violence    Fear of current or ex partner: Not on file    Emotionally abused: Not on file    Physically abused: Not on file    Forced sexual activity: Not on file  Other Topics Concern  . Not on file  Social History  Narrative  . Not on file    Current Outpatient Medications on File Prior to Visit  Medication Sig Dispense Refill  . atorvastatin (LIPITOR) 10 MG tablet Take 1 tablet (10 mg total) by mouth daily. 90 tablet 1  . azelastine (ASTELIN) 0.1 % nasal spray Place 2 sprays into both nostrils 2 (two) times daily. Use in each nostril as directed 30 mL 12  . BAYER CONTOUR TEST test strip USE TWICE DAILY AS INSTRUCTED TO CHECK SUGARS. DX E. 11.9 100 each 5  . BLACK COHOSH EXTRACT PO Take by mouth.    . Cinnamon 500 MG capsule Take 1,000 mg by mouth 2 (two) times daily.    . cyclobenzaprine (FLEXERIL) 10 MG tablet Take 1 tablet (10 mg total) by mouth at bedtime. 90 tablet 1  . Echinacea-Goldenseal (ECHINACEA COMB/GOLDEN SEAL PO) Take by mouth.    . famotidine (PEPCID) 20 MG tablet Take 1 tablet (20 mg total) by mouth 2 (two) times daily. 180 tablet 1  . fluocinonide cream (LIDEX) [id number] % Apply 1 application topically 2 (two) times daily.    . fluticasone (FLONASE) 50 MCG/ACT nasal spray Place 2 sprays into both nostrils daily. 16 g 2  . gabapentin (NEURONTIN) 300 MG  capsule Take 1 capsule (300 mg total) by mouth 3 (three) times daily. 270 capsule 1  . GLUCOSAMINE PO Take 1 tablet by mouth 2 (two) times daily.    Marland Kitchen glucose blood (CONTOUR NEXT TEST) test strip Used to check blood sugars 2x daily. DX CODE E11.9. 100 each 12  . Insulin Degludec (TRESIBA FLEXTOUCH) 200 UNIT/ML SOPN Inject 110 Units into the skin daily. 6 pen 5  . Insulin Pen Needle (BD PEN NEEDLE NANO U/F) 32G X 4 MM MISC Used to give daily insulin injections. 100 each 4  . JARDIANCE 10 MG TABS tablet TAKE 10 MG BY MOUTH DAILY. 90 tablet 4  . levocetirizine (XYZAL) 5 MG tablet TAKE 1 TABLET BY MOUTH EVERY DAY IN THE EVENING 90 tablet 1  . losartan-hydrochlorothiazide (HYZAAR) 50-12.5 MG tablet TAKE 1 TABLET BY MOUTH EVERY DAY 90 tablet 1  . meloxicam (MOBIC) 15 MG tablet TAKE 1 TABLET BY MOUTH EVERY DAY 30 tablet 3  . metFORMIN (GLUCOPHAGE) 500 MG tablet TAKE 1 TABLET (500 MG TOTAL) BY MOUTH 2 (TWO) TIMES DAILY WITH A MEAL. 180 tablet 3  . MICROLET LANCETS MISC USE TO MONITOR GLUCOSE LEVELS TWICE DAILY 100 each 1  . Misc Natural Products (HERBAL ENERGY COMPLEX PO) Take by mouth 2 (two) times daily with a meal.    . Multiple Vitamins-Minerals (WOMENS MULTIVITAMIN PO) Take by mouth daily.    . naproxen (NAPROSYN) 500 MG tablet Take 1 tablet (500 mg total) by mouth 2 (two) times daily with a meal. 20 tablet 0  . Omega-3 1000 MG CAPS Take 1 g by mouth daily.    Marland Kitchen omeprazole (PRILOSEC) 20 MG capsule Take 1 capsule (20 mg total) by mouth daily. 90 capsule 1  . ONGLYZA 5 MG TABS tablet TAKE 1 TABLET BY MOUTH EVERY DAY 90 tablet 3  . promethazine-dextromethorphan (PROMETHAZINE-DM) 6.25-15 MG/5ML syrup Take 5 mLs by mouth 4 (four) times daily as needed for cough. 118 mL 0  . vitamin B-12 (CYANOCOBALAMIN) 100 MCG tablet Take 100 mcg by mouth daily.    . vitamin C (ASCORBIC ACID) 500 MG tablet Take 1,000 mg by mouth daily.    . vitamin E 400 UNIT capsule Take 400 Units by mouth daily. Reported on 02/13/2016  No current facility-administered medications on file prior to visit.     Allergies  Allergen Reactions  . Penicillins Hives    Hives   . Tramadol Itching    Family History  Problem Relation Age of Onset  . Diabetes Father 33       Deceased  . Diabetes Mother        Living  . Hyperlipidemia Mother   . Tuberculosis Mother   . Asthma Mother   . Heart disease Maternal Uncle   . Colon cancer Maternal Uncle        dx in 38's  . Diabetes Brother   . Heart disease Maternal Aunt   . Arthritis Other        Maternal Aunts & Uncles  . Diabetes Sister   . Kidney failure Brother 30       Diseased  . Healthy Son        x1  . Breast cancer Cousin 3  . Esophageal cancer Neg Hx   . Rectal cancer Neg Hx   . Stomach cancer Neg Hx     BP 124/88 (BP Location: Left Arm, Patient Position: Sitting, Cuff Size: Large)   Pulse (!) 105   Ht 5\' 6"  (1.676 m)   Wt 212 lb 6.4 oz (96.3 kg)   SpO2 90%   BMI 34.28 kg/m    Review of Systems She denies hypoglycemia    Objective:   Physical Exam VITAL SIGNS:  See vs page GENERAL: no distress Pulses: dorsalis pedis intact bilat.   MSK: no deformity of the feet CV: no leg edema Skin:  no ulcer on the feet.  normal color and temp on the feet. Neuro: sensation is intact to touch on the feet Ext: there is bilateral onychomycosis of the toenails.   Lab Results  Component Value Date   HGBA1C 10.2 (A) 10/09/2019      Assessment & Plan:  Insulin-requiring type 2 DM: poor glycemic control Discordance between a1c and reported cbg's.  Check fructosamine.    Patient Instructions  Blood tests are requested for you today.  We'll let you know about the results.   check your blood sugar twice a day.  vary the time of day when you check, between before the 3 meals, and at bedtime.  also check if you have symptoms of your blood sugar being too high or too low.  please keep a record of the readings and bring it to your next appointment here (or  you can bring the meter itself).  You can write it on any piece of paper.  please call us sooner if your blood sugar goes below 70, or if you have a lot of readings over 200.  Please come back for a follow-up appointment in 2-3 months.

## 2019-10-13 ENCOUNTER — Ambulatory Visit: Payer: PRIVATE HEALTH INSURANCE | Admitting: Endocrinology

## 2019-10-16 LAB — FRUCTOSAMINE: Fructosamine: 291 umol/L — ABNORMAL HIGH (ref 205–285)

## 2019-10-21 ENCOUNTER — Encounter: Payer: Self-pay | Admitting: Physician Assistant

## 2019-10-21 ENCOUNTER — Ambulatory Visit: Payer: PRIVATE HEALTH INSURANCE | Admitting: Physician Assistant

## 2019-10-21 ENCOUNTER — Ambulatory Visit (INDEPENDENT_AMBULATORY_CARE_PROVIDER_SITE_OTHER): Payer: PRIVATE HEALTH INSURANCE | Admitting: Physician Assistant

## 2019-10-21 ENCOUNTER — Other Ambulatory Visit: Payer: Self-pay

## 2019-10-21 VITALS — BP 130/80 | HR 90 | Temp 97.8°F | Resp 16 | Ht 66.0 in | Wt 214.0 lb

## 2019-10-21 DIAGNOSIS — R609 Edema, unspecified: Secondary | ICD-10-CM | POA: Diagnosis not present

## 2019-10-21 DIAGNOSIS — R0602 Shortness of breath: Secondary | ICD-10-CM

## 2019-10-21 MED ORDER — FUROSEMIDE 20 MG PO TABS: 20.0000 mg | ORAL_TABLET | Freq: Every day | ORAL | 1 refills | Status: DC

## 2019-10-21 NOTE — Progress Notes (Signed)
Patient presents to clinic today c/o bilateral foot and ankle swelling. Initially noticed 4 days ago, was unable to put on her shoes. Swelling has decreased a little over the past few days. Denies pain, erythema, bruising. Tested positive for COVID on 09/08/19, endorses some continued shortness of breath with activity and going up and down stairs, resolves after sitting for 5 minutes. Denies chest pain, palpitations, lightheadedness, dizziness. Has been elevating her feet at night, swelling is improved in morning, gets worse throughout day. No changes in footwear or physical activity. Eats well-balanced diet, eats fresh or frozen vegetables, avoids canned products.    Past Medical History:  Diagnosis Date  . Anemia   . Arthritis    bilateral knees  . Bronchitis    hx of  . Fibroid tumor    Had partial hysterectomy  . Gallstones   . Migraine    hx of    Current Outpatient Medications on File Prior to Visit  Medication Sig Dispense Refill  . atorvastatin (LIPITOR) 10 MG tablet Take 1 tablet (10 mg total) by mouth daily. 90 tablet 1  . azelastine (ASTELIN) 0.1 % nasal spray Place 2 sprays into both nostrils 2 (two) times daily. Use in each nostril as directed 30 mL 12  . BAYER CONTOUR TEST test strip USE TWICE DAILY AS INSTRUCTED TO CHECK SUGARS. DX E. 11.9 100 each 5  . BLACK COHOSH EXTRACT PO Take by mouth.    . Cinnamon 500 MG capsule Take 1,000 mg by mouth 2 (two) times daily.    . cyclobenzaprine (FLEXERIL) 10 MG tablet Take 1 tablet (10 mg total) by mouth at bedtime. 90 tablet 1  . Echinacea-Goldenseal (ECHINACEA COMB/GOLDEN SEAL PO) Take by mouth.    . famotidine (PEPCID) 20 MG tablet Take 1 tablet (20 mg total) by mouth 2 (two) times daily. 180 tablet 1  . fluocinonide cream (LIDEX) [id number] % Apply 1 application topically 2 (two) times daily.    . fluticasone (FLONASE) 50 MCG/ACT nasal spray Place 2 sprays into both nostrils daily. 16 g 2  . gabapentin (NEURONTIN) 300 MG capsule Take  1 capsule (300 mg total) by mouth 3 (three) times daily. 270 capsule 1  . GLUCOSAMINE PO Take 1 tablet by mouth 2 (two) times daily.    Marland Kitchen glucose blood (CONTOUR NEXT TEST) test strip Used to check blood sugars 2x daily. DX CODE E11.9. 100 each 12  . Insulin Degludec (TRESIBA FLEXTOUCH) 200 UNIT/ML SOPN Inject 110 Units into the skin daily. 6 pen 5  . Insulin Pen Needle (BD PEN NEEDLE NANO U/F) 32G X 4 MM MISC Used to give daily insulin injections. 100 each 4  . JARDIANCE 10 MG TABS tablet TAKE 10 MG BY MOUTH DAILY. 90 tablet 4  . levocetirizine (XYZAL) 5 MG tablet TAKE 1 TABLET BY MOUTH EVERY DAY IN THE EVENING 90 tablet 1  . losartan-hydrochlorothiazide (HYZAAR) 50-12.5 MG tablet TAKE 1 TABLET BY MOUTH EVERY DAY 90 tablet 1  . meloxicam (MOBIC) 15 MG tablet TAKE 1 TABLET BY MOUTH EVERY DAY 30 tablet 3  . metFORMIN (GLUCOPHAGE) 500 MG tablet TAKE 1 TABLET (500 MG TOTAL) BY MOUTH 2 (TWO) TIMES DAILY WITH A MEAL. 180 tablet 3  . MICROLET LANCETS MISC USE TO MONITOR GLUCOSE LEVELS TWICE DAILY 100 each 1  . Misc Natural Products (HERBAL ENERGY COMPLEX PO) Take by mouth 2 (two) times daily with a meal.    . Multiple Vitamins-Minerals (WOMENS MULTIVITAMIN PO) Take by mouth daily.    Marland Kitchen  naproxen (NAPROSYN) 500 MG tablet Take 1 tablet (500 mg total) by mouth 2 (two) times daily with a meal. 20 tablet 0  . Omega-3 1000 MG CAPS Take 1 g by mouth daily.    Marland Kitchen omeprazole (PRILOSEC) 20 MG capsule Take 1 capsule (20 mg total) by mouth daily. 90 capsule 1  . ONGLYZA 5 MG TABS tablet TAKE 1 TABLET BY MOUTH EVERY DAY 90 tablet 3  . promethazine-dextromethorphan (PROMETHAZINE-DM) 6.25-15 MG/5ML syrup Take 5 mLs by mouth 4 (four) times daily as needed for cough. 118 mL 0  . vitamin B-12 (CYANOCOBALAMIN) 100 MCG tablet Take 100 mcg by mouth daily.    . vitamin C (ASCORBIC ACID) 500 MG tablet Take 1,000 mg by mouth daily.    . vitamin E 400 UNIT capsule Take 400 Units by mouth daily. Reported on 02/13/2016     No  current facility-administered medications on file prior to visit.     Allergies  Allergen Reactions  . Penicillins Hives    Hives   . Tramadol Itching    Family History  Problem Relation Age of Onset  . Diabetes Father 77       Deceased  . Diabetes Mother        Living  . Hyperlipidemia Mother   . Tuberculosis Mother   . Asthma Mother   . Heart disease Maternal Uncle   . Colon cancer Maternal Uncle        dx in 47's  . Diabetes Brother   . Heart disease Maternal Aunt   . Arthritis Other        Maternal Aunts & Uncles  . Diabetes Sister   . Kidney failure Brother 30       Diseased  . Healthy Son        x1  . Breast cancer Cousin 13  . Esophageal cancer Neg Hx   . Rectal cancer Neg Hx   . Stomach cancer Neg Hx     Social History   Socioeconomic History  . Marital status: Single    Spouse name: Not on file  . Number of children: Not on file  . Years of education: Not on file  . Highest education level: Not on file  Occupational History  . Not on file  Social Needs  . Financial resource strain: Not on file  . Food insecurity    Worry: Not on file    Inability: Not on file  . Transportation needs    Medical: Not on file    Non-medical: Not on file  Tobacco Use  . Smoking status: Former Smoker    Packs/day: 0.25    Years: 20.00    Pack years: 5.00    Types: Cigarettes    Quit date: 12/09/2009    Years since quitting: 9.8  . Smokeless tobacco: Never Used  Substance and Sexual Activity  . Alcohol use: Yes    Alcohol/week: 0.0 standard drinks    Comment: social  . Drug use: No  . Sexual activity: Not Currently  Lifestyle  . Physical activity    Days per week: Not on file    Minutes per session: Not on file  . Stress: Not on file  Relationships  . Social Herbalist on phone: Not on file    Gets together: Not on file    Attends religious service: Not on file    Active member of club or organization: Not on file    Attends meetings of  clubs or organizations: Not on file    Relationship status: Not on file  Other Topics Concern  . Not on file  Social History Narrative  . Not on file   Review of Systems - See HPI.  All other ROS are negative.  Resp 16   Ht 5\' 6"  (1.676 m)   Wt 214 lb (97.1 kg)   BMI 34.54 kg/m   Physical Exam  Recent Results (from the past 2160 hour(s))  Novel Coronavirus, NAA ([facility])     Status: Abnormal   Collection Time: 09/08/19 12:00 AM   Specimen: Oropharyngeal(OP) collection in vial transport medium   OROPHARYNGEA  TESTING  Result Value Ref Range   SARS-CoV-2, NAA Detected (A) Not Detected    Comment: Testing was performed using the cobas(R) SARS-CoV-2 test. This nucleic acid amplification test was developed and its performance characteristics determined by [employer]. Nucleic acid amplification tests include PCR and TMA. This test has not been FDA cleared or approved. This test has been authorized by FDA under an Emergency Use Authorization (EUA). This test is only authorized for the duration of time the declaration that circumstances exist justifying the authorization of the emergency use of in vitro diagnostic tests for detection of SARS-CoV-2 virus and/or diagnosis of COVID-19 infection under section 564(b)(1) of the Act, 21 U.S.C. GF:7541899) (1), unless the authorization is terminated or revoked sooner. When diagnostic testing is negative, the possibility of a false negative result should be considered in the context of a patient's recent exposures and the presence of clinical signs and symptoms consistent with COVID-19. An individual without symptoms  of COVID-19 and who is not shedding SARS-CoV-2 virus would expect to have a negative (not detected) result in this assay.   Novel Coronavirus, NAA ([facility])     Status: None   Collection Time: 09/28/19 11:07 AM   Specimen: Nasopharyngeal(NP) swabs in vial transport medium   NASOPHARYNGE  TESTING  Result Value  Ref Range   SARS-CoV-2, NAA Not Detected Not Detected    Comment: This nucleic acid amplification test was developed and its performance characteristics determined by [employer]. Nucleic acid amplification tests include PCR and TMA. This test has not been FDA cleared or approved. This test has been authorized by FDA under an Emergency Use Authorization (EUA). This test is only authorized for the duration of time the declaration that circumstances exist justifying the authorization of the emergency use of in vitro diagnostic tests for detection of SARS-CoV-2 virus and/or diagnosis of COVID-19 infection under section 564(b)(1) of the Act, 21 U.S.C. GF:7541899) (1), unless the authorization is terminated or revoked sooner. When diagnostic testing is negative, the possibility of a false negative result should be considered in the context of a patient's recent exposures and the presence of clinical signs and symptoms consistent with COVID-19. An individual without symptoms of COVID-19 and who is not shedding SARS-CoV-2 virus would  expect to have a negative (not detected) result in this assay.   POCT HgB A1C     Status: Abnormal   Collection Time: 10/09/19  8:49 AM  Result Value Ref Range   Hemoglobin A1C 10.2 (A) 4.0 - 5.6 %   HbA1c POC (<> result, manual entry)     HbA1c, POC (prediabetic range)     HbA1c, POC (controlled diabetic range)    TSH     Status: None   Collection Time: 10/09/19  9:09 AM  Result Value Ref Range   TSH 1.50 0.35 - 4.50 uIU/mL  Basic  metabolic panel     Status: None   Collection Time: 10/09/19  9:09 AM  Result Value Ref Range   Sodium 142 135 - 145 mEq/L   Potassium 3.6 3.5 - 5.1 mEq/L   Chloride 105 96 - 112 mEq/L   CO2 30 19 - 32 mEq/L   Glucose, Bld 73 70 - 99 mg/dL   BUN 10 6 - 23 mg/dL   Creatinine, Ser 0.77 0.40 - 1.20 mg/dL   Calcium 9.3 8.4 - 10.5 mg/dL   GFR 92.52 >60.00 mL/min  Fructosamine     Status: Abnormal   Collection Time:  10/09/19  9:09 AM  Result Value Ref Range   Fructosamine 291 (H) 205 - 285 umol/L   Assessment/Plan: 1. SOBOE (shortness of breath on exertion) 2. Peripheral edema Mild peripheral edema today but this is new for her. Has an overall very good, low-salt diet. Start Lasix 20 mg QD. Elevate legs while resting. Recent BMP within normal limits. Lungs CTAB today. No murmur auscultated on examination. Giving onset of SOBOE and swelling after COVID infection will obtain echo to make sure no change in systolic or diastolic function.  - ECHOCARDIOGRAM COMPLETE; Future   Leeanne Rio, PA-C

## 2019-10-21 NOTE — Patient Instructions (Signed)
Please start the Furosemide once daily for fluid. Elevate legs while resting. Keep low salt diet. You will be contacted for an Echocardiogram to further assess and make sure symptoms are not stemming from a heart change from COVID.  Please start PureZZZs OTC at night for sleep.   Sleep Hygiene  Do: (1) Go to bed at the same time each day. (2) Get up from bed at the same time each day. (3) Get regular exercise each day, preferably in the morning.  There is goof evidence that regular exercise improves restful sleep.  This includes stretching and aerobic exercise. (4) Get regular exposure to outdoor or bright lights, especially in the late afternoon. (5) Keep the temperature in your bedroom comfortable. (6) Keep the bedroom quiet when sleeping. (7) Keep the bedroom dark enough to facilitate sleep. (8) Use your bed only for sleep and sex. (9) Take medications as directed.  It is helpful to take prescribed sleeping pills 1 hour before bedtime, so they are causing drowsiness when you lie down, or 10 hours before getting up, to avoid daytime drowsiness. (10) Use a relaxation exercise just before going to sleep -- imagery, massage, warm bath. (11) Keep your feet and hands warm.  Wear warm socks and/or mittens or gloves to bed.  Don't: (1) Exercise just before going to bed. (2) Engage in stimulating activity just before bed, such as playing a competitive game, watching an exciting program on television, or having an important discussion with a loved one. (3) Have caffeine in the evening (coffee, teas, chocolate, sodas, etc.) (4) Read or watch television in bed. (5) Use alcohol to help you sleep. (6) Go to bed too hungry or too full. (7) Take another person's sleeping pills. (8) Take over-the-counter sleeping pills, without your doctor's knowledge.  Tolerance can develop rapidly with these medications.  Diphenhydramine can have serious side effects for elderly patients. (9) Take daytime  naps. (10) Command yourself to go to sleep.  This only makes your mind and body more alert.  If you lie awake for more than 20-30 minutes, get up, go to a different room, participate in a quiet activity (Ex - non-excitable reading or television), and then return to bed when you feel sleepy.  Do this as many times during the night as needed.  This may cause you to have a night or two of poor sleep but it will train your brain to know when it is time for sleep.

## 2019-10-22 ENCOUNTER — Ambulatory Visit (HOSPITAL_COMMUNITY): Payer: PRIVATE HEALTH INSURANCE | Attending: Cardiology

## 2019-10-22 DIAGNOSIS — R609 Edema, unspecified: Secondary | ICD-10-CM | POA: Diagnosis not present

## 2019-10-22 DIAGNOSIS — R0602 Shortness of breath: Secondary | ICD-10-CM | POA: Diagnosis not present

## 2019-10-23 ENCOUNTER — Other Ambulatory Visit: Payer: Self-pay

## 2019-10-23 DIAGNOSIS — Z20822 Contact with and (suspected) exposure to covid-19: Secondary | ICD-10-CM

## 2019-10-26 LAB — NOVEL CORONAVIRUS, NAA: SARS-CoV-2, NAA: NOT DETECTED

## 2019-10-29 ENCOUNTER — Other Ambulatory Visit: Payer: Self-pay | Admitting: Emergency Medicine

## 2019-10-29 DIAGNOSIS — I5189 Other ill-defined heart diseases: Secondary | ICD-10-CM

## 2019-10-29 DIAGNOSIS — R0602 Shortness of breath: Secondary | ICD-10-CM

## 2019-10-29 DIAGNOSIS — I517 Cardiomegaly: Secondary | ICD-10-CM

## 2019-11-04 ENCOUNTER — Encounter: Payer: Self-pay | Admitting: Cardiology

## 2019-11-04 ENCOUNTER — Ambulatory Visit (INDEPENDENT_AMBULATORY_CARE_PROVIDER_SITE_OTHER): Payer: PRIVATE HEALTH INSURANCE | Admitting: Cardiology

## 2019-11-04 ENCOUNTER — Other Ambulatory Visit: Payer: Self-pay

## 2019-11-04 VITALS — BP 128/90 | HR 87 | Ht 66.0 in | Wt 207.0 lb

## 2019-11-04 DIAGNOSIS — E782 Mixed hyperlipidemia: Secondary | ICD-10-CM

## 2019-11-04 DIAGNOSIS — E669 Obesity, unspecified: Secondary | ICD-10-CM

## 2019-11-04 DIAGNOSIS — I5189 Other ill-defined heart diseases: Secondary | ICD-10-CM

## 2019-11-04 DIAGNOSIS — I10 Essential (primary) hypertension: Secondary | ICD-10-CM | POA: Diagnosis not present

## 2019-11-04 DIAGNOSIS — E119 Type 2 diabetes mellitus without complications: Secondary | ICD-10-CM

## 2019-11-04 DIAGNOSIS — Z794 Long term (current) use of insulin: Secondary | ICD-10-CM

## 2019-11-04 HISTORY — DX: Obesity, unspecified: E66.9

## 2019-11-04 HISTORY — DX: Other ill-defined heart diseases: I51.89

## 2019-11-04 MED ORDER — LOSARTAN POTASSIUM 25 MG PO TABS: 25.0000 mg | ORAL_TABLET | Freq: Every day | ORAL | 1 refills | Status: DC

## 2019-11-04 NOTE — Progress Notes (Signed)
Cardiology Office Note:    Date:  11/04/2019   ID:  [name], DOB [birth year], MRN [id number]  PCP:  Sharon Jeans, PA-C  Cardiologist:  Sharon Salines, DO  Electrophysiologist:  None   Referring MD: Sharon Jeans, PA-C   Chief Complaint  Patient presents with  . abnormal echo   History of Present Illness:    [name] is a 58 y.o. female with a hx of hypertension , DM and hyperlipidemia and recently covered from COVID-19 was referred by her pcp for abnormal echocardiogram. The patient denies any chest pain, shortness of breath, lightheadedness and dizziness.   Past Medical History:  Diagnosis Date  . Anemia   . Arthritis    bilateral knees  . Bronchitis    hx of  . Fibroid tumor    Had partial hysterectomy  . Gallstones   . Migraine    hx of    Past Surgical History:  Procedure Laterality Date  . ABDOMINAL HYSTERECTOMY     partial   . boil removed     . CARPAL TUNNEL RELEASE  04-15-15  . CHOLECYSTECTOMY  Oct 2012  . ELBOW SURGERY  04-15-15  . HAND SURGERY  04-15-15  . Nerve damage  04-15-15  . TOTAL KNEE ARTHROPLASTY  08/01/2012   Procedure: TOTAL KNEE ARTHROPLASTY;  Surgeon: Augustin Schooling, MD;  Location: [facility];  Service: Orthopedics;  Laterality: Right;  RIGHT TOTAL KNEE ARTHROPLASTY  . TRIGGER FINGER RELEASE  04-15-15  . TUBAL LIGATION    . WISDOM TOOTH EXTRACTION      Current Medications: Current Meds  Medication Sig  . atorvastatin (LIPITOR) 10 MG tablet Take 1 tablet (10 mg total) by mouth daily.  Marland Kitchen azelastine (ASTELIN) 0.1 % nasal spray Place 2 sprays into both nostrils 2 (two) times daily. Use in each nostril as directed  . BAYER CONTOUR TEST test strip USE TWICE DAILY AS INSTRUCTED TO CHECK SUGARS. DX E. 11.9  . BLACK COHOSH EXTRACT PO Take by mouth.  . Cinnamon 500 MG capsule Take 1,000 mg by mouth 2 (two) times daily.  . cyclobenzaprine (FLEXERIL) 10 MG tablet Take 1 tablet (10 mg total) by mouth at bedtime.  Marland Kitchen Echinacea-Goldenseal  (ECHINACEA COMB/GOLDEN SEAL PO) Take by mouth.  . fluocinonide cream (LIDEX) [id number] % Apply 1 application topically 2 (two) times daily.  . fluticasone (FLONASE) 50 MCG/ACT nasal spray Place 2 sprays into both nostrils daily.  . furosemide (LASIX) 20 MG tablet Take 1 tablet (20 mg total) by mouth daily.  Marland Kitchen gabapentin (NEURONTIN) 300 MG capsule Take 1 capsule (300 mg total) by mouth 3 (three) times daily.  Marland Kitchen GLUCOSAMINE PO Take 1 tablet by mouth 2 (two) times daily.  Marland Kitchen glucose blood (CONTOUR NEXT TEST) test strip Used to check blood sugars 2x daily. DX CODE E11.9.  . Insulin Degludec (TRESIBA FLEXTOUCH) 200 UNIT/ML SOPN Inject 110 Units into the skin daily.  . Insulin Pen Needle (BD PEN NEEDLE NANO U/F) 32G X 4 MM MISC Used to give daily insulin injections.  Marland Kitchen JARDIANCE 10 MG TABS tablet TAKE 10 MG BY MOUTH DAILY.  Marland Kitchen levocetirizine (XYZAL) 5 MG tablet TAKE 1 TABLET BY MOUTH EVERY DAY IN THE EVENING  . meloxicam (MOBIC) 15 MG tablet TAKE 1 TABLET BY MOUTH EVERY DAY  . metFORMIN (GLUCOPHAGE) 500 MG tablet TAKE 1 TABLET (500 MG TOTAL) BY MOUTH 2 (TWO) TIMES DAILY WITH A MEAL.  Marland Kitchen MICROLET LANCETS MISC USE TO MONITOR GLUCOSE LEVELS  TWICE DAILY  . Misc Natural Products (HERBAL ENERGY COMPLEX PO) Take by mouth 2 (two) times daily with a meal.  . Multiple Vitamins-Minerals (WOMENS MULTIVITAMIN PO) Take by mouth daily.  . naproxen (NAPROSYN) 500 MG tablet Take 1 tablet (500 mg total) by mouth 2 (two) times daily with a meal.  . Omega-3 1000 MG CAPS Take 1 g by mouth daily.  Marland Kitchen omeprazole (PRILOSEC) 20 MG capsule Take 1 capsule (20 mg total) by mouth daily.  . ONGLYZA 5 MG TABS tablet TAKE 1 TABLET BY MOUTH EVERY DAY  . promethazine-dextromethorphan (PROMETHAZINE-DM) 6.25-15 MG/5ML syrup Take 5 mLs by mouth 4 (four) times daily as needed for cough.  . vitamin B-12 (CYANOCOBALAMIN) 100 MCG tablet Take 100 mcg by mouth daily.  . vitamin C (ASCORBIC ACID) 500 MG tablet Take 1,000 mg by mouth daily.  . vitamin  E 400 UNIT capsule Take 400 Units by mouth daily. Reported on 02/13/2016  . [DISCONTINUED] famotidine (PEPCID) 20 MG tablet Take 1 tablet (20 mg total) by mouth 2 (two) times daily.     Allergies:   Penicillins and Tramadol   Social History   Socioeconomic History  . Marital status: Single    Spouse name: Not on file  . Number of children: Not on file  . Years of education: Not on file  . Highest education level: Not on file  Occupational History  . Not on file  Social Needs  . Financial resource strain: Not on file  . Food insecurity    Worry: Not on file    Inability: Not on file  . Transportation needs    Medical: Not on file    Non-medical: Not on file  Tobacco Use  . Smoking status: Former Smoker    Packs/day: 0.25    Years: 20.00    Pack years: 5.00    Types: Cigarettes    Quit date: 12/09/2009    Years since quitting: 9.9  . Smokeless tobacco: Never Used  Substance and Sexual Activity  . Alcohol use: Yes    Alcohol/week: 0.0 standard drinks    Comment: social  . Drug use: No  . Sexual activity: Not Currently  Lifestyle  . Physical activity    Days per week: Not on file    Minutes per session: Not on file  . Stress: Not on file  Relationships  . Social Herbalist on phone: Not on file    Gets together: Not on file    Attends religious service: Not on file    Active member of club or organization: Not on file    Attends meetings of clubs or organizations: Not on file    Relationship status: Not on file  Other Topics Concern  . Not on file  Social History Narrative  . Not on file     Family History: The patient's family history includes Arthritis in an other family member; Asthma in her mother; Breast cancer (age of onset: 21) in her cousin; Colon cancer in her maternal uncle; Diabetes in her brother, mother, and sister; Diabetes (age of onset: 94) in her father; Healthy in her son; Heart disease in her maternal aunt and maternal uncle;  Hyperlipidemia in her mother; Kidney failure (age of onset: 28) in her brother; Tuberculosis in her mother. There is no history of Esophageal cancer, Rectal cancer, or Stomach cancer.  ROS:   Review of Systems  Constitution: Negative for decreased appetite, fever and weight gain.  HENT: Negative  for congestion, ear discharge, hoarse voice and sore throat.   Eyes: Negative for discharge, redness, vision loss in right eye and visual halos.  Cardiovascular: Negative for chest pain, dyspnea on exertion, leg swelling, orthopnea and palpitations.  Respiratory: Negative for cough, hemoptysis, shortness of breath and snoring.   Endocrine: Negative for heat intolerance and polyphagia.  Hematologic/Lymphatic: Negative for bleeding problem. Does not bruise/bleed easily.  Skin: Negative for flushing, nail changes, rash and suspicious lesions.  Musculoskeletal: Negative for arthritis, joint pain, muscle cramps, myalgias, neck pain and stiffness.  Gastrointestinal: Negative for abdominal pain, bowel incontinence, diarrhea and excessive appetite.  Genitourinary: Negative for decreased libido, genital sores and incomplete emptying.  Neurological: Negative for brief paralysis, focal weakness, headaches and loss of balance.  Psychiatric/Behavioral: Negative for altered mental status, depression and suicidal ideas.  Allergic/Immunologic: Negative for HIV exposure and persistent infections.    EKGs/Labs/Other Studies Reviewed:    The following studies were reviewed today:   EKG:  The ekg ordered today demonstrates sinus rhythm, heart rate 91 bpm with PACs with nonspecific st changes.  TTE 10/22/2019 1. The average left ventricular global longitudinal strain is mildly abnormal at -14.7 %.  2. Left ventricular ejection fraction, by visual estimation, is 55%. The left ventricle has normal function. Moderately increased left ventricular posterior wall thickness. There is severely increased left ventricular  hypertrophy of the basal septum.  3. Left ventricular diastolic parameters are consistent with Grade I diastolic dysfunction (impaired relaxation).  4. Global right ventricle has normal systolic function.The right ventricular size is normal. No increase in right ventricular wall thickness.  5. Left atrial size was normal.  6. Right atrial size was normal.  7. The mitral valve is normal in structure. Trace mitral valve regurgitation. No evidence of mitral stenosis.  8. The tricuspid valve is normal in structure. Tricuspid valve regurgitation is not demonstrated.  9. The aortic valve is normal in structure. Aortic valve regurgitation is not visualized. No evidence of aortic valve sclerosis or stenosis. 10. The pulmonic valve was normal in structure. Pulmonic valve regurgitation is not visualized.  Rec/ent Labs: 10/09/2019: BUN 10; Creatinine, Ser 0.77; Potassium 3.6; Sodium 142; TSH 1.50  Recent Lipid Panel    Component Value Date/Time   CHOL 142 03/11/2018 0920   TRIG 61.0 03/11/2018 0920   HDL 60.70 03/11/2018 0920   CHOLHDL 2 03/11/2018 0920   VLDL 12.2 03/11/2018 0920   LDLCALC 69 03/11/2018 0920    Physical Exam:    VS:  BP 110/90 (BP Location: Left Arm, Patient Position: Sitting, Cuff Size: Normal)   Pulse 87   Ht 5\' 6"  (1.676 m)   Wt 207 lb (93.9 kg)   SpO2 95%   BMI 33.41 kg/m     Wt Readings from Last 3 Encounters:  11/04/19 207 lb (93.9 kg)  10/21/19 214 lb (97.1 kg)  10/09/19 212 lb 6.4 oz (96.3 kg)    GEN: Well nourished, well developed in no acute distress HEENT: Normal NECK: No JVD; No carotid bruits LYMPHATICS: No lymphadenopathy CARDIAC: S1S2 noted,RRR, no murmurs, rubs, gallops RESPIRATORY:  Clear to auscultation without rales, wheezing or rhonchi  ABDOMEN: Soft, non-tender, non-distended, +bowel sounds, no guarding. EXTREMITIES: No edema, No cyanosis, no clubbing MUSCULOSKELETAL:  No edema; No deformity  SKIN: Warm and dry NEUROLOGIC:  Alert and  oriented x 3, non-focal PSYCHIATRIC:  Normal affect, good insight  ASSESSMENT:    1. Essential hypertension   2. Mixed hyperlipidemia   3. Diastolic dysfunction  4. Type 2 diabetes mellitus without complication, with long-term current use of insulin (HCC)   5. Obesity (BMI 30-39.9)    PLAN:    1. She does have some diastolic hypertension in the office today. With evidence of LVH on her TTE , we need strict bp control to avoid worsening LVH. Therefore at this time I will start the patient on losartan 25mg  daily. She already takes lasix 20mg  daily.   2. DM per pcp.  3. Hyperlipidemia - continue Lipitor 10 mg. Will repeat lipid profile and adjust statin as appropriate.  4.  Obesity-the patient understands the need to lose weight with diet and exercise. We have discussed specific strategies for this.  The patient is in agreement with the above plan. The patient left the office in stable condition.  The patient will follow up in 3 months.   Medication Adjustments/Labs and Tests Ordered: Current medicines are reviewed at length with the patient today.  Concerns regarding medicines are outlined above.  Orders Placed This Encounter  Procedures  . Lipid Profile  . EKG 12-Lead   Meds ordered this encounter  Medications  . losartan (COZAAR) 25 MG tablet    Sig: Take 1 tablet (25 mg total) by mouth daily.    Dispense:  90 tablet    Refill:  1    Patient Instructions  Medication Instructions:  Your physician has recommended you make the following change in your medication:  START Losartan 25mg  (one tablet) daily  *If you need a refill on your cardiac medications before your next appointment, please call your pharmacy*  Lab Work: Your physician recommends that you return for lab work today: lipid panel  If you have labs (blood work) drawn today and your tests are completely normal, you will receive your results only by: Marland Kitchen MyChart Message (if you have MyChart) OR . A paper copy  in the mail If you have any lab test that is abnormal or we need to change your treatment, we will call you to review the results.  Testing/Procedures: You had an EKG today.  Follow-Up: At [facility], you and your health needs are our priority.  As part of our continuing mission to provide you with exceptional heart care, we have created designated Provider Care Teams.  These Care Teams include your primary Cardiologist (physician) and Advanced Practice Providers (APPs -  Physician Assistants and Nurse Practitioners) who all work together to provide you with the care you need, when you need it.  Your next appointment:   1 month(s)  The format for your next appointment:   In Person  Provider:   You may see Sharon Salines, DO or the following Advanced Practice Provider on your designated Care Team:    Sharon Montana, FNP   Other Instructions     Adopting a Healthy Lifestyle.  Know what a healthy weight is for you (roughly BMI <25) and aim to maintain this   Aim for 7+ servings of fruits and vegetables daily   65-80+ fluid ounces of water or unsweet tea for healthy kidneys   Limit to max 1 drink of alcohol per day; avoid smoking/tobacco   Limit animal fats in diet for cholesterol and heart health - choose grass fed whenever available   Avoid highly processed foods, and foods high in saturated/trans fats   Aim for low stress - take time to unwind and care for your mental health   Aim for 150 min of moderate intensity exercise weekly for heart health,  and weights twice weekly for bone health   Aim for 7-9 hours of sleep daily   When it comes to diets, agreement about the perfect plan isnt easy to find, even among the experts. Experts at the [employer] developed an idea known as the Healthy Eating Plate. Just imagine a plate divided into logical, healthy portions.   The emphasis is on diet quality:   Load up on vegetables and fruits - one-half of  your plate: Aim for color and variety, and remember that potatoes dont count.   Go for whole grains - one-quarter of your plate: Whole wheat, barley, wheat berries, quinoa, oats, brown rice, and foods made with them. If you want pasta, go with whole wheat pasta.   Protein power - one-quarter of your plate: Fish, chicken, beans, and nuts are all healthy, versatile protein sources. Limit red meat.   The diet, however, does go beyond the plate, offering a few other suggestions.   Use healthy plant oils, such as olive, canola, soy, corn, sunflower and peanut. Check the labels, and avoid partially hydrogenated oil, which have unhealthy trans fats.   If youre thirsty, drink water. Coffee and tea are good in moderation, but skip sugary drinks and limit milk and dairy products to one or two daily servings.   The type of carbohydrate in the diet is more important than the amount. Some sources of carbohydrates, such as vegetables, fruits, whole grains, and beans-are healthier than others.   Finally, stay active  Signed, Sharon Salines, DO  11/04/2019 10:04 PM    [place]

## 2019-11-04 NOTE — Patient Instructions (Signed)
Medication Instructions:  Your physician has recommended you make the following change in your medication:  START Losartan 25mg  (one tablet) daily  *If you need a refill on your cardiac medications before your next appointment, please call your pharmacy*  Lab Work: Your physician recommends that you return for lab work today: lipid panel  If you have labs (blood work) drawn today and your tests are completely normal, you will receive your results only by: Marland Kitchen MyChart Message (if you have MyChart) OR . A paper copy in the mail If you have any lab test that is abnormal or we need to change your treatment, we will call you to review the results.  Testing/Procedures: You had an EKG today.  Follow-Up: At [facility], you and your health needs are our priority.  As part of our continuing mission to provide you with exceptional heart care, we have created designated Provider Care Teams.  These Care Teams include your primary Cardiologist (physician) and Advanced Practice Providers (APPs -  Physician Assistants and Nurse Practitioners) who all work together to provide you with the care you need, when you need it.  Your next appointment:   1 month(s)  The format for your next appointment:   In Person  Provider:   You may see Berniece Salines, DO or the following Advanced Practice Provider on your designated Care Team:    Laurann Montana, FNP   Other Instructions

## 2019-11-05 LAB — LIPID PANEL
Chol/HDL Ratio: 2.4 ratio (ref 0.0–4.4)
Cholesterol, Total: 175 mg/dL (ref 100–199)
HDL: 73 mg/dL (ref >39)
LDL Chol Calc (NIH): 87 mg/dL (ref 0–99)
Triglycerides: 83 mg/dL (ref 0–149)
VLDL Cholesterol Cal: 15 mg/dL (ref 5–40)

## 2019-11-08 ENCOUNTER — Other Ambulatory Visit: Payer: Self-pay | Admitting: Physician Assistant

## 2019-11-08 DIAGNOSIS — M62838 Other muscle spasm: Secondary | ICD-10-CM

## 2019-11-09 ENCOUNTER — Encounter: Payer: Self-pay | Admitting: *Deleted

## 2019-11-12 ENCOUNTER — Other Ambulatory Visit: Payer: Self-pay | Admitting: Physician Assistant

## 2019-11-30 ENCOUNTER — Other Ambulatory Visit: Payer: Self-pay | Admitting: Physician Assistant

## 2019-11-30 DIAGNOSIS — M62838 Other muscle spasm: Secondary | ICD-10-CM

## 2019-12-09 ENCOUNTER — Encounter: Payer: Self-pay | Admitting: Physician Assistant

## 2019-12-09 ENCOUNTER — Ambulatory Visit (INDEPENDENT_AMBULATORY_CARE_PROVIDER_SITE_OTHER): Payer: PRIVATE HEALTH INSURANCE | Admitting: Physician Assistant

## 2019-12-09 ENCOUNTER — Other Ambulatory Visit: Payer: Self-pay

## 2019-12-09 DIAGNOSIS — T50Z95A Adverse effect of other vaccines and biological substances, initial encounter: Secondary | ICD-10-CM | POA: Diagnosis not present

## 2019-12-09 NOTE — Progress Notes (Signed)
Virtual Visit via Video   I connected with patient on 12/09/19 at  2:00 PM EST by a video enabled telemedicine application and verified that I am speaking with the correct person using two identifiers.  Location patient: Home Location provider: [facility], Office Persons participating in the virtual visit: Patient, Provider, [place] (Patina Moore)  I discussed the limitations of evaluation and management by telemedicine and the availability of in person appointments. The patient expressed understanding and agreed to proceed.  Subjective:   HPI:   Patient presents via doxy doxy.me today noting a fever today.  Patient endorses receiving the Moderna Covid vaccine yesterday at work.  States she felt fine yesterday.  Endorses waking up this morning feeling hot.  Checked her temperature and noted it to be of 102.5.  States otherwise she feels fine.  Denies headache, chills, body aches.  Denies any nausea or vomiting.  Denies redness, tenderness at injection site.  Denies rash, shortness of breath or difficulty swallowing.  Has not taken anything for symptoms.  ROS:   See pertinent positives and negatives per HPI.  Patient Active Problem List   Diagnosis Date Noted  . Diastolic dysfunction [id number]  . Obesity (BMI 30-39.9) 11/04/2019  . Obesity (BMI 30.0-34.9) 03/31/2019  . Uncontrolled type 2 diabetes mellitus with hyperglycemia ([place]) 04/15/2018  . Gastroesophageal reflux disease without esophagitis 03/11/2018  . Need for pneumococcal vaccination 03/11/2018  . Essential hypertension 06/13/2016  . Diabetes mellitus without complication ([place]) 123456  . Carpal tunnel syndrome 12/29/2015  . Lesion of ulnar nerve 12/29/2015  . Visit for preventive health examination 12/20/2015  . Breast cancer screening 12/20/2015  . Cervical radiculopathy 11/28/2015  . Primary osteoarthritis of both knees 10/10/2014  . Trigger finger, acquired 10/10/2014    Social History   Tobacco Use    . Smoking status: Former Smoker    Packs/day: 0.25    Years: 20.00    Pack years: 5.00    Types: Cigarettes    Quit date: 12/09/2009    Years since quitting: 10.0  . Smokeless tobacco: Never Used  Substance Use Topics  . Alcohol use: Yes    Alcohol/week: 0.0 standard drinks    Comment: social    Current Outpatient Medications:  .  atorvastatin (LIPITOR) 10 MG tablet, Take 1 tablet (10 mg total) by mouth daily., Disp: 90 tablet, Rfl: 1 .  azelastine (ASTELIN) 0.1 % nasal spray, Place 2 sprays into both nostrils 2 (two) times daily. Use in each nostril as directed, Disp: 30 mL, Rfl: 12 .  BAYER CONTOUR TEST test strip, USE TWICE DAILY AS INSTRUCTED TO CHECK SUGARS. DX E. 11.9, Disp: 100 each, Rfl: 5 .  BLACK COHOSH EXTRACT PO, Take by mouth., Disp: , Rfl:  .  Cinnamon 500 MG capsule, Take 1,000 mg by mouth 2 (two) times daily., Disp: , Rfl:  .  cyclobenzaprine (FLEXERIL) 10 MG tablet, TAKE 1 TABLET AT BEDTIME, Disp: 90 tablet, Rfl: 1 .  Echinacea-Goldenseal (ECHINACEA COMB/GOLDEN SEAL PO), Take by mouth., Disp: , Rfl:  .  fluocinonide cream (LIDEX) [id number] %, Apply 1 application topically 2 (two) times daily., Disp: , Rfl:  .  fluticasone (FLONASE) 50 MCG/ACT nasal spray, Place 2 sprays into both nostrils daily., Disp: 16 g, Rfl: 2 .  furosemide (LASIX) 20 MG tablet, TAKE 1 TABLET BY MOUTH EVERY DAY, Disp: 30 tablet, Rfl: 1 .  gabapentin (NEURONTIN) 300 MG capsule, Take 1 capsule (300 mg total) by mouth 3 (three) times daily., Disp: 270  capsule, Rfl: 1 .  GLUCOSAMINE PO, Take 1 tablet by mouth 2 (two) times daily., Disp: , Rfl:  .  glucose blood (CONTOUR NEXT TEST) test strip, Used to check blood sugars 2x daily. DX CODE E11.9., Disp: 100 each, Rfl: 12 .  Insulin Degludec (TRESIBA FLEXTOUCH) 200 UNIT/ML SOPN, Inject 110 Units into the skin daily., Disp: 6 pen, Rfl: 5 .  Insulin Pen Needle (BD PEN NEEDLE NANO U/F) 32G X 4 MM MISC, Used to give daily insulin injections., Disp: 100 each, Rfl:  4 .  JARDIANCE 10 MG TABS tablet, TAKE 10 MG BY MOUTH DAILY., Disp: 90 tablet, Rfl: 4 .  levocetirizine (XYZAL) 5 MG tablet, TAKE 1 TABLET BY MOUTH EVERY DAY IN THE EVENING, Disp: 90 tablet, Rfl: 1 .  losartan (COZAAR) 25 MG tablet, Take 1 tablet (25 mg total) by mouth daily., Disp: 90 tablet, Rfl: 1 .  meloxicam (MOBIC) 15 MG tablet, TAKE 1 TABLET BY MOUTH EVERY DAY, Disp: 30 tablet, Rfl: 3 .  metFORMIN (GLUCOPHAGE) 500 MG tablet, TAKE 1 TABLET (500 MG TOTAL) BY MOUTH 2 (TWO) TIMES DAILY WITH A MEAL., Disp: 180 tablet, Rfl: 3 .  MICROLET LANCETS MISC, USE TO MONITOR GLUCOSE LEVELS TWICE DAILY, Disp: 100 each, Rfl: 1 .  Misc Natural Products (HERBAL ENERGY COMPLEX PO), Take by mouth 2 (two) times daily with a meal., Disp: , Rfl:  .  Multiple Vitamins-Minerals (WOMENS MULTIVITAMIN PO), Take by mouth daily., Disp: , Rfl:  .  naproxen (NAPROSYN) 500 MG tablet, Take 1 tablet (500 mg total) by mouth 2 (two) times daily with a meal., Disp: 20 tablet, Rfl: 0 .  Omega-3 1000 MG CAPS, Take 1 g by mouth daily., Disp: , Rfl:  .  omeprazole (PRILOSEC) 20 MG capsule, Take 1 capsule (20 mg total) by mouth daily., Disp: 90 capsule, Rfl: 1 .  ONGLYZA 5 MG TABS tablet, TAKE 1 TABLET BY MOUTH EVERY DAY, Disp: 90 tablet, Rfl: 3 .  promethazine-dextromethorphan (PROMETHAZINE-DM) 6.25-15 MG/5ML syrup, Take 5 mLs by mouth 4 (four) times daily as needed for cough., Disp: 118 mL, Rfl: 0 .  vitamin B-12 (CYANOCOBALAMIN) 100 MCG tablet, Take 100 mcg by mouth daily., Disp: , Rfl:  .  vitamin C (ASCORBIC ACID) 500 MG tablet, Take 1,000 mg by mouth daily., Disp: , Rfl:  .  vitamin E 400 UNIT capsule, Take 400 Units by mouth daily. Reported on 02/13/2016, Disp: , Rfl:   Allergies  Allergen Reactions  . Penicillins Hives    Hives   . Tramadol Itching    Objective:   There were no vitals taken for this visit.  No labored breathing.  Speech is clear and coherent with logical content.  Patient is alert and oriented at  baseline.   Assessment and Plan:   1. Adverse effect of vaccine, initial encounter No true symptoms or signs noted for allergic reaction to Covid vaccine.  Does have fever starting today.  Otherwise asymptomatic.  Discussed this is likely an immune response to receiving the vaccine and tells [place] that her immune system is intact.  Recommend if fever is greater than 102 or has mild headache with it, take Tylenol.  Otherwise we will watch over the next 24 hours as this should calm down.  She is to notify us ASAP for any new onset symptoms.  Strict ER precautions reviewed with patient.  Made note to call her tomorrow morning to check in on her.   Leeanne Rio, PA-C 12/09/2019

## 2019-12-09 NOTE — Progress Notes (Signed)
I have discussed the procedure for the virtual visit with the patient who has given consent to proceed with assessment and treatment.   Jawanda Passey S Kysean Sweet, CMA     

## 2019-12-13 ENCOUNTER — Other Ambulatory Visit: Payer: Self-pay | Admitting: Physician Assistant

## 2019-12-14 ENCOUNTER — Ambulatory Visit: Payer: PRIVATE HEALTH INSURANCE | Admitting: Cardiology

## 2019-12-15 ENCOUNTER — Ambulatory Visit (INDEPENDENT_AMBULATORY_CARE_PROVIDER_SITE_OTHER): Payer: PRIVATE HEALTH INSURANCE | Admitting: Cardiology

## 2019-12-15 ENCOUNTER — Encounter: Payer: Self-pay | Admitting: Cardiology

## 2019-12-15 ENCOUNTER — Other Ambulatory Visit: Payer: Self-pay

## 2019-12-15 VITALS — BP 106/74 | HR 92 | Ht 66.0 in | Wt 213.0 lb

## 2019-12-15 DIAGNOSIS — R079 Chest pain, unspecified: Secondary | ICD-10-CM | POA: Diagnosis not present

## 2019-12-15 DIAGNOSIS — I10 Essential (primary) hypertension: Secondary | ICD-10-CM | POA: Diagnosis not present

## 2019-12-15 DIAGNOSIS — Z01812 Encounter for preprocedural laboratory examination: Secondary | ICD-10-CM | POA: Diagnosis not present

## 2019-12-15 DIAGNOSIS — E782 Mixed hyperlipidemia: Secondary | ICD-10-CM

## 2019-12-15 MED ORDER — METOPROLOL TARTRATE 50 MG PO TABS: ORAL_TABLET | ORAL | 0 refills | Status: DC

## 2019-12-15 MED ORDER — FUROSEMIDE 20 MG PO TABS: 20.0000 mg | ORAL_TABLET | Freq: Every day | ORAL | 1 refills | Status: DC

## 2019-12-15 NOTE — Progress Notes (Signed)
Cardiology Office Note:    Date:  12/15/2019   ID:  TAI Wallace, DOB 1961/02/25, MRN [id number]  PCP:  Brunetta Jeans, PA-C  Cardiologist:  Berniece Salines, DO  Electrophysiologist:  None   Referring MD: Brunetta Jeans, PA-C   Follow up visit  History of Present Illness:   SAKIYAH Wallace is a 59 y.o. female with a hx of hypertension , DM and hyperlipidemia and recently covered from COVID-35. The was seen on November 04, 2019 due to an abnormal echocardiogram which showed moderate LVH and grade 1 diastolic dysfunction.  Due to elevated systolic blood pressure I started the patient on losartan 25 mg daily to her already taking Lasix 20 mg daily.  I requested repeat lipid profile.    She is here today for follow-up visit.  Her blood pressure has been improving however she reports throbbing chest pain.  Tells me that she noticed it recently that has been intermittent last a few seconds prior to resolution.  Denies any associated shortness of breath, lightheadedness dizziness.   Past Medical History:  Diagnosis Date  . Anemia   . Arthritis    bilateral knees  . Bronchitis    hx of  . Fibroid tumor    Had partial hysterectomy  . Gallstones   . Migraine    hx of    Past Surgical History:  Procedure Laterality Date  . ABDOMINAL HYSTERECTOMY     partial   . boil removed     . CARPAL TUNNEL RELEASE  04-15-15  . CHOLECYSTECTOMY  Oct 2012  . ELBOW SURGERY  04-15-15  . HAND SURGERY  04-15-15  . Nerve damage  04-15-15  . TOTAL KNEE ARTHROPLASTY  08/01/2012   Procedure: TOTAL KNEE ARTHROPLASTY;  Surgeon: Augustin Schooling, MD;  Location: [facility];  Service: Orthopedics;  Laterality: Right;  RIGHT TOTAL KNEE ARTHROPLASTY  . TRIGGER FINGER RELEASE  04-15-15  . TUBAL LIGATION    . WISDOM TOOTH EXTRACTION      Current Medications: Current Meds  Medication Sig  . atorvastatin (LIPITOR) 10 MG tablet Take 1 tablet (10 mg total) by mouth daily.  Marland Kitchen azelastine (ASTELIN) 0.1 % nasal spray Place 2  sprays into both nostrils 2 (two) times daily. Use in each nostril as directed  . BAYER CONTOUR TEST test strip USE TWICE DAILY AS INSTRUCTED TO CHECK SUGARS. DX E. 11.9  . BLACK COHOSH EXTRACT PO Take by mouth.  . Cinnamon 500 MG capsule Take 1,000 mg by mouth 2 (two) times daily.  . cyclobenzaprine (FLEXERIL) 10 MG tablet TAKE 1 TABLET AT BEDTIME  . Echinacea-Goldenseal (ECHINACEA COMB/GOLDEN SEAL PO) Take by mouth.  . fluocinonide cream (LIDEX) [id number] % Apply 1 application topically 2 (two) times daily.  . fluticasone (FLONASE) 50 MCG/ACT nasal spray Place 2 sprays into both nostrils daily.  . furosemide (LASIX) 20 MG tablet Take 1 tablet (20 mg total) by mouth daily.  Marland Kitchen gabapentin (NEURONTIN) 300 MG capsule Take 1 capsule (300 mg total) by mouth 3 (three) times daily.  Marland Kitchen GLUCOSAMINE PO Take 1 tablet by mouth 2 (two) times daily.  Marland Kitchen glucose blood (CONTOUR NEXT TEST) test strip Used to check blood sugars 2x daily. DX CODE E11.9.  . Insulin Degludec (TRESIBA FLEXTOUCH) 200 UNIT/ML SOPN Inject 110 Units into the skin daily.  . Insulin Pen Needle (BD PEN NEEDLE NANO U/F) 32G X 4 MM MISC Used to give daily insulin injections.  Marland Kitchen JARDIANCE 10 MG TABS tablet TAKE  10 MG BY MOUTH DAILY.  Marland Kitchen levocetirizine (XYZAL) 5 MG tablet TAKE 1 TABLET BY MOUTH EVERY DAY IN THE EVENING  . losartan (COZAAR) 25 MG tablet Take 1 tablet (25 mg total) by mouth daily.  . meloxicam (MOBIC) 15 MG tablet TAKE 1 TABLET BY MOUTH EVERY DAY  . metFORMIN (GLUCOPHAGE) 500 MG tablet TAKE 1 TABLET (500 MG TOTAL) BY MOUTH 2 (TWO) TIMES DAILY WITH A MEAL.  Marland Kitchen MICROLET LANCETS MISC USE TO MONITOR GLUCOSE LEVELS TWICE DAILY  . Misc Natural Products (HERBAL ENERGY COMPLEX PO) Take by mouth 2 (two) times daily with a meal.  . Multiple Vitamins-Minerals (WOMENS MULTIVITAMIN PO) Take by mouth daily.  . naproxen (NAPROSYN) 500 MG tablet Take 1 tablet (500 mg total) by mouth 2 (two) times daily with a meal.  . Omega-3 1000 MG CAPS Take 1 g by  mouth daily.  Marland Kitchen omeprazole (PRILOSEC) 20 MG capsule Take 1 capsule (20 mg total) by mouth daily.  . ONGLYZA 5 MG TABS tablet TAKE 1 TABLET BY MOUTH EVERY DAY  . promethazine-dextromethorphan (PROMETHAZINE-DM) 6.25-15 MG/5ML syrup Take 5 mLs by mouth 4 (four) times daily as needed for cough.  . vitamin B-12 (CYANOCOBALAMIN) 100 MCG tablet Take 100 mcg by mouth daily.  . vitamin C (ASCORBIC ACID) 500 MG tablet Take 1,000 mg by mouth daily.  . vitamin E 400 UNIT capsule Take 400 Units by mouth daily. Reported on 02/13/2016  . [DISCONTINUED] furosemide (LASIX) 20 MG tablet TAKE 1 TABLET BY MOUTH EVERY DAY     Allergies:   Penicillins and Tramadol   Social History   Socioeconomic History  . Marital status: Single    Spouse name: Not on file  . Number of children: Not on file  . Years of education: Not on file  . Highest education level: Not on file  Occupational History  . Not on file  Tobacco Use  . Smoking status: Former Smoker    Packs/day: 0.25    Years: 20.00    Pack years: 5.00    Types: Cigarettes    Quit date: 12/09/2009    Years since quitting: 10.0  . Smokeless tobacco: Never Used  Substance and Sexual Activity  . Alcohol use: Yes    Alcohol/week: 0.0 standard drinks    Comment: social  . Drug use: No  . Sexual activity: Not Currently  Other Topics Concern  . Not on file  Social History Narrative  . Not on file   Social Determinants of Health   Financial Resource Strain:   . Difficulty of Paying Living Expenses: Not on file  Food Insecurity:   . Worried About Charity fundraiser in the Last Year: Not on file  . Ran Out of Food in the Last Year: Not on file  Transportation Needs:   . Lack of Transportation (Medical): Not on file  . Lack of Transportation (Non-Medical): Not on file  Physical Activity:   . Days of Exercise per Week: Not on file  . Minutes of Exercise per Session: Not on file  Stress:   . Feeling of Stress : Not on file  Social Connections:     . Frequency of Communication with Friends and Family: Not on file  . Frequency of Social Gatherings with Friends and Family: Not on file  . Attends Religious Services: Not on file  . Active Member of Clubs or Organizations: Not on file  . Attends Archivist Meetings: Not on file  . Marital Status:  Not on file     Family History: The patient's family history includes Arthritis in an other family member; Asthma in her mother; Breast cancer (age of onset: 47) in her cousin; Colon cancer in her maternal uncle; Diabetes in her brother, mother, and sister; Diabetes (age of onset: 55) in her father; Healthy in her son; Heart disease in her maternal aunt and maternal uncle; Hyperlipidemia in her mother; Kidney failure (age of onset: 27) in her brother; Tuberculosis in her mother. There is no history of Esophageal cancer, Rectal cancer, or Stomach cancer.  ROS:   Review of Systems  Constitution: Negative for decreased appetite, fever and weight gain.  HENT: Negative for congestion, ear discharge, hoarse voice and sore throat.   Eyes: Negative for discharge, redness, vision loss in right eye and visual halos.  Cardiovascular: Reports chest pain.  Negative for dyspnea on exertion, leg swelling, orthopnea and palpitations.  Respiratory: Negative for cough, hemoptysis, shortness of breath and snoring.   Endocrine: Negative for heat intolerance and polyphagia.  Hematologic/Lymphatic: Negative for bleeding problem. Does not bruise/bleed easily.  Skin: Negative for flushing, nail changes, rash and suspicious lesions.  Musculoskeletal: Negative for arthritis, joint pain, muscle cramps, myalgias, neck pain and stiffness.  Gastrointestinal: Negative for abdominal pain, bowel incontinence, diarrhea and excessive appetite.  Genitourinary: Negative for decreased libido, genital sores and incomplete emptying.  Neurological: Negative for brief paralysis, focal weakness, headaches and loss of balance.   Psychiatric/Behavioral: Negative for altered mental status, depression and suicidal ideas.  Allergic/Immunologic: Negative for HIV exposure and persistent infections.    EKGs/Labs/Other Studies Reviewed:    The following studies were reviewed today:   EKG:  The ekg ordered today demonstrates   Recent Labs: 10/09/2019: BUN 10; Creatinine, Ser 0.77; Potassium 3.6; Sodium 142; TSH 1.50  Recent Lipid Panel    Component Value Date/Time   CHOL 175 11/04/2019 1128   TRIG 83 11/04/2019 1128   HDL 73 11/04/2019 1128   CHOLHDL 2.4 11/04/2019 1128   CHOLHDL 2 03/11/2018 0920   VLDL 12.2 03/11/2018 0920   LDLCALC 87 11/04/2019 1128    Physical Exam:    VS:  BP 106/74 (BP Location: Left Arm, Patient Position: Sitting, Cuff Size: Normal)   Pulse 92   Ht 5\' 6"  (1.676 m)   Wt 213 lb (96.6 kg)   SpO2 95%   BMI 34.38 kg/m     Wt Readings from Last 3 Encounters:  12/15/19 213 lb (96.6 kg)  11/04/19 207 lb (93.9 kg)  10/21/19 214 lb (97.1 kg)     GEN: Well nourished, well developed in no acute distress HEENT: Normal NECK: No JVD; No carotid bruits LYMPHATICS: No lymphadenopathy CARDIAC: S1S2 noted,RRR, no murmurs, rubs, gallops RESPIRATORY:  Clear to auscultation without rales, wheezing or rhonchi  ABDOMEN: Soft, non-tender, non-distended, +bowel sounds, no guarding. EXTREMITIES: No edema, No cyanosis, no clubbing MUSCULOSKELETAL:  No edema; No deformity  SKIN: Warm and dry NEUROLOGIC:  Alert and oriented x 3, non-focal PSYCHIATRIC:  Normal affect, good insight  ASSESSMENT:    1. Chest pain, unspecified type   2. Essential hypertension   3. Mixed hyperlipidemia   4. Pre-procedure lab exam    PLAN:    1.  Chest pain-given her significant risk factors I am going to pursue an ischemic evaluation in this patient.  She denies any IV contrast dye allergy therefore I am going to order a coronary CTA.   2.  Her blood pressure has improved she will continue  on her losartan 25 mg  daily as well as Lasix 20 mg daily.  3.  Hyperlipidemia, upper profile performed was appropriate so therefore we will keep patient for now on Lipitor 10 mg daily.  The patient is in agreement with the above plan. The patient left the office in stable condition.  The patient will follow up in 6 months or sooner if needed   Medication Adjustments/Labs and Tests Ordered: Current medicines are reviewed at length with the patient today.  Concerns regarding medicines are outlined above.  Orders Placed This Encounter  Procedures  . CT CORONARY FRACTIONAL FLOW RESERVE DATA PREP  . CT CORONARY FRACTIONAL FLOW RESERVE FLUID ANALYSIS  . CT CORONARY MORPH W/CTA COR W/SCORE W/CA W/CM &/OR WO/CM  . Basic Metabolic Panel (BMET)  . Magnesium  . Basic Metabolic Panel (BMET)   Meds ordered this encounter  Medications  . metoprolol tartrate (LOPRESSOR) 50 MG tablet    Sig: Take 2 tabs (100 mg) once 2 hours prior to CT if heart rate greater than 55    Dispense:  2 tablet    Refill:  0  . furosemide (LASIX) 20 MG tablet    Sig: Take 1 tablet (20 mg total) by mouth daily.    Dispense:  90 tablet    Refill:  1    There are no Patient Instructions on file for this visit.   Adopting a Healthy Lifestyle.  Know what a healthy weight is for you (roughly BMI <25) and aim to maintain this   Aim for 7+ servings of fruits and vegetables daily   65-80+ fluid ounces of water or unsweet tea for healthy kidneys   Limit to max 1 drink of alcohol per day; avoid smoking/tobacco   Limit animal fats in diet for cholesterol and heart health - choose grass fed whenever available   Avoid highly processed foods, and foods high in saturated/trans fats   Aim for low stress - take time to unwind and care for your mental health   Aim for 150 min of moderate intensity exercise weekly for heart health, and weights twice weekly for bone health   Aim for 7-9 hours of sleep daily   When it comes to diets, agreement  about the perfect plan isnt easy to find, even among the experts. Experts at the [employer] developed an idea known as the Healthy Eating Plate. Just imagine a plate divided into logical, healthy portions.   The emphasis is on diet quality:   Load up on vegetables and fruits - one-half of your plate: Aim for color and variety, and remember that potatoes dont count.   Go for whole grains - one-quarter of your plate: Whole wheat, barley, wheat berries, quinoa, oats, brown rice, and foods made with them. If you want pasta, go with whole wheat pasta.   Protein power - one-quarter of your plate: Fish, chicken, beans, and nuts are all healthy, versatile protein sources. Limit red meat.   The diet, however, does go beyond the plate, offering a few other suggestions.   Use healthy plant oils, such as olive, canola, soy, corn, sunflower and peanut. Check the labels, and avoid partially hydrogenated oil, which have unhealthy trans fats.   If youre thirsty, drink water. Coffee and tea are good in moderation, but skip sugary drinks and limit milk and dairy products to one or two daily servings.   The type of carbohydrate in the diet is more important than the amount.  Some sources of carbohydrates, such as vegetables, fruits, whole grains, and beans-are healthier than others.   Finally, stay active  Signed, Berniece Salines, DO  12/15/2019 11:32 AM    [place]

## 2019-12-15 NOTE — Patient Instructions (Signed)
Medication Instructions:  Your physician recommends that you continue on your current medications as directed. Please refer to the Current Medication list given to you today.  *If you need a refill on your cardiac medications before your next appointment, please call your pharmacy*  Lab Work: Your physician recommends that you return for lab work in: TODAY BMP,Magnesium   3-7 days prior to CT:BMP  If you have labs (blood work) drawn today and your tests are completely normal, you will receive your results only by: Marland Kitchen MyChart Message (if you have MyChart) OR . A paper copy in the mail If you have any lab test that is abnormal or we need to change your treatment, we will call you to review the results.  Testing/Procedures: Your physician has requested that you have cardiac CT. Cardiac computed tomography (CT) is a painless test that uses an x-ray machine to take clear, detailed pictures of your heart. For further information please visit [email or web address]. Please follow instruction sheet as given.  Your cardiac CT will be scheduled at one of the below locations:   [facility] [address] [place], [place] [address] [phone]  If scheduled at [facility], please arrive at the [facility] main entrance of [facility] 30-45 minutes prior to test start time. Proceed to the [facility] Radiology Department (first floor) to check-in and test prep.   Please follow these instructions carefully (unless otherwise directed):  On the Night Before the Test: . Be sure to Drink plenty of water. . Do not consume any caffeinated/decaffeinated beverages or chocolate 12 hours prior to your test. . Do not take any antihistamines 12 hours prior to your test.  On the Day of the Test: . Drink plenty of water. Do not drink any water within one hour of the test. . Do not eat any food 4 hours prior to the test. . You may take your regular medications prior to the test.   . Take metoprolol (Lopressor) two hours prior to test. . HOLD Furosemide morning of the test. . FEMALES- please wear underwire-free bra if available                 -If HR is less than 55 BPM- No Beta Blocker(metoprolol)                -IF HR is greater than 55 BPM and patient is less than or equal to 16 yrs old Lopressor(metoprolol) 100mg  x1.         After the Test: . Drink plenty of water. . After receiving IV contrast, you may experience a mild flushed feeling. This is normal. . On occasion, you may experience a mild rash up to 24 hours after the test. This is not dangerous. If this occurs, you can take Benadryl 25 mg and increase your fluid intake. . If you experience trouble breathing, this can be serious. If it is severe call 911 IMMEDIATELY. If it is mild, please call our office. . If you take any of these medications:/Metformin,please do not take 48 hours after completing test unless otherwise instructed.   Once we have confirmed authorization from your insurance company, we will call you to set up a date and time for your test.   For non-scheduling related questions, please contact the cardiac imaging nurse navigator should you have any questions/concerns: Marchia Bond, RN Navigator Cardiac Imaging [facility] [phone] Office    Follow-Up: At [facility], you and your  health needs are our priority.  As part of our continuing mission to provide you with exceptional heart care, we have created designated Provider Care Teams.  These Care Teams include your primary Cardiologist (physician) and Advanced Practice Providers (APPs -  Physician Assistants and Nurse Practitioners) who all work together to provide you with the care you need, when you need it.  Your next appointment:   6 month(s)  The format for your next appointment:   In Person  Provider:   Berniece Salines, DO  Other Instructions  Cardiac CT Angiogram A cardiac CT angiogram is a  procedure to look at the heart and the area around the heart. It may be done to help find the cause of chest pains or other symptoms of heart disease. During this procedure, a substance called contrast dye is injected into the blood vessels in the area to be checked. A large X-ray machine, called a CT scanner, then takes detailed pictures of the heart and the surrounding area. The procedure is also sometimes called a coronary CT angiogram, coronary artery scanning, or CTA. A cardiac CT angiogram allows the health care provider to see how well blood is flowing to and from the heart. The health care provider will be able to see if there are any problems, such as:  Blockage or narrowing of the coronary arteries in the heart.  Fluid around the heart.  Signs of weakness or disease in the muscles, valves, and tissues of the heart. Tell a health care provider about:  Any allergies you have. This is especially important if you have had a previous allergic reaction to contrast dye.  All medicines you are taking, including vitamins, herbs, eye drops, creams, and over-the-counter medicines.  Any blood disorders you have.  Any surgeries you have had.  Any medical conditions you have.  Whether you are pregnant or may be pregnant.  Any anxiety disorders, chronic pain, or other conditions you have that may increase your stress or prevent you from lying still. What are the risks? Generally, this is a safe procedure. However, problems may occur, including:  Bleeding.  Infection.  Allergic reactions to medicines or dyes.  Damage to other structures or organs.  Kidney damage from the contrast dye that is used.  Increased risk of cancer from radiation exposure. This risk is low. Talk with your health care provider about: ? The risks and benefits of testing. ? How you can receive the lowest dose of radiation. What happens before the procedure?  Wear comfortable clothing and remove any jewelry,  glasses, dentures, and hearing aids.  Follow instructions from your health care provider about eating and drinking. This may include: ? For 12 hours before the procedure -- avoid caffeine. This includes tea, coffee, soda, energy drinks, and diet pills. Drink plenty of water or other fluids that do not have caffeine in them. Being well hydrated can prevent complications. ? For 4-6 hours before the procedure -- stop eating and drinking. The contrast dye can cause nausea, but this is less likely if your stomach is empty.  Ask your health care provider about changing or stopping your regular medicines. This is especially important if you are taking diabetes medicines, blood thinners, or medicines to treat problems with erections (erectile dysfunction). What happens during the procedure?   Hair on your chest may need to be removed so that small sticky patches called electrodes can be placed on your chest. These will transmit information that helps to monitor your heart during  the procedure.  An IV will be inserted into one of your veins.  You might be given a medicine to control your heart rate during the procedure. This will help to ensure that good images are obtained.  You will be asked to lie on an exam table. This table will slide in and out of the CT machine during the procedure.  Contrast dye will be injected into the IV. You might feel warm, or you may get a metallic taste in your mouth.  You will be given a medicine called nitroglycerin. This will relax or dilate the arteries in your heart.  The table that you are lying on will move into the CT machine tunnel for the scan.  The person running the machine will give you instructions while the scans are being done. You may be asked to: ? Keep your arms above your head. ? Hold your breath. ? Stay very still, even if the table is moving.  When the scanning is complete, you will be moved out of the machine.  The IV will be removed. The  procedure may vary among health care providers and hospitals. What can I expect after the procedure? After your procedure, it is common to have:  A metallic taste in your mouth from the contrast dye.  A feeling of warmth.  A headache from the nitroglycerin. Follow these instructions at home:  Take over-the-counter and prescription medicines only as told by your health care provider.  If you are told, drink enough fluid to keep your urine pale yellow. This will help to flush the contrast dye out of your body.  Most people can return to their normal activities right after the procedure. Ask your health care provider what activities are safe for you.  It is up to you to get the results of your procedure. Ask your health care provider, or the department that is doing the procedure, when your results will be ready.  Keep all follow-up visits as told by your health care provider. This is important. Contact a health care provider if:  You have any symptoms of allergy to the contrast dye. These include: ? Shortness of breath. ? Rash or hives. ? A racing heartbeat. Summary  A cardiac CT angiogram is a procedure to look at the heart and the area around the heart. It may be done to help find the cause of chest pains or other symptoms of heart disease.  During this procedure, a large X-ray machine, called a CT scanner, takes detailed pictures of the heart and the surrounding area after a contrast dye has been injected into blood vessels in the area.  Ask your health care provider about changing or stopping your regular medicines before the procedure. This is especially important if you are taking diabetes medicines, blood thinners, or medicines to treat erectile dysfunction.  If you are told, drink enough fluid to keep your urine pale yellow. This will help to flush the contrast dye out of your body. This information is not intended to replace advice given to you by your health care provider.  Make sure you discuss any questions you have with your health care provider. Document Revised: 07/22/2019 Document Reviewed: 07/22/2019 Elsevier Patient Education  [employer].

## 2019-12-16 LAB — MAGNESIUM: Magnesium: 2.1 mg/dL (ref 1.6–2.3)

## 2019-12-16 LAB — BASIC METABOLIC PANEL
BUN/Creatinine Ratio: 19 (ref 12–28)
BUN: 16 mg/dL (ref 8–27)
CO2: 25 mmol/L (ref 20–29)
Calcium: 9.9 mg/dL (ref 8.7–10.3)
Chloride: 103 mmol/L (ref 96–106)
Creatinine, Ser: 0.83 mg/dL (ref 0.57–1.00)
GFR calc Af Amer: 89 mL/min/{1.73_m2} (ref >59)
GFR calc non Af Amer: 77 mL/min/{1.73_m2} (ref >59)
Glucose: 48 mg/dL — ABNORMAL LOW (ref 65–99)
Potassium: 4.1 mmol/L (ref 3.5–5.2)
Sodium: 141 mmol/L (ref 134–144)

## 2019-12-28 ENCOUNTER — Telehealth: Payer: Self-pay | Admitting: *Deleted

## 2019-12-28 NOTE — Telephone Encounter (Signed)
Error

## 2019-12-29 ENCOUNTER — Other Ambulatory Visit: Payer: Self-pay | Admitting: Physician Assistant

## 2019-12-29 MED ORDER — LEVOCETIRIZINE DIHYDROCHLORIDE 5 MG PO TABS: 5.0000 mg | ORAL_TABLET | Freq: Every evening | ORAL | 1 refills | Status: DC

## 2020-01-13 ENCOUNTER — Other Ambulatory Visit: Payer: Self-pay

## 2020-01-15 ENCOUNTER — Ambulatory Visit (INDEPENDENT_AMBULATORY_CARE_PROVIDER_SITE_OTHER): Payer: PRIVATE HEALTH INSURANCE | Admitting: Endocrinology

## 2020-01-15 ENCOUNTER — Other Ambulatory Visit: Payer: Self-pay | Admitting: Physician Assistant

## 2020-01-15 ENCOUNTER — Other Ambulatory Visit: Payer: Self-pay

## 2020-01-15 ENCOUNTER — Encounter: Payer: Self-pay | Admitting: Endocrinology

## 2020-01-15 VITALS — BP 100/60 | HR 107 | Ht 66.0 in | Wt 212.8 lb

## 2020-01-15 DIAGNOSIS — E785 Hyperlipidemia, unspecified: Secondary | ICD-10-CM

## 2020-01-15 DIAGNOSIS — E1165 Type 2 diabetes mellitus with hyperglycemia: Secondary | ICD-10-CM | POA: Diagnosis not present

## 2020-01-15 LAB — POCT GLYCOSYLATED HEMOGLOBIN (HGB A1C): Hemoglobin A1C: 7.1 % — AB (ref 4.0–5.6)

## 2020-01-15 MED ORDER — JARDIANCE 25 MG PO TABS: 25.0000 mg | ORAL_TABLET | Freq: Every day | ORAL | 3 refills | Status: DC

## 2020-01-15 MED ORDER — TRESIBA FLEXTOUCH 200 UNIT/ML ~~LOC~~ SOPN: 100.0000 [IU] | PEN_INJECTOR | Freq: Every day | SUBCUTANEOUS | 5 refills | Status: DC

## 2020-01-15 NOTE — Patient Instructions (Addendum)
Please increase the [place] and reduce the [place]. Please continue the same other diabetes medications.   check your blood sugar twice a day.  vary the time of day when you check, between before the 3 meals, and at bedtime.  also check if you have symptoms of your blood sugar being too high or too low.  please keep a record of the readings and bring it to your next appointment here (or you can bring the meter itself).  You can write it on any piece of paper.  please call us sooner if your blood sugar goes below 70, or if you have a lot of readings over 200.  Please come back for a follow-up appointment in 3 months.

## 2020-01-15 NOTE — Progress Notes (Signed)
Subjective:    Patient ID: [name], female    DOB: 01-04-1961, 59 y.o.   MRN: [id number]  HPI Pt returns for f/u of diabetes mellitus:  DM type: Insulin-requiring type 2.  Dx'ed: 2017, when she had a cbg of 400 on steroids.  Complications: none.  Therapy: insulin and 3 oral meds.   GDM: never.   DKA: never.  Severe hypoglycemia: never.  Pancreatitis: never.    SDOH: she is on qd insulin, due to noncompliance.   Interval history: No recent steroids.  Pt says cbg's are in the 100's.  pt states she feels well in general.  pt states she feels well in general. Past Medical History:  Diagnosis Date  . Anemia   . Arthritis    bilateral knees  . Bronchitis    hx of  . Fibroid tumor    Had partial hysterectomy  . Gallstones   . Migraine    hx of    Past Surgical History:  Procedure Laterality Date  . ABDOMINAL HYSTERECTOMY     partial   . boil removed     . CARPAL TUNNEL RELEASE  04-15-15  . CHOLECYSTECTOMY  Oct 2012  . ELBOW SURGERY  04-15-15  . HAND SURGERY  04-15-15  . Nerve damage  04-15-15  . TOTAL KNEE ARTHROPLASTY  08/01/2012   Procedure: TOTAL KNEE ARTHROPLASTY;  Surgeon: Augustin Schooling, MD;  Location: [facility];  Service: Orthopedics;  Laterality: Right;  RIGHT TOTAL KNEE ARTHROPLASTY  . TRIGGER FINGER RELEASE  04-15-15  . TUBAL LIGATION    . WISDOM TOOTH EXTRACTION      Social History   Socioeconomic History  . Marital status: Single    Spouse name: Not on file  . Number of children: Not on file  . Years of education: Not on file  . Highest education level: Not on file  Occupational History  . Not on file  Tobacco Use  . Smoking status: Former Smoker    Packs/day: 0.25    Years: 20.00    Pack years: 5.00    Types: Cigarettes    Quit date: 12/09/2009    Years since quitting: 10.1  . Smokeless tobacco: Never Used  Substance and Sexual Activity  . Alcohol use: Yes    Alcohol/week: 0.0 standard drinks    Comment: social  . Drug use: No  . Sexual  activity: Not Currently  Other Topics Concern  . Not on file  Social History Narrative  . Not on file   Social Determinants of Health   Financial Resource Strain:   . Difficulty of Paying Living Expenses: Not on file  Food Insecurity:   . Worried About Charity fundraiser in the Last Year: Not on file  . Ran Out of Food in the Last Year: Not on file  Transportation Needs:   . Lack of Transportation (Medical): Not on file  . Lack of Transportation (Non-Medical): Not on file  Physical Activity:   . Days of Exercise per Week: Not on file  . Minutes of Exercise per Session: Not on file  Stress:   . Feeling of Stress : Not on file  Social Connections:   . Frequency of Communication with Friends and Family: Not on file  . Frequency of Social Gatherings with Friends and Family: Not on file  . Attends Religious Services: Not on file  . Active Member of Clubs or Organizations: Not on file  . Attends Archivist Meetings: Not  on file  . Marital Status: Not on file  Intimate Partner Violence:   . Fear of Current or Ex-Partner: Not on file  . Emotionally Abused: Not on file  . Physically Abused: Not on file  . Sexually Abused: Not on file    Current Outpatient Medications on File Prior to Visit  Medication Sig Dispense Refill  . azelastine (ASTELIN) 0.1 % nasal spray Place 2 sprays into both nostrils 2 (two) times daily. Use in each nostril as directed 30 mL 12  . BAYER CONTOUR TEST test strip USE TWICE DAILY AS INSTRUCTED TO CHECK SUGARS. DX E. 11.9 100 each 5  . BLACK COHOSH EXTRACT PO Take by mouth.    . Cinnamon 500 MG capsule Take 1,000 mg by mouth 2 (two) times daily.    . cyclobenzaprine (FLEXERIL) 10 MG tablet TAKE 1 TABLET AT BEDTIME 90 tablet 1  . Echinacea-Goldenseal (ECHINACEA COMB/GOLDEN SEAL PO) Take by mouth.    . fluocinonide cream (LIDEX) [id number] % Apply 1 application topically 2 (two) times daily.    . fluticasone (FLONASE) 50 MCG/ACT nasal spray Place 2 sprays  into both nostrils daily. 16 g 2  . furosemide (LASIX) 20 MG tablet Take 1 tablet (20 mg total) by mouth daily. 90 tablet 1  . gabapentin (NEURONTIN) 300 MG capsule Take 1 capsule (300 mg total) by mouth 3 (three) times daily. 270 capsule 1  . GLUCOSAMINE PO Take 1 tablet by mouth 2 (two) times daily.    Marland Kitchen glucose blood (CONTOUR NEXT TEST) test strip Used to check blood sugars 2x daily. DX CODE E11.9. 100 each 12  . Insulin Pen Needle (BD PEN NEEDLE NANO U/F) 32G X 4 MM MISC Used to give daily insulin injections. 100 each 4  . levocetirizine (XYZAL) 5 MG tablet Take 1 tablet (5 mg total) by mouth every evening. 90 tablet 1  . losartan (COZAAR) 25 MG tablet Take 1 tablet (25 mg total) by mouth daily. 90 tablet 1  . meloxicam (MOBIC) 15 MG tablet TAKE 1 TABLET BY MOUTH EVERY DAY 30 tablet 3  . metFORMIN (GLUCOPHAGE) 500 MG tablet TAKE 1 TABLET (500 MG TOTAL) BY MOUTH 2 (TWO) TIMES DAILY WITH A MEAL. 180 tablet 3  . metoprolol tartrate (LOPRESSOR) 50 MG tablet Take 2 tabs (100 mg) once 2 hours prior to CT if heart rate greater than 55 2 tablet 0  . MICROLET LANCETS MISC USE TO MONITOR GLUCOSE LEVELS TWICE DAILY 100 each 1  . Misc Natural Products (HERBAL ENERGY COMPLEX PO) Take by mouth 2 (two) times daily with a meal.    . Multiple Vitamins-Minerals (WOMENS MULTIVITAMIN PO) Take by mouth daily.    . naproxen (NAPROSYN) 500 MG tablet Take 1 tablet (500 mg total) by mouth 2 (two) times daily with a meal. 20 tablet 0  . Omega-3 1000 MG CAPS Take 1 g by mouth daily.    Marland Kitchen omeprazole (PRILOSEC) 20 MG capsule Take 1 capsule (20 mg total) by mouth daily. 90 capsule 1  . ONGLYZA 5 MG TABS tablet TAKE 1 TABLET BY MOUTH EVERY DAY 90 tablet 3  . vitamin B-12 (CYANOCOBALAMIN) 100 MCG tablet Take 100 mcg by mouth daily.    . vitamin C (ASCORBIC ACID) 500 MG tablet Take 1,000 mg by mouth daily.    . vitamin E 400 UNIT capsule Take 400 Units by mouth daily. Reported on 02/13/2016     No current  facility-administered medications on file prior to visit.  Allergies  Allergen Reactions  . Penicillins Hives    Hives   . Tramadol Itching    Family History  Problem Relation Age of Onset  . Diabetes Father 67       Deceased  . Diabetes Mother        Living  . Hyperlipidemia Mother   . Tuberculosis Mother   . Asthma Mother   . Heart disease Maternal Uncle   . Colon cancer Maternal Uncle        dx in 17's  . Diabetes Brother   . Heart disease Maternal Aunt   . Arthritis Other        Maternal Aunts & Uncles  . Diabetes Sister   . Kidney failure Brother 30       Diseased  . Healthy Son        x1  . Breast cancer Cousin 22  . Esophageal cancer Neg Hx   . Rectal cancer Neg Hx   . Stomach cancer Neg Hx     BP 100/60 (BP Location: Left Arm, Patient Position: Sitting, Cuff Size: Normal)   Pulse (!) 107   Ht 5\' 6"  (1.676 m)   Wt 212 lb 12.8 oz (96.5 kg)   SpO2 97%   BMI 34.35 kg/m    Review of Systems She denies hypoglycemia    Objective:   Physical Exam VITAL SIGNS:  See vs page GENERAL: no distress Pulses: dorsalis pedis intact bilat.   MSK: no deformity of the feet CV: no leg edema Skin:  no ulcer on the feet.  normal color and temp on the feet. Neuro: sensation is intact to touch on the feet  Lab Results  Component Value Date   HGBA1C 7.1 (A) 01/15/2020   Lab Results  Component Value Date   CREATININE 0.83 12/15/2019   BUN 16 12/15/2019   NA 141 12/15/2019   K 4.1 12/15/2019   CL 103 12/15/2019   CO2 25 12/15/2019       Assessment & Plan:  Insulin-requiring type 2 DM: this is the best control this pt should aim for, given this regimen, which does match insulin to her changing needs throughout the day SDOH: she is on qd insulin, due to noncompliance.   Patient Instructions  Please increase the [place] and reduce the [place]. Please continue the same other diabetes medications.   check your blood sugar twice a day.  vary the time of day  when you check, between before the 3 meals, and at bedtime.  also check if you have symptoms of your blood sugar being too high or too low.  please keep a record of the readings and bring it to your next appointment here (or you can bring the meter itself).  You can write it on any piece of paper.  please call us sooner if your blood sugar goes below 70, or if you have a lot of readings over 200.  Please come back for a follow-up appointment in 3 months.

## 2020-02-04 LAB — BASIC METABOLIC PANEL
BUN/Creatinine Ratio: 21 (ref 12–28)
BUN: 21 mg/dL (ref 8–27)
CO2: 23 mmol/L (ref 20–29)
Calcium: 10.1 mg/dL (ref 8.7–10.3)
Chloride: 104 mmol/L (ref 96–106)
Creatinine, Ser: 1.01 mg/dL — ABNORMAL HIGH (ref 0.57–1.00)
GFR calc Af Amer: 70 mL/min/{1.73_m2} (ref >59)
GFR calc non Af Amer: 61 mL/min/{1.73_m2} (ref >59)
Glucose: 203 mg/dL — ABNORMAL HIGH (ref 65–99)
Potassium: 4.6 mmol/L (ref 3.5–5.2)
Sodium: 142 mmol/L (ref 134–144)

## 2020-02-04 LAB — MAGNESIUM: Magnesium: 1.9 mg/dL (ref 1.6–2.3)

## 2020-02-06 ENCOUNTER — Other Ambulatory Visit: Payer: Self-pay | Admitting: Physician Assistant

## 2020-02-06 DIAGNOSIS — M5412 Radiculopathy, cervical region: Secondary | ICD-10-CM

## 2020-02-12 ENCOUNTER — Telehealth (HOSPITAL_COMMUNITY): Payer: Self-pay | Admitting: Emergency Medicine

## 2020-02-12 ENCOUNTER — Encounter (HOSPITAL_COMMUNITY): Payer: Self-pay

## 2020-02-12 NOTE — Telephone Encounter (Signed)
Reaching out to patient to offer assistance regarding upcoming cardiac imaging study; pt verbalizes understanding of appt date/time, parking situation and where to check in, pre-test NPO status and medications ordered, and verified current allergies; name and call back number provided for further questions should they arise Jakeem Grape RN Navigator Cardiac Imaging [facility] [phone] office [phone] cell 

## 2020-02-15 ENCOUNTER — Other Ambulatory Visit: Payer: Self-pay

## 2020-02-15 ENCOUNTER — Ambulatory Visit (HOSPITAL_COMMUNITY)
Admission: RE | Admit: 2020-02-15 | Discharge: 2020-02-15 | Disposition: A | Discharge status: Home / Self Care | Payer: PRIVATE HEALTH INSURANCE | Source: Ambulatory Visit | Attending: Cardiology | Admitting: Cardiology

## 2020-02-15 ENCOUNTER — Other Ambulatory Visit: Payer: Self-pay | Admitting: Endocrinology

## 2020-02-15 ENCOUNTER — Encounter (HOSPITAL_COMMUNITY): Payer: Self-pay

## 2020-02-15 ENCOUNTER — Encounter: Payer: PRIVATE HEALTH INSURANCE | Admitting: *Deleted

## 2020-02-15 DIAGNOSIS — Z006 Encounter for examination for normal comparison and control in clinical research program: Secondary | ICD-10-CM

## 2020-02-15 DIAGNOSIS — R079 Chest pain, unspecified: Secondary | ICD-10-CM | POA: Diagnosis present

## 2020-02-15 MED ORDER — IOHEXOL 350 MG/ML SOLN
80.0000 mL | Freq: Once | INTRAVENOUS | Status: AC | PRN
  Administered 2020-02-15: 80 mL via INTRAVENOUS

## 2020-02-15 MED ORDER — NITROGLYCERIN 0.4 MG SL SUBL
SUBLINGUAL_TABLET | SUBLINGUAL | Status: AC
  Filled 2020-02-15: qty 2

## 2020-02-15 MED ORDER — NITROGLYCERIN 0.4 MG SL SUBL
0.8000 mg | SUBLINGUAL_TABLET | Freq: Once | SUBLINGUAL | Status: AC
  Administered 2020-02-15: 0.8 mg via SUBLINGUAL

## 2020-02-15 NOTE — Progress Notes (Signed)
Patient tolerated CT well. Drank coffee and ate cookies after. Ambulated to exit steady gait.

## 2020-02-15 NOTE — Research (Signed)
CADFEM Informed Consent                  Subject Name:   [name]   Subject met inclusion and exclusion criteria.  The informed consent form, study requirements and expectations were reviewed with the subject and questions and concerns were addressed prior to the signing of the consent form.  The subject verbalized understanding of the trial requirements.  The subject agreed to participate in the CADFEM trial and signed the informed consent.  The informed consent was obtained prior to performance of any protocol-specific procedures for the subject.  A copy of the signed informed consent was given to the subject and a copy was placed in the subject's medical record.   [place] Shilpa Bushee, Research Assistant  02/15/2020 10:37 a.m.

## 2020-03-09 ENCOUNTER — Other Ambulatory Visit: Payer: Self-pay

## 2020-03-09 ENCOUNTER — Other Ambulatory Visit: Payer: Self-pay | Admitting: Physician Assistant

## 2020-03-09 ENCOUNTER — Encounter: Payer: Self-pay | Admitting: Cardiology

## 2020-03-09 ENCOUNTER — Ambulatory Visit (INDEPENDENT_AMBULATORY_CARE_PROVIDER_SITE_OTHER): Payer: PRIVATE HEALTH INSURANCE | Admitting: Cardiology

## 2020-03-09 VITALS — BP 112/80 | HR 90 | Ht 66.0 in | Wt 212.0 lb

## 2020-03-09 DIAGNOSIS — I5189 Other ill-defined heart diseases: Secondary | ICD-10-CM | POA: Diagnosis not present

## 2020-03-09 DIAGNOSIS — R5383 Other fatigue: Secondary | ICD-10-CM

## 2020-03-09 DIAGNOSIS — E119 Type 2 diabetes mellitus without complications: Secondary | ICD-10-CM

## 2020-03-09 DIAGNOSIS — I10 Essential (primary) hypertension: Secondary | ICD-10-CM

## 2020-03-09 DIAGNOSIS — E785 Hyperlipidemia, unspecified: Secondary | ICD-10-CM

## 2020-03-09 DIAGNOSIS — R400 Somnolence: Secondary | ICD-10-CM

## 2020-03-09 NOTE — Progress Notes (Signed)
Cardiology Office Note:    Date:  03/09/2020   ID:  [name], DOB 1961/12/03, MRN [id number]  PCP:  Brunetta Jeans, PA-C  Cardiologist:  Berniece Salines, DO  Electrophysiologist:  None   Referring MD: Brunetta Jeans, PA-C   Chief Complaint  Patient presents with  . Follow-up   History of Present Illness:    [name] is a 59 y.o. female with a hx of hypertension, diastolic dysfunction, hyperlipidemia, diabetes, obesity and history of COVID-19 infection presents today for follow-up.  Last saw the patient in December 15, 2019 at that time she reported that she had been experiencing chest pain.  Given her risk factors the patient was sent for coronary CTA.  She is here today to discuss the results.  She denies any chest pain but notes that she has been expressing significant fatigue and daytime somnolence.  Past Medical History:  Diagnosis Date  . Anemia   . Arthritis    bilateral knees  . Breast cancer screening 12/20/2015  . Bronchitis    hx of  . Carpal tunnel syndrome 12/29/2015  . Cervical radiculopathy 11/28/2015  . Diabetes mellitus without complication ([place]) [id number]  . Diastolic dysfunction [id number]  . Essential hypertension 06/13/2016  . Fibroid tumor    Had partial hysterectomy  . Gallstones   . Gastroesophageal reflux disease without esophagitis 03/11/2018  . Lesion of ulnar nerve 12/29/2015  . Migraine    hx of  . Obesity (BMI 30-39.9) 11/04/2019  . Obesity (BMI 30.0-34.9) 03/31/2019  . Primary osteoarthritis of both knees 10/10/2014  . Trigger finger, acquired 10/10/2014  . Uncontrolled type 2 diabetes mellitus with hyperglycemia ([place]) 04/15/2018  . Visit for preventive health examination 12/20/2015    Past Surgical History:  Procedure Laterality Date  . ABDOMINAL HYSTERECTOMY     partial   . boil removed     . CARPAL TUNNEL RELEASE  04-15-15  . CHOLECYSTECTOMY  Oct 2012  . ELBOW SURGERY  04-15-15  . HAND SURGERY  04-15-15  . Nerve damage  04-15-15  .  TOTAL KNEE ARTHROPLASTY  08/01/2012   Procedure: TOTAL KNEE ARTHROPLASTY;  Surgeon: Augustin Schooling, MD;  Location: [facility];  Service: Orthopedics;  Laterality: Right;  RIGHT TOTAL KNEE ARTHROPLASTY  . TRIGGER FINGER RELEASE  04-15-15  . TUBAL LIGATION    . WISDOM TOOTH EXTRACTION      Current Medications: Current Meds  Medication Sig  . atorvastatin (LIPITOR) 10 MG tablet TAKE 1 TABLET BY MOUTH EVERY DAY  . azelastine (ASTELIN) 0.1 % nasal spray Place 2 sprays into both nostrils 2 (two) times daily. Use in each nostril as directed  . BAYER CONTOUR TEST test strip USE TWICE DAILY AS INSTRUCTED TO CHECK SUGARS. DX E. 11.9  . BLACK COHOSH EXTRACT PO Take by mouth.  . Cinnamon 500 MG capsule Take 1,000 mg by mouth 2 (two) times daily.  . cyclobenzaprine (FLEXERIL) 10 MG tablet TAKE 1 TABLET AT BEDTIME  . Echinacea-Goldenseal (ECHINACEA COMB/GOLDEN SEAL PO) Take by mouth.  . empagliflozin (JARDIANCE) 25 MG TABS tablet Take 25 mg by mouth daily.  . fluocinonide cream (LIDEX) [id number] % Apply 1 application topically 2 (two) times daily.  . fluticasone (FLONASE) 50 MCG/ACT nasal spray Place 2 sprays into both nostrils daily.  . furosemide (LASIX) 20 MG tablet Take 1 tablet (20 mg total) by mouth daily.  Marland Kitchen gabapentin (NEURONTIN) 300 MG capsule TAKE 1 CAPSULE 3 TIMES A   DAY  . GLUCOSAMINE  PO Take 1 tablet by mouth 2 (two) times daily.  Marland Kitchen glucose blood (CONTOUR NEXT TEST) test strip Used to check blood sugars 2x daily. DX CODE E11.9.  . Insulin Degludec (TRESIBA FLEXTOUCH) 200 UNIT/ML SOPN Inject 100 Units into the skin daily.  . Insulin Pen Needle (BD PEN NEEDLE NANO U/F) 32G X 4 MM MISC Used to give daily insulin injections.  Marland Kitchen JARDIANCE 10 MG TABS tablet TAKE 10 MG BY MOUTH DAILY.  Marland Kitchen levocetirizine (XYZAL) 5 MG tablet Take 1 tablet (5 mg total) by mouth every evening.  . meloxicam (MOBIC) 15 MG tablet TAKE 1 TABLET BY MOUTH EVERY DAY  . metFORMIN (GLUCOPHAGE) 500 MG tablet TAKE 1 TABLET (500 MG TOTAL)  BY MOUTH 2 (TWO) TIMES DAILY WITH A MEAL.  Marland Kitchen MICROLET LANCETS MISC USE TO MONITOR GLUCOSE LEVELS TWICE DAILY  . Misc Natural Products (HERBAL ENERGY COMPLEX PO) Take by mouth 2 (two) times daily with a meal.  . Multiple Vitamins-Minerals (WOMENS MULTIVITAMIN PO) Take by mouth daily.  . naproxen (NAPROSYN) 500 MG tablet Take 1 tablet (500 mg total) by mouth 2 (two) times daily with a meal.  . Omega-3 1000 MG CAPS Take 1 g by mouth daily.  Marland Kitchen omeprazole (PRILOSEC) 20 MG capsule Take 1 capsule (20 mg total) by mouth daily.  . ONGLYZA 5 MG TABS tablet TAKE 1 TABLET BY MOUTH EVERY DAY  . vitamin B-12 (CYANOCOBALAMIN) 100 MCG tablet Take 100 mcg by mouth daily.  . vitamin C (ASCORBIC ACID) 500 MG tablet Take 1,000 mg by mouth daily.  . vitamin E 400 UNIT capsule Take 400 Units by mouth daily. Reported on 02/13/2016     Allergies:   Penicillins and Tramadol   Social History   Socioeconomic History  . Marital status: Single    Spouse name: Not on file  . Number of children: Not on file  . Years of education: Not on file  . Highest education level: Not on file  Occupational History  . Not on file  Tobacco Use  . Smoking status: Former Smoker    Packs/day: 0.25    Years: 20.00    Pack years: 5.00    Types: Cigarettes    Quit date: 12/09/2009    Years since quitting: 10.2  . Smokeless tobacco: Never Used  Substance and Sexual Activity  . Alcohol use: Not Currently    Alcohol/week: 0.0 standard drinks    Comment: social  . Drug use: No  . Sexual activity: Not Currently  Other Topics Concern  . Not on file  Social History Narrative  . Not on file   Social Determinants of Health   Financial Resource Strain:   . Difficulty of Paying Living Expenses:   Food Insecurity:   . Worried About Charity fundraiser in the Last Year:   . Arboriculturist in the Last Year:   Transportation Needs:   . Film/video editor (Medical):   Marland Kitchen Lack of Transportation (Non-Medical):   Physical  Activity:   . Days of Exercise per Week:   . Minutes of Exercise per Session:   Stress:   . Feeling of Stress :   Social Connections:   . Frequency of Communication with Friends and Family:   . Frequency of Social Gatherings with Friends and Family:   . Attends Religious Services:   . Active Member of Clubs or Organizations:   . Attends Archivist Meetings:   Marland Kitchen Marital Status:  Family History: The patient's family history includes Arthritis in an other family member; Asthma in her mother; Breast cancer (age of onset: 46) in her cousin; Colon cancer in her maternal uncle; Diabetes in her brother, mother, and sister; Diabetes (age of onset: 87) in her father; Healthy in her son; Heart disease in her maternal aunt and maternal uncle; Hyperlipidemia in her mother; Kidney failure (age of onset: 59) in her brother; Tuberculosis in her mother. There is no history of Esophageal cancer, Rectal cancer, or Stomach cancer.  ROS:   Review of Systems  Constitution: Negative for decreased appetite, fever and weight gain.  HENT: Negative for congestion, ear discharge, hoarse voice and sore throat.   Eyes: Negative for discharge, redness, vision loss in right eye and visual halos.  Cardiovascular: Negative for chest pain, dyspnea on exertion, leg swelling, orthopnea and palpitations.  Respiratory: Negative for cough, hemoptysis, shortness of breath and snoring.   Endocrine: Negative for heat intolerance and polyphagia.  Hematologic/Lymphatic: Negative for bleeding problem. Does not bruise/bleed easily.  Skin: Negative for flushing, nail changes, rash and suspicious lesions.  Musculoskeletal: Negative for arthritis, joint pain, muscle cramps, myalgias, neck pain and stiffness.  Gastrointestinal: Negative for abdominal pain, bowel incontinence, diarrhea and excessive appetite.  Genitourinary: Negative for decreased libido, genital sores and incomplete emptying.  Neurological: Negative for  brief paralysis, focal weakness, headaches and loss of balance.  Psychiatric/Behavioral: Negative for altered mental status, depression and suicidal ideas.  Allergic/Immunologic: Negative for HIV exposure and persistent infections.    EKGs/Labs/Other Studies Reviewed:    The following studies were reviewed today:   EKG: None today  Coronary CTA IMPRESSION: 1. Minimal CAD, CADRADS = 1.  2. Coronary calcium score of 0. This was 0 percentile for age and sex matched control.  3. Normal coronary origin with right dominance.  4. Mildly increased LV wall thickness at the basal septum, 13 mm.  5. 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  Recent Labs: 10/09/2019: TSH 1.50 02/03/2020: BUN 21; Creatinine, Ser 1.01; Magnesium 1.9; Potassium 4.6; Sodium 142  Recent Lipid Panel    Component Value Date/Time   CHOL 175 11/04/2019 1128   TRIG 83 11/04/2019 1128   HDL 73 11/04/2019 1128   CHOLHDL 2.4 11/04/2019 1128   CHOLHDL 2 03/11/2018 0920   VLDL 12.2 03/11/2018 0920   LDLCALC 87 11/04/2019 1128    Physical Exam:    VS:  BP 112/80 (BP Location: Right Arm, Patient Position: Sitting, Cuff Size: Normal)   Pulse 90   Ht 5\' 6"  (1.676 m)   Wt 212 lb (96.2 kg)   SpO2 95%   BMI 34.22 kg/m     Wt Readings from Last 3 Encounters:  03/09/20 212 lb (96.2 kg)  01/15/20 212 lb 12.8 oz (96.5 kg)  12/15/19 213 lb (96.6 kg)     GEN: Well nourished, well developed in no acute distress HEENT: Normal NECK: No JVD; No carotid bruits LYMPHATICS: No lymphadenopathy CARDIAC: S1S2 noted,RRR, no murmurs, rubs, gallops RESPIRATORY:  Clear to auscultation without rales, wheezing or rhonchi  ABDOMEN: Soft, non-tender, non-distended, +bowel sounds, no guarding. EXTREMITIES: No edema, No cyanosis, no clubbing MUSCULOSKELETAL:  No deformity  SKIN: Warm and dry NEUROLOGIC:  Alert and oriented x 3, non-focal PSYCHIATRIC:  Normal affect, good  insight  ASSESSMENT:    1. Fatigue, unspecified type   2. Essential hypertension   3. Diabetes mellitus without complication ([place])   4. Diastolic  dysfunction   5. Other fatigue   6. Daytime somnolence    PLAN:     1.  Discussed the results coronary CTA showing minimal CAD.  She tells me her symptoms has resolved.  She will continue to take her Lipitor 10 mg daily.  She has had aspirin in the past but reported she has had GI upset but she is willing to try this again.  Also did share with patient that she does have evidence of mildly dilated pulmonary artery right was 26 mm (normal 20 mm) and left was an 28 mm (normal 22 mm).  She is asymptomatic for any shortness of breath-her echocardiogram done in November 2020 with no report of pulmonary artery systolic pressure as there was no TR spectral display.  I will continue to monitor the patient if need for further testing will do so.  2.  For fatigue and daytime somnolence the patient will benefit from a sleep study to rule out obstructive sleep apnea.  3.  Hypertension her blood pressure deceptively in the office today.No changes will be made to her antihypertensive medication.  4.  Hyperlipidemia continue patient on Lipitor.  5.  Obesity -she continues to exercise in an attempt to lose weight.  The patient is in agreement with the above plan. The patient left the office in stable condition.  The patient will follow up in 1 year or sooner if needed.   Medication Adjustments/Labs and Tests Ordered: Current medicines are reviewed at length with the patient today.  Concerns regarding medicines are outlined above.  Orders Placed This Encounter  Procedures  . Split night study   No orders of the defined types were placed in this encounter.   Patient Instructions  Medication Instructions:  No medication changes *If you need a refill on your cardiac medications before your next appointment, please call your pharmacy*   Lab Work: None  ordered If you have labs (blood work) drawn today and your tests are completely normal, you will receive your results only by: Marland Kitchen MyChart Message (if you have MyChart) OR . A paper copy in the mail If you have any lab test that is abnormal or we need to change your treatment, we will call you to review the results.   Testing/Procedures: Your physician has recommended that you have a sleep study. This test records several body functions during sleep, including: brain activity, eye movement, oxygen and carbon dioxide blood levels, heart rate and rhythm, breathing rate and rhythm, the flow of air through your mouth and nose, snoring, body muscle movements, and chest and belly movement.    Follow-Up: At [facility], you and your health needs are our priority.  As part of our continuing mission to provide you with exceptional heart care, we have created designated Provider Care Teams.  These Care Teams include your primary Cardiologist (physician) and Advanced Practice Providers (APPs -  Physician Assistants and Nurse Practitioners) who all work together to provide you with the care you need, when you need it.  We recommend signing up for the patient portal called "MyChart".  Sign up information is provided on this After Visit Summary.  MyChart is used to connect with patients for Virtual Visits (Telemedicine).  Patients are able to view lab/test results, encounter notes, upcoming appointments, etc.  Non-urgent messages can be sent to your provider as well.   To learn more about what you can do with MyChart, go to [email or web address].    Your next appointment:   1  year(s)  The format for your next appointment:   In Person  Provider:   Berniece Salines, DO   Other Instructions  Sleep Studies A sleep study (polysomnogram) is a series of tests done while you are sleeping. A sleep study records your brain waves, heart rate, breathing rate, oxygen level, and eye and leg movements. A sleep  study helps your health care provider:  See how well you sleep.  Diagnose a sleep disorder.  Determine how severe your sleep disorder is.  Create a plan to treat your sleep disorder. Your health care provider may recommend a sleep study if you:  Feel sleepy on most days.  Snore loudly while sleeping.  Have unusual behaviors while you sleep, such as walking.  Have brief periods in which you stop breathing during sleep (sleepapnea).  Fall asleep suddenly during the day (narcolepsy).  Have trouble falling asleep or staying asleep (insomnia).  Feel like you need to move your legs when trying to fall asleep (restless legs syndrome).  Move your legs by flexing and extending them regularly while asleep (periodic limb movement disorder).  Act out your dreams while you sleep (sleep behavior disorder).  Feel like you cannot move when you first wake up (sleep paralysis). What tests are part of a sleep study? Most sleep studies record the following during sleep:  Brain activity.  Eye movements.  Heart rate and rhythm.  Breathing rate and rhythm.  Blood-oxygen level.  Blood pressure.  Chest and belly movement as you breathe.  Arm and leg movements.  Snoring or other noises.  Body position. Where are sleep studies done? Sleep studies are done at sleep centers. A sleep center may be inside a hospital, office, or clinic. The room where you have the study may look like a hospital room or a hotel room. The health care providers doing the study may come in and out of the room during the study. Most of the time, they will be in another room monitoring your test as you sleep. How are sleep studies done? Most sleep studies are done during a normal period of time for a full night of sleep. You will arrive at the study center in the evening and go home in the morning. Before the test  Bring your pajamas and toothbrush with you to the sleep study.  Do not have caffeine on the day  of your sleep study.  Do not drink alcohol on the day of your sleep study.  Your health care provider will let you know if you should stop taking any of your regular medicines before the test. During the test      Round, sticky patches with sensors attached to recording wires (electrodes) are placed on your scalp, face, chest, and limbs.  Wires from all the electrodes and sensors run from your bed to a computer. The wires can be taken off and put back on if you need to get out of bed to go to the bathroom.  A sensor is placed over your nose to measure airflow.  A finger clip is put on your finger or ear to measure your blood oxygen level (pulse oximetry).  A belt is placed around your belly and a belt is placed around your chest to measure breathing movements.  If you have signs of the sleep disorder called sleep apnea during your test, you may get a treatment mask to wear for the second half of the night. ? The mask provides positive airway pressure (PAP) to  help you breathe better during sleep. This may greatly improve your sleep apnea. ? You will then have all tests done again with the mask in place to see if your measurements and recordings change. After the test  A medical doctor who specializes in sleep will evaluate the results of your sleep study and share them with you and your primary health care provider.  Based on your results, your medical history, and a physical exam, you may be diagnosed with a sleep disorder, such as: ? Sleep apnea. ? Restless legs syndrome. ? Sleep-related behavior disorder. ? Sleep-related movement disorders. ? Sleep-related seizure disorders.  Your health care team will help determine your treatment options based on your diagnosis. This may include: ? Improving your sleep habits (sleep hygiene). ? Wearing a continuous positive airway pressure (CPAP) or bi-level positive airway pressure (BPAP) mask. ? Wearing an oral device at night to improve  breathing and reduce snoring. ? Taking medicines. Follow these instructions at home:  Take over-the-counter and prescription medicines only as told by your health care provider.  If you are instructed to use a CPAP or BPAP mask, make sure you use it nightly as directed.  Make any lifestyle changes that your health care provider recommends.  If you were given a device to open your airway while you sleep, use it only as told by your health care provider.  Do not use any tobacco products, such as cigarettes, chewing tobacco, and e-cigarettes. If you need help quitting, ask your health care provider.  Keep all follow-up visits as told by your health care provider. This is important. Summary  A sleep study (polysomnogram) is a series of tests done while you are sleeping. It shows how well you sleep.  Most sleep studies are done over one full night of sleep. You will arrive at the study center in the evening and go home in the morning.  If you have signs of the sleep disorder called sleep apnea during your test, you may get a treatment mask to wear for the second half of the night.  A medical doctor who specializes in sleep will evaluate the results of your sleep study and share them with your primary health care provider. This information is not intended to replace advice given to you by your health care provider. Make sure you discuss any questions you have with your health care provider. Document Revised: 05/13/2019 Document Reviewed: 12/24/2017 Elsevier Patient Education  [place].      Adopting a Healthy Lifestyle.  Know what a healthy weight is for you (roughly BMI <25) and aim to maintain this   Aim for 7+ servings of fruits and vegetables daily   65-80+ fluid ounces of water or unsweet tea for healthy kidneys   Limit to max 1 drink of alcohol per day; avoid smoking/tobacco   Limit animal fats in diet for cholesterol and heart health - choose grass fed whenever  available   Avoid highly processed foods, and foods high in saturated/trans fats   Aim for low stress - take time to unwind and care for your mental health   Aim for 150 min of moderate intensity exercise weekly for heart health, and weights twice weekly for bone health   Aim for 7-9 hours of sleep daily   When it comes to diets, agreement about the perfect plan isnt easy to find, even among the experts. Experts at the [employer] developed an idea known as the [employer]  Plate. Just imagine a plate divided into logical, healthy portions.   The emphasis is on diet quality:   Load up on vegetables and fruits - one-half of your plate: Aim for color and variety, and remember that potatoes dont count.   Go for whole grains - one-quarter of your plate: Whole wheat, barley, wheat berries, quinoa, oats, brown rice, and foods made with them. If you want pasta, go with whole wheat pasta.   Protein power - one-quarter of your plate: Fish, chicken, beans, and nuts are all healthy, versatile protein sources. Limit red meat.   The diet, however, does go beyond the plate, offering a few other suggestions.   Use healthy plant oils, such as olive, canola, soy, corn, sunflower and peanut. Check the labels, and avoid partially hydrogenated oil, which have unhealthy trans fats.   If youre thirsty, drink water. Coffee and tea are good in moderation, but skip sugary drinks and limit milk and dairy products to one or two daily servings.   The type of carbohydrate in the diet is more important than the amount. Some sources of carbohydrates, such as vegetables, fruits, whole grains, and beans-are healthier than others.   Finally, stay active  Signed, Berniece Salines, DO  03/09/2020 3:43 PM    [facility]

## 2020-03-09 NOTE — Patient Instructions (Signed)
Medication Instructions:  No medication changes *If you need a refill on your cardiac medications before your next appointment, please call your pharmacy*   Lab Work: None ordered If you have labs (blood work) drawn today and your tests are completely normal, you will receive your results only by: Marland Kitchen MyChart Message (if you have MyChart) OR . A paper copy in the mail If you have any lab test that is abnormal or we need to change your treatment, we will call you to review the results.   Testing/Procedures: Your physician has recommended that you have a sleep study. This test records several body functions during sleep, including: brain activity, eye movement, oxygen and carbon dioxide blood levels, heart rate and rhythm, breathing rate and rhythm, the flow of air through your mouth and nose, snoring, body muscle movements, and chest and belly movement.    Follow-Up: At [facility], you and your health needs are our priority.  As part of our continuing mission to provide you with exceptional heart care, we have created designated Provider Care Teams.  These Care Teams include your primary Cardiologist (physician) and Advanced Practice Providers (APPs -  Physician Assistants and Nurse Practitioners) who all work together to provide you with the care you need, when you need it.  We recommend signing up for the patient portal called "MyChart".  Sign up information is provided on this After Visit Summary.  MyChart is used to connect with patients for Virtual Visits (Telemedicine).  Patients are able to view lab/test results, encounter notes, upcoming appointments, etc.  Non-urgent messages can be sent to your provider as well.   To learn more about what you can do with MyChart, go to [email or web address].    Your next appointment:   1 year(s)  The format for your next appointment:   In Person  Provider:   Berniece Salines, DO   Other Instructions  Sleep Studies A sleep study  (polysomnogram) is a series of tests done while you are sleeping. A sleep study records your brain waves, heart rate, breathing rate, oxygen level, and eye and leg movements. A sleep study helps your health care provider:  See how well you sleep.  Diagnose a sleep disorder.  Determine how severe your sleep disorder is.  Create a plan to treat your sleep disorder. Your health care provider may recommend a sleep study if you:  Feel sleepy on most days.  Snore loudly while sleeping.  Have unusual behaviors while you sleep, such as walking.  Have brief periods in which you stop breathing during sleep (sleepapnea).  Fall asleep suddenly during the day (narcolepsy).  Have trouble falling asleep or staying asleep (insomnia).  Feel like you need to move your legs when trying to fall asleep (restless legs syndrome).  Move your legs by flexing and extending them regularly while asleep (periodic limb movement disorder).  Act out your dreams while you sleep (sleep behavior disorder).  Feel like you cannot move when you first wake up (sleep paralysis). What tests are part of a sleep study? Most sleep studies record the following during sleep:  Brain activity.  Eye movements.  Heart rate and rhythm.  Breathing rate and rhythm.  Blood-oxygen level.  Blood pressure.  Chest and belly movement as you breathe.  Arm and leg movements.  Snoring or other noises.  Body position. Where are sleep studies done? Sleep studies are done at sleep centers. A sleep center may be inside a hospital, office, or clinic. The room where  you have the study may look like a hospital room or a hotel room. The health care providers doing the study may come in and out of the room during the study. Most of the time, they will be in another room monitoring your test as you sleep. How are sleep studies done? Most sleep studies are done during a normal period of time for a full night of sleep. You will  arrive at the study center in the evening and go home in the morning. Before the test  Bring your pajamas and toothbrush with you to the sleep study.  Do not have caffeine on the day of your sleep study.  Do not drink alcohol on the day of your sleep study.  Your health care provider will let you know if you should stop taking any of your regular medicines before the test. During the test      Round, sticky patches with sensors attached to recording wires (electrodes) are placed on your scalp, face, chest, and limbs.  Wires from all the electrodes and sensors run from your bed to a computer. The wires can be taken off and put back on if you need to get out of bed to go to the bathroom.  A sensor is placed over your nose to measure airflow.  A finger clip is put on your finger or ear to measure your blood oxygen level (pulse oximetry).  A belt is placed around your belly and a belt is placed around your chest to measure breathing movements.  If you have signs of the sleep disorder called sleep apnea during your test, you may get a treatment mask to wear for the second half of the night. ? The mask provides positive airway pressure (PAP) to help you breathe better during sleep. This may greatly improve your sleep apnea. ? You will then have all tests done again with the mask in place to see if your measurements and recordings change. After the test  A medical doctor who specializes in sleep will evaluate the results of your sleep study and share them with you and your primary health care provider.  Based on your results, your medical history, and a physical exam, you may be diagnosed with a sleep disorder, such as: ? Sleep apnea. ? Restless legs syndrome. ? Sleep-related behavior disorder. ? Sleep-related movement disorders. ? Sleep-related seizure disorders.  Your health care team will help determine your treatment options based on your diagnosis. This may include: ? Improving  your sleep habits (sleep hygiene). ? Wearing a continuous positive airway pressure (CPAP) or bi-level positive airway pressure (BPAP) mask. ? Wearing an oral device at night to improve breathing and reduce snoring. ? Taking medicines. Follow these instructions at home:  Take over-the-counter and prescription medicines only as told by your health care provider.  If you are instructed to use a CPAP or BPAP mask, make sure you use it nightly as directed.  Make any lifestyle changes that your health care provider recommends.  If you were given a device to open your airway while you sleep, use it only as told by your health care provider.  Do not use any tobacco products, such as cigarettes, chewing tobacco, and e-cigarettes. If you need help quitting, ask your health care provider.  Keep all follow-up visits as told by your health care provider. This is important. Summary  A sleep study (polysomnogram) is a series of tests done while you are sleeping. It shows how well you sleep.  Most sleep studies are done over one full night of sleep. You will arrive at the study center in the evening and go home in the morning.  If you have signs of the sleep disorder called sleep apnea during your test, you may get a treatment mask to wear for the second half of the night.  A medical doctor who specializes in sleep will evaluate the results of your sleep study and share them with your primary health care provider. This information is not intended to replace advice given to you by your health care provider. Make sure you discuss any questions you have with your health care provider. Document Revised: 05/13/2019 Document Reviewed: 12/24/2017 Elsevier Patient Education  [place].

## 2020-03-16 ENCOUNTER — Other Ambulatory Visit: Payer: Self-pay | Admitting: Endocrinology

## 2020-03-16 ENCOUNTER — Other Ambulatory Visit: Payer: Self-pay | Admitting: Physician Assistant

## 2020-03-25 ENCOUNTER — Other Ambulatory Visit: Payer: Self-pay | Admitting: Physician Assistant

## 2020-03-25 DIAGNOSIS — M5412 Radiculopathy, cervical region: Secondary | ICD-10-CM

## 2020-04-08 ENCOUNTER — Ambulatory Visit (INDEPENDENT_AMBULATORY_CARE_PROVIDER_SITE_OTHER): Payer: PRIVATE HEALTH INSURANCE | Admitting: Physician Assistant

## 2020-04-08 ENCOUNTER — Encounter: Payer: Self-pay | Admitting: Physician Assistant

## 2020-04-08 ENCOUNTER — Other Ambulatory Visit: Payer: Self-pay

## 2020-04-08 VITALS — BP 130/84 | HR 100 | Temp 98.3°F | Resp 16 | Ht 66.0 in | Wt 219.0 lb

## 2020-04-08 DIAGNOSIS — Z0000 Encounter for general adult medical examination without abnormal findings: Secondary | ICD-10-CM

## 2020-04-08 DIAGNOSIS — E669 Obesity, unspecified: Secondary | ICD-10-CM

## 2020-04-08 DIAGNOSIS — Z0001 Encounter for general adult medical examination with abnormal findings: Secondary | ICD-10-CM | POA: Diagnosis not present

## 2020-04-08 DIAGNOSIS — M25511 Pain in right shoulder: Secondary | ICD-10-CM

## 2020-04-08 DIAGNOSIS — E1165 Type 2 diabetes mellitus with hyperglycemia: Secondary | ICD-10-CM | POA: Diagnosis not present

## 2020-04-08 LAB — COMPREHENSIVE METABOLIC PANEL
ALT: 40 U/L — ABNORMAL HIGH (ref 0–35)
AST: 47 U/L — ABNORMAL HIGH (ref 0–37)
Albumin: 4.4 g/dL (ref 3.5–5.2)
Alkaline Phosphatase: 125 U/L — ABNORMAL HIGH (ref 39–117)
BUN: 23 mg/dL (ref 6–23)
CO2: 24 mEq/L (ref 19–32)
Calcium: 10 mg/dL (ref 8.4–10.5)
Chloride: 103 mEq/L (ref 96–112)
Creatinine, Ser: 0.76 mg/dL (ref 0.40–1.20)
GFR: 93.77 mL/min (ref >60.00)
Glucose, Bld: 71 mg/dL (ref 70–99)
Potassium: 4.2 mEq/L (ref 3.5–5.1)
Sodium: 137 mEq/L (ref 135–145)
Total Bilirubin: 0.5 mg/dL (ref 0.2–1.2)
Total Protein: 7.3 g/dL (ref 6.0–8.3)

## 2020-04-08 LAB — CBC WITH DIFFERENTIAL/PLATELET
Basophils Absolute: 0 10*3/uL (ref 0.0–0.1)
Basophils Relative: 0.1 % (ref 0.0–3.0)
Eosinophils Absolute: 0 10*3/uL (ref 0.0–0.7)
Eosinophils Relative: 0.3 % (ref 0.0–5.0)
HCT: 39.8 % (ref 36.0–46.0)
Hemoglobin: 13.2 g/dL (ref 12.0–15.0)
Lymphocytes Relative: 10.2 % — ABNORMAL LOW (ref 12.0–46.0)
Lymphs Abs: 0.8 10*3/uL (ref 0.7–4.0)
MCHC: 33 g/dL (ref 30.0–36.0)
MCV: 89 fl (ref 78.0–100.0)
Monocytes Absolute: 0.6 10*3/uL (ref 0.1–1.0)
Monocytes Relative: 7.6 % (ref 3.0–12.0)
Neutro Abs: 6.5 10*3/uL (ref 1.4–7.7)
Neutrophils Relative %: 81.8 % — ABNORMAL HIGH (ref 43.0–77.0)
Platelets: 300 10*3/uL (ref 150.0–400.0)
RBC: 4.47 Mil/uL (ref 3.87–5.11)
RDW: 14.6 % (ref 11.5–15.5)
WBC: 7.9 10*3/uL (ref 4.0–10.5)

## 2020-04-08 LAB — TSH: TSH: 0.65 u[IU]/mL (ref 0.35–4.50)

## 2020-04-08 LAB — LIPID PANEL
Cholesterol: 179 mg/dL (ref 0–200)
HDL: 77.6 mg/dL (ref >39.00)
LDL Cholesterol: 89 mg/dL (ref 0–99)
NonHDL: 101.37
Total CHOL/HDL Ratio: 2
Triglycerides: 61 mg/dL (ref 0.0–149.0)
VLDL: 12.2 mg/dL (ref 0.0–40.0)

## 2020-04-08 MED ORDER — CELECOXIB 100 MG PO CAPS: 100.0000 mg | ORAL_CAPSULE | Freq: Two times a day (BID) | ORAL | 0 refills | Status: DC

## 2020-04-08 NOTE — Progress Notes (Signed)
Patient presents to clinic today for annual exam.  Patient is fasting for labs.  Acute Concerns: Patient endorses R lateral shoulder pain over the past 1-2 week, worse with ROM. Denies any trauma/injury. Denies numbness or tingling. Endorses prior history of partial rotator cuff tear, previously seen by Sports Medicine at [facility].   Health Maintenance: Immunizations -- UTD Colonoscopy -- UTD Mammogram -- UTD  Past Medical History:  Diagnosis Date  . Anemia   . Arthritis    bilateral knees  . Breast cancer screening 12/20/2015  . Bronchitis    hx of  . Carpal tunnel syndrome 12/29/2015  . Cervical radiculopathy 11/28/2015  . Diabetes mellitus without complication ([place]) [id number]  . Diastolic dysfunction [id number]  . Essential hypertension 06/13/2016  . Fibroid tumor    Had partial hysterectomy  . Gallstones   . Gastroesophageal reflux disease without esophagitis 03/11/2018  . Lesion of ulnar nerve 12/29/2015  . Migraine    hx of  . Obesity (BMI 30-39.9) 11/04/2019  . Obesity (BMI 30.0-34.9) 03/31/2019  . Primary osteoarthritis of both knees 10/10/2014  . Trigger finger, acquired 10/10/2014  . Uncontrolled type 2 diabetes mellitus with hyperglycemia ([place]) 04/15/2018  . Visit for preventive health examination 12/20/2015    Past Surgical History:  Procedure Laterality Date  . ABDOMINAL HYSTERECTOMY     partial   . boil removed     . CARPAL TUNNEL RELEASE  04-15-15  . CHOLECYSTECTOMY  Oct 2012  . ELBOW SURGERY  04-15-15  . HAND SURGERY  04-15-15  . Nerve damage  04-15-15  . TOTAL KNEE ARTHROPLASTY  08/01/2012   Procedure: TOTAL KNEE ARTHROPLASTY;  Surgeon: Augustin Schooling, MD;  Location: [place];  Service: Orthopedics;  Laterality: Right;  RIGHT TOTAL KNEE ARTHROPLASTY  . TRIGGER FINGER RELEASE  04-15-15  . TUBAL LIGATION    . WISDOM TOOTH EXTRACTION      Current Outpatient Medications on File Prior to Visit  Medication Sig Dispense Refill  . atorvastatin (LIPITOR) 10 MG tablet TAKE 1  TABLET BY MOUTH EVERY DAY 90 tablet 1  . azelastine (ASTELIN) 0.1 % nasal spray Place 2 sprays into both nostrils 2 (two) times daily. Use in each nostril as directed 30 mL 12  . BAYER CONTOUR TEST test strip USE TWICE DAILY AS INSTRUCTED TO CHECK SUGARS. DX E. 11.9 100 each 5  . BLACK COHOSH EXTRACT PO Take by mouth.    . Cinnamon 500 MG capsule Take 1,000 mg by mouth 2 (two) times daily.    . cyclobenzaprine (FLEXERIL) 10 MG tablet TAKE 1 TABLET AT BEDTIME 90 tablet 1  . Echinacea-Goldenseal (ECHINACEA COMB/GOLDEN SEAL PO) Take by mouth.    . empagliflozin (JARDIANCE) 25 MG TABS tablet Take 25 mg by mouth daily. 90 tablet 3  . fluocinonide cream (LIDEX) [id number] % Apply 1 application topically 2 (two) times daily.    . fluticasone (FLONASE) 50 MCG/ACT nasal spray Place 2 sprays into both nostrils daily. 16 g 2  . furosemide (LASIX) 20 MG tablet Take 1 tablet (20 mg total) by mouth daily. 90 tablet 1  . gabapentin (NEURONTIN) 300 MG capsule TAKE 1 CAPSULE 3 TIMES A   DAY 270 capsule 1  . GLUCOSAMINE PO Take 1 tablet by mouth 2 (two) times daily.    Marland Kitchen glucose blood (CONTOUR NEXT TEST) test strip Used to check blood sugars 2x daily. DX CODE E11.9. 100 each 12  . Insulin Degludec (TRESIBA FLEXTOUCH) 200 UNIT/ML SOPN Inject 100 Units  into the skin daily. 6 pen 5  . Insulin Pen Needle (BD PEN NEEDLE NANO U/F) 32G X 4 MM MISC 1 each by Other route daily. E11.9 100 each 0  . JARDIANCE 10 MG TABS tablet TAKE 10 MG BY MOUTH DAILY. 90 tablet 4  . levocetirizine (XYZAL) 5 MG tablet Take 1 tablet (5 mg total) by mouth every evening. 90 tablet 1  . meloxicam (MOBIC) 15 MG tablet TAKE 1 TABLET BY MOUTH EVERY DAY 30 tablet 2  . metFORMIN (GLUCOPHAGE) 500 MG tablet TAKE 1 TABLET (500 MG TOTAL) BY MOUTH 2 (TWO) TIMES DAILY WITH A MEAL. 180 tablet 3  . MICROLET LANCETS MISC USE TO MONITOR GLUCOSE LEVELS TWICE DAILY 100 each 1  . Misc Natural Products (HERBAL ENERGY COMPLEX PO) Take by mouth 2 (two) times daily with  a meal.    . Multiple Vitamins-Minerals (WOMENS MULTIVITAMIN PO) Take by mouth daily.    . naproxen (NAPROSYN) 500 MG tablet Take 1 tablet (500 mg total) by mouth 2 (two) times daily with a meal. 20 tablet 0  . Omega-3 1000 MG CAPS Take 1 g by mouth daily.    Marland Kitchen omeprazole (PRILOSEC) 20 MG capsule Take 1 capsule (20 mg total) by mouth daily. 90 capsule 1  . ONGLYZA 5 MG TABS tablet TAKE 1 TABLET BY MOUTH EVERY DAY 90 tablet 3  . vitamin B-12 (CYANOCOBALAMIN) 100 MCG tablet Take 100 mcg by mouth daily.    . vitamin C (ASCORBIC ACID) 500 MG tablet Take 1,000 mg by mouth daily.    . vitamin E 400 UNIT capsule Take 400 Units by mouth daily. Reported on 02/13/2016    . losartan (COZAAR) 25 MG tablet Take 1 tablet (25 mg total) by mouth daily. 90 tablet 1   No current facility-administered medications on file prior to visit.    Allergies  Allergen Reactions  . Penicillins Hives    Hives   . Tramadol Itching    Family History  Problem Relation Age of Onset  . Diabetes Father 43       Deceased  . Diabetes Mother        Living  . Hyperlipidemia Mother   . Tuberculosis Mother   . Asthma Mother   . Heart disease Maternal Uncle   . Colon cancer Maternal Uncle        dx in 60's  . Diabetes Brother   . Heart disease Maternal Aunt   . Arthritis Other        Maternal Aunts & Uncles  . Diabetes Sister   . Kidney failure Brother 30       Diseased  . Healthy Son        x1  . Breast cancer Cousin 103  . Esophageal cancer Neg Hx   . Rectal cancer Neg Hx   . Stomach cancer Neg Hx     Social History   Socioeconomic History  . Marital status: Single    Spouse name: Not on file  . Number of children: Not on file  . Years of education: Not on file  . Highest education level: Not on file  Occupational History  . Not on file  Tobacco Use  . Smoking status: Former Smoker    Packs/day: 0.25    Years: 20.00    Pack years: 5.00    Types: Cigarettes    Quit date: 12/09/2009    Years since  quitting: 10.3  . Smokeless tobacco: Never Used  Substance and  Sexual Activity  . Alcohol use: Not Currently    Alcohol/week: 0.0 standard drinks    Comment: social  . Drug use: No  . Sexual activity: Not Currently  Other Topics Concern  . Not on file  Social History Narrative  . Not on file   Social Determinants of Health   Financial Resource Strain:   . Difficulty of Paying Living Expenses:   Food Insecurity:   . Worried About Charity fundraiser in the Last Year:   . Arboriculturist in the Last Year:   Transportation Needs:   . Film/video editor (Medical):   Marland Kitchen Lack of Transportation (Non-Medical):   Physical Activity:   . Days of Exercise per Week:   . Minutes of Exercise per Session:   Stress:   . Feeling of Stress :   Social Connections:   . Frequency of Communication with Friends and Family:   . Frequency of Social Gatherings with Friends and Family:   . Attends Religious Services:   . Active Member of Clubs or Organizations:   . Attends Archivist Meetings:   Marland Kitchen Marital Status:   Intimate Partner Violence:   . Fear of Current or Ex-Partner:   . Emotionally Abused:   Marland Kitchen Physically Abused:   . Sexually Abused:    Review of Systems  Constitutional: Negative for fever and weight loss.  HENT: Negative for ear discharge, ear pain, hearing loss and tinnitus.   Eyes: Negative for blurred vision, double vision, photophobia and pain.  Respiratory: Negative for cough and shortness of breath.   Cardiovascular: Negative for chest pain and palpitations.  Gastrointestinal: Negative for abdominal pain, blood in stool, constipation, diarrhea, heartburn, melena, nausea and vomiting.  Genitourinary: Negative for dysuria, flank pain, frequency, hematuria and urgency.  Musculoskeletal: Positive for joint pain. Negative for falls.  Neurological: Negative for dizziness, loss of consciousness and headaches.  Endo/Heme/Allergies: Negative for environmental allergies.   Psychiatric/Behavioral: Negative for depression, hallucinations, substance abuse and suicidal ideas. The patient is not nervous/anxious and does not have insomnia.     Resp 16   Ht 5\' 6"  (1.676 m)   Wt 219 lb (99.3 kg)   BMI 35.35 kg/m   Physical Exam Vitals reviewed.  HENT:     Head: Normocephalic and atraumatic.     Right Ear: Tympanic membrane, ear canal and external ear normal.     Left Ear: Tympanic membrane, ear canal and external ear normal.     Nose: Nose normal. No mucosal edema.     Mouth/Throat:     Pharynx: Uvula midline. No oropharyngeal exudate or posterior oropharyngeal erythema.  Eyes:     Conjunctiva/sclera: Conjunctivae normal.     Pupils: Pupils are equal, round, and reactive to light.  Neck:     Thyroid: No thyromegaly.  Cardiovascular:     Rate and Rhythm: Normal rate and regular rhythm.     Heart sounds: Normal heart sounds.  Pulmonary:     Effort: Pulmonary effort is normal. No respiratory distress.     Breath sounds: Normal breath sounds. No wheezing or rales.  Abdominal:     General: Bowel sounds are normal. There is no distension.     Palpations: Abdomen is soft. There is no mass.     Tenderness: There is no abdominal tenderness. There is no guarding or rebound.  Musculoskeletal:     Right shoulder: No swelling, deformity or effusion. Decreased range of motion (secondary to pain with ER and  IR, abduction past 90 deg). Normal strength.     Cervical back: Neck supple.  Lymphadenopathy:     Cervical: No cervical adenopathy.  Skin:    General: Skin is warm and dry.     Findings: No rash.  Neurological:     Mental Status: She is alert and oriented to person, place, and time.     Cranial Nerves: No cranial nerve deficit.    Recent Results (from the past 2160 hour(s))  POCT HgB A1C     Status: Abnormal   Collection Time: 01/15/20 11:07 AM  Result Value Ref Range   Hemoglobin A1C 7.1 (A) 4.0 - 5.6 %   HbA1c POC (<> result, manual entry)      HbA1c, POC (prediabetic range)     HbA1c, POC (controlled diabetic range)    Basic metabolic panel     Status: Abnormal   Collection Time: 02/03/20 11:01 AM  Result Value Ref Range   Glucose 203 (H) 65 - 99 mg/dL   BUN 21 8 - 27 mg/dL   Creatinine, Ser 1.01 (H) 0.57 - 1.00 mg/dL   GFR calc non Af Amer 61 >59 mL/min/1.73   GFR calc Af Amer 70 >59 mL/min/1.73   BUN/Creatinine Ratio 21 12 - 28   Sodium 142 134 - 144 mmol/L   Potassium 4.6 3.5 - 5.2 mmol/L   Chloride 104 96 - 106 mmol/L   CO2 23 20 - 29 mmol/L   Calcium 10.1 8.7 - 10.3 mg/dL  Magnesium     Status: None   Collection Time: 02/03/20 11:01 AM  Result Value Ref Range   Magnesium 1.9 1.6 - 2.3 mg/dL    Assessment/Plan: 1. Visit for preventive health examination Depression screen negative. Health Maintenance reviewed. Preventive schedule discussed and handout given in AVS. Will obtain fasting labs today.  - Comprehensive metabolic panel - CBC with Differential/Platelet - Lipid panel - TSH - Urine Microalbumin w/creat. ratio  2. Uncontrolled type 2 diabetes mellitus with hyperglycemia ([place]) Followed by Endocrinology. Overdue for microalbumin. Will obtain today. - Urine Microalbumin w/creat. ratio  3. Obesity (BMI 30.0-34.9) Dietary and exercise recommendations reviewed. Continue current medication regimen.   4. Acute pain of right shoulder Concern for flare of rotator cuff tendonopathy. Strength intact. Rest. Begin Celebrex BID. If not improving will need follow-up with Sports Medicine or Ortho for further evaluation. - celecoxib (CELEBREX) 100 MG capsule; Take 1 capsule (100 mg total) by mouth 2 (two) times daily. To use instead of Meloxicam  Dispense: 60 capsule; Refill: 0  This visit occurred during the SARS-CoV-2 public health emergency.  Safety protocols were in place, including screening questions prior to the visit, additional usage of staff PPE, and extensive cleaning of exam room while observing appropriate  contact time as indicated for disinfecting solutions.     Leeanne Rio, PA-C

## 2020-04-08 NOTE — Patient Instructions (Signed)
Please go to the lab for blood work.   Our office will call you with your results unless you have chosen to receive results via MyChart.  If your blood work is normal we will follow-up each year for physicals and as scheduled for chronic medical problems.  If anything is abnormal we will treat accordingly and get you in for a follow-up.  Please stop the Meloxicam. Start the Celebrex as directed. Heating pad to the arm for 10-15 minutes a few times per day. Avoid heavy lifting and overexertion. If things are not improving we will need to get you back in with Sports Med.  For the feet, apply OTC Lamisil ointment twice daily x 2 weeks. Keep up with moisturizing regimen.  Let me know if things are not improving/resolving.    Preventive Care 61-47 Years Old, Female Preventive care refers to visits with your health care provider and lifestyle choices that can promote health and wellness. This includes:  A yearly physical exam. This may also be called an annual well check.  Regular dental visits and eye exams.  Immunizations.  Screening for certain conditions.  Healthy lifestyle choices, such as eating a healthy diet, getting regular exercise, not using drugs or products that contain nicotine and tobacco, and limiting alcohol use. What can I expect for my preventive care visit? Physical exam Your health care provider will check your:  Height and weight. This may be used to calculate body mass index (BMI), which tells if you are at a healthy weight.  Heart rate and blood pressure.  Skin for abnormal spots. Counseling Your health care provider may ask you questions about your:  Alcohol, tobacco, and drug use.  Emotional well-being.  Home and relationship well-being.  Sexual activity.  Eating habits.  Work and work Statistician.  Method of birth control.  Menstrual cycle.  Pregnancy history. What immunizations do I need?  Influenza (flu) vaccine  This is  recommended every year. Tetanus, diphtheria, and pertussis (Tdap) vaccine  You may need a Td booster every 10 years. Varicella (chickenpox) vaccine  You may need this if you have not been vaccinated. Zoster (shingles) vaccine  You may need this after age 57. Measles, mumps, and rubella (MMR) vaccine  You may need at least one dose of MMR if you were born in 1957 or later. You may also need a second dose. Pneumococcal conjugate (PCV13) vaccine  You may need this if you have certain conditions and were not previously vaccinated. Pneumococcal polysaccharide (PPSV23) vaccine  You may need one or two doses if you smoke cigarettes or if you have certain conditions. Meningococcal conjugate (MenACWY) vaccine  You may need this if you have certain conditions. Hepatitis A vaccine  You may need this if you have certain conditions or if you travel or work in places where you may be exposed to hepatitis A. Hepatitis B vaccine  You may need this if you have certain conditions or if you travel or work in places where you may be exposed to hepatitis B. Haemophilus influenzae type b (Hib) vaccine  You may need this if you have certain conditions. Human papillomavirus (HPV) vaccine  If recommended by your health care provider, you may need three doses over 6 months. You may receive vaccines as individual doses or as more than one vaccine together in one shot (combination vaccines). Talk with your health care provider about the risks and benefits of combination vaccines. What tests do I need? Blood tests  Lipid and cholesterol  levels. These may be checked every 5 years, or more frequently if you are over 24 years old.  Hepatitis C test.  Hepatitis B test. Screening  Lung cancer screening. You may have this screening every year starting at age 25 if you have a 30-pack-year history of smoking and currently smoke or have quit within the past 15 years.  Colorectal cancer screening. All adults  should have this screening starting at age 75 and continuing until age 61. Your health care provider may recommend screening at age 60 if you are at increased risk. You will have tests every 1-10 years, depending on your results and the type of screening test.  Diabetes screening. This is done by checking your blood sugar (glucose) after you have not eaten for a while (fasting). You may have this done every 1-3 years.  Mammogram. This may be done every 1-2 years. Talk with your health care provider about when you should start having regular mammograms. This may depend on whether you have a family history of breast cancer.  BRCA-related cancer screening. This may be done if you have a family history of breast, ovarian, tubal, or peritoneal cancers.  Pelvic exam and Pap test. This may be done every 3 years starting at age 95. Starting at age 32, this may be done every 5 years if you have a Pap test in combination with an HPV test. Other tests  Sexually transmitted disease (STD) testing.  Bone density scan. This is done to screen for osteoporosis. You may have this scan if you are at high risk for osteoporosis. Follow these instructions at home: Eating and drinking  Eat a diet that includes fresh fruits and vegetables, whole grains, lean protein, and low-fat dairy.  Take vitamin and mineral supplements as recommended by your health care provider.  Do not drink alcohol if: ? Your health care provider tells you not to drink. ? You are pregnant, may be pregnant, or are planning to become pregnant.  If you drink alcohol: ? Limit how much you have to 0-1 drink a day. ? Be aware of how much alcohol is in your drink. In the [place] one drink equals one 12 oz bottle of beer (355 mL), one 5 oz glass of wine (148 mL), or one 1 oz glass of hard liquor (44 mL). Lifestyle  Take daily care of your teeth and gums.  Stay active. Exercise for at least 30 minutes on 5 or more days each week.  Do not use  any products that contain nicotine or tobacco, such as cigarettes, e-cigarettes, and chewing tobacco. If you need help quitting, ask your health care provider.  If you are sexually active, practice safe sex. Use a condom or other form of birth control (contraception) in order to prevent pregnancy and STIs (sexually transmitted infections).  If told by your health care provider, take low-dose aspirin daily starting at age 53. What's next?  Visit your health care provider once a year for a well check visit.  Ask your health care provider how often you should have your eyes and teeth checked.  Stay up to date on all vaccines. This information is not intended to replace advice given to you by your health care provider. Make sure you discuss any questions you have with your health care provider. Document Revised: 08/07/2018 Document Reviewed: 08/07/2018 Elsevier Patient Education  2020 [employer].

## 2020-04-09 LAB — MICROALBUMIN / CREATININE URINE RATIO
Creatinine, Urine: 94 mg/dL (ref 20–275)
Microalb Creat Ratio: 9 mcg/mg creat (ref <30)
Microalb, Ur: 0.8 mg/dL

## 2020-04-11 ENCOUNTER — Other Ambulatory Visit: Payer: PRIVATE HEALTH INSURANCE

## 2020-04-11 DIAGNOSIS — R7989 Other specified abnormal findings of blood chemistry: Secondary | ICD-10-CM

## 2020-04-12 ENCOUNTER — Other Ambulatory Visit: Payer: Self-pay

## 2020-04-12 DIAGNOSIS — R748 Abnormal levels of other serum enzymes: Secondary | ICD-10-CM

## 2020-04-12 LAB — HEPATITIS PANEL, ACUTE
Hep A IgM: NONREACTIVE
Hep B C IgM: NONREACTIVE
Hepatitis B Surface Ag: NONREACTIVE
Hepatitis C Ab: NONREACTIVE
SIGNAL TO CUT-OFF: 0.02 (ref <1.00)

## 2020-04-12 NOTE — Progress Notes (Signed)
lft

## 2020-04-14 ENCOUNTER — Other Ambulatory Visit: Payer: Self-pay

## 2020-04-18 ENCOUNTER — Ambulatory Visit: Payer: PRIVATE HEALTH INSURANCE | Admitting: Endocrinology

## 2020-04-20 ENCOUNTER — Other Ambulatory Visit: Payer: Self-pay

## 2020-04-22 ENCOUNTER — Ambulatory Visit (INDEPENDENT_AMBULATORY_CARE_PROVIDER_SITE_OTHER): Payer: PRIVATE HEALTH INSURANCE | Admitting: Endocrinology

## 2020-04-22 ENCOUNTER — Encounter: Payer: Self-pay | Admitting: Endocrinology

## 2020-04-22 ENCOUNTER — Other Ambulatory Visit: Payer: Self-pay | Admitting: Physician Assistant

## 2020-04-22 ENCOUNTER — Other Ambulatory Visit: Payer: Self-pay | Admitting: Endocrinology

## 2020-04-22 ENCOUNTER — Other Ambulatory Visit: Payer: Self-pay

## 2020-04-22 ENCOUNTER — Other Ambulatory Visit: Payer: Self-pay | Admitting: Cardiology

## 2020-04-22 VITALS — BP 134/80 | HR 90 | Ht 66.0 in | Wt 220.0 lb

## 2020-04-22 DIAGNOSIS — E1165 Type 2 diabetes mellitus with hyperglycemia: Secondary | ICD-10-CM | POA: Diagnosis not present

## 2020-04-22 DIAGNOSIS — M62838 Other muscle spasm: Secondary | ICD-10-CM

## 2020-04-22 LAB — POCT GLYCOSYLATED HEMOGLOBIN (HGB A1C): Hemoglobin A1C: 7.3 % — AB (ref 4.0–5.6)

## 2020-04-22 MED ORDER — TRESIBA FLEXTOUCH 200 UNIT/ML ~~LOC~~ SOPN: 80.0000 [IU] | PEN_INJECTOR | Freq: Every day | SUBCUTANEOUS | 5 refills | Status: DC

## 2020-04-22 MED ORDER — TRULICITY 0.75 MG/0.5ML ~~LOC~~ SOAJ: 0.7500 mg | SUBCUTANEOUS | 11 refills | Status: DC

## 2020-04-22 NOTE — Patient Instructions (Addendum)
I have sent a prescription to your pharmacy, to add "Trulicity," and: Reduce the insulin to 80 units per day, and: Please continue the same other diabetes medications.   check your blood sugar twice a day.  vary the time of day when you check, between before the 3 meals, and at bedtime.  also check if you have symptoms of your blood sugar being too high or too low.  please keep a record of the readings and bring it to your next appointment here (or you can bring the meter itself).  You can write it on any piece of paper.  please call us sooner if your blood sugar goes below 70, or if you have a lot of readings over 200.  Please come back for a follow-up appointment in 2 months.

## 2020-04-22 NOTE — Progress Notes (Signed)
Subjective:    Patient ID: [name], female    DOB: Mar 28, 1961, 59 y.o.   MRN: [id number]  HPI Pt returns for f/u of diabetes mellitus:  DM type: Insulin-requiring type 2.  Dx'ed: 2017, when she had a cbg of 400 on steroids.  Complications: none.  Therapy: insulin and 3 oral meds.   GDM: never.   DKA: never.  Severe hypoglycemia: never.  Pancreatitis: never.    SDOH: she is on qd insulin, due to f/o noncompliance.   Interval history: she took a course of oral steroids for right shoulder pain, 2 weeks ago.  This increased cbg's.  Pt says cbg's vary from 89-350.  pt states she feels well in general.  pt states she feels well in general.   Past Medical History:  Diagnosis Date  . Anemia   . Arthritis    bilateral knees  . Breast cancer screening 12/20/2015  . Bronchitis    hx of  . Carpal tunnel syndrome 12/29/2015  . Cervical radiculopathy 11/28/2015  . Diabetes mellitus without complication ([place]) [id number]  . Diastolic dysfunction [id number]  . Essential hypertension 06/13/2016  . Fibroid tumor    Had partial hysterectomy  . Gallstones   . Gastroesophageal reflux disease without esophagitis 03/11/2018  . Lesion of ulnar nerve 12/29/2015  . Migraine    hx of  . Obesity (BMI 30-39.9) 11/04/2019  . Obesity (BMI 30.0-34.9) 03/31/2019  . Primary osteoarthritis of both knees 10/10/2014  . Trigger finger, acquired 10/10/2014  . Uncontrolled type 2 diabetes mellitus with hyperglycemia ([place]) 04/15/2018  . Visit for preventive health examination 12/20/2015    Past Surgical History:  Procedure Laterality Date  . ABDOMINAL HYSTERECTOMY     partial   . boil removed     . CARPAL TUNNEL RELEASE  04-15-15  . CHOLECYSTECTOMY  Oct 2012  . ELBOW SURGERY  04-15-15  . HAND SURGERY  04-15-15  . Nerve damage  04-15-15  . TOTAL KNEE ARTHROPLASTY  08/01/2012   Procedure: TOTAL KNEE ARTHROPLASTY;  Surgeon: Augustin Schooling, MD;  Location: [facility];  Service: Orthopedics;  Laterality: Right;  RIGHT TOTAL  KNEE ARTHROPLASTY  . TRIGGER FINGER RELEASE  04-15-15  . TUBAL LIGATION    . WISDOM TOOTH EXTRACTION      Social History   Socioeconomic History  . Marital status: Single    Spouse name: Not on file  . Number of children: Not on file  . Years of education: Not on file  . Highest education level: Not on file  Occupational History  . Not on file  Tobacco Use  . Smoking status: Former Smoker    Packs/day: 0.25    Years: 20.00    Pack years: 5.00    Types: Cigarettes    Quit date: 12/09/2009    Years since quitting: 10.3  . Smokeless tobacco: Never Used  Substance and Sexual Activity  . Alcohol use: Not Currently    Alcohol/week: 0.0 standard drinks    Comment: social  . Drug use: No  . Sexual activity: Not Currently  Other Topics Concern  . Not on file  Social History Narrative  . Not on file   Social Determinants of Health   Financial Resource Strain:   . Difficulty of Paying Living Expenses:   Food Insecurity:   . Worried About Charity fundraiser in the Last Year:   . Arboriculturist in the Last Year:   Transportation Needs:   . Lack  of Transportation (Medical):   Marland Kitchen Lack of Transportation (Non-Medical):   Physical Activity:   . Days of Exercise per Week:   . Minutes of Exercise per Session:   Stress:   . Feeling of Stress :   Social Connections:   . Frequency of Communication with Friends and Family:   . Frequency of Social Gatherings with Friends and Family:   . Attends Religious Services:   . Active Member of Clubs or Organizations:   . Attends Archivist Meetings:   Marland Kitchen Marital Status:   Intimate Partner Violence:   . Fear of Current or Ex-Partner:   . Emotionally Abused:   Marland Kitchen Physically Abused:   . Sexually Abused:     Current Outpatient Medications on File Prior to Visit  Medication Sig Dispense Refill  . atorvastatin (LIPITOR) 10 MG tablet TAKE 1 TABLET BY MOUTH EVERY DAY 90 tablet 1  . azelastine (ASTELIN) 0.1 % nasal spray Place 2  sprays into both nostrils 2 (two) times daily. Use in each nostril as directed 30 mL 12  . BAYER CONTOUR TEST test strip USE TWICE DAILY AS INSTRUCTED TO CHECK SUGARS. DX E. 11.9 100 each 5  . BLACK COHOSH EXTRACT PO Take by mouth.    . celecoxib (CELEBREX) 100 MG capsule Take 1 capsule (100 mg total) by mouth 2 (two) times daily. To use instead of Meloxicam 60 capsule 0  . Cinnamon 500 MG capsule Take 1,000 mg by mouth 2 (two) times daily.    Marland Kitchen Echinacea-Goldenseal (ECHINACEA COMB/GOLDEN SEAL PO) Take by mouth.    . empagliflozin (JARDIANCE) 25 MG TABS tablet Take 25 mg by mouth daily. 90 tablet 3  . fluocinonide cream (LIDEX) [id number] % Apply 1 application topically 2 (two) times daily.    . fluticasone (FLONASE) 50 MCG/ACT nasal spray Place 2 sprays into both nostrils daily. 16 g 2  . furosemide (LASIX) 20 MG tablet Take 1 tablet (20 mg total) by mouth daily. 90 tablet 1  . gabapentin (NEURONTIN) 300 MG capsule TAKE 1 CAPSULE 3 TIMES A   DAY 270 capsule 1  . GLUCOSAMINE PO Take 1 tablet by mouth 2 (two) times daily.    Marland Kitchen glucose blood (CONTOUR NEXT TEST) test strip Used to check blood sugars 2x daily. DX CODE E11.9. 100 each 12  . Insulin Pen Needle (BD PEN NEEDLE NANO U/F) 32G X 4 MM MISC 1 each by Other route daily. E11.9 100 each 0  . JARDIANCE 10 MG TABS tablet TAKE 10 MG BY MOUTH DAILY. 90 tablet 4  . levocetirizine (XYZAL) 5 MG tablet Take 1 tablet (5 mg total) by mouth every evening. 90 tablet 1  . meloxicam (MOBIC) 15 MG tablet TAKE 1 TABLET BY MOUTH EVERY DAY 30 tablet 2  . metFORMIN (GLUCOPHAGE) 500 MG tablet TAKE 1 TABLET (500 MG TOTAL) BY MOUTH 2 (TWO) TIMES DAILY WITH A MEAL. 180 tablet 3  . MICROLET LANCETS MISC USE TO MONITOR GLUCOSE LEVELS TWICE DAILY 100 each 1  . Misc Natural Products (HERBAL ENERGY COMPLEX PO) Take by mouth 2 (two) times daily with a meal.    . Multiple Vitamins-Minerals (WOMENS MULTIVITAMIN PO) Take by mouth daily.    . naproxen (NAPROSYN) 500 MG tablet Take 1  tablet (500 mg total) by mouth 2 (two) times daily with a meal. 20 tablet 0  . Omega-3 1000 MG CAPS Take 1 g by mouth daily.    Marland Kitchen omeprazole (PRILOSEC) 20 MG capsule Take 1 capsule (  20 mg total) by mouth daily. 90 capsule 1  . vitamin B-12 (CYANOCOBALAMIN) 100 MCG tablet Take 100 mcg by mouth daily.    . vitamin C (ASCORBIC ACID) 500 MG tablet Take 1,000 mg by mouth daily.    . vitamin E 400 UNIT capsule Take 400 Units by mouth daily. Reported on 02/13/2016     No current facility-administered medications on file prior to visit.    Allergies  Allergen Reactions  . Penicillins Hives    Hives   . Tramadol Itching    Family History  Problem Relation Age of Onset  . Diabetes Father 11       Deceased  . Diabetes Mother        Living  . Hyperlipidemia Mother   . Tuberculosis Mother   . Asthma Mother   . Heart disease Maternal Uncle   . Colon cancer Maternal Uncle        dx in 47's  . Diabetes Brother   . Heart disease Maternal Aunt   . Arthritis Other        Maternal Aunts & Uncles  . Diabetes Sister   . Kidney failure Brother 30       Diseased  . Healthy Son        x1  . Breast cancer Cousin 10  . Esophageal cancer Neg Hx   . Rectal cancer Neg Hx   . Stomach cancer Neg Hx     BP 134/80   Pulse 90   Ht 5\' 6"  (1.676 m)   Wt 220 lb (99.8 kg)   SpO2 98%   BMI 35.51 kg/m    Review of Systems She denies hypoglycemia    Objective:   Physical Exam VITAL SIGNS:  See vs page GENERAL: no distress Pulses: dorsalis pedis intact bilat.   MSK: no deformity of the feet CV: no leg edema Skin:  no ulcer on the feet.  normal color and temp on the feet. Neuro: sensation is intact to touch on the feet  Lab Results  Component Value Date   HGBA1C 7.3 (A) 04/22/2020    Lab Results  Component Value Date   CREATININE 0.76 04/08/2020   BUN 23 04/08/2020   NA 137 04/08/2020   K 4.2 04/08/2020   CL 103 04/08/2020   CO2 24 04/08/2020      Assessment & Plan:   Insulin-requiring type 2 DM: this is the best control this pt should aim for, given this regimen, which does match insulin to her changing needs throughout the day Shoulder pain: increased A1c is a side effect of steroid injection  Patient Instructions  I have sent a prescription to your pharmacy, to add "Trulicity," and: Reduce the insulin to 80 units per day, and: Please continue the same other diabetes medications.   check your blood sugar twice a day.  vary the time of day when you check, between before the 3 meals, and at bedtime.  also check if you have symptoms of your blood sugar being too high or too low.  please keep a record of the readings and bring it to your next appointment here (or you can bring the meter itself).  You can write it on any piece of paper.  please call us sooner if your blood sugar goes below 70, or if you have a lot of readings over 200.  Please come back for a follow-up appointment in 2 months.

## 2020-05-01 ENCOUNTER — Other Ambulatory Visit: Payer: Self-pay | Admitting: Physician Assistant

## 2020-05-01 DIAGNOSIS — M25511 Pain in right shoulder: Secondary | ICD-10-CM

## 2020-05-03 ENCOUNTER — Ambulatory Visit: Payer: PRIVATE HEALTH INSURANCE

## 2020-05-03 ENCOUNTER — Other Ambulatory Visit: Payer: Self-pay | Admitting: Physician Assistant

## 2020-05-03 ENCOUNTER — Ambulatory Visit (INDEPENDENT_AMBULATORY_CARE_PROVIDER_SITE_OTHER): Payer: PRIVATE HEALTH INSURANCE | Admitting: Physician Assistant

## 2020-05-03 ENCOUNTER — Other Ambulatory Visit: Payer: Self-pay

## 2020-05-03 ENCOUNTER — Encounter: Payer: Self-pay | Admitting: Physician Assistant

## 2020-05-03 VITALS — BP 122/80 | HR 85 | Temp 98.0°F | Resp 16 | Ht 66.0 in | Wt 227.0 lb

## 2020-05-03 DIAGNOSIS — Z8739 Personal history of other diseases of the musculoskeletal system and connective tissue: Secondary | ICD-10-CM

## 2020-05-03 DIAGNOSIS — M67911 Unspecified disorder of synovium and tendon, right shoulder: Secondary | ICD-10-CM

## 2020-05-03 DIAGNOSIS — R748 Abnormal levels of other serum enzymes: Secondary | ICD-10-CM | POA: Diagnosis not present

## 2020-05-03 LAB — HEPATIC FUNCTION PANEL
ALT: 25 U/L (ref 0–35)
AST: 23 U/L (ref 0–37)
Albumin: 4.2 g/dL (ref 3.5–5.2)
Alkaline Phosphatase: 132 U/L — ABNORMAL HIGH (ref 39–117)
Bilirubin, Direct: 0.1 mg/dL (ref 0.0–0.3)
Total Bilirubin: 0.3 mg/dL (ref 0.2–1.2)
Total Protein: 6.8 g/dL (ref 6.0–8.3)

## 2020-05-03 MED ORDER — CYCLOBENZAPRINE HCL 10 MG PO TABS: ORAL_TABLET | ORAL | 1 refills | Status: DC

## 2020-05-03 NOTE — Patient Instructions (Signed)
Please go to the lab today for blood work.  I will call you with your results. We will alter treatment regimen(s) if indicated by your results.   Please continue the Meloxicam.  Continue Flexeril at bedtime. Consider topical Voltaren gel (OTC) to the area.  You will be contacted for further assessment by Sports medicine. If you do not hear from them within a few days, please let us know.

## 2020-05-03 NOTE — Progress Notes (Signed)
Patient presents to clinic today c/o continued pain of R shoulder with pain in ROM despite taking the Celebrex as directed along with her muscle relaxant. Notes the Celebrex was not very helpful so has switched back to her Meloxicam. Has history of rotator cuff tear in 2017 s/p PT, etc with Sports Medicine. Would like referral back to specialist.   Past Medical History:  Diagnosis Date  . Anemia   . Arthritis    bilateral knees  . Breast cancer screening 12/20/2015  . Bronchitis    hx of  . Carpal tunnel syndrome 12/29/2015  . Cervical radiculopathy 11/28/2015  . Diabetes mellitus without complication ([place]) [id number]  . Diastolic dysfunction [id number]  . Essential hypertension 06/13/2016  . Fibroid tumor    Had partial hysterectomy  . Gallstones   . Gastroesophageal reflux disease without esophagitis 03/11/2018  . Lesion of ulnar nerve 12/29/2015  . Migraine    hx of  . Obesity (BMI 30-39.9) 11/04/2019  . Obesity (BMI 30.0-34.9) 03/31/2019  . Primary osteoarthritis of both knees 10/10/2014  . Trigger finger, acquired 10/10/2014  . Uncontrolled type 2 diabetes mellitus with hyperglycemia ([place]) 04/15/2018  . Visit for preventive health examination 12/20/2015    Current Outpatient Medications on File Prior to Visit  Medication Sig Dispense Refill  . atorvastatin (LIPITOR) 10 MG tablet TAKE 1 TABLET BY MOUTH EVERY DAY 90 tablet 1  . azelastine (ASTELIN) 0.1 % nasal spray Place 2 sprays into both nostrils 2 (two) times daily. Use in each nostril as directed 30 mL 12  . BAYER CONTOUR TEST test strip USE TWICE DAILY AS INSTRUCTED TO CHECK SUGARS. DX E. 11.9 100 each 5  . BLACK COHOSH EXTRACT PO Take by mouth.    . celecoxib (CELEBREX) 100 MG capsule TAKE 1 CAPSULE (100 MG TOTAL) BY MOUTH 2 (TWO) TIMES DAILY. TO USE INSTEAD OF MELOXICAM 60 capsule 0  . Cinnamon 500 MG capsule Take 1,000 mg by mouth 2 (two) times daily.    . cyclobenzaprine (FLEXERIL) 10 MG tablet TAKE 1 TABLET BY MOUTH EVERYDAY  AT BEDTIME 30 tablet 1  . Dulaglutide (TRULICITY) [id number] 0000000 SOPN Inject 0.75 mg into the skin once a week. 4 pen 11  . Echinacea-Goldenseal (ECHINACEA COMB/GOLDEN SEAL PO) Take by mouth.    . empagliflozin (JARDIANCE) 25 MG TABS tablet Take 25 mg by mouth daily. 90 tablet 3  . fluocinonide cream (LIDEX) [id number] % Apply 1 application topically 2 (two) times daily.    . fluticasone (FLONASE) 50 MCG/ACT nasal spray Place 2 sprays into both nostrils daily. 16 g 2  . furosemide (LASIX) 20 MG tablet Take 1 tablet (20 mg total) by mouth daily. 90 tablet 1  . gabapentin (NEURONTIN) 300 MG capsule TAKE 1 CAPSULE 3 TIMES A   DAY 270 capsule 1  . GLUCOSAMINE PO Take 1 tablet by mouth 2 (two) times daily.    Marland Kitchen glucose blood (CONTOUR NEXT TEST) test strip Used to check blood sugars 2x daily. DX CODE E11.9. 100 each 12  . insulin degludec (TRESIBA FLEXTOUCH) 200 UNIT/ML FlexTouch Pen Inject 80 Units into the skin daily. 6 pen 5  . Insulin Pen Needle (BD PEN NEEDLE NANO U/F) 32G X 4 MM MISC 1 each by Other route daily. E11.9 100 each 0  . JARDIANCE 10 MG TABS tablet TAKE 10 MG BY MOUTH DAILY. 90 tablet 4  . levocetirizine (XYZAL) 5 MG tablet Take 1 tablet (5 mg total) by mouth every evening. 90 tablet 1  .  losartan (COZAAR) 25 MG tablet TAKE 1 TABLET BY MOUTH EVERY DAY 90 tablet 2  . meloxicam (MOBIC) 15 MG tablet TAKE 1 TABLET BY MOUTH EVERY DAY 30 tablet 2  . metFORMIN (GLUCOPHAGE) 500 MG tablet TAKE 1 TABLET (500 MG TOTAL) BY MOUTH 2 (TWO) TIMES DAILY WITH A MEAL. 180 tablet 3  . MICROLET LANCETS MISC USE TO MONITOR GLUCOSE LEVELS TWICE DAILY 100 each 1  . Misc Natural Products (HERBAL ENERGY COMPLEX PO) Take by mouth 2 (two) times daily with a meal.    . Multiple Vitamins-Minerals (WOMENS MULTIVITAMIN PO) Take by mouth daily.    . naproxen (NAPROSYN) 500 MG tablet Take 1 tablet (500 mg total) by mouth 2 (two) times daily with a meal. 20 tablet 0  . Omega-3 1000 MG CAPS Take 1 g by mouth daily.    Marland Kitchen  omeprazole (PRILOSEC) 20 MG capsule Take 1 capsule (20 mg total) by mouth daily. 90 capsule 1  . ONGLYZA 5 MG TABS tablet TAKE 1 TABLET BY MOUTH EVERY DAY 90 tablet 3  . vitamin B-12 (CYANOCOBALAMIN) 100 MCG tablet Take 100 mcg by mouth daily.    . vitamin C (ASCORBIC ACID) 500 MG tablet Take 1,000 mg by mouth daily.    . vitamin E 400 UNIT capsule Take 400 Units by mouth daily. Reported on 02/13/2016     No current facility-administered medications on file prior to visit.    Allergies  Allergen Reactions  . Penicillins Hives    Hives   . Tramadol Itching    Family History  Problem Relation Age of Onset  . Diabetes Father 39       Deceased  . Diabetes Mother        Living  . Hyperlipidemia Mother   . Tuberculosis Mother   . Asthma Mother   . Heart disease Maternal Uncle   . Colon cancer Maternal Uncle        dx in 69's  . Diabetes Brother   . Heart disease Maternal Aunt   . Arthritis Other        Maternal Aunts & Uncles  . Diabetes Sister   . Kidney failure Brother 30       Diseased  . Healthy Son        x1  . Breast cancer Cousin 13  . Esophageal cancer Neg Hx   . Rectal cancer Neg Hx   . Stomach cancer Neg Hx     Social History   Socioeconomic History  . Marital status: Single    Spouse name: Not on file  . Number of children: Not on file  . Years of education: Not on file  . Highest education level: Not on file  Occupational History  . Not on file  Tobacco Use  . Smoking status: Former Smoker    Packs/day: 0.25    Years: 20.00    Pack years: 5.00    Types: Cigarettes    Quit date: 12/09/2009    Years since quitting: 10.4  . Smokeless tobacco: Never Used  Substance and Sexual Activity  . Alcohol use: Not Currently    Alcohol/week: 0.0 standard drinks    Comment: social  . Drug use: No  . Sexual activity: Not Currently  Other Topics Concern  . Not on file  Social History Narrative  . Not on file   Social Determinants of Health   Financial  Resource Strain:   . Difficulty of Paying Living Expenses:   Food Insecurity:   .  Worried About Charity fundraiser in the Last Year:   . Arboriculturist in the Last Year:   Transportation Needs:   . Film/video editor (Medical):   Marland Kitchen Lack of Transportation (Non-Medical):   Physical Activity:   . Days of Exercise per Week:   . Minutes of Exercise per Session:   Stress:   . Feeling of Stress :   Social Connections:   . Frequency of Communication with Friends and Family:   . Frequency of Social Gatherings with Friends and Family:   . Attends Religious Services:   . Active Member of Clubs or Organizations:   . Attends Archivist Meetings:   Marland Kitchen Marital Status:    Review of Systems - See HPI.  All other ROS are negative.  Resp 16   Ht 5\' 6"  (1.676 m)   Wt 227 lb (103 kg)   BMI 36.64 kg/m   Physical Exam Vitals reviewed.  Constitutional:      Appearance: Normal appearance.  Musculoskeletal:     Right shoulder: No swelling, deformity, tenderness or bony tenderness. Decreased range of motion (Secondary to pain.  Noted with external rotation, abduction and flexion.). Normal strength.     Comments: + empty can test.   Neurological:     Mental Status: She is alert.       Recent Results (from the past 2160 hour(s))  Basic metabolic panel     Status: Abnormal   Collection Time: 02/03/20 11:01 AM  Result Value Ref Range   Glucose 203 (H) 65 - 99 mg/dL   BUN 21 8 - 27 mg/dL   Creatinine, Ser 1.01 (H) 0.57 - 1.00 mg/dL   GFR calc non Af Amer 61 >59 mL/min/1.73   GFR calc Af Amer 70 >59 mL/min/1.73   BUN/Creatinine Ratio 21 12 - 28   Sodium 142 134 - 144 mmol/L   Potassium 4.6 3.5 - 5.2 mmol/L   Chloride 104 96 - 106 mmol/L   CO2 23 20 - 29 mmol/L   Calcium 10.1 8.7 - 10.3 mg/dL  Magnesium     Status: None   Collection Time: 02/03/20 11:01 AM  Result Value Ref Range   Magnesium 1.9 1.6 - 2.3 mg/dL  Comprehensive metabolic panel     Status: Abnormal    Collection Time: 04/08/20  1:42 PM  Result Value Ref Range   Sodium 137 135 - 145 mEq/L   Potassium 4.2 3.5 - 5.1 mEq/L   Chloride 103 96 - 112 mEq/L   CO2 24 19 - 32 mEq/L   Glucose, Bld 71 70 - 99 mg/dL   BUN 23 6 - 23 mg/dL   Creatinine, Ser 0.76 0.40 - 1.20 mg/dL   Total Bilirubin 0.5 0.2 - 1.2 mg/dL   Alkaline Phosphatase 125 (H) 39 - 117 U/L   AST 47 (H) 0 - 37 U/L   ALT 40 (H) 0 - 35 U/L   Total Protein 7.3 6.0 - 8.3 g/dL   Albumin 4.4 3.5 - 5.2 g/dL   GFR 93.77 >60.00 mL/min   Calcium 10.0 8.4 - 10.5 mg/dL  CBC with Differential/Platelet     Status: Abnormal   Collection Time: 04/08/20  1:42 PM  Result Value Ref Range   WBC 7.9 4.0 - 10.5 K/uL   RBC 4.47 3.87 - 5.11 Mil/uL   Hemoglobin 13.2 12.0 - 15.0 g/dL   HCT 39.8 36.0 - 46.0 %   MCV 89.0 78.0 - 100.0 fl  MCHC 33.0 30.0 - 36.0 g/dL   RDW 14.6 11.5 - 15.5 %   Platelets 300.0 150.0 - 400.0 K/uL   Neutrophils Relative % 81.8 (H) 43.0 - 77.0 %   Lymphocytes Relative 10.2 (L) 12.0 - 46.0 %   Monocytes Relative 7.6 3.0 - 12.0 %   Eosinophils Relative 0.3 0.0 - 5.0 %   Basophils Relative 0.1 0.0 - 3.0 %   Neutro Abs 6.5 1.4 - 7.7 K/uL   Lymphs Abs 0.8 0.7 - 4.0 K/uL   Monocytes Absolute 0.6 0.1 - 1.0 K/uL   Eosinophils Absolute 0.0 0.0 - 0.7 K/uL   Basophils Absolute 0.0 0.0 - 0.1 K/uL  Lipid panel     Status: None   Collection Time: 04/08/20  1:42 PM  Result Value Ref Range   Cholesterol 179 0 - 200 mg/dL    Comment: ATP III Classification       Desirable:  < 200 mg/dL               Borderline High:  200 - 239 mg/dL          High:  > = 240 mg/dL   Triglycerides 61.0 0.0 - 149.0 mg/dL    Comment: Normal:  <150 mg/dLBorderline High:  150 - 199 mg/dL   HDL 77.60 >39.00 mg/dL   VLDL 12.2 0.0 - 40.0 mg/dL   LDL Cholesterol 89 0 - 99 mg/dL   Total CHOL/HDL Ratio 2     Comment:                Men          Women1/2 Average Risk     3.4          3.3Average Risk          5.0          4.42X Average Risk          9.6           7.13X Average Risk          15.0          11.0                       NonHDL 101.37     Comment: NOTE:  Non-HDL goal should be 30 mg/dL higher than patient's LDL goal (i.e. LDL goal of < 70 mg/dL, would have non-HDL goal of < 100 mg/dL)  TSH     Status: None   Collection Time: 04/08/20  1:42 PM  Result Value Ref Range   TSH 0.65 0.35 - 4.50 uIU/mL  Urine Microalbumin w/creat. ratio     Status: None   Collection Time: 04/08/20  1:57 PM  Result Value Ref Range   Creatinine, Urine 94 20 - 275 mg/dL   Microalb, Ur 0.8 mg/dL    Comment: Reference Range Not established    Microalb Creat Ratio 9 <30 mcg/mg creat    Comment: . The ADA defines abnormalities in albumin excretion as follows: Marland Kitchen Category         Result (mcg/mg creatinine) . Normal                    <30 Microalbuminuria         30-299  Clinical albuminuria   > OR = 300 . The ADA recommends that at least two of three specimens collected within a 3-6 month period be abnormal before considering a patient to be  within a diagnostic category.   Hepatitis panel, acute     Status: None   Collection Time: 04/11/20  5:27 PM  Result Value Ref Range   Hep A IgM NON-REACTIVE NON-REACTI   Hepatitis B Surface Ag NON-REACTIVE NON-REACTI   Hep B C IgM NON-REACTIVE NON-REACTI   Hepatitis C Ab NON-REACTIVE NON-REACTI   SIGNAL TO CUT-OFF 0.02 <1.00    Comment: . HCV antibody was non-reactive. There is no laboratory  evidence of HCV infection. . In most cases, no further action is required. However, if recent HCV exposure is suspected, a test for HCV RNA (test code [phone]) is suggested. . For additional information please refer to http://education.[facility].com/faq/FAQ22v1 (This link is being provided for informational/ educational purposes only.) . Marland Kitchen For additional information, please refer to  http://education.[facility].com/faq/FAQ202  (This link is being provided for informational/ educational purposes  only.) .   POCT HgB A1C     Status: Abnormal   Collection Time: 04/22/20  9:33 AM  Result Value Ref Range   Hemoglobin A1C 7.3 (A) 4.0 - 5.6 %   HbA1c POC (<> result, manual entry)     HbA1c, POC (prediabetic range)     HbA1c, POC (controlled diabetic range)      Assessment/Plan: 1. Tendinopathy of right rotator cuff 2. History of rotator cuff tear Given poor response to conservative measures will refer her back to sports medicine for consideration of musculoskeletal ultrasound versus MRI and further management.  We will continue meloxicam and Flexeril for now.  OTC Voltaren gel recommended.  Avoid heavy lifting and overexertion. - Ambulatory referral to Sports Medicine  4. Elevated liver enzymes Noted incidentally on last lab work.  Patient without any alcohol consumption.  Denies use of Tylenol-containing products.  Repeat LFTs today. - Hepatic function panel   Leeanne Rio, PA-C

## 2020-05-04 ENCOUNTER — Other Ambulatory Visit (INDEPENDENT_AMBULATORY_CARE_PROVIDER_SITE_OTHER): Payer: PRIVATE HEALTH INSURANCE

## 2020-05-04 DIAGNOSIS — R748 Abnormal levels of other serum enzymes: Secondary | ICD-10-CM | POA: Diagnosis not present

## 2020-05-04 LAB — VITAMIN D 25 HYDROXY (VIT D DEFICIENCY, FRACTURES): VITD: 12.06 ng/mL — ABNORMAL LOW (ref 30.00–100.00)

## 2020-05-04 LAB — GAMMA GT: GGT: 55 U/L — ABNORMAL HIGH (ref 7–51)

## 2020-05-06 ENCOUNTER — Other Ambulatory Visit: Payer: Self-pay | Admitting: Emergency Medicine

## 2020-05-06 ENCOUNTER — Other Ambulatory Visit: Payer: Self-pay | Admitting: Physician Assistant

## 2020-05-06 DIAGNOSIS — R748 Abnormal levels of other serum enzymes: Secondary | ICD-10-CM

## 2020-05-06 DIAGNOSIS — E559 Vitamin D deficiency, unspecified: Secondary | ICD-10-CM

## 2020-05-06 DIAGNOSIS — M67911 Unspecified disorder of synovium and tendon, right shoulder: Secondary | ICD-10-CM

## 2020-05-06 MED ORDER — VITAMIN D (ERGOCALCIFEROL) 1.25 MG (50000 UNIT) PO CAPS: ORAL_CAPSULE | ORAL | 3 refills | Status: DC

## 2020-05-11 ENCOUNTER — Telehealth: Payer: Self-pay | Admitting: Cardiology

## 2020-05-11 ENCOUNTER — Telehealth: Payer: Self-pay | Admitting: *Deleted

## 2020-05-11 NOTE — Telephone Encounter (Signed)
-----   Message from Gita Kudo, RN sent at 05/11/2020  9:42 AM EDT ----- Please schedule this patient for a split night sleep study per Dr. Berniece Salines.    For fatigue and daytime somnolence the patient will benefit from a sleep study to rule out obstructive sleep apnea.  Dr. Godfrey Pick Tobb.

## 2020-05-11 NOTE — Telephone Encounter (Signed)
Gae Bon with Sleep Studies is requesting a call back from Dr. Terrial Rhodes nurse in regards to a sleep study that was put in for this patient back in March. Please advise.

## 2020-05-11 NOTE — Telephone Encounter (Signed)
    Went to chart to check notes for sleep study ordered by DR. Tobb. Transferred call to [facility]

## 2020-05-11 NOTE — Telephone Encounter (Signed)
I spoke with Gae Bon and took care of this patients sleep study referral issues. No other issues or concerns were noted at this time. Gae Bon stated that she had everything she needed at this point and would get the patient scheduled as soon as the insurance authorization was done.

## 2020-05-12 ENCOUNTER — Encounter: Payer: Self-pay | Admitting: Family Medicine

## 2020-05-12 ENCOUNTER — Other Ambulatory Visit: Payer: Self-pay

## 2020-05-12 ENCOUNTER — Ambulatory Visit (INDEPENDENT_AMBULATORY_CARE_PROVIDER_SITE_OTHER): Payer: PRIVATE HEALTH INSURANCE

## 2020-05-12 ENCOUNTER — Ambulatory Visit (INDEPENDENT_AMBULATORY_CARE_PROVIDER_SITE_OTHER): Payer: PRIVATE HEALTH INSURANCE | Admitting: Family Medicine

## 2020-05-12 ENCOUNTER — Ambulatory Visit: Payer: Self-pay

## 2020-05-12 VITALS — BP 130/84 | HR 79 | Ht 66.0 in | Wt 232.0 lb

## 2020-05-12 DIAGNOSIS — G8929 Other chronic pain: Secondary | ICD-10-CM

## 2020-05-12 DIAGNOSIS — M25511 Pain in right shoulder: Secondary | ICD-10-CM

## 2020-05-12 MED ORDER — NITROGLYCERIN 0.2 MG/HR TD PT24
MEDICATED_PATCH | TRANSDERMAL | 1 refills | Status: DC
Start: 2020-05-12 — End: 2020-08-01

## 2020-05-12 NOTE — Progress Notes (Signed)
Subjective:    I'm seeing this patient as a consultation for:  Raiford Noble, PA-C. Note will be routed back to referring provider/PCP.  CC: R shoulder pain  I, Molly Weber, LAT, ATC, am serving as scribe for Dr. Lynne Leader.  HPI: Pt is a 59 y/o female presenting w/ c/o chronic R shoulder pain.  She has a hx of prior R RTC tear in 2017 that was treated conservatively.  More recently, she reports increased shoulder pain since April after making a trip to [place].  She feels that she may have re-irritated it from lifting suitcases.  She locates her pain to her R ant/sup shoulder w/ pain radiating into her R lateral neck and R upper arm  Radiating pain: R lateral upper arm and R lateral neck R shoulder mechanical symptoms: No Aggravating factors: attempts ar R shoulder AROM above 90 deg; laying on her R side Treatments tried: Meloxicam;  Diagnostic imaging: R shoulder XR-11/08/16; R shoulder MRI- 11/15/16  Past medical history, Surgical history, Family history, Social history, Allergies, and medications have been entered into the medical record, reviewed.   Review of Systems: No new headache, visual changes, nausea, vomiting, diarrhea, constipation, dizziness, abdominal pain, skin rash, fevers, chills, night sweats, weight loss, swollen lymph nodes, body aches, joint swelling, muscle aches, chest pain, shortness of breath, mood changes, visual or auditory hallucinations.   Objective:    Vitals:   05/12/20 1032  BP: 130/84  Pulse: 79  SpO2: 97%   General: Well Developed, well nourished, and in no acute distress.  Neuro/Psych: Alert and oriented x3, extra-ocular muscles intact, able to move all 4 extremities, sensation grossly intact. Skin: Warm and dry, no rashes noted.  Respiratory: Not using accessory muscles, speaking in full sentences, trachea midline.  Cardiovascular: Pulses palpable, no extremity edema. Abdomen: Does not appear distended. MSK:  C-spine  normal-appearing Decreased cervical motion to right rotation and lateral flexion otherwise normal. Nontender midline.  Tender palpation right trapezius. Right shoulder normal-appearing Tender palpation trapezius otherwise nontender. Range of motion abduction diminished 100 degrees active and full passive. External rotation 30 degrees beyond neutral position. Internal rotation to iliac crest. Strength intact however pain with abduction and internal rotation. Positive Hawkins and Neer's test. Positive empty can test. Negative Yergason's and speeds test.  Lab and Radiology Results X-ray images right shoulder obtained today personally and independently reviewed High riding humeral head.  Mild shoulder degeneration.  No acute fractures. Await formal radiology review  Diagnostic Limited MSK Ultrasound of: Right shoulder Biceps tendon normal-appearing intact in bicipital groove. Subscapularis tendon normal-appearing Supraspinatus tendon with hypoechoic change near distal tendon insertion consistent with old partial tear. Tendon fibers are intact without significant retraction however. Increased subacromial bursa thickness present. Infraspinatus tendon intact normal-appearing AC joint largely normal-appearing with minor effusion. Impression: Subacromial bursitis and evidence of supraspinatus partial-thickness tear likely old  EXAM: MRI OF THE RIGHT SHOULDER WITHOUT CONTRAST  TECHNIQUE: Multiplanar, multisequence MR imaging of the shoulder was performed. No intravenous contrast was administered.  COMPARISON:  Radiographs dated 11/08/2016  FINDINGS: Rotator cuff: There is a full-thickness retracted 2.5 x 3 cm tear of the supraspinous tendon. The other tendons of the rotator cuff are intact.  Muscles: There is atrophy of the supraspinous, infraspinatus and teres minor muscles. Subscapularis muscle appears normal.  Biceps long head:  Properly located and  intact.  Acromioclavicular Joint:  Normal.  Type 2 acromion.  Glenohumeral Joint: Minimal joint effusion. Diffuse thinning of the articular cartilage.  Labrum:  Intact.  Bones:  No significant abnormalities.  Other: None  IMPRESSION: Large full-thickness retracted tear of the supraspinous tendon. Moderate atrophy of the supraspinous, infraspinatus and teres minor muscles.   Electronically Signed   By: Lorriane Shire M.D.   On: 11/16/2016 08:35 I, Lynne Leader, personally (independently) visualized and performed the interpretation of the images attached in this note.   Impression and Recommendations:    Assessment and Plan: 59 y.o. female with right shoulder pain ongoing since about April.  Patient has history of old rotator cuff tear which likely is contributing to this.  It is hard to tell the changes I saw on ultrasound today how old they are however the bursitis likely is contributing to her pain which is probably new.  Discussed options.  Plan for nitroglycerin patch protocol and physical therapy.  Check back in 6 weeks.  Return sooner if needed.Marland Kitchen  PDMP not reviewed this encounter. Orders Placed This Encounter  Procedures  . [place] LIMITED JOINT SPACE STRUCTURES UP RIGHT(NO LINKED CHARGES)    Order Specific Question:   Reason for Exam (SYMPTOM  OR DIAGNOSIS REQUIRED)    Answer:   R shoulder pain    Order Specific Question:   Preferred imaging location?    Answer:   [facility]  . DG Shoulder Right    Standing Status:   Future    Standing Expiration Date:   05/12/2021    Order Specific Question:   Reason for Exam (SYMPTOM  OR DIAGNOSIS REQUIRED)    Answer:   eval shoulder pain    Order Specific Question:   Is patient pregnant?    Answer:   No    Order Specific Question:   Preferred imaging location?    Answer:   [facility]    Order Specific Question:   Radiology Contrast Protocol - do NOT remove file path    Answer:    [email or web address]  . Ambulatory referral to Physical Therapy    Referral Priority:   Routine    Referral Type:   Physical Medicine    Referral Reason:   Specialty Services Required    Requested Specialty:   Physical Therapy   Meds ordered this encounter  Medications  . nitroGLYCERIN (NITRODUR - DOSED IN MG/24 HR) 0.2 mg/hr patch    Sig: Apply 1/4 patch daily to tendon for tendonitis.    Dispense:  30 patch    Refill:  1    Discussed warning signs or symptoms. Please see discharge instructions. Patient expresses understanding.   The above documentation has been reviewed and is accurate and complete Lynne Leader, M.D.

## 2020-05-12 NOTE — Patient Instructions (Addendum)
Thank you for coming in today. Plan for PT.  Let me know if you have a problem.  Use the nitropatches.   Nitroglycerin Protocol   Apply 1/4 nitroglycerin patch to affected area daily.  Change position of patch within the affected area every 24 hours.  You may experience a headache during the first 1-2 weeks of using the patch, these should subside.  If you experience headaches after beginning nitroglycerin patch treatment, you may take your preferred over the counter pain reliever.  Another side effect of the nitroglycerin patch is skin irritation or rash related to patch adhesive.  Please notify our office if you develop more severe headaches or rash, and stop the patch.  Tendon healing with nitroglycerin patch may require 12 to 24 weeks depending on the extent of injury.  Men should not use if taking Viagra, Cialis, or Levitra.   Do not use if you have migraines or rosacea.   Recheck in 6 weeks.  Return or contact me sooner if not doing well.

## 2020-05-13 NOTE — Progress Notes (Signed)
Mild shoulder arthritis present.  Evidence of prior rotator cuff injury visible on x-ray.

## 2020-05-20 ENCOUNTER — Telehealth: Payer: Self-pay | Admitting: *Deleted

## 2020-05-20 NOTE — Telephone Encounter (Signed)
-----   Message from Tye Savoy sent at 05/20/2020  9:20 AM EDT ----- No pre-cert required per Santiago Glad, ref #[id number]  ----- Message ----- From: Lauralee Evener, CMA Sent: 05/19/2020   7:31 PM EDT To: Tye Savoy  Split night CPT [id number]  DX: R53.83  R40.0    Dr Harriet Masson ----- Message ----- From: Gita Kudo, RN Sent: 05/11/2020   9:42 AM EDT To: Freada Bergeron, CMA, [facility]  Please schedule this patient for a split night sleep study per Dr. Godfrey Pick Tobb.    For fatigue and daytime somnolence the patient will benefit from a sleep study to rule out obstructive sleep apnea.  Dr. Godfrey Pick Tobb.

## 2020-05-20 NOTE — Telephone Encounter (Signed)
This encounter was created in error - please disregard.

## 2020-05-20 NOTE — Addendum Note (Signed)
Addended by: Freada Bergeron on: 05/20/2020 06:00 PM   Modules accepted: Level of Service, SmartSet

## 2020-05-21 ENCOUNTER — Other Ambulatory Visit: Payer: Self-pay | Admitting: Endocrinology

## 2020-05-23 ENCOUNTER — Other Ambulatory Visit: Payer: Self-pay

## 2020-05-23 ENCOUNTER — Encounter: Payer: Self-pay | Admitting: Physical Therapy

## 2020-05-23 ENCOUNTER — Ambulatory Visit: Payer: PRIVATE HEALTH INSURANCE | Attending: Family Medicine | Admitting: Physical Therapy

## 2020-05-23 DIAGNOSIS — G8929 Other chronic pain: Secondary | ICD-10-CM

## 2020-05-23 DIAGNOSIS — M6281 Muscle weakness (generalized): Secondary | ICD-10-CM | POA: Diagnosis present

## 2020-05-23 DIAGNOSIS — M25511 Pain in right shoulder: Secondary | ICD-10-CM | POA: Diagnosis not present

## 2020-05-23 DIAGNOSIS — R252 Cramp and spasm: Secondary | ICD-10-CM | POA: Diagnosis present

## 2020-05-23 NOTE — Patient Instructions (Signed)
Access Code: [email or web address] URL: [email or web address] Date: 05/23/2020 Prepared by: Amador Cunas  Exercises Seated Shoulder Flexion Towel Slide at Table Top - 1 x daily - 7 x weekly - 10 reps - 3 sets Seated Shoulder Abduction Towel Slide at Table Top - 1 x daily - 7 x weekly - 10 reps - 3 sets Seated Scapular Retraction - 1 x daily - 7 x weekly - 10 reps - 3 sets Isometric Shoulder External Rotation at Wall - 1 x daily - 7 x weekly - 3 sets - 10 reps - 3 sec hold Standing Isometric Shoulder Internal Rotation at Doorway - 1 x daily - 7 x weekly - 3 sets - 10 reps - 3 sec hold Isometric Shoulder Flexion at Wall - 1 x daily - 7 x weekly - 3 sets - 10 reps - 3 sec hold Isometric Shoulder Abduction at Wall - 1 x daily - 7 x weekly - 3 sets - 10 reps - 3 sec hold Isometric Shoulder Extension at Wall - 1 x daily - 7 x weekly - 3 sets - 10 reps - 3 sec hold

## 2020-05-23 NOTE — Therapy (Signed)
[facility] [address] [place], [place], [address] Phone: [phone]   Fax:  [phone]  Physical Therapy Evaluation  Patient Details  Name: [name] MRN: [id number] Date of Birth: 1961-07-29 Referring Provider (PT): Lynne Leader   Encounter Date: 05/23/2020   PT End of Session - 05/23/20 1633    Visit Number 1    Date for PT Re-Evaluation 07/23/20    PT Start Time 8828    PT Stop Time 1611    PT Time Calculation (min) 41 min    Activity Tolerance Patient tolerated treatment well;Patient limited by pain    Behavior During Therapy [facility] for tasks assessed/performed           Past Medical History:  Diagnosis Date  . Anemia   . Arthritis    bilateral knees  . Breast cancer screening 12/20/2015  . Bronchitis    hx of  . Carpal tunnel syndrome 12/29/2015  . Cervical radiculopathy 11/28/2015  . Diabetes mellitus without complication ([place]) 0/02/4916  . Diastolic dysfunction 91/50/5697  . Essential hypertension 06/13/2016  . Fibroid tumor    Had partial hysterectomy  . Gallstones   . Gastroesophageal reflux disease without esophagitis 03/11/2018  . Lesion of ulnar nerve 12/29/2015  . Migraine    hx of  . Obesity (BMI 30-39.9) 11/04/2019  . Obesity (BMI 30.0-34.9) 03/31/2019  . Primary osteoarthritis of both knees 10/10/2014  . Trigger finger, acquired 10/10/2014  . Uncontrolled type 2 diabetes mellitus with hyperglycemia ([place]) 04/15/2018  . Visit for preventive health examination 12/20/2015    Past Surgical History:  Procedure Laterality Date  . ABDOMINAL HYSTERECTOMY     partial   . boil removed     . CARPAL TUNNEL RELEASE  04-15-15  . CHOLECYSTECTOMY  Oct 2012  . ELBOW SURGERY  04-15-15  . HAND SURGERY  04-15-15  . Nerve damage  04-15-15  . TOTAL KNEE ARTHROPLASTY  08/01/2012   Procedure: TOTAL KNEE ARTHROPLASTY;  Surgeon: Augustin Schooling, MD;  Location: [place];  Service: Orthopedics;  Laterality: Right;  RIGHT  TOTAL KNEE ARTHROPLASTY  . TRIGGER FINGER RELEASE  04-15-15  . TUBAL LIGATION    . WISDOM TOOTH EXTRACTION      There were no vitals filed for this visit.    Subjective Assessment - 05/23/20 1531    Subjective Pt reports R shoulder pain beginning after a trip to [place] where she was lifting a lot of bags/suitcases in 03/2020. Shoulder got very achy and painful. Pt has hx of R RC tear which was treated conservatively. Pt has recent imaging showing possible rotator cuff pathology. Pt reports she is having trouble lifting objects, sleeping, reaching behind head/back.    Pertinent History anemia, diabetes, arthritis    Limitations Lifting;House hold activities    Diagnostic tests MRI    Patient Stated Goals get rid of pain and being able to use arm; avoid surgery    Currently in Pain? Yes    Pain Score 7     Pain Location Shoulder    Pain Orientation Right    Pain Descriptors / Indicators Aching   "like a toothache"   Pain Type Chronic pain    Pain Radiating Towards radiates into bicep but not further    Pain Onset More than a month ago    Pain Frequency Intermittent    Aggravating Factors  lifting, reaching, reaching behind back    Pain Relieving Factors pain meds, rest,  patch              [facility] PT Assessment - 05/23/20 0001      Assessment   Medical Diagnosis R RC tear    Referring Provider (PT) Lynne Leader    Hand Dominance Right    Next MD Visit 06/23/2020    Prior Therapy PT for TKA      Precautions   Precautions None      Restrictions   Weight Bearing Restrictions No      Balance Screen   Has the patient fallen in the past 6 months No    Has the patient had a decrease in activity level because of a fear of falling?  No    Is the patient reluctant to leave their home because of a fear of falling?  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      Sensation   Light Touch Appears Intact      ROM / Strength   AROM / PROM / Strength AROM;PROM;Strength      AROM   Overall AROM Comments cervical AROM WFL; pain with cervical rotation and lat flexion: functional R shoulder ER unable to reach behind head; R shoulder IR to L5 and painful    AROM Assessment Site Shoulder    Right/Left Shoulder Right;Left    Right Shoulder Flexion 85 Degrees    Right Shoulder ABduction 60 Degrees    Right Shoulder Internal Rotation 90 Degrees    Right Shoulder External Rotation 40 Degrees    Left Shoulder Flexion 180 Degrees    Left Shoulder ABduction 120 Degrees      PROM   PROM Assessment Site Shoulder    Right/Left Shoulder Right    Right Shoulder Flexion 120 Degrees    Right Shoulder ABduction 70 Degrees    Right Shoulder Internal Rotation 90 Degrees    Right Shoulder External Rotation 65 Degrees      Strength   Overall Strength Comments R UE strength 4-/5 globally; limited by pain      Palpation   Palpation comment very tender to palpation R anterior/superior shoulder and R UT      Special Tests    Special Tests --                      Objective measurements completed on examination: See above findings.       [facility] Adult PT Treatment/Exercise - 05/23/20 0001      Exercises   Exercises Shoulder      Shoulder Exercises: Isometric Strengthening   Other Isometric Exercises shoulder isometrics standing each direction x10, 3 sec hold      Shoulder Exercises: Stretch   Table Stretch - Flexion 5 reps;10 seconds    Table Stretch - Abduction 5 reps;10 seconds                  PT Education - 05/23/20 1618    Education Details Pt educated on POC and HEP    Person(s) Educated Patient    Methods Explanation;Demonstration;Handout    Comprehension Returned demonstration;Verbalized understanding            PT Short Term Goals - 05/23/20 1637      PT SHORT TERM GOAL #1   Title Pt will be  independent with HEP  Time 2    Period Weeks    Status New    Target Date 06/06/20             PT Long Term Goals - 05/23/20 1638      PT LONG TERM GOAL #1   Title Pt will demonstrate R shoulder flexion/abduction equivalent to L shoulder in order to return to functional ADLs.    Baseline AROM: flexion 85, abduction 60    Time 8    Period Weeks    Status New    Target Date 07/18/20      PT LONG TERM GOAL #2   Title Pt will demonstrate 5/5 R shoulder MMT    Baseline 4-/5 and painful    Time 8    Period Weeks    Status New    Target Date 07/18/20      PT LONG TERM GOAL #3   Title Pt will demonstrate functional R IR/ER equivalent to L in order to return to ADLs.    Time 8    Period Weeks    Status New    Target Date 07/18/20      PT LONG TERM GOAL #4   Title Pt will report ability to sleep through night without waking up d/t R shoulder pain    Time 8    Period Weeks    Status New    Target Date 07/18/20      PT LONG TERM GOAL #5   Title Pt will demonstrate ability to reach overhead and place 3# objects into shelf with no increase in R shoulder pain    Time 8    Period Weeks    Status New    Target Date 07/18/20                  Plan - 05/23/20 1634    Clinical Impression Statement Pt presents to clinic with reports of chronic R shoulder pain worsening since lifting bags/suitcases on trip to [place] in April 2021. Pt has hx of R RC tear which was treated conservatively; recent imaging shows potential RC pathology. Pt would like to treat conservatively. Pt demonstrates limited and painful R shoulder ROM actively and passively, tenderness to palpation ant/sup shoulder and UT, limited functional IR/ER, and weakness of R shoulder MMT. Pt reports difficulty performing ADLs d/t R shoulder pain. Pt would benefit from skilled PT to address the above impairments.    Personal Factors and Comorbidities Comorbidity 2    Comorbidities diabetes, arthritis     Examination-Activity Limitations Carry;Dressing;Lift;Reach Overhead    Examination-Participation Restrictions Meal Prep;Cleaning;Community Activity;Interpersonal Relationship    Stability/Clinical Decision Making Stable/Uncomplicated    Clinical Decision Making Low    Rehab Potential Good    PT Frequency 2x / week    PT Duration 8 weeks    PT Treatment/Interventions ADLs/Self Care Home Management;Cryotherapy;Electrical Stimulation;Ultrasound;Moist Heat;Iontophoresis 4mg /ml Dexamethasone;Functional mobility training;Therapeutic activities;Therapeutic exercise;Neuromuscular re-education;Manual techniques;Patient/family education;Passive range of motion;Vasopneumatic Device;Taping    PT Next Visit Plan review HEP, shoulder strengthening/flexibility, PROM and AAROM, manual/modalities as indicated    PT Home Exercise Plan shoulder isometrics, table flexion/abduction stretch    Consulted and Agree with Plan of Care Patient           Patient will benefit from skilled therapeutic intervention in order to improve the following deficits and impairments:  Decreased range of motion, Increased muscle spasms, Impaired UE functional use, Pain, Hypomobility, Decreased strength  Visit Diagnosis: Chronic right shoulder pain  Cramp and spasm  Muscle weakness (generalized)     Problem List Patient Active Problem List   Diagnosis Date Noted  . Diastolic dysfunction 47/20/7218  . Obesity (BMI 30-39.9) 11/04/2019  . Obesity (BMI 30.0-34.9) 03/31/2019  . Uncontrolled type 2 diabetes mellitus with hyperglycemia ([place]) 04/15/2018  . Gastroesophageal reflux disease without esophagitis 03/11/2018  . Need for pneumococcal vaccination 03/11/2018  . Essential hypertension 06/13/2016  . Diabetes mellitus without complication ([place]) 28/83/3744  . Carpal tunnel syndrome 12/29/2015  . Lesion of ulnar nerve 12/29/2015  . Visit for preventive health examination 12/20/2015  . Breast cancer screening 12/20/2015  .  Cervical radiculopathy 11/28/2015  . Primary osteoarthritis of both knees 10/10/2014  . Trigger finger, acquired 10/10/2014   Amador Cunas, PT, DPT Donald Prose Leslieanne Cobarrubias 05/23/2020, 4:41 PM  [place] [address] [place], [place], [address] Phone: [phone]   Fax:  [phone]  Name: [name] MRN: [id number] Date of Birth: 1961-03-29

## 2020-05-26 ENCOUNTER — Other Ambulatory Visit: Payer: Self-pay | Admitting: Physician Assistant

## 2020-05-26 DIAGNOSIS — K219 Gastro-esophageal reflux disease without esophagitis: Secondary | ICD-10-CM

## 2020-05-26 NOTE — Telephone Encounter (Signed)
Patient is scheduled for lab study on 06/20/20. pt is scheduled for COVID screening on 06/18/20 10:45 prior to ss..  Patient understands her sleep study will be done at [facility] sleep lab. Patient understands she will receive a sleep packet in a week or so. Patient understands to call if she does not receive the sleep packet in a timely manner. Patient agrees with treatment and thanked me for call.

## 2020-06-04 ENCOUNTER — Other Ambulatory Visit: Payer: Self-pay | Admitting: Endocrinology

## 2020-06-04 ENCOUNTER — Other Ambulatory Visit: Payer: Self-pay | Admitting: Physician Assistant

## 2020-06-06 ENCOUNTER — Ambulatory Visit: Payer: PRIVATE HEALTH INSURANCE | Admitting: Physical Therapy

## 2020-06-08 ENCOUNTER — Other Ambulatory Visit: Payer: Self-pay

## 2020-06-08 ENCOUNTER — Encounter: Payer: PRIVATE HEALTH INSURANCE | Admitting: Physical Therapy

## 2020-06-08 ENCOUNTER — Ambulatory Visit: Payer: PRIVATE HEALTH INSURANCE | Admitting: Physical Therapy

## 2020-06-08 ENCOUNTER — Encounter: Payer: Self-pay | Admitting: Physical Therapy

## 2020-06-08 DIAGNOSIS — R252 Cramp and spasm: Secondary | ICD-10-CM

## 2020-06-08 DIAGNOSIS — M6281 Muscle weakness (generalized): Secondary | ICD-10-CM

## 2020-06-08 DIAGNOSIS — M25511 Pain in right shoulder: Secondary | ICD-10-CM | POA: Diagnosis not present

## 2020-06-08 DIAGNOSIS — G8929 Other chronic pain: Secondary | ICD-10-CM

## 2020-06-08 NOTE — Therapy (Signed)
[place] [place], [place], [address] Phone: [phone]   Fax:  [phone]  Physical Therapy Treatment  Patient Details  Name: [name] MRN: [id number] Date of Birth: [birth year] Referring Provider (PT): Lynne Leader   Encounter Date: 06/08/2020   PT End of Session - 06/08/20 1553    Visit Number 2    Date for PT Re-Evaluation 07/23/20    PT Start Time 1513    PT Stop Time 1553    PT Time Calculation (min) 40 min    Activity Tolerance Patient tolerated treatment well    Behavior During Therapy [facility] for tasks assessed/performed           Past Medical History:  Diagnosis Date  . Anemia   . Arthritis    bilateral knees  . Breast cancer screening 12/20/2015  . Bronchitis    hx of  . Carpal tunnel syndrome 12/29/2015  . Cervical radiculopathy 11/28/2015  . Diabetes mellitus without complication ([place]) 12/11/5850  . Diastolic dysfunction 77/82/4235  . Essential hypertension 06/13/2016  . Fibroid tumor    Had partial hysterectomy  . Gallstones   . Gastroesophageal reflux disease without esophagitis 03/11/2018  . Lesion of ulnar nerve 12/29/2015  . Migraine    hx of  . Obesity (BMI 30-39.9) 11/04/2019  . Obesity (BMI 30.0-34.9) 03/31/2019  . Primary osteoarthritis of both knees 10/10/2014  . Trigger finger, acquired 10/10/2014  . Uncontrolled type 2 diabetes mellitus with hyperglycemia ([place]) 04/15/2018  . Visit for preventive health examination 12/20/2015    Past Surgical History:  Procedure Laterality Date  . ABDOMINAL HYSTERECTOMY     partial   . boil removed     . CARPAL TUNNEL RELEASE  04-15-15  . CHOLECYSTECTOMY  Oct 2012  . ELBOW SURGERY  04-15-15  . HAND SURGERY  04-15-15  . Nerve damage  04-15-15  . TOTAL KNEE ARTHROPLASTY  08/01/2012   Procedure: TOTAL KNEE ARTHROPLASTY;  Surgeon: Augustin Schooling, MD;  Location: [place];  Service: Orthopedics;  Laterality: Right;  RIGHT TOTAL KNEE ARTHROPLASTY   . TRIGGER FINGER RELEASE  04-15-15  . TUBAL LIGATION    . WISDOM TOOTH EXTRACTION      There were no vitals filed for this visit.   Subjective Assessment - 06/08/20 1512    Subjective Stiffness and nagging when she wakes up    Pertinent History anemia, diabetes, arthritis    Limitations Lifting;House hold activities    Currently in Pain? Yes    Pain Score 6     Pain Location Shoulder    Pain Orientation Right                             [facility] Adult PT Treatment/Exercise - 06/08/20 0001      Shoulder Exercises: Standing   Extension Weights;Strengthening;20 reps    Extension Weight (lbs) 5    Other Standing Exercises 3 level cabinet reaches flerx and abd x 10       Shoulder Exercises: ROM/Strengthening   UBE (Upper Arm Bike) L2 x 3 min each     Other ROM/Strengthening Exercises Rows & Lats 20lb 2x10       Shoulder Exercises: Stretch   Other Shoulder Stretches Hhomboid stretch 3x10''      Manual Therapy   Manual Therapy Passive ROM    Passive ROM R shoulder ROM  PT Short Term Goals - 05/23/20 1637      PT SHORT TERM GOAL #1   Title Pt will be independent with HEP    Time 2    Period Weeks    Status New    Target Date 06/06/20             PT Long Term Goals - 05/23/20 1638      PT LONG TERM GOAL #1   Title Pt will demonstrate R shoulder flexion/abduction equivalent to L shoulder in order to return to functional ADLs.    Baseline AROM: flexion 85, abduction 60    Time 8    Period Weeks    Status New    Target Date 07/18/20      PT LONG TERM GOAL #2   Title Pt will demonstrate 5/5 R shoulder MMT    Baseline 4-/5 and painful    Time 8    Period Weeks    Status New    Target Date 07/18/20      PT LONG TERM GOAL #3   Title Pt will demonstrate functional R IR/ER equivalent to L in order to return to ADLs.    Time 8    Period Weeks    Status New    Target Date 07/18/20      PT LONG TERM GOAL #4   Title  Pt will report ability to sleep through night without waking up d/t R shoulder pain    Time 8    Period Weeks    Status New    Target Date 07/18/20      PT LONG TERM GOAL #5   Title Pt will demonstrate ability to reach overhead and place 3# objects into shelf with no increase in R shoulder pain    Time 8    Period Weeks    Status New    Target Date 07/18/20                 Plan - 06/08/20 1554    Clinical Impression Statement Pt tolerated an initial progression to TE well. She did have a positive response to rhomboid stretch. Tactile cues needed for posture needed with seated rows and lats. Pt demo ed good strength with extensions. Limited with passive  flexion, external rotation, and abduction    Personal Factors and Comorbidities Comorbidity 2    Comorbidities diabetes, arthritis    Examination-Activity Limitations Carry;Dressing;Lift;Reach Overhead    Examination-Participation Restrictions Meal Prep;Cleaning;Community Activity;Interpersonal Relationship    Stability/Clinical Decision Making Stable/Uncomplicated    Rehab Potential Good    PT Frequency 2x / week    PT Duration 8 weeks    PT Treatment/Interventions ADLs/Self Care Home Management;Cryotherapy;Electrical Stimulation;Ultrasound;Moist Heat;Iontophoresis 4mg /ml Dexamethasone;Functional mobility training;Therapeutic activities;Therapeutic exercise;Neuromuscular re-education;Manual techniques;Patient/family education;Passive range of motion;Vasopneumatic Device;Taping    PT Next Visit Plan review HEP, shoulder strengthening/flexibility, PROM and AAROM, manual/modalities as indicated    PT Home Exercise Plan shoulder isometrics, table flexion/abduction stretch           Patient will benefit from skilled therapeutic intervention in order to improve the following deficits and impairments:  Decreased range of motion, Increased muscle spasms, Impaired UE functional use, Pain, Hypomobility, Decreased strength  Visit  Diagnosis: Cramp and spasm  Muscle weakness (generalized)  Chronic right shoulder pain     Problem List Patient Active Problem List   Diagnosis Date Noted  . Diastolic dysfunction 26/37/8588  . Obesity (BMI 30-39.9) 11/04/2019  . Obesity (BMI 30.0-34.9) 03/31/2019  .  Uncontrolled type 2 diabetes mellitus with hyperglycemia ([place]) 04/15/2018  . Gastroesophageal reflux disease without esophagitis 03/11/2018  . Need for pneumococcal vaccination 03/11/2018  . Essential hypertension 06/13/2016  . Diabetes mellitus without complication ([place]) 98/61/4830  . Carpal tunnel syndrome 12/29/2015  . Lesion of ulnar nerve 12/29/2015  . Visit for preventive health examination 12/20/2015  . Breast cancer screening 12/20/2015  . Cervical radiculopathy 11/28/2015  . Primary osteoarthritis of both knees 10/10/2014  . Trigger finger, acquired 10/10/2014    Scot Jun 06/08/2020, 3:59 PM  [address] [place], [place], [address] Phone: [phone]   Fax:  [phone]  Name: VIRDELL HOILAND MRN: [id number] Date of Birth: [birth year]

## 2020-06-15 ENCOUNTER — Ambulatory Visit: Payer: PRIVATE HEALTH INSURANCE | Attending: Family Medicine | Admitting: Physical Therapy

## 2020-06-15 ENCOUNTER — Encounter: Payer: Self-pay | Admitting: Physical Therapy

## 2020-06-15 ENCOUNTER — Other Ambulatory Visit: Payer: Self-pay

## 2020-06-15 DIAGNOSIS — G8929 Other chronic pain: Secondary | ICD-10-CM

## 2020-06-15 DIAGNOSIS — M6281 Muscle weakness (generalized): Secondary | ICD-10-CM | POA: Diagnosis present

## 2020-06-15 DIAGNOSIS — R252 Cramp and spasm: Secondary | ICD-10-CM | POA: Diagnosis present

## 2020-06-15 DIAGNOSIS — M25511 Pain in right shoulder: Secondary | ICD-10-CM | POA: Insufficient documentation

## 2020-06-15 NOTE — Therapy (Signed)
[facility] [address] [place], [place], [address] Phone: [phone]   Fax:  [phone]  Physical Therapy Treatment  Patient Details  Name: [name] MRN: [id number] Date of Birth: 1961-03-17 Referring Provider (PT): Lynne Leader   Encounter Date: 06/15/2020   PT End of Session - 06/15/20 1146    Visit Number 3    Date for PT Re-Evaluation 07/23/20    PT Start Time 1102    PT Stop Time 1145    PT Time Calculation (min) 43 min    Activity Tolerance Patient tolerated treatment well    Behavior During Therapy [facility] for tasks assessed/performed           Past Medical History:  Diagnosis Date  . Anemia   . Arthritis    bilateral knees  . Breast cancer screening 12/20/2015  . Bronchitis    hx of  . Carpal tunnel syndrome 12/29/2015  . Cervical radiculopathy 11/28/2015  . Diabetes mellitus without complication ([place]) 5/72/6203  . Diastolic dysfunction 55/97/4163  . Essential hypertension 06/13/2016  . Fibroid tumor    Had partial hysterectomy  . Gallstones   . Gastroesophageal reflux disease without esophagitis 03/11/2018  . Lesion of ulnar nerve 12/29/2015  . Migraine    hx of  . Obesity (BMI 30-39.9) 11/04/2019  . Obesity (BMI 30.0-34.9) 03/31/2019  . Primary osteoarthritis of both knees 10/10/2014  . Trigger finger, acquired 10/10/2014  . Uncontrolled type 2 diabetes mellitus with hyperglycemia ([place]) 04/15/2018  . Visit for preventive health examination 12/20/2015    Past Surgical History:  Procedure Laterality Date  . ABDOMINAL HYSTERECTOMY     partial   . boil removed     . CARPAL TUNNEL RELEASE  04-15-15  . CHOLECYSTECTOMY  Oct 2012  . ELBOW SURGERY  04-15-15  . HAND SURGERY  04-15-15  . Nerve damage  04-15-15  . TOTAL KNEE ARTHROPLASTY  08/01/2012   Procedure: TOTAL KNEE ARTHROPLASTY;  Surgeon: Augustin Schooling, MD;  Location: [place];  Service: Orthopedics;  Laterality: Right;  RIGHT TOTAL KNEE ARTHROPLASTY  .  TRIGGER FINGER RELEASE  04-15-15  . TUBAL LIGATION    . WISDOM TOOTH EXTRACTION      There were no vitals filed for this visit.   Subjective Assessment - 06/15/20 1101    Subjective states that shoulder pain comes and goes    Currently in Pain? Yes    Pain Score 6     Pain Location Shoulder    Pain Orientation Right    Pain Descriptors / Indicators Aching                             [facility] Adult PT Treatment/Exercise - 06/15/20 0001      Shoulder Exercises: Standing   Extension Weights;Strengthening;20 reps    Extension Weight (lbs) 5    Other Standing Exercises 3 level cabinet reaches flex and abd x 10  1#    Other Standing Exercises wall slide flex/abd RUE 2x10      Shoulder Exercises: ROM/Strengthening   UBE (Upper Arm Bike) L3 x 3 min each    Lat Pull Limitations 20# 2x10    Cybex Row Limitations 20# 2x10      Manual Therapy   Manual Therapy Passive ROM    Passive ROM R shoulder ROM  PT Short Term Goals - 05/23/20 1637      PT SHORT TERM GOAL #1   Title Pt will be independent with HEP    Time 2    Period Weeks    Status New    Target Date 06/06/20             PT Long Term Goals - 05/23/20 1638      PT LONG TERM GOAL #1   Title Pt will demonstrate R shoulder flexion/abduction equivalent to L shoulder in order to return to functional ADLs.    Baseline AROM: flexion 85, abduction 60    Time 8    Period Weeks    Status New    Target Date 07/18/20      PT LONG TERM GOAL #2   Title Pt will demonstrate 5/5 R shoulder MMT    Baseline 4-/5 and painful    Time 8    Period Weeks    Status New    Target Date 07/18/20      PT LONG TERM GOAL #3   Title Pt will demonstrate functional R IR/ER equivalent to L in order to return to ADLs.    Time 8    Period Weeks    Status New    Target Date 07/18/20      PT LONG TERM GOAL #4   Title Pt will report ability to sleep through night without waking up d/t R shoulder  pain    Time 8    Period Weeks    Status New    Target Date 07/18/20      PT LONG TERM GOAL #5   Title Pt will demonstrate ability to reach overhead and place 3# objects into shelf with no increase in R shoulder pain    Time 8    Period Weeks    Status New    Target Date 07/18/20                 Plan - 06/15/20 1146    Clinical Impression Statement Cues to avoid compensation with shoulder flexion and abduction ex's; pt complains of R shoulder pain with flexion. Pt demos improved PROM all directions this rx. Continue to push functional strength.    PT Treatment/Interventions ADLs/Self Care Home Management;Cryotherapy;Electrical Stimulation;Ultrasound;Moist Heat;Iontophoresis 4mg /ml Dexamethasone;Functional mobility training;Therapeutic activities;Therapeutic exercise;Neuromuscular re-education;Manual techniques;Patient/family education;Passive range of motion;Vasopneumatic Device;Taping    PT Next Visit Plan review HEP, shoulder strengthening/flexibility, PROM and AAROM, manual/modalities as indicated    Consulted and Agree with Plan of Care Patient           Patient will benefit from skilled therapeutic intervention in order to improve the following deficits and impairments:  Decreased range of motion, Increased muscle spasms, Impaired UE functional use, Pain, Hypomobility, Decreased strength  Visit Diagnosis: Cramp and spasm  Muscle weakness (generalized)  Chronic right shoulder pain     Problem List Patient Active Problem List   Diagnosis Date Noted  . Diastolic dysfunction 76/73/4193  . Obesity (BMI 30-39.9) 11/04/2019  . Obesity (BMI 30.0-34.9) 03/31/2019  . Uncontrolled type 2 diabetes mellitus with hyperglycemia ([place]) 04/15/2018  . Gastroesophageal reflux disease without esophagitis 03/11/2018  . Need for pneumococcal vaccination 03/11/2018  . Essential hypertension 06/13/2016  . Diabetes mellitus without complication ([place]) 79/01/4096  . Carpal tunnel  syndrome 12/29/2015  . Lesion of ulnar nerve 12/29/2015  . Visit for preventive health examination 12/20/2015  . Breast cancer screening 12/20/2015  . Cervical radiculopathy 11/28/2015  . Primary  osteoarthritis of both knees 10/10/2014  . Trigger finger, acquired 10/10/2014   Amador Cunas, PT, DPT Donald Prose Jhoselin Crume 06/15/2020, 11:49 AM  [facility] [address] [place], [place], [address] Phone: [phone]   Fax:  [phone]  Name: [name] MRN: [id number] Date of Birth: 1961-07-23

## 2020-06-17 ENCOUNTER — Ambulatory Visit: Payer: PRIVATE HEALTH INSURANCE | Admitting: Physical Therapy

## 2020-06-17 ENCOUNTER — Encounter: Payer: Self-pay | Admitting: Physical Therapy

## 2020-06-17 ENCOUNTER — Other Ambulatory Visit: Payer: Self-pay

## 2020-06-17 DIAGNOSIS — R252 Cramp and spasm: Secondary | ICD-10-CM | POA: Diagnosis not present

## 2020-06-17 DIAGNOSIS — G8929 Other chronic pain: Secondary | ICD-10-CM

## 2020-06-17 DIAGNOSIS — M6281 Muscle weakness (generalized): Secondary | ICD-10-CM

## 2020-06-17 NOTE — Therapy (Signed)
[address] [place], [place], [address] Phone: [phone]   Fax:  [phone]  Physical Therapy Treatment  Patient Details  Name: [name] MRN: [id number] Date of Birth: 1961-05-22 Referring Provider (PT): Lynne Leader   Encounter Date: 06/17/2020   PT End of Session - 06/17/20 1138    Visit Number 4    Date for PT Re-Evaluation 07/23/20    PT Start Time 1100    PT Stop Time 1140    PT Time Calculation (min) 40 min    Activity Tolerance Patient tolerated treatment well    Behavior During Therapy [facility] for tasks assessed/performed           Past Medical History:  Diagnosis Date  . Anemia   . Arthritis    bilateral knees  . Breast cancer screening 12/20/2015  . Bronchitis    hx of  . Carpal tunnel syndrome 12/29/2015  . Cervical radiculopathy 11/28/2015  . Diabetes mellitus without complication ([place]) 2/70/3500  . Diastolic dysfunction 93/81/8299  . Essential hypertension 06/13/2016  . Fibroid tumor    Had partial hysterectomy  . Gallstones   . Gastroesophageal reflux disease without esophagitis 03/11/2018  . Lesion of ulnar nerve 12/29/2015  . Migraine    hx of  . Obesity (BMI 30-39.9) 11/04/2019  . Obesity (BMI 30.0-34.9) 03/31/2019  . Primary osteoarthritis of both knees 10/10/2014  . Trigger finger, acquired 10/10/2014  . Uncontrolled type 2 diabetes mellitus with hyperglycemia ([place]) 04/15/2018  . Visit for preventive health examination 12/20/2015    Past Surgical History:  Procedure Laterality Date  . ABDOMINAL HYSTERECTOMY     partial   . boil removed     . CARPAL TUNNEL RELEASE  04-15-15  . CHOLECYSTECTOMY  Oct 2012  . ELBOW SURGERY  04-15-15  . HAND SURGERY  04-15-15  . Nerve damage  04-15-15  . TOTAL KNEE ARTHROPLASTY  08/01/2012   Procedure: TOTAL KNEE ARTHROPLASTY;  Surgeon: Augustin Schooling, MD;  Location: [place];  Service: Orthopedics;  Laterality: Right;  RIGHT TOTAL KNEE ARTHROPLASTY  .  TRIGGER FINGER RELEASE  04-15-15  . TUBAL LIGATION    . WISDOM TOOTH EXTRACTION      There were no vitals filed for this visit.   Subjective Assessment - 06/17/20 1104    Subjective Arm is a little sore today because I had to sleep in a chair    Pertinent History anemia, diabetes, arthritis    Currently in Pain? Yes    Pain Score 5     Pain Location Shoulder    Pain Orientation Right                             [facility] Adult PT Treatment/Exercise - 06/17/20 0001      Shoulder Exercises: Standing   External Rotation Strengthening;Right;Theraband;20 reps    Theraband Level (Shoulder External Rotation) Level 2 (Red)    Internal Rotation Theraband;20 reps;Right;Strengthening    Theraband Level (Shoulder Internal Rotation) Level 2 (Red)    Flexion Strengthening;Both;10 reps;Weights    Shoulder Flexion Weight (lbs) 1    ABduction Theraband;20 reps;Both;Strengthening;Weights    Shoulder ABduction Weight (lbs) 2    Other Standing Exercises Tricpts Ext 20lb 2x10       Shoulder Exercises: ROM/Strengthening   UBE (Upper Arm Bike) L3 x 3 min each    Lat Pull Limitations 20# 2x10  Cybex Row Limitations 20# 2x10      Manual Therapy   Manual Therapy Passive ROM    Passive ROM R shoulder ROM                     PT Short Term Goals - 05/23/20 1637      PT SHORT TERM GOAL #1   Title Pt will be independent with HEP    Time 2    Period Weeks    Status New    Target Date 06/06/20             PT Long Term Goals - 05/23/20 1638      PT LONG TERM GOAL #1   Title Pt will demonstrate R shoulder flexion/abduction equivalent to L shoulder in order to return to functional ADLs.    Baseline AROM: flexion 85, abduction 60    Time 8    Period Weeks    Status New    Target Date 07/18/20      PT LONG TERM GOAL #2   Title Pt will demonstrate 5/5 R shoulder MMT    Baseline 4-/5 and painful    Time 8    Period Weeks    Status New    Target Date 07/18/20       PT LONG TERM GOAL #3   Title Pt will demonstrate functional R IR/ER equivalent to L in order to return to ADLs.    Time 8    Period Weeks    Status New    Target Date 07/18/20      PT LONG TERM GOAL #4   Title Pt will report ability to sleep through night without waking up d/t R shoulder pain    Time 8    Period Weeks    Status New    Target Date 07/18/20      PT LONG TERM GOAL #5   Title Pt will demonstrate ability to reach overhead and place 3# objects into shelf with no increase in R shoulder pain    Time 8    Period Weeks    Status New    Target Date 07/18/20                 Plan - 06/17/20 1139    Clinical Impression Statement Pt enters clinic reporting increase pain today. This pain was increased with R shoulder flexion and external rotation. Her R shoulder PROM is good overall with some end range pain with most motions. This pain lessened as PROM progressed,    Personal Factors and Comorbidities Comorbidity 2    Comorbidities diabetes, arthritis    Examination-Activity Limitations Carry;Dressing;Lift;Reach Overhead    Examination-Participation Restrictions Meal Prep;Cleaning;Community Activity;Interpersonal Relationship    Stability/Clinical Decision Making Stable/Uncomplicated    Rehab Potential Good    PT Frequency 2x / week    PT Treatment/Interventions ADLs/Self Care Home Management;Cryotherapy;Electrical Stimulation;Ultrasound;Moist Heat;Iontophoresis 4mg /ml Dexamethasone;Functional mobility training;Therapeutic activities;Therapeutic exercise;Neuromuscular re-education;Manual techniques;Patient/family education;Passive range of motion;Vasopneumatic Device;Taping    PT Next Visit Plan shoulder strengthening/flexibility, PROM and AAROM, manual/modalities as indicated           Patient will benefit from skilled therapeutic intervention in order to improve the following deficits and impairments:  Decreased range of motion, Increased muscle spasms, Impaired UE  functional use, Pain, Hypomobility, Decreased strength  Visit Diagnosis: Chronic right shoulder pain  Muscle weakness (generalized)  Cramp and spasm     Problem List Patient Active Problem List   Diagnosis  Date Noted  . Diastolic dysfunction 41/74/0814  . Obesity (BMI 30-39.9) 11/04/2019  . Obesity (BMI 30.0-34.9) 03/31/2019  . Uncontrolled type 2 diabetes mellitus with hyperglycemia ([place]) 04/15/2018  . Gastroesophageal reflux disease without esophagitis 03/11/2018  . Need for pneumococcal vaccination 03/11/2018  . Essential hypertension 06/13/2016  . Diabetes mellitus without complication ([place]) 48/18/5631  . Carpal tunnel syndrome 12/29/2015  . Lesion of ulnar nerve 12/29/2015  . Visit for preventive health examination 12/20/2015  . Breast cancer screening 12/20/2015  . Cervical radiculopathy 11/28/2015  . Primary osteoarthritis of both knees 10/10/2014  . Trigger finger, acquired 10/10/2014    Scot Jun, PTA 06/17/2020, 11:42 AM  [address] [place], [place], [address] Phone: [phone]   Fax:  [phone]  Name: [name] MRN: [id number] Date of Birth: October 27, 1961

## 2020-06-18 ENCOUNTER — Other Ambulatory Visit (HOSPITAL_COMMUNITY): Payer: PRIVATE HEALTH INSURANCE

## 2020-06-18 ENCOUNTER — Other Ambulatory Visit: Payer: Self-pay | Admitting: Endocrinology

## 2020-06-18 DIAGNOSIS — E119 Type 2 diabetes mellitus without complications: Secondary | ICD-10-CM

## 2020-06-20 ENCOUNTER — Other Ambulatory Visit: Payer: Self-pay

## 2020-06-20 ENCOUNTER — Ambulatory Visit (HOSPITAL_BASED_OUTPATIENT_CLINIC_OR_DEPARTMENT_OTHER): Payer: PRIVATE HEALTH INSURANCE | Attending: Cardiology | Admitting: Cardiovascular Disease

## 2020-06-20 DIAGNOSIS — G4733 Obstructive sleep apnea (adult) (pediatric): Secondary | ICD-10-CM | POA: Diagnosis present

## 2020-06-20 DIAGNOSIS — I493 Ventricular premature depolarization: Secondary | ICD-10-CM | POA: Diagnosis not present

## 2020-06-20 DIAGNOSIS — Z7901 Long term (current) use of anticoagulants: Secondary | ICD-10-CM | POA: Diagnosis not present

## 2020-06-20 DIAGNOSIS — Z791 Long term (current) use of non-steroidal anti-inflammatories (NSAID): Secondary | ICD-10-CM | POA: Insufficient documentation

## 2020-06-20 DIAGNOSIS — Z79899 Other long term (current) drug therapy: Secondary | ICD-10-CM | POA: Diagnosis not present

## 2020-06-20 DIAGNOSIS — Z794 Long term (current) use of insulin: Secondary | ICD-10-CM | POA: Diagnosis not present

## 2020-06-20 DIAGNOSIS — R5383 Other fatigue: Secondary | ICD-10-CM | POA: Diagnosis not present

## 2020-06-21 ENCOUNTER — Ambulatory Visit: Payer: PRIVATE HEALTH INSURANCE | Admitting: Physical Therapy

## 2020-06-22 ENCOUNTER — Ambulatory Visit (INDEPENDENT_AMBULATORY_CARE_PROVIDER_SITE_OTHER): Payer: PRIVATE HEALTH INSURANCE | Admitting: Endocrinology

## 2020-06-22 ENCOUNTER — Other Ambulatory Visit: Payer: Self-pay

## 2020-06-22 ENCOUNTER — Encounter: Payer: Self-pay | Admitting: Endocrinology

## 2020-06-22 VITALS — BP 148/84 | HR 77 | Wt 223.6 lb

## 2020-06-22 DIAGNOSIS — E1165 Type 2 diabetes mellitus with hyperglycemia: Secondary | ICD-10-CM | POA: Diagnosis not present

## 2020-06-22 LAB — POCT GLYCOSYLATED HEMOGLOBIN (HGB A1C): Hemoglobin A1C: 6.2 % — AB (ref 4.0–5.6)

## 2020-06-22 MED ORDER — TRESIBA FLEXTOUCH 200 UNIT/ML ~~LOC~~ SOPN: 70.0000 [IU] | PEN_INJECTOR | Freq: Every day | SUBCUTANEOUS | 5 refills | Status: DC

## 2020-06-22 MED ORDER — CONTOUR NEXT TEST VI STRP: 1.0000 | ORAL_STRIP | Freq: Two times a day (BID) | 12 refills | Status: DC

## 2020-06-22 MED ORDER — BD PEN NEEDLE NANO 2ND GEN 32G X 4 MM MISC: 1.0000 | Freq: Every day | 3 refills | Status: DC

## 2020-06-22 NOTE — Progress Notes (Signed)
Subjective:    Patient ID: [name], female    DOB: [birth year], 59 y.o.   MRN: [id number]  HPI Pt returns for f/u of diabetes mellitus:  DM type: Insulin-requiring type 2.  Dx'ed: 2017, when she had a cbg of 400 on steroids.  Complications: none.  Therapy: insulin and 3 oral meds.   GDM: never.   DKA: never.  Severe hypoglycemia: never.  Pancreatitis: never.    SDOH: she is on qd insulin, due to f/o noncompliance.   Interval history:  Pt says cbg's vary from 54-157.  It is in general higher as the day goes on.  pt states she feels well in general.  Past Medical History:  Diagnosis Date  . Anemia   . Arthritis    bilateral knees  . Breast cancer screening 12/20/2015  . Bronchitis    hx of  . Carpal tunnel syndrome 12/29/2015  . Cervical radiculopathy 11/28/2015  . Diabetes mellitus without complication ([place]) 4/53/6468  . Diastolic dysfunction 02/28/2247  . Essential hypertension 06/13/2016  . Fibroid tumor    Had partial hysterectomy  . Gallstones   . Gastroesophageal reflux disease without esophagitis 03/11/2018  . Lesion of ulnar nerve 12/29/2015  . Migraine    hx of  . Obesity (BMI 30-39.9) 11/04/2019  . Obesity (BMI 30.0-34.9) 03/31/2019  . Primary osteoarthritis of both knees 10/10/2014  . Trigger finger, acquired 10/10/2014  . Uncontrolled type 2 diabetes mellitus with hyperglycemia ([place]) 04/15/2018  . Visit for preventive health examination 12/20/2015    Past Surgical History:  Procedure Laterality Date  . ABDOMINAL HYSTERECTOMY     partial   . boil removed     . CARPAL TUNNEL RELEASE  04-15-15  . CHOLECYSTECTOMY  Oct 2012  . ELBOW SURGERY  04-15-15  . HAND SURGERY  04-15-15  . Nerve damage  04-15-15  . TOTAL KNEE ARTHROPLASTY  08/01/2012   Procedure: TOTAL KNEE ARTHROPLASTY;  Surgeon: Augustin Schooling, MD;  Location: [facility];  Service: Orthopedics;  Laterality: Right;  RIGHT TOTAL KNEE ARTHROPLASTY  . TRIGGER FINGER RELEASE  04-15-15  . TUBAL LIGATION    . WISDOM TOOTH  EXTRACTION      Social History   Socioeconomic History  . Marital status: Single    Spouse name: Not on file  . Number of children: Not on file  . Years of education: Not on file  . Highest education level: Not on file  Occupational History  . Not on file  Tobacco Use  . Smoking status: Former Smoker    Packs/day: 0.25    Years: 20.00    Pack years: 5.00    Types: Cigarettes    Quit date: 12/09/2009    Years since quitting: 10.5  . Smokeless tobacco: Never Used  Vaping Use  . Vaping Use: Never used  Substance and Sexual Activity  . Alcohol use: Not Currently    Alcohol/week: 0.0 standard drinks    Comment: social  . Drug use: No  . Sexual activity: Not Currently  Other Topics Concern  . Not on file  Social History Narrative  . Not on file   Social Determinants of Health   Financial Resource Strain:   . Difficulty of Paying Living Expenses:   Food Insecurity:   . Worried About Charity fundraiser in the Last Year:   . Arboriculturist in the Last Year:   Transportation Needs:   . Film/video editor (Medical):   Marland Kitchen Lack  of Transportation (Non-Medical):   Physical Activity:   . Days of Exercise per Week:   . Minutes of Exercise per Session:   Stress:   . Feeling of Stress :   Social Connections:   . Frequency of Communication with Friends and Family:   . Frequency of Social Gatherings with Friends and Family:   . Attends Religious Services:   . Active Member of Clubs or Organizations:   . Attends Archivist Meetings:   Marland Kitchen Marital Status:   Intimate Partner Violence:   . Fear of Current or Ex-Partner:   . Emotionally Abused:   Marland Kitchen Physically Abused:   . Sexually Abused:     Current Outpatient Medications on File Prior to Visit  Medication Sig Dispense Refill  . atorvastatin (LIPITOR) 10 MG tablet TAKE 1 TABLET BY MOUTH EVERY DAY 90 tablet 1  . azelastine (ASTELIN) 0.1 % nasal spray Place 2 sprays into both nostrils 2 (two) times daily. Use in  each nostril as directed 30 mL 12  . BAYER CONTOUR TEST test strip USE TWICE DAILY AS INSTRUCTED TO CHECK SUGARS. DX E. 11.9 100 each 5  . BLACK COHOSH EXTRACT PO Take by mouth.    . celecoxib (CELEBREX) 100 MG capsule TAKE 1 CAPSULE (100 MG TOTAL) BY MOUTH 2 (TWO) TIMES DAILY. TO USE INSTEAD OF MELOXICAM (Patient not taking: Reported on 06/23/2020) 60 capsule 0  . Cinnamon 500 MG capsule Take 1,000 mg by mouth 2 (two) times daily.    . cyclobenzaprine (FLEXERIL) 10 MG tablet TAKE 1 TABLET BY MOUTH EVERYDAY AT BEDTIME 30 tablet 1  . Dulaglutide (TRULICITY) 6.04 VW/0.9WJ SOPN Inject 0.75 mg into the skin once a week. 4 pen 11  . Echinacea-Goldenseal (ECHINACEA COMB/GOLDEN SEAL PO) Take by mouth.    . empagliflozin (JARDIANCE) 25 MG TABS tablet Take 25 mg by mouth daily. 90 tablet 3  . fluocinonide cream (LIDEX) 1.91 % Apply 1 application topically 2 (two) times daily.    . fluticasone (FLONASE) 50 MCG/ACT nasal spray Place 2 sprays into both nostrils daily. 16 g 2  . furosemide (LASIX) 20 MG tablet Take 1 tablet (20 mg total) by mouth daily. 90 tablet 1  . gabapentin (NEURONTIN) 300 MG capsule TAKE 1 CAPSULE 3 TIMES A   DAY 270 capsule 1  . GLUCOSAMINE PO Take 1 tablet by mouth 2 (two) times daily.    Marland Kitchen levocetirizine (XYZAL) 5 MG tablet TAKE 1 TABLET BY MOUTH EVERY DAY IN THE EVENING 90 tablet 1  . losartan (COZAAR) 25 MG tablet TAKE 1 TABLET BY MOUTH EVERY DAY 90 tablet 2  . meloxicam (MOBIC) 15 MG tablet TAKE 1 TABLET BY MOUTH EVERY DAY 30 tablet 2  . metFORMIN (GLUCOPHAGE) 500 MG tablet TAKE 1 TABLET TWICE DAILY  WITH MEALS 180 tablet 3  . MICROLET LANCETS MISC USE TO MONITOR GLUCOSE LEVELS TWICE DAILY 100 each 1  . Misc Natural Products (HERBAL ENERGY COMPLEX PO) Take by mouth 2 (two) times daily with a meal.    . Multiple Vitamins-Minerals (WOMENS MULTIVITAMIN PO) Take by mouth daily.    . naproxen (NAPROSYN) 500 MG tablet Take 1 tablet (500 mg total) by mouth 2 (two) times daily with a  meal. (Patient not taking: Reported on 06/23/2020) 20 tablet 0  . nitroGLYCERIN (NITRODUR - DOSED IN MG/24 HR) 0.2 mg/hr patch Apply 1/4 patch daily to tendon for tendonitis. 30 patch 1  . Omega-3 1000 MG CAPS Take 1 g by mouth daily.    Marland Kitchen  omeprazole (PRILOSEC) 20 MG capsule TAKE 1 CAPSULE DAILY 90 capsule 1  . ONGLYZA 5 MG TABS tablet TAKE 1 TABLET BY MOUTH EVERY DAY 90 tablet 4  . vitamin B-12 (CYANOCOBALAMIN) 100 MCG tablet Take 100 mcg by mouth daily.    . vitamin C (ASCORBIC ACID) 500 MG tablet Take 1,000 mg by mouth daily.    . Vitamin D, Ergocalciferol, (DRISDOL) 1.25 MG (50000 UNIT) CAPS capsule Take 1 capsule by mouth once a week for 12 weeks 4 capsule 3  . vitamin E 400 UNIT capsule Take 400 Units by mouth daily. Reported on 02/13/2016     No current facility-administered medications on file prior to visit.    Allergies  Allergen Reactions  . Penicillins Hives    Hives   . Tramadol Itching    Family History  Problem Relation Age of Onset  . Diabetes Father 78       Deceased  . Diabetes Mother        Living  . Hyperlipidemia Mother   . Tuberculosis Mother   . Asthma Mother   . Heart disease Maternal Uncle   . Colon cancer Maternal Uncle        dx in 2's  . Diabetes Brother   . Heart disease Maternal Aunt   . Arthritis Other        Maternal Aunts & Uncles  . Diabetes Sister   . Kidney failure Brother 30       Diseased  . Healthy Son        x1  . Breast cancer Cousin 55  . Esophageal cancer Neg Hx   . Rectal cancer Neg Hx   . Stomach cancer Neg Hx     BP (!) 148/84 (BP Location: Left Arm, Patient Position: Sitting, Cuff Size: Large)   Pulse 77   Wt 223 lb 9.6 oz (101.4 kg)   SpO2 97%   BMI 36.09 kg/m    Review of Systems She denies hypoglycemia and nausea.      Objective:   Physical Exam VITAL SIGNS:  See vs page GENERAL: no distress Pulses: dorsalis pedis intact bilat.   MSK: no deformity of the feet CV: no leg edema Skin:  no ulcer on the feet.   normal color and temp on the feet. Neuro: sensation is intact to touch on the feet   Lab Results  Component Value Date   HGBA1C 6.2 (A) 06/22/2020   Lab Results  Component Value Date   CREATININE 0.76 04/08/2020   BUN 23 04/08/2020   NA 137 04/08/2020   K 4.2 04/08/2020   CL 103 04/08/2020   CO2 24 04/08/2020       Assessment & Plan:  Insulin-requiring type 2 DM.  Hypoglycemia, due to insulin: this limits aggressiveness of glycemic control.    Patient Instructions  Reduce the Tresiba to 70 units per day, and: Please continue the same other diabetes medications.   check your blood sugar twice a day.  vary the time of day when you check, between before the 3 meals, and at bedtime.  also check if you have symptoms of your blood sugar being too high or too low.  please keep a record of the readings and bring it to your next appointment here (or you can bring the meter itself).  You can write it on any piece of paper.  please call us sooner if your blood sugar goes below 70, or if you have a lot of readings over  200.  Please come back for a follow-up appointment in 2 months.

## 2020-06-22 NOTE — Patient Instructions (Addendum)
Reduce the Tresiba to 70 units per day, and: Please continue the same other diabetes medications.   check your blood sugar twice a day.  vary the time of day when you check, between before the 3 meals, and at bedtime.  also check if you have symptoms of your blood sugar being too high or too low.  please keep a record of the readings and bring it to your next appointment here (or you can bring the meter itself).  You can write it on any piece of paper.  please call us sooner if your blood sugar goes below 70, or if you have a lot of readings over 200.  Please come back for a follow-up appointment in 2 months.

## 2020-06-23 ENCOUNTER — Ambulatory Visit (INDEPENDENT_AMBULATORY_CARE_PROVIDER_SITE_OTHER): Payer: PRIVATE HEALTH INSURANCE | Admitting: Family Medicine

## 2020-06-23 ENCOUNTER — Encounter: Payer: Self-pay | Admitting: Family Medicine

## 2020-06-23 ENCOUNTER — Ambulatory Visit: Payer: PRIVATE HEALTH INSURANCE | Admitting: Physical Therapy

## 2020-06-23 ENCOUNTER — Ambulatory Visit: Payer: Self-pay

## 2020-06-23 VITALS — BP 124/78 | HR 80 | Ht 66.0 in | Wt 222.8 lb

## 2020-06-23 DIAGNOSIS — M25511 Pain in right shoulder: Secondary | ICD-10-CM

## 2020-06-23 DIAGNOSIS — G8929 Other chronic pain: Secondary | ICD-10-CM | POA: Diagnosis not present

## 2020-06-23 NOTE — Progress Notes (Signed)
   I, [name], LAT, ATC, am serving as scribe for Dr. Lynne Leader.  [name] is a 59 y.o. female who presents to [place] at [facility] today for f/u of R shoulder pain.  She was last seen by Dr. Georgina Snell on 05/12/20 and was prescribed nitroglycerin patches and referred to outpatient PT of which she has completed 3 visits.  Since her last visit w/ Dr. Georgina Snell, pt reports that her R shoulder feels a little better and notes that her pain comes/goes.  She has been doing her HEP as prescribed by PT.  She has been using her nitroglycerin patches.  She con't to have the most pain and difficulty w/ shoulder extension and lifting heavier things in front of her.  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   MSK: Right shoulder normal-appearing normal abduction range of motion. Strength abduction diminished 4/5.    Lab and Radiology Results  Diagnostic Limited MSK Ultrasound of: Right shoulder Steps tendon is intact in bicipital groove with mild increased hypoechoic fluid and tendon sheath. Subscapularis tendon is intact. Supraspinatus tendon area of hyperechoic change at distal tendon insertion indicating scar from previous tear however tendon fibers are present try interpretation. Infraspinatus tendon normal-appearing Impression: No worsening change in appearance ultrasound from last month.  Changes consistent with old supraspinatus tear.     Assessment and Plan: 59 y.o. female with improving symptoms from exacerbation of shoulder pain with 1 month of physical  therapy.  Plan to continue nitroglycerin patch protocol and PT.  If not better enough in 1 month patient will notify me we will proceed with MRI for surgical or injection planning at that point.  If pain and functionality sufficiently improved in a month no need for follow-up.    Orders Placed This Encounter  Procedures  . [place] LIMITED JOINT SPACE STRUCTURES UP RIGHT(NO LINKED CHARGES)    Order Specific Question:   Reason for Exam (SYMPTOM  OR DIAGNOSIS REQUIRED)    Answer:   R shoulder pain    Order Specific Question:   Preferred imaging location?    Answer:   [facility]   No orders of the defined types were placed in this encounter.    Discussed warning signs or symptoms. Please see discharge instructions. Patient expresses understanding.   The above documentation has been reviewed and is accurate and complete Lynne Leader, M.D.

## 2020-06-23 NOTE — Patient Instructions (Addendum)
Thank you for coming in today. Plan to continue PT.  If not better enough in 1 month let me know and I will order MRI.

## 2020-06-24 ENCOUNTER — Ambulatory Visit: Payer: PRIVATE HEALTH INSURANCE | Admitting: Physical Therapy

## 2020-06-24 ENCOUNTER — Other Ambulatory Visit: Payer: Self-pay

## 2020-06-24 DIAGNOSIS — R252 Cramp and spasm: Secondary | ICD-10-CM | POA: Diagnosis not present

## 2020-06-24 DIAGNOSIS — M6281 Muscle weakness (generalized): Secondary | ICD-10-CM

## 2020-06-24 DIAGNOSIS — G8929 Other chronic pain: Secondary | ICD-10-CM

## 2020-06-24 DIAGNOSIS — M25511 Pain in right shoulder: Secondary | ICD-10-CM

## 2020-06-24 NOTE — Therapy (Signed)
[address] [place], [place], [address] Phone: [phone]   Fax:  [phone]  Physical Therapy Treatment  Patient Details  Name: [name] MRN: [id number] Date of Birth: 1960/12/25 Referring Provider (PT): Lynne Leader   Encounter Date: 06/24/2020   PT End of Session - 06/24/20 0927    Visit Number 5    Date for PT Re-Evaluation 07/23/20    PT Start Time 0845    PT Stop Time 0940    PT Time Calculation (min) 55 min           Past Medical History:  Diagnosis Date  . Anemia   . Arthritis    bilateral knees  . Breast cancer screening 12/20/2015  . Bronchitis    hx of  . Carpal tunnel syndrome 12/29/2015  . Cervical radiculopathy 11/28/2015  . Diabetes mellitus without complication ([place]) 5/91/6384  . Diastolic dysfunction 66/59/9357  . Essential hypertension 06/13/2016  . Fibroid tumor    Had partial hysterectomy  . Gallstones   . Gastroesophageal reflux disease without esophagitis 03/11/2018  . Lesion of ulnar nerve 12/29/2015  . Migraine    hx of  . Obesity (BMI 30-39.9) 11/04/2019  . Obesity (BMI 30.0-34.9) 03/31/2019  . Primary osteoarthritis of both knees 10/10/2014  . Trigger finger, acquired 10/10/2014  . Uncontrolled type 2 diabetes mellitus with hyperglycemia ([place]) 04/15/2018  . Visit for preventive health examination 12/20/2015    Past Surgical History:  Procedure Laterality Date  . ABDOMINAL HYSTERECTOMY     partial   . boil removed     . CARPAL TUNNEL RELEASE  04-15-15  . CHOLECYSTECTOMY  Oct 2012  . ELBOW SURGERY  04-15-15  . HAND SURGERY  04-15-15  . Nerve damage  04-15-15  . TOTAL KNEE ARTHROPLASTY  08/01/2012   Procedure: TOTAL KNEE ARTHROPLASTY;  Surgeon: Augustin Schooling, MD;  Location: [facility];  Service: Orthopedics;  Laterality: Right;  RIGHT TOTAL KNEE ARTHROPLASTY  . TRIGGER FINGER RELEASE  04-15-15  . TUBAL LIGATION    . WISDOM TOOTH EXTRACTION      There were no vitals filed  for this visit.   Subjective Assessment - 06/24/20 0849    Subjective " here and there" when asked how shld was doing. pain varies with certain mvmt. saw MD yesterday and he said to continue PT    Currently in Pain? Yes    Pain Score 4               [facility] PT Assessment - 06/24/20 0001      AROM   AROM Assessment Site Shoulder    Right/Left Shoulder Right    Right Shoulder Flexion 150 Degrees    Right Shoulder ABduction 145 Degrees    Right Shoulder Internal Rotation 90 Degrees    Right Shoulder External Rotation 45 Degrees                         [facility] Adult PT Treatment/Exercise - 06/24/20 0001      Shoulder Exercises: Standing   External Rotation Strengthening;Right;Theraband;20 reps    Theraband Level (Shoulder External Rotation) Level 2 (Red)    Internal Rotation Theraband;20 reps;Right;Strengthening    Theraband Level (Shoulder Internal Rotation) Level 2 (Red)    Extension Strengthening;Both;20 reps;Weights    Theraband Level (Shoulder Extension) Level 2 (Red)    Other Standing Exercises red tband stab 3 way on wall 10 x  each      Shoulder Exercises: ROM/Strengthening   UBE (Upper Arm Bike) L3 3 fwd/3back    Lat Pull Limitations 20# 2x10    Cybex Row Limitations 20# 2x10      Modalities   Modalities Electrical Stimulation;Cryotherapy      Cryotherapy   Number Minutes Cryotherapy 15 Minutes    Cryotherapy Location Shoulder    Type of Cryotherapy Ice pack      Electrical Stimulation   Electrical Stimulation Location RT shld- post/lat    Electrical Stimulation Action IFC    Electrical Stimulation Parameters supine    Electrical Stimulation Goals Pain      Manual Therapy   Manual Therapy Passive ROM    Manual therapy comments good PROM in supine- pain at end range    Passive ROM R shoulder ROM                     PT Short Term Goals - 06/24/20 0931      PT SHORT TERM GOAL #1   Status Partially Met             PT Long Term  Goals - 06/24/20 0928      PT LONG TERM GOAL #1   Title Pt will demonstrate R shoulder flexion/abduction equivalent to L shoulder in order to return to functional ADLs.    Status Partially Met      PT LONG TERM GOAL #2   Title Pt will demonstrate 5/5 R shoulder MMT    Status On-going      PT LONG TERM GOAL #3   Title Pt will demonstrate functional R IR/ER equivalent to L in order to return to ADLs.    Status On-going      PT LONG TERM GOAL #4   Title Pt will report ability to sleep through night without waking up d/t R shoulder pain    Status On-going      PT LONG TERM GOAL #5   Title Pt will demonstrate ability to reach overhead and place 3# objects into shelf with no increase in R shoulder pain    Status On-going                 Plan - 06/24/20 0931    Clinical Impression Statement AROM of RT shld in stanidng has increased significantly throughout except ER still very limited and painful. PROM in supine is WFLS with pain at end range. Pt has pain and weakness with decreased ROM with muscle activation. pt is progressing towards goals.added estim with ice today to help with pain after TE and MT    PT Treatment/Interventions ADLs/Self Care Home Management;Cryotherapy;Electrical Stimulation;Ultrasound;Moist Heat;Iontophoresis 43m/ml Dexamethasone;Functional mobility training;Therapeutic activities;Therapeutic exercise;Neuromuscular re-education;Manual techniques;Patient/family education;Passive range of motion;Vasopneumatic Device;Taping    PT Next Visit Plan ADD HEP           Patient will benefit from skilled therapeutic intervention in order to improve the following deficits and impairments:  Decreased range of motion, Increased muscle spasms, Impaired UE functional use, Pain, Hypomobility, Decreased strength  Visit Diagnosis: Chronic right shoulder pain  Muscle weakness (generalized)     Problem List Patient Active Problem List   Diagnosis Date Noted  . Diastolic  dysfunction 108/14/4818 . Obesity (BMI 30-39.9) 11/04/2019  . Obesity (BMI 30.0-34.9) 03/31/2019  . Uncontrolled type 2 diabetes mellitus with hyperglycemia ([place] 04/15/2018  . Gastroesophageal reflux disease without esophagitis 03/11/2018  . Need for pneumococcal vaccination 03/11/2018  . Essential hypertension  06/13/2016  . Diabetes mellitus without complication ([place]) 72/25/7505  . Carpal tunnel syndrome 12/29/2015  . Lesion of ulnar nerve 12/29/2015  . Visit for preventive health examination 12/20/2015  . Breast cancer screening 12/20/2015  . Cervical radiculopathy 11/28/2015  . Primary osteoarthritis of both knees 10/10/2014  . Trigger finger, acquired 10/10/2014    Debbie Bellucci,ANGIE PTA 06/24/2020, 9:34 AM  [address] [place], [place], [address] Phone: [phone]   Fax:  [phone]  Name: [name] MRN: [id number] Date of Birth: 1961/01/15

## 2020-06-28 ENCOUNTER — Ambulatory Visit: Payer: PRIVATE HEALTH INSURANCE | Admitting: Physical Therapy

## 2020-06-29 ENCOUNTER — Other Ambulatory Visit: Payer: Self-pay

## 2020-06-29 ENCOUNTER — Ambulatory Visit: Payer: PRIVATE HEALTH INSURANCE | Admitting: Physical Therapy

## 2020-06-29 DIAGNOSIS — M6281 Muscle weakness (generalized): Secondary | ICD-10-CM

## 2020-06-29 DIAGNOSIS — R252 Cramp and spasm: Secondary | ICD-10-CM | POA: Diagnosis not present

## 2020-06-29 DIAGNOSIS — G8929 Other chronic pain: Secondary | ICD-10-CM

## 2020-06-29 NOTE — Patient Instructions (Signed)
Standing row with red tband Standing shoulder extension with red tband Standing shoulder external rotation with red tband Standing medial rotation with red tband Table external rotation stretch

## 2020-06-29 NOTE — Therapy (Signed)
[address] [place], [place], [address] Phone: [phone]   Fax:  [phone]  Physical Therapy Treatment  Patient Details  Name: [name] MRN: [id number] Date of Birth: Oct 26, 1961 Referring Provider (PT): Lynne Leader   Encounter Date: 06/29/2020   PT End of Session - 06/29/20 1027    Visit Number 6    Date for PT Re-Evaluation 07/23/20    PT Start Time 0845    PT Stop Time 0940    PT Time Calculation (min) 55 min           Past Medical History:  Diagnosis Date   Anemia    Arthritis    bilateral knees   Breast cancer screening 12/20/2015   Bronchitis    hx of   Carpal tunnel syndrome 12/29/2015   Cervical radiculopathy 11/28/2015   Diabetes mellitus without complication ([place]) 06/02/7627   Diastolic dysfunction 31/51/7616   Essential hypertension 06/13/2016   Fibroid tumor    Had partial hysterectomy   Gallstones    Gastroesophageal reflux disease without esophagitis 03/11/2018   Lesion of ulnar nerve 12/29/2015   Migraine    hx of   Obesity (BMI 30-39.9) 11/04/2019   Obesity (BMI 30.0-34.9) 03/31/2019   Primary osteoarthritis of both knees 10/10/2014   Trigger finger, acquired 10/10/2014   Uncontrolled type 2 diabetes mellitus with hyperglycemia ([place]) 04/15/2018   Visit for preventive health examination 12/20/2015    Past Surgical History:  Procedure Laterality Date   ABDOMINAL HYSTERECTOMY     partial    boil removed      CARPAL TUNNEL RELEASE  04-15-15   CHOLECYSTECTOMY  Oct 2012   ELBOW SURGERY  04-15-15   HAND SURGERY  04-15-15   Nerve damage  04-15-15   TOTAL KNEE ARTHROPLASTY  08/01/2012   Procedure: TOTAL KNEE ARTHROPLASTY;  Surgeon: Augustin Schooling, MD;  Location: [facility];  Service: Orthopedics;  Laterality: Right;  RIGHT TOTAL KNEE ARTHROPLASTY   TRIGGER FINGER RELEASE  04-15-15   TUBAL LIGATION     WISDOM TOOTH EXTRACTION      There were no vitals filed  for this visit.   Subjective Assessment - 06/29/20 0849    Subjective Patient reports that she is feeling sore today. She did not take medication and she woke up in pain last night from the way she was sleeping. She reports that this is not the norm.    Pain Score 6     Pain Location Shoulder    Pain Orientation Right;Posterior              [facility] PT Assessment - 06/29/20 0001      AROM   Right Shoulder Internal Rotation 65 Degrees    Right Shoulder External Rotation 46 Degrees      Strength   Overall Strength Deficits    Overall Strength Comments R UE strength 4-/5 ER & ABD with pain; WFL IR & flexion                         [facility] Adult PT Treatment/Exercise - 06/29/20 0001      Shoulder Exercises: Standing   External Rotation Strengthening;Right;20 reps;Theraband    Theraband Level (Shoulder External Rotation) Level 2 (Red)    Internal Rotation Theraband;20 reps;Right;Strengthening    Theraband Level (Shoulder Internal Rotation) Level 2 (Red)    Flexion --    Shoulder Flexion Weight (lbs) --  ABduction --    Shoulder ABduction Weight (lbs) --    Extension Strengthening;Both;20 reps;Weights    Theraband Level (Shoulder Extension) Level 2 (Red)    Row Strengthening;Both;20 reps;Theraband    Theraband Level (Shoulder Row) Level 2 (Red)    Other Standing Exercises 3 level cabinet reaches flexion 2# (with increasing pain), abd 3#      Shoulder Exercises: ROM/Strengthening   UBE (Upper Arm Bike) L3 3 fwd/3back                  PT Education - 06/29/20 1026    Education Details Patient given updated HEP    Person(s) Educated Patient    Methods Explanation;Demonstration    Comprehension Verbalized understanding;Returned demonstration            PT Short Term Goals - 06/24/20 0931      PT SHORT TERM GOAL #1   Status Partially Met             PT Long Term Goals - 06/29/20 1031      PT LONG TERM GOAL #2   Title Pt will demonstrate 5/5  R shoulder MMT    Baseline WFL except 4-/5 on LR and ABD and painful    Status On-going      PT LONG TERM GOAL #3   Title Pt will demonstrate functional R IR/ER equivalent to L in order to return to ADLs.    Baseline 65 IR/46 ER    Status On-going      PT LONG TERM GOAL #4   Title Pt will report ability to sleep through night without waking up d/t R shoulder pain    Baseline Patient reports normally being able to sleep through the night without pain, however, last night she woke up from pain.    Status On-going      PT LONG TERM GOAL #5   Title Pt will demonstrate ability to reach overhead and place 3# objects into shelf with no increase in R shoulder pain    Baseline Patient was able to reach with 2# without pain, however, progression of 3# caused increasing pain and had to stop.    Status On-going                 Plan - 06/29/20 1027    Clinical Impression Statement Patient progressing in overall shoulder strength, however, patient still presenting with increasing pain and limitations with ER. Patient given and demonstrated updated HEP. Patient was able to complete 3 level cabitnet reaches in abduction with no pain, however, had increasing pain with 3# with flexion.    Personal Factors and Comorbidities Comorbidity 2    Comorbidities diabetes, arthritis    Examination-Activity Limitations Carry;Dressing;Lift;Reach Overhead    Examination-Participation Restrictions Meal Prep;Cleaning;Community Activity;Interpersonal Relationship    Rehab Potential Good    PT Frequency 2x / week    PT Duration 8 weeks    PT Treatment/Interventions ADLs/Self Care Home Management;Cryotherapy;Electrical Stimulation;Ultrasound;Moist Heat;Iontophoresis 34m/ml Dexamethasone;Functional mobility training;Therapeutic activities;Therapeutic exercise;Neuromuscular re-education;Manual techniques;Patient/family education;Passive range of motion;Vasopneumatic Device;Taping    PT Next Visit Plan Progress  strength and ROM.    Consulted and Agree with Plan of Care Patient           Patient will benefit from skilled therapeutic intervention in order to improve the following deficits and impairments:  Decreased range of motion, Increased muscle spasms, Impaired UE functional use, Pain, Hypomobility, Decreased strength  Visit Diagnosis: Chronic right shoulder pain  Muscle weakness (generalized)  Cramp  and spasm     Problem List Patient Active Problem List   Diagnosis Date Noted   Diastolic dysfunction 41/74/0814   Obesity (BMI 30-39.9) 11/04/2019   Obesity (BMI 30.0-34.9) 03/31/2019   Uncontrolled type 2 diabetes mellitus with hyperglycemia ([place]) 04/15/2018   Gastroesophageal reflux disease without esophagitis 03/11/2018   Need for pneumococcal vaccination 03/11/2018   Essential hypertension 06/13/2016   Diabetes mellitus without complication ([place]) 48/18/5631   Carpal tunnel syndrome 12/29/2015   Lesion of ulnar nerve 12/29/2015   Visit for preventive health examination 12/20/2015   Breast cancer screening 12/20/2015   Cervical radiculopathy 11/28/2015   Primary osteoarthritis of both knees 10/10/2014   Trigger finger, acquired 10/10/2014    York Cerise, SPTA 06/29/2020, 10:38 AM  [address] [place], [place], [address] Phone: [phone]   Fax:  [phone]  Name: [name] MRN: [id number] Date of Birth: [birth year]

## 2020-06-30 ENCOUNTER — Ambulatory Visit: Payer: PRIVATE HEALTH INSURANCE | Admitting: Physical Therapy

## 2020-07-04 ENCOUNTER — Ambulatory Visit: Payer: PRIVATE HEALTH INSURANCE | Admitting: Physical Therapy

## 2020-07-04 ENCOUNTER — Encounter (HOSPITAL_BASED_OUTPATIENT_CLINIC_OR_DEPARTMENT_OTHER): Payer: Self-pay | Admitting: Cardiovascular Disease

## 2020-07-04 NOTE — Procedures (Signed)
Patient Name: [name], [name] Date: 06/20/2020 Gender: Female D.O.B: [birth year] Age (years): 54 Referring Provider: Godfrey Pick Tobb DO Height (inches): 66 Interpreting Physician: Shelva Majestic MD, ABSM Weight (lbs): 230 RPSGT: Carolin Coy BMI: 37 MRN: [id number] Neck Size: 15.50  CLINICAL INFORMATION Sleep Study Type: Split Night CPAP  Indication for sleep study: Diabetes, Hypertension, Obesity, Snoring  Epworth Sleepiness Score: 8  SLEEP STUDY TECHNIQUE As per the AASM Manual for the Scoring of Sleep and Associated Events v2.3 (April 2016) with a hypopnea requiring 4% desaturations.  The channels recorded and monitored were frontal, central and occipital EEG, electrooculogram (EOG), submentalis EMG (chin), nasal and oral airflow, thoracic and abdominal wall motion, anterior tibialis EMG, snore microphone, electrocardiogram, and pulse oximetry. Continuous positive airway pressure (CPAP) was initiated when the patient met split night criteria and was titrated according to treat sleep-disordered breathing.  MEDICATIONS atorvastatin (LIPITOR) 10 MG tablet azelastine (ASTELIN) 0.1 % nasal spray BAYER CONTOUR TEST test strip BLACK COHOSH EXTRACT PO celecoxib (CELEBREX) 100 MG capsule Cinnamon 500 MG capsule cyclobenzaprine (FLEXERIL) 10 MG tablet Dulaglutide (TRULICITY) 3.87 FI/4.3PI SOPN Echinacea-Goldenseal (ECHINACEA COMB/GOLDEN SEAL PO) empagliflozin (JARDIANCE) 25 MG TABS tablet fluocinonide cream (LIDEX) 0.05 % fluticasone (FLONASE) 50 MCG/ACT nasal spray furosemide (LASIX) 20 MG tablet gabapentin (NEURONTIN) 300 MG capsule GLUCOSAMINE PO glucose blood (CONTOUR NEXT TEST) test strip insulin degludec (TRESIBA FLEXTOUCH) 200 UNIT/ML FlexTouch Pen Insulin Pen Needle (BD PEN NEEDLE NANO 2ND GEN) 32G X 4 MM MISC levocetirizine (XYZAL) 5 MG tablet losartan (COZAAR) 25 MG tablet meloxicam (MOBIC) 15 MG tablet metFORMIN (GLUCOPHAGE) 500 MG tablet MICROLET LANCETS  MISC Misc Natural Products (HERBAL ENERGY COMPLEX PO) Multiple Vitamins-Minerals (WOMENS MULTIVITAMIN PO) naproxen (NAPROSYN) 500 MG tablet nitroGLYCERIN (NITRODUR - DOSED IN MG/24 HR) 0.2 mg/hr patch Omega-3 1000 MG CAPS omeprazole (PRILOSEC) 20 MG capsule ONGLYZA 5 MG TABS tablet vitamin B-12 (CYANOCOBALAMIN) 100 MCG tablet vitamin C (ASCORBIC ACID) 500 MG tablet Vitamin D, Ergocalciferol, (DRISDOL) 1.25 MG (50000 UNIT) CAPS capsule vitamin E 400 UNIT capsule  Medications self-administered by patient taken the night of the study : GABAPENTIN, MELOXICAM, METFORMIN HYDROCHLORIDE ER, tresiba flextouch, BLACK COHOSH  RESPIRATORY PARAMETERS Diagnostic Total AHI (/hr): 34.8 RDI (/hr): 37.5 OA Index (/hr): - CA Index (/hr): 0.4 REM AHI (/hr): 94.5 NREM AHI (/hr): 26.4 Supine AHI (/hr): N/A Non-supine AHI (/hr): 34.8 Min O2 Sat (%): 74.0 Mean O2 (%): 89.6 Time below 88% (min): 40   Titration Optimal Pressure (cm): 15 AHI at Optimal Pressure (/hr): 0.0 Min O2 at Optimal Pressure (%): 94.0 Supine % at Optimal (%): 100 Sleep % at Optimal (%): 100   SLEEP ARCHITECTURE The recording time for the entire night was 365.8 minutes.  During a baseline period of 196.2 minutes, the patient slept for 134.5 minutes in REM and nonREM, yielding a sleep efficiency of 68.5%%. Sleep onset after lights out was 57.9 minutes with a REM latency of 93.0 minutes. The patient spent 6.7%% of the night in stage N1 sleep, 81.0%% in stage N2 sleep, 0.0%% in stage N3 and 12.3% in REM.  During the titration period of 166.1 minutes, the patient slept for 161.4 minutes in REM and nonREM, yielding a sleep efficiency of 97.2%%. Sleep onset after CPAP initiation was 2.7 minutes with a REM latency of 65.5 minutes. The patient spent 3.4%% of the night in stage N1 sleep, 47.7%% in stage N2 sleep, 0.0%% in stage N3 and 48.9% in REM.  CARDIAC DATA The 2 lead EKG demonstrated sinus rhythm. The mean  heart rate was 100.0 beats per  minute. Other EKG findings include: PVCs.  LEG MOVEMENT DATA The total Periodic Limb Movements of Sleep (PLMS) were 0. The PLMS index was 0.0 .  IMPRESSIONS - Severe obstructive sleep apnea occurred during the diagnostic portion of the study (AHI 34.8/h; RDI 37.5/h); events were very severe during REM sleep (AHI 94.5/h). CPAP was initiated at 5 cm and was titrated to optimal PAP pressure at 15 cm of water. - No significant central sleep apnea occurred during the diagnostic portion of the study (CAI = 0.4/hour). - Severe oxygen desaturation was noted during the diagnostic portion of the study to a nadir of 74.0%. - The patient snored with moderate snoring volume during the diagnostic portion of the study. - EKG findings include PVCs. - Clinically significant periodic limb movements did not occur during sleep.  DIAGNOSIS - Obstructive Sleep Apnea (G47.33)  RECOMMENDATIONS - Recommend an initial trial of CPAP therapy with EPR at 15 cm H2O with a Medium size Resmed Full Face Mask AirFit F20 mask and heated humidification. - Effort should be made to optimize nasal and oropharyngeal patency. - Avoid alcohol, sedatives and other CNS depressants that may worsen sleep apnea and disrupt normal sleep architecture. - Sleep hygiene should be reviewed to assess factors that may improve sleep quality. - Weight management (BMI 37) and regular exercise should be initiated or continued. - Return to Sleep Center for re-evaluation after 4 weeks of therapy  [Electronically signed] 07/04/2020 04:26 PM  Shelva Majestic MD, [facility], ABSM Diplomate, American Board of Sleep Medicine   NPI: [id number] [place] PH: [phone]   FX: [phone] [place]

## 2020-07-06 ENCOUNTER — Ambulatory Visit: Payer: PRIVATE HEALTH INSURANCE | Admitting: Physical Therapy

## 2020-07-28 ENCOUNTER — Ambulatory Visit: Payer: PRIVATE HEALTH INSURANCE | Admitting: Family Medicine

## 2020-07-28 ENCOUNTER — Other Ambulatory Visit: Payer: Self-pay | Admitting: Physician Assistant

## 2020-07-28 DIAGNOSIS — E559 Vitamin D deficiency, unspecified: Secondary | ICD-10-CM

## 2020-07-30 ENCOUNTER — Other Ambulatory Visit: Payer: Self-pay | Admitting: Physician Assistant

## 2020-07-30 DIAGNOSIS — M5412 Radiculopathy, cervical region: Secondary | ICD-10-CM

## 2020-08-01 ENCOUNTER — Encounter: Payer: Self-pay | Admitting: Family Medicine

## 2020-08-01 ENCOUNTER — Ambulatory Visit: Payer: Self-pay

## 2020-08-01 ENCOUNTER — Other Ambulatory Visit: Payer: Self-pay

## 2020-08-01 ENCOUNTER — Ambulatory Visit (INDEPENDENT_AMBULATORY_CARE_PROVIDER_SITE_OTHER): Payer: PRIVATE HEALTH INSURANCE | Admitting: Family Medicine

## 2020-08-01 VITALS — BP 120/84 | HR 78 | Ht 66.0 in | Wt 227.4 lb

## 2020-08-01 DIAGNOSIS — G8929 Other chronic pain: Secondary | ICD-10-CM | POA: Diagnosis not present

## 2020-08-01 DIAGNOSIS — M25511 Pain in right shoulder: Secondary | ICD-10-CM

## 2020-08-01 MED ORDER — NITROGLYCERIN 0.2 MG/HR TD PT24: MEDICATED_PATCH | TRANSDERMAL | 1 refills | Status: DC

## 2020-08-01 NOTE — Patient Instructions (Signed)
Thank you for coming in today. Plan for continued home exercise.  Re-start the nitro patches.  If not good enough next steps are injection or MRI.  Let me know and recheck as needed.

## 2020-08-01 NOTE — Progress Notes (Signed)
   I, [name], LAT, ATC, am serving as scribe for Dr. Lynne Leader.  [name] is a 59 y.o. female who presents to [facility] today for f/u of R shoulder pain.  She was last seen by Dr. Georgina Wallace on 06/23/20 and was prescribed nitroglycerin patches.  She was also referred to PT of which she has completed 6 visits and has been shown a HEP.  Since her last visit w/ Dr. Georgina Wallace, pt reports that her R shoulder is a little better.  She has not been wearing her nitroglycerin patches due to losing them.  She has been doing her HEP but hasn't been able to go to PT the past few times.    Diagnostic testing: R shoulder XR- 05/12/20   Pertinent review of systems: No fevers or chills  Relevant historical information: Diabetes   Exam:  BP 120/84 (BP Location: Right Arm, Patient Position: Sitting, Cuff Size: Large)   Pulse 78   Ht 5\' 6"  (1.676 m)   Wt 227 lb 6.4 oz (103.1 kg)   SpO2 97%   BMI 36.70 kg/m  General: Well Developed, well nourished, and in no acute distress.   MSK: Right shoulder normal-appearing normal motion. Not particular tender palpation. Strength 4/5 external rotation otherwise normal. Mildly positive empty can test Hawkins and Neer's test. Negative Yergason's and speeds test.    Assessment and Plan: 59 y.o. female with right shoulder pain significantly improving with home exercise program physical therapy and very limited nitroglycerin patch protocol.  Patient has lost her nitro patches so we will refill them.  Continue home exercise program.  Discussed the next steps of either injection or MRI.  She would like to continue conservative management for now.  If not improved she will let me know and we will proceed with either MRI or injection.    Orders Placed This Encounter  Procedures  . [place] LIMITED JOINT SPACE STRUCTURES UP RIGHT(NO LINKED CHARGES)    Order Specific Question:   Reason for Exam (SYMPTOM  OR DIAGNOSIS REQUIRED)    Answer:    R shoulder pain    Order Specific Question:   Preferred imaging location?    Answer:   [facility]   Meds ordered this encounter  Medications  . nitroGLYCERIN (NITRODUR - DOSED IN MG/24 HR) 0.2 mg/hr patch    Sig: Apply 1/4 patch daily to tendon for tendonitis.    Dispense:  30 patch    Refill:  1     Discussed warning signs or symptoms. Please see discharge instructions. Patient expresses understanding.   The above documentation has been reviewed and is accurate and complete Lynne Leader, M.D.

## 2020-08-17 ENCOUNTER — Ambulatory Visit: Payer: PRIVATE HEALTH INSURANCE | Admitting: Endocrinology

## 2020-08-30 ENCOUNTER — Other Ambulatory Visit: Payer: Self-pay | Admitting: Physician Assistant

## 2020-08-30 DIAGNOSIS — E785 Hyperlipidemia, unspecified: Secondary | ICD-10-CM

## 2020-09-18 ENCOUNTER — Other Ambulatory Visit: Payer: Self-pay | Admitting: Physician Assistant

## 2020-09-18 DIAGNOSIS — M5412 Radiculopathy, cervical region: Secondary | ICD-10-CM

## 2020-09-18 DIAGNOSIS — E785 Hyperlipidemia, unspecified: Secondary | ICD-10-CM

## 2020-09-18 DIAGNOSIS — E559 Vitamin D deficiency, unspecified: Secondary | ICD-10-CM

## 2020-09-19 NOTE — Telephone Encounter (Signed)
Meloxicam 15 mg has been requested by pharm for cervical radiculopathy 30 1x daily with 2 refills. Last refiil 08/09/20.

## 2020-09-21 ENCOUNTER — Ambulatory Visit: Payer: PRIVATE HEALTH INSURANCE | Admitting: Endocrinology

## 2020-09-21 DIAGNOSIS — E1165 Type 2 diabetes mellitus with hyperglycemia: Secondary | ICD-10-CM

## 2020-10-09 ENCOUNTER — Other Ambulatory Visit: Payer: Self-pay | Admitting: Physician Assistant

## 2020-10-09 DIAGNOSIS — M5412 Radiculopathy, cervical region: Secondary | ICD-10-CM

## 2020-10-09 DIAGNOSIS — E559 Vitamin D deficiency, unspecified: Secondary | ICD-10-CM

## 2020-11-24 ENCOUNTER — Other Ambulatory Visit: Payer: Self-pay | Admitting: Physician Assistant

## 2020-11-24 DIAGNOSIS — E559 Vitamin D deficiency, unspecified: Secondary | ICD-10-CM

## 2020-12-11 ENCOUNTER — Other Ambulatory Visit: Payer: Self-pay | Admitting: Physician Assistant

## 2020-12-11 DIAGNOSIS — E559 Vitamin D deficiency, unspecified: Secondary | ICD-10-CM

## 2020-12-15 ENCOUNTER — Other Ambulatory Visit: Payer: Self-pay | Admitting: Physician Assistant

## 2020-12-15 DIAGNOSIS — K219 Gastro-esophageal reflux disease without esophagitis: Secondary | ICD-10-CM

## 2021-01-03 ENCOUNTER — Other Ambulatory Visit: Payer: Self-pay | Admitting: Cardiology

## 2021-01-03 NOTE — Telephone Encounter (Signed)
Refill sent to pharmacy.   

## 2021-01-18 ENCOUNTER — Other Ambulatory Visit: Payer: Self-pay | Admitting: Physician Assistant

## 2021-01-18 DIAGNOSIS — K219 Gastro-esophageal reflux disease without esophagitis: Secondary | ICD-10-CM

## 2021-02-10 ENCOUNTER — Other Ambulatory Visit: Payer: Self-pay

## 2021-02-10 ENCOUNTER — Ambulatory Visit (INDEPENDENT_AMBULATORY_CARE_PROVIDER_SITE_OTHER): Payer: PRIVATE HEALTH INSURANCE | Admitting: Endocrinology

## 2021-02-10 VITALS — BP 130/86 | HR 78 | Ht 66.0 in | Wt 221.6 lb

## 2021-02-10 DIAGNOSIS — E1165 Type 2 diabetes mellitus with hyperglycemia: Secondary | ICD-10-CM | POA: Diagnosis not present

## 2021-02-10 LAB — POCT GLYCOSYLATED HEMOGLOBIN (HGB A1C): Hemoglobin A1C: 8.7 % — AB (ref 4.0–5.6)

## 2021-02-10 MED ORDER — TRESIBA FLEXTOUCH 200 UNIT/ML ~~LOC~~ SOPN: 70.0000 [IU] | PEN_INJECTOR | Freq: Every day | SUBCUTANEOUS | Status: DC

## 2021-02-10 MED ORDER — METFORMIN HCL ER 500 MG PO TB24: 1000.0000 mg | ORAL_TABLET | Freq: Every day | ORAL | 3 refills | Status: DC

## 2021-02-10 MED ORDER — TRULICITY 1.5 MG/0.5ML ~~LOC~~ SOAJ: 1.5000 mg | SUBCUTANEOUS | 3 refills | Status: DC

## 2021-02-10 NOTE — Progress Notes (Signed)
Subjective:    Patient ID: [name], female    DOB: 1961/05/21, 60 y.o.   MRN: [id number]  HPI Pt returns for f/u of diabetes mellitus:  DM type: Insulin-requiring type 2.  Dx'ed: 2017, when she had a cbg of 400 on steroids.  Complications: none.  Therapy: insulin, Trulicity, and 3 oral meds.   GDM: never.   DKA: never.  Severe hypoglycemia: never.  Pancreatitis: never.    SDOH: she is on qd insulin, due to f/o noncompliance.   Interval history:  no cbg record, but states cbg's vary from 93-215.  It is in general higher as the day goes on.  pt states she feels well in general.  No recent steroids.  Pt says she is uncertain why G2R is higher. Pt says she seldom misses the insulin.   Past Medical History:  Diagnosis Date  . Anemia   . Arthritis    bilateral knees  . Breast cancer screening 12/20/2015  . Bronchitis    hx of  . Carpal tunnel syndrome 12/29/2015  . Cervical radiculopathy 11/28/2015  . Diabetes mellitus without complication ([facility]) 04/05/622  . Diastolic dysfunction 76/28/3151  . Essential hypertension 06/13/2016  . Fibroid tumor    Had partial hysterectomy  . Gallstones   . Gastroesophageal reflux disease without esophagitis 03/11/2018  . Lesion of ulnar nerve 12/29/2015  . Migraine    hx of  . Obesity (BMI 30-39.9) 11/04/2019  . Obesity (BMI 30.0-34.9) 03/31/2019  . Primary osteoarthritis of both knees 10/10/2014  . Trigger finger, acquired 10/10/2014  . Uncontrolled type 2 diabetes mellitus with hyperglycemia ([facility]) 04/15/2018  . Visit for preventive health examination 12/20/2015    Past Surgical History:  Procedure Laterality Date  . ABDOMINAL HYSTERECTOMY     partial   . boil removed     . CARPAL TUNNEL RELEASE  04-15-15  . CHOLECYSTECTOMY  Oct 2012  . ELBOW SURGERY  04-15-15  . HAND SURGERY  04-15-15  . Nerve damage  04-15-15  . TOTAL KNEE ARTHROPLASTY  08/01/2012   Procedure: TOTAL KNEE ARTHROPLASTY;  Surgeon: Augustin Schooling, MD;  Location: [facility];  Service:  Orthopedics;  Laterality: Right;  RIGHT TOTAL KNEE ARTHROPLASTY  . TRIGGER FINGER RELEASE  04-15-15  . TUBAL LIGATION    . WISDOM TOOTH EXTRACTION      Social History   Socioeconomic History  . Marital status: Single    Spouse name: Not on file  . Number of children: Not on file  . Years of education: Not on file  . Highest education level: Not on file  Occupational History  . Not on file  Tobacco Use  . Smoking status: Former Smoker    Packs/day: 0.25    Years: 20.00    Pack years: 5.00    Types: Cigarettes    Quit date: 12/09/2009    Years since quitting: 11.1  . Smokeless tobacco: Never Used  Vaping Use  . Vaping Use: Never used  Substance and Sexual Activity  . Alcohol use: Not Currently    Alcohol/week: 0.0 standard drinks    Comment: social  . Drug use: No  . Sexual activity: Not Currently  Other Topics Concern  . Not on file  Social History Narrative  . Not on file   Social Determinants of Health   Financial Resource Strain: Not on file  Food Insecurity: Not on file  Transportation Needs: Not on file  Physical Activity: Not on file  Stress: Not on  file  Social Connections: Not on file  Intimate Partner Violence: Not on file    Current Outpatient Medications on File Prior to Visit  Medication Sig Dispense Refill  . atorvastatin (LIPITOR) 10 MG tablet TAKE 1 TABLET BY MOUTH EVERY DAY 90 tablet 1  . azelastine (ASTELIN) 0.1 % nasal spray Place 2 sprays into both nostrils 2 (two) times daily. Use in each nostril as directed 30 mL 12  . BAYER CONTOUR TEST test strip USE TWICE DAILY AS INSTRUCTED TO CHECK SUGARS. DX E. 11.9 100 each 5  . BLACK COHOSH EXTRACT PO Take by mouth.    . Cinnamon 500 MG capsule Take 1,000 mg by mouth 2 (two) times daily.    . cyclobenzaprine (FLEXERIL) 10 MG tablet TAKE 1 TABLET BY MOUTH EVERYDAY AT BEDTIME 30 tablet 1  . Echinacea-Goldenseal (ECHINACEA COMB/GOLDEN SEAL PO) Take by mouth.    . empagliflozin (JARDIANCE) 25 MG TABS  tablet Take 25 mg by mouth daily. 90 tablet 3  . fluocinonide cream (LIDEX) 3.81 % Apply 1 application topically 2 (two) times daily.    . fluticasone (FLONASE) 50 MCG/ACT nasal spray Place 2 sprays into both nostrils daily. 16 g 2  . furosemide (LASIX) 20 MG tablet TAKE 1 TABLET BY MOUTH EVERY DAY 90 tablet 1  . gabapentin (NEURONTIN) 300 MG capsule TAKE 1 CAPSULE 3 TIMES A   DAY 270 capsule 1  . GLUCOSAMINE PO Take 1 tablet by mouth 2 (two) times daily.    Marland Kitchen glucose blood (CONTOUR NEXT TEST) test strip 1 each by Other route in the morning and at bedtime. And lancets 2/day 180 each 12  . Insulin Pen Needle (BD PEN NEEDLE NANO 2ND GEN) 32G X 4 MM MISC 1 each by Other route daily. 90 each 3  . levocetirizine (XYZAL) 5 MG tablet TAKE 1 TABLET EVERY EVENING 90 tablet 1  . losartan (COZAAR) 25 MG tablet TAKE 1 TABLET BY MOUTH EVERY DAY 90 tablet 2  . meloxicam (MOBIC) 15 MG tablet TAKE 1 TABLET BY MOUTH EVERY DAY 30 tablet 2  . MICROLET LANCETS MISC USE TO MONITOR GLUCOSE LEVELS TWICE DAILY 100 each 1  . Misc Natural Products (HERBAL ENERGY COMPLEX PO) Take by mouth 2 (two) times daily with a meal.    . Multiple Vitamins-Minerals (WOMENS MULTIVITAMIN PO) Take by mouth daily.    . naproxen (NAPROSYN) 500 MG tablet Take 1 tablet (500 mg total) by mouth 2 (two) times daily with a meal. 20 tablet 0  . nitroGLYCERIN (NITRODUR - DOSED IN MG/24 HR) 0.2 mg/hr patch Apply 1/4 patch daily to tendon for tendonitis. 30 patch 1  . Omega-3 1000 MG CAPS Take 1 g by mouth daily.    Marland Kitchen omeprazole (PRILOSEC) 20 MG capsule TAKE 1 CAPSULE DAILY 90 capsule 1  . vitamin B-12 (CYANOCOBALAMIN) 100 MCG tablet Take 100 mcg by mouth daily.    . vitamin C (ASCORBIC ACID) 500 MG tablet Take 1,000 mg by mouth daily.    . Vitamin D, Ergocalciferol, (DRISDOL) 1.25 MG (50000 UNIT) CAPS capsule Take 1 capsule by mouth once a week for 12 weeks 4 capsule 3  . vitamin E 400 UNIT capsule Take 400 Units by mouth daily. Reported on  02/13/2016     No current facility-administered medications on file prior to visit.    Allergies  Allergen Reactions  . Penicillins Hives    Hives   . Tramadol Itching    Family History  Problem Relation Age  of Onset  . Diabetes Father 44       Deceased  . Diabetes Mother        Living  . Hyperlipidemia Mother   . Tuberculosis Mother   . Asthma Mother   . Heart disease Maternal Uncle   . Colon cancer Maternal Uncle        dx in 31's  . Diabetes Brother   . Heart disease Maternal Aunt   . Arthritis Other        Maternal Aunts & Uncles  . Diabetes Sister   . Kidney failure Brother 30       Diseased  . Healthy Son        x1  . Breast cancer Cousin 63  . Esophageal cancer Neg Hx   . Rectal cancer Neg Hx   . Stomach cancer Neg Hx     BP 130/86 (BP Location: Right Arm, Patient Position: Sitting, Cuff Size: Large)   Pulse 78   Ht 5\' 6"  (1.676 m)   Wt 221 lb 9.6 oz (100.5 kg)   SpO2 96%   BMI 35.77 kg/m    Review of Systems She denies hypoglycemia/n/v.     Objective:   Physical Exam VITAL SIGNS:  See vs page GENERAL: no distress Pulses: dorsalis pedis intact bilat.   MSK: no deformity of the feet CV: no leg edema Skin:  no ulcer on the feet.  normal color and temp on the feet. Neuro: sensation is intact to touch on the feet  Lab Results  Component Value Date   CREATININE 0.76 04/08/2020   BUN 23 04/08/2020   NA 137 04/08/2020   K 4.2 04/08/2020   CL 103 04/08/2020   CO2 24 04/08/2020     Lab Results  Component Value Date   HGBA1C 8.7 (A) 02/10/2021       Assessment & Plan:  Insulin-requiring type 2 DM: uncontrolled.    Patient Instructions  I have sent a prescription to your pharmacy, to double the Trulicity, and: Please stop the Onglyza, and: Please continue the same other diabetes medications.   check your blood sugar twice a day.  vary the time of day when you check, between before the 3 meals, and at bedtime.  also check if you have  symptoms of your blood sugar being too high or too low.  please keep a record of the readings and bring it to your next appointment here (or you can bring the meter itself).  You can write it on any piece of paper.  please call us sooner if your blood sugar goes below 70, or if you have a lot of readings over 200.  Please come back for a follow-up appointment in 2 months.

## 2021-02-10 NOTE — Patient Instructions (Addendum)
I have sent a prescription to your pharmacy, to double the Trulicity, and: Please stop the Onglyza, and: Please continue the same other diabetes medications.   check your blood sugar twice a day.  vary the time of day when you check, between before the 3 meals, and at bedtime.  also check if you have symptoms of your blood sugar being too high or too low.  please keep a record of the readings and bring it to your next appointment here (or you can bring the meter itself).  You can write it on any piece of paper.  please call us sooner if your blood sugar goes below 70, or if you have a lot of readings over 200.  Please come back for a follow-up appointment in 2 months.

## 2021-02-19 ENCOUNTER — Other Ambulatory Visit: Payer: Self-pay | Admitting: Endocrinology

## 2021-02-20 ENCOUNTER — Telehealth: Payer: Self-pay | Admitting: Endocrinology

## 2021-02-20 NOTE — Telephone Encounter (Signed)
MEDICATION: tresiba   PHARMACY:   [id number] - [place], [place] - [address] AT [place] Phone:  [phone]  Fax:  [phone]     HAS THE PATIENT CONTACTED THEIR PHARMACY?  yes  IS THIS A 90 DAY SUPPLY : pt doesn't know  IS PATIENT OUT OF MEDICATION: not yet  IF NOT; HOW MUCH IS LEFT: 1  LAST APPOINTMENT DATE: @3 /13/2022  NEXT APPOINTMENT DATE:@5 /11/2021  DO WE HAVE YOUR PERMISSION TO LEAVE A DETAILED MESSAGE?:  OTHER COMMENTS:  Pharmacy told pt she needs a new prescription to be sent.  **Let patient know to contact pharmacy at the end of the day to make sure medication is ready. **  ** Please notify patient to allow 48-72 hours to process**  **Encourage patient to contact the pharmacy for refills or they can request refills through [facility]**

## 2021-02-23 ENCOUNTER — Other Ambulatory Visit: Payer: Self-pay

## 2021-02-23 MED ORDER — TRESIBA FLEXTOUCH 200 UNIT/ML ~~LOC~~ SOPN: 70.0000 [IU] | PEN_INJECTOR | Freq: Every day | SUBCUTANEOUS | 2 refills | Status: DC

## 2021-03-08 ENCOUNTER — Other Ambulatory Visit: Payer: Self-pay | Admitting: Physician Assistant

## 2021-03-08 ENCOUNTER — Other Ambulatory Visit: Payer: Self-pay | Admitting: Cardiology

## 2021-03-08 DIAGNOSIS — E785 Hyperlipidemia, unspecified: Secondary | ICD-10-CM

## 2021-03-08 NOTE — Telephone Encounter (Signed)
Refill sent to pharmacy.   

## 2021-03-09 ENCOUNTER — Other Ambulatory Visit: Payer: Self-pay

## 2021-03-09 ENCOUNTER — Encounter: Payer: Self-pay | Admitting: Nurse Practitioner

## 2021-03-09 ENCOUNTER — Ambulatory Visit (INDEPENDENT_AMBULATORY_CARE_PROVIDER_SITE_OTHER): Payer: PRIVATE HEALTH INSURANCE | Admitting: Nurse Practitioner

## 2021-03-09 ENCOUNTER — Other Ambulatory Visit: Payer: Self-pay | Admitting: Endocrinology

## 2021-03-09 ENCOUNTER — Other Ambulatory Visit: Payer: Self-pay | Admitting: Family Medicine

## 2021-03-09 VITALS — BP 130/78 | HR 82 | Temp 97.4°F | Ht 65.25 in | Wt 224.6 lb

## 2021-03-09 DIAGNOSIS — M170 Bilateral primary osteoarthritis of knee: Secondary | ICD-10-CM

## 2021-03-09 DIAGNOSIS — Z794 Long term (current) use of insulin: Secondary | ICD-10-CM | POA: Diagnosis not present

## 2021-03-09 DIAGNOSIS — K219 Gastro-esophageal reflux disease without esophagitis: Secondary | ICD-10-CM

## 2021-03-09 DIAGNOSIS — R748 Abnormal levels of other serum enzymes: Secondary | ICD-10-CM | POA: Diagnosis not present

## 2021-03-09 DIAGNOSIS — Z6837 Body mass index (BMI) 37.0-37.9, adult: Secondary | ICD-10-CM

## 2021-03-09 DIAGNOSIS — E559 Vitamin D deficiency, unspecified: Secondary | ICD-10-CM | POA: Diagnosis not present

## 2021-03-09 DIAGNOSIS — E785 Hyperlipidemia, unspecified: Secondary | ICD-10-CM | POA: Diagnosis not present

## 2021-03-09 DIAGNOSIS — M67911 Unspecified disorder of synovium and tendon, right shoulder: Secondary | ICD-10-CM

## 2021-03-09 DIAGNOSIS — I10 Essential (primary) hypertension: Secondary | ICD-10-CM | POA: Diagnosis not present

## 2021-03-09 DIAGNOSIS — E1169 Type 2 diabetes mellitus with other specified complication: Secondary | ICD-10-CM | POA: Diagnosis not present

## 2021-03-09 DIAGNOSIS — Z0001 Encounter for general adult medical examination with abnormal findings: Secondary | ICD-10-CM | POA: Diagnosis not present

## 2021-03-09 DIAGNOSIS — M5412 Radiculopathy, cervical region: Secondary | ICD-10-CM

## 2021-03-09 DIAGNOSIS — J301 Allergic rhinitis due to pollen: Secondary | ICD-10-CM

## 2021-03-09 LAB — COMPREHENSIVE METABOLIC PANEL
ALT: 12 U/L (ref 0–35)
AST: 15 U/L (ref 0–37)
Albumin: 4.2 g/dL (ref 3.5–5.2)
Alkaline Phosphatase: 124 U/L — ABNORMAL HIGH (ref 39–117)
BUN: 13 mg/dL (ref 6–23)
CO2: 28 mEq/L (ref 19–32)
Calcium: 9.9 mg/dL (ref 8.4–10.5)
Chloride: 105 mEq/L (ref 96–112)
Creatinine, Ser: 0.85 mg/dL (ref 0.40–1.20)
GFR: 73.94 mL/min (ref >60.00)
Glucose, Bld: 95 mg/dL (ref 70–99)
Potassium: 3.7 mEq/L (ref 3.5–5.1)
Sodium: 140 mEq/L (ref 135–145)
Total Bilirubin: 0.4 mg/dL (ref 0.2–1.2)
Total Protein: 7.1 g/dL (ref 6.0–8.3)

## 2021-03-09 LAB — CBC
HCT: 39.8 % (ref 36.0–46.0)
Hemoglobin: 13 g/dL (ref 12.0–15.0)
MCHC: 32.6 g/dL (ref 30.0–36.0)
MCV: 88.8 fl (ref 78.0–100.0)
Platelets: 276 10*3/uL (ref 150.0–400.0)
RBC: 4.48 Mil/uL (ref 3.87–5.11)
RDW: 14.1 % (ref 11.5–15.5)
WBC: 4.3 10*3/uL (ref 4.0–10.5)

## 2021-03-09 LAB — LIPID PANEL
Cholesterol: 163 mg/dL (ref 0–200)
HDL: 63.3 mg/dL (ref >39.00)
LDL Cholesterol: 83 mg/dL (ref 0–99)
NonHDL: 99.25
Total CHOL/HDL Ratio: 3
Triglycerides: 79 mg/dL (ref 0.0–149.0)
VLDL: 15.8 mg/dL (ref 0.0–40.0)

## 2021-03-09 LAB — MICROALBUMIN / CREATININE URINE RATIO
Creatinine,U: 128.2 mg/dL
Microalb Creat Ratio: 0.8 mg/g (ref 0.0–30.0)
Microalb, Ur: 1.1 mg/dL (ref 0.0–1.9)

## 2021-03-09 MED ORDER — LEVOCETIRIZINE DIHYDROCHLORIDE 5 MG PO TABS: 5.0000 mg | ORAL_TABLET | Freq: Every evening | ORAL | 3 refills | Status: DC

## 2021-03-09 MED ORDER — CYCLOBENZAPRINE HCL 10 MG PO TABS
10.0000 mg | ORAL_TABLET | Freq: Every evening | ORAL | 0 refills | Status: DC | PRN
Start: 2021-03-09 — End: 2021-06-15

## 2021-03-09 MED ORDER — OMEPRAZOLE 20 MG PO CPDR: 20.0000 mg | DELAYED_RELEASE_CAPSULE | Freq: Every day | ORAL | 1 refills | Status: DC

## 2021-03-09 MED ORDER — ATORVASTATIN CALCIUM 10 MG PO TABS: 10.0000 mg | ORAL_TABLET | Freq: Every day | ORAL | 1 refills | Status: DC

## 2021-03-09 MED ORDER — MELOXICAM 15 MG PO TABS
15.0000 mg | ORAL_TABLET | Freq: Every day | ORAL | 1 refills | Status: DC
Start: 2021-03-09 — End: 2021-10-30

## 2021-03-09 MED ORDER — GABAPENTIN 300 MG PO CAPS: 300.0000 mg | ORAL_CAPSULE | Freq: Two times a day (BID) | ORAL | 3 refills | Status: DC

## 2021-03-09 NOTE — Progress Notes (Signed)
Subjective:    Patient ID: [name], female    DOB: March 11, 1961, 60 y.o.   MRN: [id number]  Patient presents today for CPE and eval of chronic conditions  HPI Essential hypertension BP at goal with losartan and furosemide. Also followed by cardiology due to hx of diastolic dysfunction. No Le edema, no SOB, no PND BP Readings from Last 3 Encounters:  03/09/21 130/78  02/10/21 130/86  08/01/20 120/84   Maintain current medications  DM (diabetes mellitus) ([place]) Followed by Dr. 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 Has upcoming dental and ophthalmology appt. LDL at goal with atorvastatin Current use of losartan  Repeat urine microalbumin Maintain appt with endocrinology. Continue gabapentin  Hyperlipidemia Repeat lipid panel Continue atorvastatin  Gastroesophageal reflux disease without esophagitis Controlled with omeprazole No blood in stool Refill sent.  Depression/Suicide: Depression screen [facility] 2/9 03/09/2021 04/08/2020 03/31/2019 11/21/2018 05/20/2018 04/15/2018 03/11/2018  Decreased Interest 0 0 0 0 0 0 0  Down, Depressed, Hopeless 0 1 0 0 0 0 0  PHQ - 2 Score 0 1 0 0 0 0 0  Altered sleeping 1 0 0 - - - 0  Tired, decreased energy 0 0 0 - - - 0  Change in appetite 2 0 0 - - - 0  Feeling bad or failure about yourself  0 0 0 - - - 0  Trouble concentrating 0 0 0 - - - 0  Moving slowly or fidgety/restless 0 0 0 - - - 0  Suicidal thoughts 0 0 0 - - - 0  PHQ-9 Score 3 1 0 - - - 0  Difficult doing work/chores Not difficult at all Not difficult at all Not difficult at all - - - Not difficult at all  Some recent data might be hidden   Vision:upcoming appt  Dental:will schedule  Immunizations: (TDAP, Hep C screen, Pneumovax, Influenza, zoster)  Health Maintenance  Topic Date Due  . COVID-19 Vaccine (3 - Booster for Moderna series) 07/14/2020  . Eye exam for diabetics  03/08/2021  . Tetanus Vaccine  03/09/2022*  . Mammogram   07/15/2021  . Hemoglobin A1C  08/13/2021  . Complete foot exam   02/10/2022  . Colon Cancer Screening  02/12/2026  . Pneumococcal vaccine  Completed  .  Hepatitis C: One time screening is recommended by Center for Disease Control  (CDC) for  adults born from [birth year] through 1967.   Completed  . HIV Screening  Completed  . HPV Vaccine  Aged Out  *Topic was postponed. The date shown is not the original due date.   Diet:low carb Exercise: walking 46mile 3-4x/week Weight:  Wt Readings from Last 3 Encounters:  03/09/21 224 lb 9.6 oz (101.9 kg)  02/10/21 221 lb 9.6 oz (100.5 kg)  08/01/20 227 lb 6.4 oz (103.1 kg)   Fall Risk: Fall Risk  03/09/2021 03/31/2019 11/21/2018 05/20/2018 04/15/2018 03/11/2018 07/01/2017  Falls in the past year? 0 0 0 No No No No  Number falls in past yr: 0 0 - - - - -  Injury with Fall? 0 0 - - - - -  Risk for fall due to : No Fall Risks - - - - - -  Follow up Falls evaluation completed Falls evaluation completed - - - - -   Medications and allergies reviewed with patient and updated if appropriate.  Patient Active Problem List   Diagnosis Date Noted  . Hyperlipidemia 03/09/2021  . Vitamin D  deficiency disease 03/09/2021  . Diastolic dysfunction 39/76/7341  . DM (diabetes mellitus) ([place]) 04/15/2018  . Gastroesophageal reflux disease without esophagitis 03/11/2018  . Need for pneumococcal vaccination 03/11/2018  . Essential hypertension 06/13/2016  . Diabetes mellitus without complication ([place]) 93/79/0240  . Carpal tunnel syndrome 12/29/2015  . Lesion of ulnar nerve 12/29/2015  . Cervical radiculopathy 11/28/2015  . Primary osteoarthritis of both knees 10/10/2014  . Trigger finger, acquired 10/10/2014    Current Outpatient Medications on File Prior to Visit  Medication Sig Dispense Refill  . azelastine (ASTELIN) 0.1 % nasal spray Place 2 sprays into both nostrils 2 (two) times daily. Use in each nostril as directed 30 mL 12  . BAYER CONTOUR TEST test strip USE  TWICE DAILY AS INSTRUCTED TO CHECK SUGARS. DX E. 11.9 100 each 5  . BLACK COHOSH EXTRACT PO Take by mouth.    . Cinnamon 500 MG capsule Take 1,000 mg by mouth 2 (two) times daily.    . Dulaglutide (TRULICITY) 1.5 XB/3.5HG SOPN Inject 1.5 mg into the skin once a week. 6 mL 3  . Echinacea-Goldenseal (ECHINACEA COMB/GOLDEN SEAL PO) Take by mouth.    . empagliflozin (JARDIANCE) 25 MG TABS tablet Take 25 mg by mouth daily. 90 tablet 3  . fluocinonide cream (LIDEX) 9.92 % Apply 1 application topically 2 (two) times daily.    . fluticasone (FLONASE) 50 MCG/ACT nasal spray Place 2 sprays into both nostrils daily. 16 g 2  . furosemide (LASIX) 20 MG tablet TAKE 1 TABLET BY MOUTH EVERY DAY 90 tablet 1  . GLUCOSAMINE PO Take 1 tablet by mouth 2 (two) times daily.    Marland Kitchen glucose blood (CONTOUR NEXT TEST) test strip 1 each by Other route in the morning and at bedtime. And lancets 2/day 180 each 12  . insulin degludec (TRESIBA FLEXTOUCH) 200 UNIT/ML FlexTouch Pen Inject 70 Units into the skin daily. 30 mL 2  . Insulin Pen Needle (BD PEN NEEDLE NANO 2ND GEN) 32G X 4 MM MISC 1 each by Other route daily. 90 each 3  . losartan (COZAAR) 25 MG tablet TAKE 1 TABLET BY MOUTH EVERY DAY 90 tablet 2  . metFORMIN (GLUCOPHAGE-XR) 500 MG 24 hr tablet Take 2 tablets (1,000 mg total) by mouth daily with breakfast. 180 tablet 3  . MICROLET LANCETS MISC USE TO MONITOR GLUCOSE LEVELS TWICE DAILY 100 each 1  . Misc Natural Products (HERBAL ENERGY COMPLEX PO) Take by mouth 2 (two) times daily with a meal.    . Multiple Vitamins-Minerals (WOMENS MULTIVITAMIN PO) Take by mouth daily.    . nitroGLYCERIN (NITRODUR - DOSED IN MG/24 HR) 0.2 mg/hr patch Apply 1/4 patch daily to tendon for tendonitis. 30 patch 1  . Omega-3 1000 MG CAPS Take 1 g by mouth daily.    . vitamin B-12 (CYANOCOBALAMIN) 100 MCG tablet Take 100 mcg by mouth daily.    . vitamin C (ASCORBIC ACID) 500 MG tablet Take 1,000 mg by mouth daily.    . Vitamin D,  Ergocalciferol, (DRISDOL) 1.25 MG (50000 UNIT) CAPS capsule Take 1 capsule by mouth once a week for 12 weeks 4 capsule 3  . vitamin E 400 UNIT capsule Take 400 Units by mouth daily. Reported on 02/13/2016     No current facility-administered medications on file prior to visit.    Past Medical History:  Diagnosis Date  . Anemia   . Arthritis    bilateral knees  . Breast cancer screening 12/20/2015  . Bronchitis  hx of  . Carpal tunnel syndrome 12/29/2015  . Cervical radiculopathy 11/28/2015  . Diabetes mellitus without complication ([place]) 04/10/7740  . Diastolic dysfunction 28/78/6767  . Essential hypertension 06/13/2016  . Fibroid tumor    Had partial hysterectomy  . Gallstones   . Gastroesophageal reflux disease without esophagitis 03/11/2018  . Lesion of ulnar nerve 12/29/2015  . Migraine    hx of  . Obesity (BMI 30-39.9) 11/04/2019  . Obesity (BMI 30.0-34.9) 03/31/2019  . Primary osteoarthritis of both knees 10/10/2014  . Trigger finger, acquired 10/10/2014  . Uncontrolled type 2 diabetes mellitus with hyperglycemia ([place]) 04/15/2018  . Visit for preventive health examination 12/20/2015    Past Surgical History:  Procedure Laterality Date  . ABDOMINAL HYSTERECTOMY     partial   . boil removed     . CARPAL TUNNEL RELEASE  04-15-15  . CHOLECYSTECTOMY  Oct 2012  . ELBOW SURGERY  04-15-15  . HAND SURGERY  04-15-15  . Nerve damage  04-15-15  . TOTAL KNEE ARTHROPLASTY  08/01/2012   Procedure: TOTAL KNEE ARTHROPLASTY;  Surgeon: Augustin Schooling, MD;  Location: [facility];  Service: Orthopedics;  Laterality: Right;  RIGHT TOTAL KNEE ARTHROPLASTY  . TRIGGER FINGER RELEASE  04-15-15  . TUBAL LIGATION    . WISDOM TOOTH EXTRACTION      Social History   Socioeconomic History  . Marital status: Single    Spouse name: Not on file  . Number of children: 2  . Years of education: Not on file  . Highest education level: Not on file  Occupational History  . Not on file  Tobacco Use  . Smoking status:  Former Smoker    Packs/day: 0.25    Years: 20.00    Pack years: 5.00    Types: Cigarettes    Quit date: 12/09/2009    Years since quitting: 11.2  . Smokeless tobacco: Never Used  Vaping Use  . Vaping Use: Never used  Substance and Sexual Activity  . Alcohol use: Not Currently    Alcohol/week: 0.0 standard drinks    Comment: social  . Drug use: No  . Sexual activity: Not Currently    Birth control/protection: Surgical  Other Topics Concern  . Not on file  Social History Narrative  . Not on file   Social Determinants of Health   Financial Resource Strain: Not on file  Food Insecurity: Not on file  Transportation Needs: Not on file  Physical Activity: Not on file  Stress: Not on file  Social Connections: Not on file   Family History  Problem Relation Age of Onset  . Diabetes Father 88       Deceased  . Diabetes Mother        Living  . Hyperlipidemia Mother   . Tuberculosis Mother   . Asthma Mother   . Heart disease Maternal Uncle   . Colon cancer Maternal Uncle        dx in 26's  . Diabetes Brother   . Heart disease Maternal Aunt   . Arthritis Other        Maternal Aunts & Uncles  . Diabetes Sister   . Kidney failure Brother 30       Diseased  . Healthy Son        x1  . Breast cancer Cousin 41  . Esophageal cancer Neg Hx   . Rectal cancer Neg Hx   . Stomach cancer Neg Hx        Review  of Systems  Constitutional: Negative for fever, malaise/fatigue and weight loss.  HENT: Negative for congestion and sore throat.   Eyes:       Negative for visual changes  Respiratory: Negative for cough and shortness of breath.   Cardiovascular: Negative for chest pain, palpitations and leg swelling.  Gastrointestinal: Negative for blood in stool, constipation, diarrhea and heartburn.  Genitourinary: Negative for dysuria, frequency and urgency.  Musculoskeletal: Negative for falls, joint pain and myalgias.  Skin: Negative for rash.  Neurological: Negative for  dizziness, sensory change and headaches.  Endo/Heme/Allergies: Does not bruise/bleed easily.  Psychiatric/Behavioral: Negative for depression, substance abuse and suicidal ideas. The patient is not nervous/anxious.     Objective:   Vitals:   03/09/21 1025  BP: 130/78  Pulse: 82  Temp: (!) 97.4 F (36.3 C)  SpO2: 97%    Body mass index is 37.09 kg/m.   Physical Examination:  Physical Exam Vitals reviewed.  Constitutional:      General: She is not in acute distress.    Appearance: She is obese.  HENT:     Right Ear: Tympanic membrane, ear canal and external ear normal.     Left Ear: Tympanic membrane, ear canal and external ear normal.  Eyes:     General: No scleral icterus.    Extraocular Movements: Extraocular movements intact.     Conjunctiva/sclera: Conjunctivae normal.  Neck:     Thyroid: No thyromegaly.  Cardiovascular:     Rate and Rhythm: Normal rate and regular rhythm.     Pulses: Normal pulses.          Dorsalis pedis pulses are 2+ on the right side and 2+ on the left side.       Posterior tibial pulses are 2+ on the right side and 2+ on the left side.     Heart sounds: Normal heart sounds.  Pulmonary:     Effort: Pulmonary effort is normal.     Breath sounds: Normal breath sounds.  Chest:     Chest wall: No tenderness.  Breasts:     Right: Normal. No axillary adenopathy or supraclavicular adenopathy.     Left: Normal. No axillary adenopathy or supraclavicular adenopathy.    Abdominal:     General: Bowel sounds are normal. There is no distension.     Palpations: Abdomen is soft.     Tenderness: There is no abdominal tenderness.  Musculoskeletal:        General: No tenderness. Normal range of motion.     Cervical back: Normal range of motion and neck supple.     Right lower leg: No edema.     Left lower leg: No edema.     Right foot: Normal range of motion. No bunion or prominent metatarsal heads.     Left foot: Normal range of motion. No bunion or  prominent metatarsal heads.  Feet:     Right foot:     Skin integrity: Skin integrity normal.     Toenail Condition: Right toenails are normal.     Left foot:     Skin integrity: Skin integrity normal.     Toenail Condition: Left toenails are normal.  Lymphadenopathy:     Cervical: No cervical adenopathy.     Upper Body:     Right upper body: No supraclavicular, axillary or pectoral adenopathy.     Left upper body: No supraclavicular, axillary or pectoral adenopathy.  Skin:    General: Skin is warm and dry.  Neurological:  Mental Status: She is alert and oriented to person, place, and time.  Psychiatric:        Mood and Affect: Mood normal.        Behavior: Behavior normal.        Thought Content: Thought content normal.        Judgment: Judgment normal.     ASSESSMENT and PLAN: This visit occurred during the SARS-CoV-2 public health emergency.  Safety protocols were in place, including screening questions prior to the visit, additional usage of staff PPE, and extensive cleaning of exam room while observing appropriate contact time as indicated for disinfecting solutions.   Gracynn was seen today for establish care.  Diagnoses and all orders for this visit:  Encounter for preventative adult health care exam with abnormal findings -     Comprehensive metabolic panel -     CBC  Essential hypertension  Vitamin D deficiency disease -     Vitamin D 1,25 dihydroxy  Hyperlipidemia, unspecified hyperlipidemia type -     Lipid panel -     atorvastatin (LIPITOR) 10 MG tablet; Take 1 tablet (10 mg total) by mouth daily.  Class 2 severe obesity due to excess calories with serious comorbidity and body mass index (BMI) of 37.0 to 37.9 in adult (HCC) -     Amb Ref to Medical Weight Management  Primary osteoarthritis of both knees  Gastroesophageal reflux disease without esophagitis -     omeprazole (PRILOSEC) 20 MG capsule; Take 1 capsule (20 mg total) by mouth daily.  Type 2  diabetes mellitus with other specified complication, with long-term current use of insulin (HCC) -     Microalbumin / creatinine urine ratio  Tendinopathy of right rotator cuff -     cyclobenzaprine (FLEXERIL) 10 MG tablet; Take 1 tablet (10 mg total) by mouth at bedtime as needed for muscle spasms.  Cervical radiculopathy -     gabapentin (NEURONTIN) 300 MG capsule; Take 1 capsule (300 mg total) by mouth 2 (two) times daily. -     meloxicam (MOBIC) 15 MG tablet; Take 1 tablet (15 mg total) by mouth daily.  Seasonal allergic rhinitis due to pollen -     levocetirizine (XYZAL) 5 MG tablet; Take 1 tablet (5 mg total) by mouth every evening.  Have eye exam reports faxed to me once completed Schedule appt for mammogram and dental cleaning. You will be contacted to schedule an appt with weight loss clinic    Problem List Items Addressed This Visit      Cardiovascular and Mediastinum   Essential hypertension    BP at goal with losartan and furosemide. Also followed by cardiology due to hx of diastolic dysfunction. No Le edema, no SOB, no PND BP Readings from Last 3 Encounters:  03/09/21 130/78  02/10/21 130/86  08/01/20 120/84   Maintain current medications      Relevant Medications   atorvastatin (LIPITOR) 10 MG tablet     Digestive   Gastroesophageal reflux disease without esophagitis    Controlled with omeprazole No blood in stool Refill sent.      Relevant Medications   omeprazole (PRILOSEC) 20 MG capsule     Endocrine   DM (diabetes mellitus) ([place])    Followed by Dr. 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 Has upcoming dental and ophthalmology appt. LDL at goal with atorvastatin Current use of losartan  Repeat urine microalbumin Maintain appt with endocrinology. Continue gabapentin  Relevant Medications   atorvastatin (LIPITOR) 10 MG tablet   Other Relevant Orders   Microalbumin / creatinine urine ratio      Nervous and Auditory   Cervical radiculopathy   Relevant Medications   cyclobenzaprine (FLEXERIL) 10 MG tablet   gabapentin (NEURONTIN) 300 MG capsule   meloxicam (MOBIC) 15 MG tablet     Musculoskeletal and Integument   Primary osteoarthritis of both knees   Relevant Medications   cyclobenzaprine (FLEXERIL) 10 MG tablet   meloxicam (MOBIC) 15 MG tablet     Other   Hyperlipidemia    Repeat lipid panel Continue atorvastatin      Relevant Medications   atorvastatin (LIPITOR) 10 MG tablet   Other Relevant Orders   Lipid panel   Vitamin D deficiency disease   Relevant Orders   Vitamin D 1,25 dihydroxy    Other Visit Diagnoses    Encounter for preventative adult health care exam with abnormal findings    -  Primary   Relevant Orders   Comprehensive metabolic panel   CBC   Class 2 severe obesity due to excess calories with serious comorbidity and body mass index (BMI) of 37.0 to 37.9 in adult [facility])       Relevant Orders   Amb Ref to Medical Weight Management   Tendinopathy of right rotator cuff       Relevant Medications   cyclobenzaprine (FLEXERIL) 10 MG tablet   Seasonal allergic rhinitis due to pollen       Relevant Medications   levocetirizine (XYZAL) 5 MG tablet      Follow up: Return in about 6 months (around 09/08/2021) for HTN and , hyperlipidemia (fasting).  Wilfred Lacy, NP

## 2021-03-09 NOTE — Assessment & Plan Note (Signed)
Controlled with omeprazole No blood in stool Refill sent.

## 2021-03-09 NOTE — Assessment & Plan Note (Signed)
Followed by Dr. 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 Has upcoming dental and ophthalmology appt. LDL at goal with atorvastatin Current use of losartan  Repeat urine microalbumin Maintain appt with endocrinology. Continue gabapentin

## 2021-03-09 NOTE — Assessment & Plan Note (Signed)
BP at goal with losartan and furosemide. Also followed by cardiology due to hx of diastolic dysfunction. No Le edema, no SOB, no PND BP Readings from Last 3 Encounters:  03/09/21 130/78  02/10/21 130/86  08/01/20 120/84   Maintain current medications

## 2021-03-09 NOTE — Patient Instructions (Signed)
Go to lab for blood draw and urine collection Have eye exam reports faxed to me once completed Schedule appt for mammogram and dental cleaning.  You will be contacted to schedule an appt with weight loss clinic

## 2021-03-09 NOTE — Assessment & Plan Note (Signed)
Repeat lipid panel Continue atorvastatin 

## 2021-03-10 ENCOUNTER — Other Ambulatory Visit: Payer: Self-pay | Admitting: Nurse Practitioner

## 2021-03-10 DIAGNOSIS — Z1231 Encounter for screening mammogram for malignant neoplasm of breast: Secondary | ICD-10-CM

## 2021-03-10 LAB — HM DIABETES EYE EXAM

## 2021-03-10 NOTE — Telephone Encounter (Signed)
Please advise 

## 2021-03-14 LAB — VITAMIN D 1,25 DIHYDROXY
Vitamin D 1, 25 (OH)2 Total: 42 pg/mL (ref 18–72)
Vitamin D2 1, 25 (OH)2: 12 pg/mL
Vitamin D3 1, 25 (OH)2: 30 pg/mL

## 2021-03-18 DIAGNOSIS — R748 Abnormal levels of other serum enzymes: Secondary | ICD-10-CM | POA: Insufficient documentation

## 2021-03-18 NOTE — Addendum Note (Signed)
Addended by: Leana Gamer on: 03/18/2021 04:32 PM   Modules accepted: Orders

## 2021-03-22 DIAGNOSIS — K8020 Calculus of gallbladder without cholecystitis without obstruction: Secondary | ICD-10-CM | POA: Insufficient documentation

## 2021-03-22 DIAGNOSIS — D649 Anemia, unspecified: Secondary | ICD-10-CM | POA: Insufficient documentation

## 2021-03-22 DIAGNOSIS — M1990 Unspecified osteoarthritis, unspecified site: Secondary | ICD-10-CM | POA: Insufficient documentation

## 2021-03-22 DIAGNOSIS — D219 Benign neoplasm of connective and other soft tissue, unspecified: Secondary | ICD-10-CM | POA: Insufficient documentation

## 2021-03-22 DIAGNOSIS — Z90710 Acquired absence of both cervix and uterus: Secondary | ICD-10-CM | POA: Insufficient documentation

## 2021-03-22 DIAGNOSIS — J40 Bronchitis, not specified as acute or chronic: Secondary | ICD-10-CM | POA: Insufficient documentation

## 2021-03-22 DIAGNOSIS — G43909 Migraine, unspecified, not intractable, without status migrainosus: Secondary | ICD-10-CM | POA: Insufficient documentation

## 2021-03-27 ENCOUNTER — Encounter: Payer: Self-pay | Admitting: Cardiology

## 2021-03-27 ENCOUNTER — Other Ambulatory Visit: Payer: Self-pay

## 2021-03-27 ENCOUNTER — Ambulatory Visit (INDEPENDENT_AMBULATORY_CARE_PROVIDER_SITE_OTHER): Payer: PRIVATE HEALTH INSURANCE | Admitting: Cardiology

## 2021-03-27 VITALS — BP 126/84 | HR 75 | Ht 65.25 in | Wt 229.0 lb

## 2021-03-27 DIAGNOSIS — Z794 Long term (current) use of insulin: Secondary | ICD-10-CM

## 2021-03-27 DIAGNOSIS — E785 Hyperlipidemia, unspecified: Secondary | ICD-10-CM

## 2021-03-27 DIAGNOSIS — E119 Type 2 diabetes mellitus without complications: Secondary | ICD-10-CM

## 2021-03-27 DIAGNOSIS — I10 Essential (primary) hypertension: Secondary | ICD-10-CM | POA: Diagnosis not present

## 2021-03-27 DIAGNOSIS — I2510 Atherosclerotic heart disease of native coronary artery without angina pectoris: Secondary | ICD-10-CM | POA: Insufficient documentation

## 2021-03-27 DIAGNOSIS — E669 Obesity, unspecified: Secondary | ICD-10-CM

## 2021-03-27 DIAGNOSIS — G4730 Sleep apnea, unspecified: Secondary | ICD-10-CM | POA: Diagnosis not present

## 2021-03-27 HISTORY — DX: Sleep apnea, unspecified: G47.30

## 2021-03-27 NOTE — Patient Instructions (Signed)

## 2021-03-27 NOTE — Progress Notes (Signed)
Cardiology Office Note:    Date:  03/27/2021   ID:  [name], DOB 1961-03-03, MRN [id number]  PCP:  Flossie Buffy, NP  Cardiologist:  Berniece Salines, DO  Electrophysiologist:  None   Referring MD: Flossie Buffy, NP   Chief Complaint  Patient presents with  . Annual Exam    History of Present Illness:    [name] is a 60 y.o. female with a hx of minimal coronary artery disease, severe obstructive sleep apnea pending CPAP, hyperlipidemia, diabetes mellitus, obesity is here today for follow-up visit.  Last saw the patient in March 2021 at that time we scheduled the patient for sleep study.  In the interim she was able to get a sleep study done which showed severe sleep apnea but has not been fitted for CPAP.  She is here today for follow-up visit.  She offers no complaints at this time.   Past Medical History:  Diagnosis Date  . Anemia   . Arthritis    bilateral knees  . Breast cancer screening 12/20/2015  . Bronchitis    hx of  . Carpal tunnel syndrome 12/29/2015  . Cervical radiculopathy 11/28/2015  . Diabetes mellitus without complication ([place]) 09/01/3006  . Diastolic dysfunction 62/26/3335  . Essential hypertension 06/13/2016  . Fibroid tumor    Had partial hysterectomy  . Gallstones   . Gastroesophageal reflux disease without esophagitis 03/11/2018  . Lesion of ulnar nerve 12/29/2015  . Migraine    hx of  . Obesity (BMI 30-39.9) 11/04/2019  . Obesity (BMI 30.0-34.9) 03/31/2019  . Primary osteoarthritis of both knees 10/10/2014  . Severe sleep apnea 03/27/2021  . Trigger finger, acquired 10/10/2014  . Uncontrolled type 2 diabetes mellitus with hyperglycemia ([place]) 04/15/2018  . Visit for preventive health examination 12/20/2015    Past Surgical History:  Procedure Laterality Date  . ABDOMINAL HYSTERECTOMY     partial   . boil removed     . CARPAL TUNNEL RELEASE  04-15-15  . CHOLECYSTECTOMY  Oct 2012  . ELBOW SURGERY  04-15-15  . HAND SURGERY  04-15-15  .  Nerve damage  04-15-15  . TOTAL KNEE ARTHROPLASTY  08/01/2012   Procedure: TOTAL KNEE ARTHROPLASTY;  Surgeon: Augustin Schooling, MD;  Location: [place] [facility];  Service: Orthopedics;  Laterality: Right;  RIGHT TOTAL KNEE ARTHROPLASTY  . TRIGGER FINGER RELEASE  04-15-15  . TUBAL LIGATION    . WISDOM TOOTH EXTRACTION      Current Medications: Current Meds  Medication Sig  . atorvastatin (LIPITOR) 10 MG tablet Take 1 tablet (10 mg total) by mouth daily.  Marland Kitchen azelastine (ASTELIN) 0.1 % nasal spray Place 2 sprays into both nostrils 2 (two) times daily. Use in each nostril as directed  . BLACK COHOSH EXTRACT PO Take 1 tablet by mouth daily.  . cholecalciferol (VITAMIN D3) 25 MCG (1000 UNIT) tablet Take 1,000 Units by mouth daily.  . Cinnamon 500 MG capsule Take 1,000 mg by mouth 2 (two) times daily.  . cyclobenzaprine (FLEXERIL) 10 MG tablet Take 1 tablet (10 mg total) by mouth at bedtime as needed for muscle spasms.  . Dulaglutide (TRULICITY) 1.5 KT/6.2BW SOPN Inject 1.5 mg into the skin once a week.  . Echinacea-Goldenseal (ECHINACEA COMB/GOLDEN SEAL PO) Take 1 capsule by mouth daily as needed (Allergies).  . empagliflozin (JARDIANCE) 25 MG TABS tablet Take 25 mg by mouth daily.  . fluocinonide cream (LIDEX) 3.89 % Apply 1 application topically 2 (two) times daily as needed (  Itching).  . fluticasone (FLONASE) 50 MCG/ACT nasal spray Place 2 sprays into both nostrils as needed for allergies or rhinitis.  . furosemide (LASIX) 40 MG tablet Take 40 mg by mouth as needed for edema.  . gabapentin (NEURONTIN) 300 MG capsule Take 1 capsule (300 mg total) by mouth 2 (two) times daily.  Marland Kitchen GLUCOSAMINE PO Take 1 tablet by mouth 2 (two) times daily.  . insulin degludec (TRESIBA FLEXTOUCH) 200 UNIT/ML FlexTouch Pen Inject 70 Units into the skin daily.  Marland Kitchen levocetirizine (XYZAL) 5 MG tablet Take 1 tablet (5 mg total) by mouth every evening.  Marland Kitchen losartan (COZAAR) 25 MG tablet Take 25 mg by mouth daily.  . meloxicam (MOBIC) 15 MG  tablet Take 1 tablet (15 mg total) by mouth daily.  . metFORMIN (GLUCOPHAGE-XR) 500 MG 24 hr tablet Take 2 tablets (1,000 mg total) by mouth daily with breakfast.  . Multiple Vitamins-Minerals (WOMENS MULTIVITAMIN PO) Take by mouth daily.  . nitroGLYCERIN (NITRODUR - DOSED IN MG/24 HR) 0.2 mg/hr patch Place 0.2 mg onto the skin daily. Apply 1/4 patch daily as needed for tondonitis  . Omega-3 1000 MG CAPS Take 1 g by mouth daily.  Marland Kitchen omeprazole (PRILOSEC) 20 MG capsule Take 1 capsule (20 mg total) by mouth daily.  . vitamin B-12 (CYANOCOBALAMIN) 100 MCG tablet Take 100 mcg by mouth daily.  . vitamin C (ASCORBIC ACID) 500 MG tablet Take 1,000 mg by mouth daily.  . vitamin E 400 UNIT capsule Take 400 Units by mouth daily. Reported on 02/13/2016     Allergies:   Penicillins and Tramadol   Social History   Socioeconomic History  . Marital status: Single    Spouse name: Not on file  . Number of children: 2  . Years of education: Not on file  . Highest education level: Not on file  Occupational History  . Not on file  Tobacco Use  . Smoking status: Former Smoker    Packs/day: 0.25    Years: 20.00    Pack years: 5.00    Types: Cigarettes    Quit date: 12/09/2009    Years since quitting: 11.3  . Smokeless tobacco: Never Used  Vaping Use  . Vaping Use: Never used  Substance and Sexual Activity  . Alcohol use: Not Currently    Alcohol/week: 0.0 standard drinks    Comment: social  . Drug use: No  . Sexual activity: Not Currently    Birth control/protection: Surgical  Other Topics Concern  . Not on file  Social History Narrative  . Not on file   Social Determinants of Health   Financial Resource Strain: Not on file  Food Insecurity: Not on file  Transportation Needs: Not on file  Physical Activity: Not on file  Stress: Not on file  Social Connections: Not on file     Family History: The patient's family history includes Arthritis in an other family member; Asthma in her  mother; Breast cancer (age of onset: 20) in her cousin; Colon cancer in her maternal uncle; Diabetes in her brother, mother, and sister; Diabetes (age of onset: 16) in her father; Healthy in her son; Heart disease in her maternal aunt and maternal uncle; Hyperlipidemia in her mother; Kidney failure (age of onset: 38) in her brother; Tuberculosis in her mother. There is no history of Esophageal cancer, Rectal cancer, or Stomach cancer.  ROS:   Review of Systems  Constitution: Negative for decreased appetite, fever and weight gain.  HENT: Negative for congestion,  ear discharge, hoarse voice and sore throat.   Eyes: Negative for discharge, redness, vision loss in right eye and visual halos.  Cardiovascular: Negative for chest pain, dyspnea on exertion, leg swelling, orthopnea and palpitations.  Respiratory: Negative for cough, hemoptysis, shortness of breath and snoring.   Endocrine: Negative for heat intolerance and polyphagia.  Hematologic/Lymphatic: Negative for bleeding problem. Does not bruise/bleed easily.  Skin: Negative for flushing, nail changes, rash and suspicious lesions.  Musculoskeletal: Negative for arthritis, joint pain, muscle cramps, myalgias, neck pain and stiffness.  Gastrointestinal: Negative for abdominal pain, bowel incontinence, diarrhea and excessive appetite.  Genitourinary: Negative for decreased libido, genital sores and incomplete emptying.  Neurological: Negative for brief paralysis, focal weakness, headaches and loss of balance.  Psychiatric/Behavioral: Negative for altered mental status, depression and suicidal ideas.  Allergic/Immunologic: Negative for HIV exposure and persistent infections.    EKGs/Labs/Other Studies Reviewed:    The following studies were reviewed today:   EKG:  The ekg ordered today demonstrates sinus bradycardia rate 75 bpm with nonspecific ST changes.   Coronary CTA February 15, 2020 IMPRESSION: 1. Minimal CAD, CADRADS = 1.  2. Coronary  calcium score of 0. This was 0 percentile for age and sex matched control.  3. Normal coronary origin with right dominance.  4. Mildly increased LV wall thickness at the basal septum, 13 mm.  5. 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  Recent Labs: 04/08/2020: TSH 0.65 03/09/2021: ALT 12; BUN 13; Creatinine, Ser 0.85; Hemoglobin 13.0; Platelets 276.0; Potassium 3.7; Sodium 140  Recent Lipid Panel    Component Value Date/Time   CHOL 163 03/09/2021 1135   CHOL 175 11/04/2019 1128   TRIG 79.0 03/09/2021 1135   HDL 63.30 03/09/2021 1135   HDL 73 11/04/2019 1128   CHOLHDL 3 03/09/2021 1135   VLDL 15.8 03/09/2021 1135   LDLCALC 83 03/09/2021 1135   LDLCALC 87 11/04/2019 1128    Physical Exam:    VS:  BP 126/84 (BP Location: Right Arm, Patient Position: Sitting, Cuff Size: Normal)   Pulse 75   Ht 5' 5.25" (1.657 m)   Wt 229 lb (103.9 kg)   SpO2 97%   BMI 37.82 kg/m     Wt Readings from Last 3 Encounters:  03/27/21 229 lb (103.9 kg)  03/09/21 224 lb 9.6 oz (101.9 kg)  02/10/21 221 lb 9.6 oz (100.5 kg)     GEN: Well nourished, well developed in no acute distress HEENT: Normal NECK: No JVD; No carotid bruits LYMPHATICS: No lymphadenopathy CARDIAC: S1S2 noted,RRR, no murmurs, rubs, gallops RESPIRATORY:  Clear to auscultation without rales, wheezing or rhonchi  ABDOMEN: Soft, non-tender, non-distended, +bowel sounds, no guarding. EXTREMITIES: No edema, No cyanosis, no clubbing MUSCULOSKELETAL:  No deformity  SKIN: Warm and dry NEUROLOGIC:  Alert and oriented x 3, non-focal PSYCHIATRIC:  Normal affect, good insight  ASSESSMENT:    1. Severe sleep apnea   2. Hyperlipidemia, unspecified hyperlipidemia type   3. Hypertension, unspecified type   4. 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   5. Obesity (BMI 30-39.9)   6. Type 2 diabetes mellitus without complication, with long-term current use of insulin (HCC)    PLAN:     She has  not been treated with CPAP , will reach out today to our sleep medicine team to have the patient get set up with her CPAP but she needs this given her severe sleep apnea.  No anginal symptoms continue her  current medication regimen.  Blood pressure is acceptable, continue with current antihypertensive regimen.  Hyperlipidemia - continue with current statin medication.  The patient understands the need to lose weight with diet and exercise. We have discussed specific strategies for this.  This is being managed by his primary care doctor.  No adjustments for antidiabetic medications were made today.   The patient is in agreement with the above plan. The patient left the office in stable condition.  The patient will follow up in 1 year or sooner if needed.   Medication Adjustments/Labs and Tests Ordered: Current medicines are reviewed at length with the patient today.  Concerns regarding medicines are outlined above.  Orders Placed This Encounter  Procedures  . EKG 12-Lead   No orders of the defined types were placed in this encounter.   Patient Instructions  Medication Instructions:  Your physician recommends that you continue on your current medications as directed. Please refer to the Current Medication list given to you today.  *If you need a refill on your cardiac medications before your next appointment, please call your pharmacy*   Lab Work: None If you have labs (blood work) drawn today and your tests are completely normal, you will receive your results only by: Marland Kitchen MyChart Message (if you have MyChart) OR . A paper copy in the mail If you have any lab test that is abnormal or we need to change your treatment, we will call you to review the results.   Testing/Procedures: None   Follow-Up: At [facility], you and your health needs are our priority.  As part of our continuing mission to provide you with exceptional heart care, we have created designated Provider Care  Teams.  These Care Teams include your primary Cardiologist (physician) and Advanced Practice Providers (APPs -  Physician Assistants and Nurse Practitioners) who all work together to provide you with the care you need, when you need it.  We recommend signing up for the patient portal called "MyChart".  Sign up information is provided on this After Visit Summary.  MyChart is used to connect with patients for Virtual Visits (Telemedicine).  Patients are able to view lab/test results, encounter notes, upcoming appointments, etc.  Non-urgent messages can be sent to your provider as well.   To learn more about what you can do with MyChart, go to [email or web address].    Your next appointment:   1 year(s)  The format for your next appointment:   In Person  Provider:   Berniece Salines, DO   Other Instructions      Adopting a Healthy Lifestyle.  Know what a healthy weight is for you (roughly BMI <25) and aim to maintain this   Aim for 7+ servings of fruits and vegetables daily   65-80+ fluid ounces of water or unsweet tea for healthy kidneys   Limit to max 1 drink of alcohol per day; avoid smoking/tobacco   Limit animal fats in diet for cholesterol and heart health - choose grass fed whenever available   Avoid highly processed foods, and foods high in saturated/trans fats   Aim for low stress - take time to unwind and care for your mental health   Aim for 150 min of moderate intensity exercise weekly for heart health, and weights twice weekly for bone health   Aim for 7-9 hours of sleep daily   When it comes to diets, agreement about the perfect plan isnt easy to find, even among the experts. Experts at the [facility]  [employer] developed an idea known as the Healthy Eating Plate. Just imagine a plate divided into logical, healthy portions.   The emphasis is on diet quality:   Load up on vegetables and fruits - one-half of your plate: Aim for color and variety, and  remember that potatoes dont count.   Go for whole grains - one-quarter of your plate: Whole wheat, barley, wheat berries, quinoa, oats, brown rice, and foods made with them. If you want pasta, go with whole wheat pasta.   Protein power - one-quarter of your plate: Fish, chicken, beans, and nuts are all healthy, versatile protein sources. Limit red meat.   The diet, however, does go beyond the plate, offering a few other suggestions.   Use healthy plant oils, such as olive, canola, soy, corn, sunflower and peanut. Check the labels, and avoid partially hydrogenated oil, which have unhealthy trans fats.   If youre thirsty, drink water. Coffee and tea are good in moderation, but skip sugary drinks and limit milk and dairy products to one or two daily servings.   The type of carbohydrate in the diet is more important than the amount. Some sources of carbohydrates, such as vegetables, fruits, whole grains, and beans-are healthier than others.   Finally, stay active  Signed, Berniece Salines, DO  03/27/2021 11:59 AM    [place]

## 2021-03-30 ENCOUNTER — Telehealth: Payer: Self-pay | Admitting: *Deleted

## 2021-03-30 NOTE — Telephone Encounter (Signed)
CPAP order sent to [facility].

## 2021-04-09 ENCOUNTER — Other Ambulatory Visit: Payer: Self-pay | Admitting: Endocrinology

## 2021-04-13 ENCOUNTER — Ambulatory Visit
Admission: RE | Admit: 2021-04-13 | Discharge: 2021-04-13 | Disposition: A | Discharge status: Home / Self Care | Payer: PRIVATE HEALTH INSURANCE | Source: Ambulatory Visit | Attending: Nurse Practitioner | Admitting: Nurse Practitioner

## 2021-04-13 DIAGNOSIS — R748 Abnormal levels of other serum enzymes: Secondary | ICD-10-CM

## 2021-04-20 ENCOUNTER — Other Ambulatory Visit: Payer: Self-pay

## 2021-04-20 ENCOUNTER — Ambulatory Visit (INDEPENDENT_AMBULATORY_CARE_PROVIDER_SITE_OTHER): Payer: PRIVATE HEALTH INSURANCE | Admitting: Endocrinology

## 2021-04-20 VITALS — BP 136/84 | HR 84 | Ht 65.25 in | Wt 229.6 lb

## 2021-04-20 DIAGNOSIS — E1165 Type 2 diabetes mellitus with hyperglycemia: Secondary | ICD-10-CM | POA: Diagnosis not present

## 2021-04-20 LAB — POCT GLYCOSYLATED HEMOGLOBIN (HGB A1C): Hemoglobin A1C: 7.1 % — AB (ref 4.0–5.6)

## 2021-04-20 MED ORDER — TRULICITY 3 MG/0.5ML ~~LOC~~ SOAJ: 3.0000 mg | SUBCUTANEOUS | 3 refills | Status: DC

## 2021-04-20 MED ORDER — TRESIBA FLEXTOUCH 200 UNIT/ML ~~LOC~~ SOPN: 50.0000 [IU] | PEN_INJECTOR | Freq: Every day | SUBCUTANEOUS | 2 refills | Status: DC

## 2021-04-20 NOTE — Progress Notes (Signed)
Subjective:    Patient ID: [name], female    DOB: Jun 16, 1961, 60 y.o.   MRN: [id number]  HPI Pt returns for f/u of diabetes mellitus:  DM type: Insulin-requiring type 2.  Dx'ed: 2017, when she had a cbg of 400 on steroids.  Complications: none.  Therapy: insulin, Trulicity, and 3 oral meds.   GDM: never.   DKA: never.  Severe hypoglycemia: never.  Pancreatitis: never.    SDOH: she is on qd insulin, due to f/o noncompliance.   Interval history:  no cbg record, but states cbg's vary from 84-187.  There is no trend throughout the day.  pt states she feels well in general.  No recent steroids.  Pt says she seldom misses the insulin.  Past Medical History:  Diagnosis Date  . Anemia   . Arthritis    bilateral knees  . Breast cancer screening 12/20/2015  . Bronchitis    hx of  . Carpal tunnel syndrome 12/29/2015  . Cervical radiculopathy 11/28/2015  . Diabetes mellitus without complication ([place]) 1/76/1607  . Diastolic dysfunction 37/09/6268  . Essential hypertension 06/13/2016  . Fibroid tumor    Had partial hysterectomy  . Gallstones   . Gastroesophageal reflux disease without esophagitis 03/11/2018  . Lesion of ulnar nerve 12/29/2015  . Migraine    hx of  . Obesity (BMI 30-39.9) 11/04/2019  . Obesity (BMI 30.0-34.9) 03/31/2019  . Primary osteoarthritis of both knees 10/10/2014  . Severe sleep apnea 03/27/2021  . Trigger finger, acquired 10/10/2014  . Uncontrolled type 2 diabetes mellitus with hyperglycemia ([place]) 04/15/2018  . Visit for preventive health examination 12/20/2015    Past Surgical History:  Procedure Laterality Date  . ABDOMINAL HYSTERECTOMY     partial   . boil removed     . CARPAL TUNNEL RELEASE  04-15-15  . CHOLECYSTECTOMY  Oct 2012  . ELBOW SURGERY  04-15-15  . HAND SURGERY  04-15-15  . Nerve damage  04-15-15  . TOTAL KNEE ARTHROPLASTY  08/01/2012   Procedure: TOTAL KNEE ARTHROPLASTY;  Surgeon: Augustin Schooling, MD;  Location: [place];  Service: Orthopedics;   Laterality: Right;  RIGHT TOTAL KNEE ARTHROPLASTY  . TRIGGER FINGER RELEASE  04-15-15  . TUBAL LIGATION    . WISDOM TOOTH EXTRACTION      Social History   Socioeconomic History  . Marital status: Single    Spouse name: Not on file  . Number of children: 2  . Years of education: Not on file  . Highest education level: Not on file  Occupational History  . Not on file  Tobacco Use  . Smoking status: Former Smoker    Packs/day: 0.25    Years: 20.00    Pack years: 5.00    Types: Cigarettes    Quit date: 12/09/2009    Years since quitting: 11.3  . Smokeless tobacco: Never Used  Vaping Use  . Vaping Use: Never used  Substance and Sexual Activity  . Alcohol use: Not Currently    Alcohol/week: 0.0 standard drinks    Comment: social  . Drug use: No  . Sexual activity: Not Currently    Birth control/protection: Surgical  Other Topics Concern  . Not on file  Social History Narrative  . Not on file   Social Determinants of Health   Financial Resource Strain: Not on file  Food Insecurity: Not on file  Transportation Needs: Not on file  Physical Activity: Not on file  Stress: Not on file  Social  Connections: Not on file  Intimate Partner Violence: Not on file    Current Outpatient Medications on File Prior to Visit  Medication Sig Dispense Refill  . atorvastatin (LIPITOR) 10 MG tablet Take 1 tablet (10 mg total) by mouth daily. 90 tablet 1  . azelastine (ASTELIN) 0.1 % nasal spray Place 2 sprays into both nostrils 2 (two) times daily. Use in each nostril as directed 30 mL 12  . BLACK COHOSH EXTRACT PO Take 1 tablet by mouth daily.    . cholecalciferol (VITAMIN D3) 25 MCG (1000 UNIT) tablet Take 1,000 Units by mouth daily.    . Cinnamon 500 MG capsule Take 1,000 mg by mouth 2 (two) times daily.    . cyclobenzaprine (FLEXERIL) 10 MG tablet Take 1 tablet (10 mg total) by mouth at bedtime as needed for muscle spasms. 90 tablet 0  . Echinacea-Goldenseal (ECHINACEA COMB/GOLDEN SEAL  PO) Take 1 capsule by mouth daily as needed (Allergies).    . empagliflozin (JARDIANCE) 25 MG TABS tablet Take 25 mg by mouth daily.    . fluocinonide cream (LIDEX) 4.31 % Apply 1 application topically 2 (two) times daily as needed (Itching).    . fluticasone (FLONASE) 50 MCG/ACT nasal spray Place 2 sprays into both nostrils as needed for allergies or rhinitis.    . furosemide (LASIX) 40 MG tablet Take 40 mg by mouth as needed for edema.    . gabapentin (NEURONTIN) 300 MG capsule Take 1 capsule (300 mg total) by mouth 2 (two) times daily. 180 capsule 3  . GLUCOSAMINE PO Take 1 tablet by mouth 2 (two) times daily.    Marland Kitchen levocetirizine (XYZAL) 5 MG tablet Take 1 tablet (5 mg total) by mouth every evening. 90 tablet 3  . losartan (COZAAR) 25 MG tablet Take 25 mg by mouth daily.    . meloxicam (MOBIC) 15 MG tablet Take 1 tablet (15 mg total) by mouth daily. 90 tablet 1  . metFORMIN (GLUCOPHAGE-XR) 500 MG 24 hr tablet Take 2 tablets (1,000 mg total) by mouth daily with breakfast. 180 tablet 3  . Multiple Vitamins-Minerals (WOMENS MULTIVITAMIN PO) Take by mouth daily.    . nitroGLYCERIN (NITRODUR - DOSED IN MG/24 HR) 0.2 mg/hr patch Place 0.2 mg onto the skin daily. Apply 1/4 patch daily as needed for tondonitis    . Omega-3 1000 MG CAPS Take 1 g by mouth daily.    Marland Kitchen omeprazole (PRILOSEC) 20 MG capsule Take 1 capsule (20 mg total) by mouth daily. 90 capsule 1  . vitamin B-12 (CYANOCOBALAMIN) 100 MCG tablet Take 100 mcg by mouth daily.    . vitamin C (ASCORBIC ACID) 500 MG tablet Take 1,000 mg by mouth daily.    . vitamin E 400 UNIT capsule Take 400 Units by mouth daily. Reported on 02/13/2016     No current facility-administered medications on file prior to visit.    Allergies  Allergen Reactions  . Penicillins Hives    Hives   . Tramadol Itching    Family History  Problem Relation Age of Onset  . Diabetes Father 49       Deceased  . Diabetes Mother        Living  . Hyperlipidemia Mother    . Tuberculosis Mother   . Asthma Mother   . Heart disease Maternal Uncle   . Colon cancer Maternal Uncle        dx in 55's  . Diabetes Brother   . Heart disease Maternal Aunt   .  Arthritis Other        Maternal Aunts & Uncles  . Diabetes Sister   . Kidney failure Brother 30       Diseased  . Healthy Son        x1  . Breast cancer Cousin 16  . Esophageal cancer Neg Hx   . Rectal cancer Neg Hx   . Stomach cancer Neg Hx     BP 136/84 (BP Location: Right Arm, Patient Position: Sitting, Cuff Size: Large)   Pulse 84   Ht 5' 5.25" (1.657 m)   Wt 229 lb 9.6 oz (104.1 kg)   SpO2 94%   BMI 37.92 kg/m    Review of Systems She denies hypoglycemia/n/v.      Objective:   Physical Exam VITAL SIGNS:  See vs page GENERAL: no distress Pulses: dorsalis pedis intact bilat.   MSK: no deformity of the feet CV: trace bilat leg edema Skin:  no ulcer on the feet.  normal color and temp on the feet. Neuro: sensation is intact to touch on the feet   Lab Results  Component Value Date   HGBA1C 7.1 (A) 04/20/2021       Assessment & Plan:  Insulin-requiring type 2 DM: uncertain etiology and prognosis.  At this A1c, we'll have to favor GLP rx over insulin.     Patient Instructions  I have sent a prescription to your pharmacy, to double the Trulicity, and:  Reduce the Tresiba to 50 units daily, and:  Please continue the same Jardiance.    check your blood sugar twice a day.  vary the time of day when you check, between before the 3 meals, and at bedtime.  also check if you have symptoms of your blood sugar being too high or too low.  please keep a record of the readings and bring it to your next appointment here (or you can bring the meter itself).  You can write it on any piece of paper.  please call us sooner if your blood sugar goes below 70, or if you have a lot of readings over 200.  Please come back for a follow-up appointment in 2 months.

## 2021-04-20 NOTE — Patient Instructions (Addendum)
I have sent a prescription to your pharmacy, to double the Trulicity, and:  Reduce the Tresiba to 50 units daily, and:  Please continue the same Jardiance.    check your blood sugar twice a day.  vary the time of day when you check, between before the 3 meals, and at bedtime.  also check if you have symptoms of your blood sugar being too high or too low.  please keep a record of the readings and bring it to your next appointment here (or you can bring the meter itself).  You can write it on any piece of paper.  please call us sooner if your blood sugar goes below 70, or if you have a lot of readings over 200.  Please come back for a follow-up appointment in 2 months.

## 2021-04-28 ENCOUNTER — Ambulatory Visit: Payer: PRIVATE HEALTH INSURANCE

## 2021-04-29 ENCOUNTER — Ambulatory Visit
Admission: RE | Admit: 2021-04-29 | Discharge: 2021-04-29 | Disposition: A | Discharge status: Home / Self Care | Payer: PRIVATE HEALTH INSURANCE | Source: Ambulatory Visit | Attending: Nurse Practitioner | Admitting: Nurse Practitioner

## 2021-04-29 ENCOUNTER — Other Ambulatory Visit: Payer: Self-pay

## 2021-04-29 DIAGNOSIS — Z1231 Encounter for screening mammogram for malignant neoplasm of breast: Secondary | ICD-10-CM

## 2021-05-18 ENCOUNTER — Other Ambulatory Visit: Payer: Self-pay

## 2021-05-18 ENCOUNTER — Encounter (INDEPENDENT_AMBULATORY_CARE_PROVIDER_SITE_OTHER): Payer: Self-pay | Admitting: Family Medicine

## 2021-05-18 ENCOUNTER — Ambulatory Visit (INDEPENDENT_AMBULATORY_CARE_PROVIDER_SITE_OTHER): Payer: PRIVATE HEALTH INSURANCE | Admitting: Family Medicine

## 2021-05-18 VITALS — BP 146/94 | HR 77 | Temp 98.0°F | Ht 64.0 in | Wt 221.0 lb

## 2021-05-18 DIAGNOSIS — E1169 Type 2 diabetes mellitus with other specified complication: Secondary | ICD-10-CM | POA: Diagnosis not present

## 2021-05-18 DIAGNOSIS — Z9189 Other specified personal risk factors, not elsewhere classified: Secondary | ICD-10-CM | POA: Diagnosis not present

## 2021-05-18 DIAGNOSIS — Z0289 Encounter for other administrative examinations: Secondary | ICD-10-CM

## 2021-05-18 DIAGNOSIS — E538 Deficiency of other specified B group vitamins: Secondary | ICD-10-CM | POA: Diagnosis not present

## 2021-05-18 DIAGNOSIS — I152 Hypertension secondary to endocrine disorders: Secondary | ICD-10-CM

## 2021-05-18 DIAGNOSIS — Z1331 Encounter for screening for depression: Secondary | ICD-10-CM | POA: Insufficient documentation

## 2021-05-18 DIAGNOSIS — R5383 Other fatigue: Secondary | ICD-10-CM

## 2021-05-18 DIAGNOSIS — Z6838 Body mass index (BMI) 38.0-38.9, adult: Secondary | ICD-10-CM

## 2021-05-18 DIAGNOSIS — G4733 Obstructive sleep apnea (adult) (pediatric): Secondary | ICD-10-CM | POA: Insufficient documentation

## 2021-05-18 DIAGNOSIS — Z794 Long term (current) use of insulin: Secondary | ICD-10-CM

## 2021-05-18 DIAGNOSIS — E1159 Type 2 diabetes mellitus with other circulatory complications: Secondary | ICD-10-CM | POA: Insufficient documentation

## 2021-05-18 DIAGNOSIS — R0602 Shortness of breath: Secondary | ICD-10-CM | POA: Insufficient documentation

## 2021-05-18 DIAGNOSIS — E785 Hyperlipidemia, unspecified: Secondary | ICD-10-CM

## 2021-05-18 HISTORY — DX: Other fatigue: R53.83

## 2021-05-19 LAB — VITAMIN B12: Vitamin B-12: 498 pg/mL (ref 232–1245)

## 2021-05-19 LAB — T4, FREE: Free T4: 1.35 ng/dL (ref 0.82–1.77)

## 2021-05-19 LAB — TSH: TSH: 1.6 u[IU]/mL (ref 0.450–4.500)

## 2021-05-19 LAB — FOLATE: Folate: 10.9 ng/mL (ref >3.0)

## 2021-05-25 ENCOUNTER — Other Ambulatory Visit: Payer: Self-pay | Admitting: Endocrinology

## 2021-05-25 ENCOUNTER — Other Ambulatory Visit: Payer: Self-pay | Admitting: Nurse Practitioner

## 2021-05-25 DIAGNOSIS — E119 Type 2 diabetes mellitus without complications: Secondary | ICD-10-CM

## 2021-05-25 DIAGNOSIS — M67911 Unspecified disorder of synovium and tendon, right shoulder: Secondary | ICD-10-CM

## 2021-05-25 NOTE — Progress Notes (Signed)
Dear Sharon Lacy, NP,   Thank you for referring [name] to our clinic. The following note includes my evaluation and treatment recommendations.  Chief Complaint:   [name] (MR# [id number]) is a 60 y.o. female who presents for evaluation and treatment of obesity and related comorbidities. Current BMI is Body mass index is 37.93 kg/m. [name] has been struggling with her weight for many years and has been unsuccessful in either losing weight, maintaining weight loss, or reaching her healthy weight goal.  [name] is currently in the action stage of change and ready to dedicate time achieving and maintaining a healthier weight. [name] is interested in becoming our patient and working on intensive lifestyle modifications including (but not limited to) diet and exercise for weight loss.  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  Lives with her step dad, [name], age 59.  Snacks on chips.  Craves steak, sandwiches, chips, especially late night snacking.  Skips breakfast and/or lunch 3-5 days per week.  Drinks caloric drinks - sodas (two 16.9 ounce Pepsi per day).  [name]'s habits were reviewed today and are as follows: Her family eats meals together, she thinks her family will eat healthier with her, her desired weight loss is 34 pounds, her heaviest weight ever was 230 pounds, she craves potato chips, steak, and sandwiches, she snacks frequently in the evenings, she skips breakfast and lunch frequently, she is frequently drinking liquids with calories, she frequently makes poor food choices, she frequently eats larger portions than normal, and she struggles with emotional eating.  Plan:  Recently had labs with PCP 03-09-2021 in chart with others ordered today.  Increase water intake as only at 2 bottles per day now.  Depression Screen Manju's Food and Mood (modified PHQ-9) score was 3.  Depression screen [facility] 2/9 05/18/2021  Decreased  Interest 0  Down, Depressed, Hopeless 0  PHQ - 2 Score 0  Altered sleeping 1  Tired, decreased energy 1  Change in appetite 0  Feeling bad or failure about yourself  1  Trouble concentrating 0  Moving slowly or fidgety/restless 0  Suicidal thoughts 0  PHQ-9 Score 3  Difficult doing work/chores Not difficult at all  Some recent data might be hidden   Assessment/Plan:   Orders Placed This Encounter  Procedures   Vitamin B12   Folate   T4, free   TSH   Medications Discontinued During This Encounter  Medication Reason   GLUCOSAMINE PO    1. Other fatigue [name] admits to daytime somnolence and denies waking up still tired. Patent has a history of symptoms of daytime fatigue and snoring. [name] generally gets 6 hours of sleep per night, and states that she has generally restful sleep. Snoring is present. Apneic episodes are not present. Epworth Sleepiness Score is 10.  Talayia does feel that her weight is causing her energy to be lower than it should be. Fatigue may be related to obesity, depression or many other causes. Labs will be ordered, and in the meanwhile, [name] will focus on self care including making healthy food choices, increasing physical activity and focusing on stress reduction.  Will check labs today.  - Folate - T4, free - TSH  2. SOBOE (shortness of breath on exertion) [name] Flow notes increasing shortness of breath with exercising and seems to be worsening over time with weight gain. She notes getting out of breath sooner with activity than she used to.  This has gotten worse recently. [name] denies shortness of breath at rest or orthopnea.  [name] does feel that she gets out of breath more easily that she used to when she exercises. [name]'s shortness of breath appears to be obesity related and exercise induced. She has agreed to work on weight loss and gradually increase exercise to treat her exercise induced shortness of breath. Will continue to monitor  closely.  Check IC today.   3. Type 2 diabetes mellitus with other specified complication, with long-term current use of insulin (HCC) Diabetes Mellitus: Not at goal. Medication: Trulicity 3 mg subcutaneously weekly, Jardiance 25 mg daily, Tresiba, metformin 1,000 mg daily, Onglyza 5 mg daily. Issues reviewed: blood sugar goals, complications of diabetes mellitus, hypoglycemia prevention and treatment, exercise, and nutrition. Followed by Dr. Loanne Drilling.  Last A1c 7.1.  Plan:  Labs recently done within the last 3 months or less. The importance of regular follow up with PCP and all other specialists as scheduled was stressed to patient today. The patient will continue to focus on protein-rich, low simple carbohydrate foods. We reviewed the importance of hydration, regular exercise for stress reduction, and restorative sleep.   Lab Results  Component Value Date   HGBA1C 7.1 (A) 04/20/2021   HGBA1C 8.7 (A) 02/10/2021   HGBA1C 6.2 (A) 06/22/2020   Lab Results  Component Value Date   MICROALBUR 1.1 03/09/2021   LDLCALC 83 03/09/2021   CREATININE 0.85 03/09/2021   4. Hypertension associated with type 2 diabetes mellitus (HCC) Medications: losartan 25 mg daily, Lasix 40 mg daily.   Plan:  Upper limits of normal.  Labs done less than 3 months ago.  Avoid buying foods that are: processed, frozen, or prepackaged to avoid excess salt. We will watch for signs of hypotension as she continues lifestyle modifications. We will continue to monitor closely alongside her PCP and/or Specialist.  Regular follow up with PCP and specialists was also encouraged.   BP Readings from Last 3 Encounters:  05/18/21 (!) 146/94  04/20/21 136/84  03/27/21 126/84   Lab Results  Component Value Date   CREATININE 0.85 03/09/2021   5. Hyperlipidemia associated with type 2 diabetes mellitus ([place]) Course: At goal. Lipid-lowering medications: Lipitor 10 mg daily.   Plan: Dietary changes: Increase soluble fiber, decrease  simple carbohydrates, decrease saturated fat. Exercise changes: Moderate to vigorous-intensity aerobic activity 150 minutes per week or as tolerated. We will continue to monitor along with PCP/specialists as it pertains to her weight loss journey.  Labs done less than 3 months ago.  Lab Results  Component Value Date   CHOL 163 03/09/2021   HDL 63.30 03/09/2021   LDLCALC 83 03/09/2021   TRIG 79.0 03/09/2021   CHOLHDL 3 03/09/2021   Lab Results  Component Value Date   ALT 12 03/09/2021   AST 15 03/09/2021   ALKPHOS 124 (H) 03/09/2021   BILITOT 0.4 03/09/2021   The 10-year ASCVD risk score Mikey Bussing DC Jr., et al., 2013) is: 18.2%   Values used to calculate the score:     Age: 39 years     Sex: Female     Is Non-Hispanic African American: Yes     Diabetic: Yes     Tobacco smoker: No     Systolic Blood Pressure: 240 mmHg     Is BP treated: Yes     HDL Cholesterol: 63.3 mg/dL     Total Cholesterol: 163 mg/dL  6. OSA (obstructive sleep apnea) Recently seen and evaluated by Sleep  Medicine - Dr. Harriet Masson of Cardiology.  Needs CPAP machine and will be picking up tomorrow and start using.  Plan:  treatment plan per Sleep Medicine.  Compliance is very important.  Education done.  OSA is a cause of systemic hypertension and is associated with an increased incidence of stroke, heart failure, atrial fibrillation, and coronary heart disease. Severe OSA increases all-cause mortality and  cardiovascular mortality.   Goal: Treatment of OSA via CPAP compliance and weight loss. Plasma ghrelin levels (appetite or "hunger hormone") are significantly higher in OSA patients than in BMI-matched controls, but decrease to levels similar to those of obese patients without OSA after CPAP treatment.  Weight loss improves OSA by several mechanisms, including reduction in fatty tissue in the throat (i.e. parapharyngeal fat) and the tongue. Loss of abdominal fat increases mediastinal traction on the upper airway making  it less likely to collapse during sleep. Studies have also shown that compliance with CPAP treatment improves leptin (hunger inhibitory hormone) imbalance.  7. Deficiency of vitamin B12 Lab Results  Component Value Date   VITAMINB12 498 05/18/2021   Supplementation: vitamin B12 100 mcg daily.   Plan:  Continue current treatment.  Check labs today.   - Vitamin B12  8. Depression screening Ladell was screened for depression as part of her new patient workup today.  PHQ-9 is 3.  Depression screening is negative.  9. At risk for impaired metabolic function Due to Ofelia's current state of health and medical condition(s), she is at a significantly higher risk for impaired metabolic function.   At least 23 minutes was spent on counseling Shanique about these concerns today.  This places the patient at a much greater risk to subsequently develop cardio-pulmonary conditions that can negatively affect the patient's quality of life.  I stressed the importance of reversing these risks factors.  The initial goal is to lose at least 5-10% of starting weight to help reduce risk factors.  Counseling:  Intensive lifestyle modifications discussed with Victoriah as the most appropriate first line treatment.  she will continue to work on diet, exercise, and weight loss efforts.  We will continue to reassess these conditions on a fairly regular basis in an attempt to decrease the patient's overall morbidity and mortality.  10. Obesity, current BMI 38.1  Brantley is currently in the action stage of change and her goal is to continue with weight loss efforts. I recommend Juda begin the structured treatment plan as follows:  She has agreed to the Category 2 Plan.  Exercise goals:  As is.    Behavioral modification strategies: decreasing simple carbohydrates, increasing water intake, decreasing liquid calories, no skipping meals, meal planning and cooking strategies, better snacking choices, and planning for  success.  She was informed of the importance of frequent follow-up visits to maximize her success with intensive lifestyle modifications for her multiple health conditions. She was informed we would discuss her lab results at her next visit unless there is a critical issue that needs to be addressed sooner. Maydelin agreed to keep her next visit at the agreed upon time to discuss these results.  Objective:   Blood pressure (!) 146/94, pulse 77, temperature 98 F (36.7 C), height 5\' 4"  (1.626 m), weight 221 lb (100.2 kg), SpO2 98 %. Body mass index is 37.93 kg/m.  EKG: Not performed.  Indirect Calorimeter completed today shows a VO2 of 253 and a REE of 1760.  Her calculated basal metabolic rate is 6283 thus her basal metabolic rate is  better than expected.  General: Cooperative, alert, well developed, in no acute distress. HEENT: Conjunctivae and lids unremarkable. Cardiovascular: Regular rhythm.  Lungs: Normal work of breathing. Neurologic: No focal deficits.   Lab Results  Component Value Date   CREATININE 0.85 03/09/2021   BUN 13 03/09/2021   NA 140 03/09/2021   K 3.7 03/09/2021   CL 105 03/09/2021   CO2 28 03/09/2021   Lab Results  Component Value Date   ALT 12 03/09/2021   AST 15 03/09/2021   ALKPHOS 124 (H) 03/09/2021   BILITOT 0.4 03/09/2021   Lab Results  Component Value Date   HGBA1C 7.1 (A) 04/20/2021   HGBA1C 8.7 (A) 02/10/2021   HGBA1C 6.2 (A) 06/22/2020   HGBA1C 7.3 (A) 04/22/2020   HGBA1C 7.1 (A) 01/15/2020   Lab Results  Component Value Date   TSH 1.600 05/18/2021   Lab Results  Component Value Date   CHOL 163 03/09/2021   HDL 63.30 03/09/2021   LDLCALC 83 03/09/2021   TRIG 79.0 03/09/2021   CHOLHDL 3 03/09/2021   Lab Results  Component Value Date   WBC 4.3 03/09/2021   HGB 13.0 03/09/2021   HCT 39.8 03/09/2021   MCV 88.8 03/09/2021   PLT 276.0 03/09/2021   Attestation Statements:   This is the patient's first visit at [facility]. The patient's NEW PATIENT PACKET was reviewed at length. Included in the packet: current and past health history, medications, allergies, ROS, gynecologic history (women only), surgical history, family history, social history, weight history, weight loss surgery history (for those that have had weight loss surgery), nutritional evaluation, mood and food questionnaire, PHQ9, Epworth questionnaire, sleep habits questionnaire, patient life and health improvement goals questionnaire. These will all be scanned into the patient's chart under media.   During the visit, I independently reviewed the patient's EKG, bioimpedance scale results, and indirect calorimeter results. I used this information to tailor a meal plan for the patient that will help her to lose weight and will improve her obesity-related conditions going forward. I performed a medically necessary appropriate examination and/or evaluation. I discussed the assessment and treatment plan with the patient. The patient was provided an opportunity to ask questions and all were answered. The patient agreed with the plan and demonstrated an understanding of the instructions. Labs were ordered at this visit and will be reviewed at the next visit unless more critical results need to be addressed immediately. Clinical information was updated and documented in the EMR.   I, Water quality scientist, CMA, am acting as Location manager for [employer], DO.  I have reviewed the above documentation for accuracy and completeness, and I agree with the above. Marjory Sneddon, D.O.  The [place] was signed into law in 2016 which includes the topic of electronic health records.  This provides immediate access to information in MyChart.  This includes consultation notes, operative notes, office notes, lab results and pathology reports.  If you have any questions about what you read please let us know at your next visit so we can discuss your concerns  and take corrective action if need be.  We are right here with you.

## 2021-06-01 ENCOUNTER — Encounter (INDEPENDENT_AMBULATORY_CARE_PROVIDER_SITE_OTHER): Payer: Self-pay | Admitting: Family Medicine

## 2021-06-01 ENCOUNTER — Other Ambulatory Visit: Payer: Self-pay

## 2021-06-01 ENCOUNTER — Ambulatory Visit (INDEPENDENT_AMBULATORY_CARE_PROVIDER_SITE_OTHER): Payer: PRIVATE HEALTH INSURANCE | Admitting: Family Medicine

## 2021-06-01 VITALS — BP 140/88 | HR 71 | Temp 98.2°F | Ht 64.0 in | Wt 219.0 lb

## 2021-06-01 DIAGNOSIS — E1159 Type 2 diabetes mellitus with other circulatory complications: Secondary | ICD-10-CM

## 2021-06-01 DIAGNOSIS — Z6837 Body mass index (BMI) 37.0-37.9, adult: Secondary | ICD-10-CM

## 2021-06-01 DIAGNOSIS — E119 Type 2 diabetes mellitus without complications: Secondary | ICD-10-CM | POA: Insufficient documentation

## 2021-06-01 DIAGNOSIS — E785 Hyperlipidemia, unspecified: Secondary | ICD-10-CM

## 2021-06-01 DIAGNOSIS — Z9189 Other specified personal risk factors, not elsewhere classified: Secondary | ICD-10-CM | POA: Diagnosis not present

## 2021-06-01 DIAGNOSIS — Z794 Long term (current) use of insulin: Secondary | ICD-10-CM

## 2021-06-01 DIAGNOSIS — E1169 Type 2 diabetes mellitus with other specified complication: Secondary | ICD-10-CM | POA: Diagnosis not present

## 2021-06-01 DIAGNOSIS — I152 Hypertension secondary to endocrine disorders: Secondary | ICD-10-CM

## 2021-06-01 MED ORDER — LOSARTAN POTASSIUM 50 MG PO TABS: 50.0000 mg | ORAL_TABLET | Freq: Every day | ORAL | 0 refills | Status: DC

## 2021-06-08 ENCOUNTER — Other Ambulatory Visit: Payer: Self-pay | Admitting: Endocrinology

## 2021-06-08 NOTE — Progress Notes (Signed)
Chief Complaint:   OBESITY [name] is here to discuss her progress with her obesity treatment plan along with follow-up of her obesity related diagnoses.   Today's visit was #: 2 Starting weight: 221 lbs Starting date: 05/18/2021 Today's weight: 219 lbs Today's date: 06/01/2021 Weight change since last visit: 2 lbs Total lbs lost to date: 2 lbs Body mass index is 37.59 kg/m.  Total weight loss percentage to date: -0.90%  Interim History:  [name] is here today for her first follow-up office visit since starting the program with [place].  All blood work/ lab tests that were recently ordered by myself or an outside provider were reviewed with patient today per their request.   Extended time was spent counseling her on all new disease processes that were discovered or preexisting ones that are worsening.  she understands that many of these abnormalities will need to monitored regularly along with the current treatment plan of prudent dietary changes, in which we are making each and every office visit, to improve these health parameters.  [name] went to [facility] this weekend and ate poorly.  Difficult to follow the plan and not at [facility] or cereal or other foods in the pantry.  Current Meal Plan: the Category 2 Plan for 30-50% of the time.  Current Exercise Plan: None. Current Anti-Obesity Medications: Trulicity 3 mg subcutaneously weekly. Side effects: None.  Assessment/Plan:   Medications Discontinued During This Encounter  Medication Reason   saxagliptin HCl (ONGLYZA) 5 MG TABS tablet    losartan (COZAAR) 25 MG tablet     Meds ordered this encounter  Medications   losartan (COZAAR) 50 MG tablet    Sig: Take 1 tablet (50 mg total) by mouth daily.    Dispense:  30 tablet    Refill:  0    30 d supply - ov for rf    1. Type 2 diabetes mellitus with other specified complication, with long-term current use of insulin (HCC) Diabetes Mellitus: Not at goal.  Medication: Tresiba 50 units daily, Jardiance 25 mg daily, Trulicity 3 mg subcutaneously weekly, and metformin XR 1,000 mg daily. Issues reviewed: blood sugar goals, complications of diabetes mellitus, hypoglycemia prevention and treatment, exercise, and nutrition.  FBS not over 180.  Mostly 150s.  No lows or concerns.  A1c 7.1.  Plan:  Discussed labs with patient today.  Not at goal of less than 7.0.  Continue prudent nutritional plan and weight loss. The importance of regular follow up with PCP and all other specialists as scheduled was stressed to patient today. The patient will continue to focus on protein-rich, low simple carbohydrate foods. We reviewed the importance of hydration, regular exercise for stress reduction, and restorative sleep.   Lab Results  Component Value Date   HGBA1C 7.1 (A) 04/20/2021   HGBA1C 8.7 (A) 02/10/2021   HGBA1C 6.2 (A) 06/22/2020   Lab Results  Component Value Date   MICROALBUR 1.1 03/09/2021   LDLCALC 83 03/09/2021   CREATININE 0.85 03/09/2021   2. Hypertension associated with type 2 diabetes mellitus ([place]) Not at goal. Medications: Lasix 40 mg daily, Cozaar 50 mg daly. She did not take her blood pressure medication yet today.  Serum creatinine within normal limits.  Blood pressure elevated for 2 consecutive visits.  Does not check at home.  Plan:  Increase losartan from 25 to 50 mg.  Goal blood pressure is less than 130/80.  Check at home and bring in log.  Avoid buying foods that are: processed, frozen, or prepackaged to avoid excess salt.  Continue prudent nutritional plan and weight loss. We will continue to monitor closely alongside her PCP and/or Specialist.  Regular follow up with PCP and specialists was also encouraged.   BP Readings from Last 3 Encounters:  06/01/21 140/88  05/18/21 (!) 146/94  04/20/21 136/84   Lab Results  Component Value Date   CREATININE 0.85 03/09/2021   - Increase and refill losartan (COZAAR) 50 MG tablet; Take 1  tablet (50 mg total) by mouth daily.  Dispense: 30 tablet; Refill: 0  3. Hyperlipidemia associated with type 2 diabetes mellitus ([place]) Course: At goal. Lipid-lowering medications: Lipitor 10 mg daily.   Plan: Dietary changes: Increase soluble fiber, decrease simple carbohydrates, decrease saturated fat. Exercise changes: Moderate to vigorous-intensity aerobic activity 150 minutes per week or as tolerated. We will continue to monitor along with PCP/specialists as it pertains to her weight loss journey.  Lab Results  Component Value Date   CHOL 163 03/09/2021   HDL 63.30 03/09/2021   LDLCALC 83 03/09/2021   TRIG 79.0 03/09/2021   CHOLHDL 3 03/09/2021   Lab Results  Component Value Date   ALT 12 03/09/2021   AST 15 03/09/2021   ALKPHOS 124 (H) 03/09/2021   BILITOT 0.4 03/09/2021   The 10-year ASCVD risk score Mikey Bussing DC Jr., et al., 2013) is: 16.3%   Values used to calculate the score:     Age: 60 years     Sex: Female     Is Non-Hispanic African American: Yes     Diabetic: Yes     Tobacco smoker: No     Systolic Blood Pressure: 174 mmHg     Is BP treated: Yes     HDL Cholesterol: 63.3 mg/dL     Total Cholesterol: 163 mg/dL  4. At risk for heart disease Due to Francile's current state of health and medical condition(s), she is at a higher risk for heart disease.  This puts the patient at much greater risk to subsequently develop cardiopulmonary conditions that can significantly affect patient's quality of life in a negative manner.    At least 23 minutes were spent on counseling [name] about these concerns today, and I stressed the importance of reversing risks factors of obesity, especially truncal and visceral fat, hypertension, hyperlipidemia, and pre-diabetes.  The initial goal is to lose at least 5-10% of starting weight to help reduce these risk factors.  Counseling:  Intensive lifestyle modifications were discussed with [name] as the most appropriate first line of treatment.   she will continue to work on diet, exercise, and weight loss efforts.  We will continue to reassess these conditions on a fairly regular basis in an attempt to decrease the patient's overall morbidity and mortality.  Evidence-based interventions for health behavior change were utilized today including the discussion of self monitoring techniques, problem-solving barriers, and SMART goal setting techniques.  Specifically, regarding patient's less desirable eating habits and patterns, we employed the technique of small changes when [name] has not been able to fully commit to her prudent nutritional plan.  5. Obesity, current BMI 37.7  Course: [name] is currently in the action stage of change. As such, her goal is to continue with weight loss efforts.   Nutrition goals: She has agreed to the Category 2 Plan.   Exercise goals:  As is.  Behavioral modification strategies: increasing lean protein intake, decreasing simple carbohydrates, meal planning and cooking strategies, and planning for  success.  [name] has agreed to follow-up with our clinic in 2-3 weeks. She was informed of the importance of frequent follow-up visits to maximize her success with intensive lifestyle modifications for her multiple health conditions.   Objective:   Blood pressure 140/88, pulse 71, temperature 98.2 F (36.8 C), height 5\' 4"  (1.626 m), weight 219 lb (99.3 kg), SpO2 98 %. Body mass index is 37.59 kg/m.  General: Cooperative, alert, well developed, in no acute distress. HEENT: Conjunctivae and lids unremarkable. Cardiovascular: Regular rhythm.  Lungs: Normal work of breathing. Neurologic: No focal deficits.   Lab Results  Component Value Date   CREATININE 0.85 03/09/2021   BUN 13 03/09/2021   NA 140 03/09/2021   K 3.7 03/09/2021   CL 105 03/09/2021   CO2 28 03/09/2021   Lab Results  Component Value Date   ALT 12 03/09/2021   AST 15 03/09/2021   ALKPHOS 124 (H) 03/09/2021   BILITOT 0.4  03/09/2021   Lab Results  Component Value Date   HGBA1C 7.1 (A) 04/20/2021   HGBA1C 8.7 (A) 02/10/2021   HGBA1C 6.2 (A) 06/22/2020   HGBA1C 7.3 (A) 04/22/2020   HGBA1C 7.1 (A) 01/15/2020   Lab Results  Component Value Date   TSH 1.600 05/18/2021   Lab Results  Component Value Date   CHOL 163 03/09/2021   HDL 63.30 03/09/2021   LDLCALC 83 03/09/2021   TRIG 79.0 03/09/2021   CHOLHDL 3 03/09/2021   Lab Results  Component Value Date   VD25OH 12.06 (L) 05/04/2020   Lab Results  Component Value Date   WBC 4.3 03/09/2021   HGB 13.0 03/09/2021   HCT 39.8 03/09/2021   MCV 88.8 03/09/2021   PLT 276.0 03/09/2021   Attestation Statements:   Reviewed by clinician on day of visit: allergies, medications, problem list, medical history, surgical history, family history, social history, and previous encounter notes.  I, Water quality scientist, CMA, am acting as Location manager for [employer], DO.  I have reviewed the above documentation for accuracy and completeness, and I agree with the above. [name], D.O.  The [place] was signed into law in 2016 which includes the topic of electronic health records.  This provides immediate access to information in MyChart.  This includes consultation notes, operative notes, office notes, lab results and pathology reports.  If you have any questions about what you read please let us know at your next visit so we can discuss your concerns and take corrective action if need be.  We are right here with you.

## 2021-06-19 ENCOUNTER — Other Ambulatory Visit: Payer: Self-pay

## 2021-06-19 ENCOUNTER — Ambulatory Visit (INDEPENDENT_AMBULATORY_CARE_PROVIDER_SITE_OTHER): Payer: PRIVATE HEALTH INSURANCE | Admitting: Family Medicine

## 2021-06-19 VITALS — BP 117/70 | HR 89 | Temp 98.2°F | Ht 64.0 in | Wt 214.0 lb

## 2021-06-19 DIAGNOSIS — Z6837 Body mass index (BMI) 37.0-37.9, adult: Secondary | ICD-10-CM

## 2021-06-19 DIAGNOSIS — Z9189 Other specified personal risk factors, not elsewhere classified: Secondary | ICD-10-CM | POA: Diagnosis not present

## 2021-06-19 DIAGNOSIS — I152 Hypertension secondary to endocrine disorders: Secondary | ICD-10-CM

## 2021-06-19 DIAGNOSIS — G4733 Obstructive sleep apnea (adult) (pediatric): Secondary | ICD-10-CM

## 2021-06-19 DIAGNOSIS — E1159 Type 2 diabetes mellitus with other circulatory complications: Secondary | ICD-10-CM

## 2021-06-19 DIAGNOSIS — E785 Hyperlipidemia, unspecified: Secondary | ICD-10-CM | POA: Diagnosis not present

## 2021-06-19 DIAGNOSIS — E66812 Obesity, class 2: Secondary | ICD-10-CM

## 2021-06-19 MED ORDER — ATORVASTATIN CALCIUM 20 MG PO TABS: 20.0000 mg | ORAL_TABLET | Freq: Every evening | ORAL | 0 refills | Status: DC | PRN

## 2021-06-19 MED ORDER — LOSARTAN POTASSIUM 50 MG PO TABS: 50.0000 mg | ORAL_TABLET | Freq: Every day | ORAL | 0 refills | Status: DC

## 2021-06-22 ENCOUNTER — Other Ambulatory Visit (INDEPENDENT_AMBULATORY_CARE_PROVIDER_SITE_OTHER): Payer: Self-pay | Admitting: Family Medicine

## 2021-06-22 DIAGNOSIS — E785 Hyperlipidemia, unspecified: Secondary | ICD-10-CM

## 2021-06-23 ENCOUNTER — Other Ambulatory Visit: Payer: Self-pay | Admitting: Endocrinology

## 2021-06-25 ENCOUNTER — Other Ambulatory Visit (INDEPENDENT_AMBULATORY_CARE_PROVIDER_SITE_OTHER): Payer: Self-pay | Admitting: Family Medicine

## 2021-06-25 DIAGNOSIS — E1159 Type 2 diabetes mellitus with other circulatory complications: Secondary | ICD-10-CM

## 2021-06-26 NOTE — Telephone Encounter (Signed)
Pt last seen by Dr. Opalski.  

## 2021-06-29 ENCOUNTER — Ambulatory Visit: Payer: PRIVATE HEALTH INSURANCE | Admitting: Endocrinology

## 2021-07-03 ENCOUNTER — Ambulatory Visit: Payer: PRIVATE HEALTH INSURANCE | Admitting: Endocrinology

## 2021-07-03 NOTE — Progress Notes (Signed)
Chief Complaint:   OBESITY [name] is here to discuss [name] progress with [name] obesity treatment plan along with follow-up of [name] obesity related diagnoses.   Today's visit was #: 3 Starting weight: 221 lbs Starting date: 05/18/2021 Today's weight: 214 lbs Today's date: 06/19/2021 Weight change since last visit: 5 lbs Total lbs lost to date: 7 lbs Body mass index is 36.73 kg/m.  Total weight loss percentage to date: -3.17%  Interim History:  [name] is doing better with the meal plan and following our recommendations.  No issues with the plan.  Ate out 4 times over the past weekend.  Tried to get meat and a vegetable and avoid bread and potatoes.  Going to [place] for 7 days from July 27th through August 2nd or so.  Current Meal Plan: the Category 2 Plan for 70% of the time.  Current Exercise Plan: Walking for 25+ minutes 2 times since last visit.  Assessment/Plan:   Medications Discontinued During This Encounter  Medication Reason   atorvastatin (LIPITOR) 10 MG tablet    losartan (COZAAR) 50 MG tablet Reorder   Meds ordered this encounter  Medications   losartan (COZAAR) 50 MG tablet    Sig: Take 1 tablet (50 mg total) by mouth daily.    Dispense:  30 tablet    Refill:  0    30 d supply - ov for rf   atorvastatin (LIPITOR) 20 MG tablet    Sig: Take 1 tablet (20 mg total) by mouth at bedtime as needed.    Dispense:  30 tablet    Refill:  0    1. Hypertension associated with type 2 diabetes mellitus ([place]) Not at goal. Medications: Lasix 40 mg daily, Cozaar 50 mg daily.  Blood pressure at home 120/80, 121/78, 128/86.  No symptoms or concerns.  Tolerating the increase in losartan from last office visit well.  Plan: Avoid buying foods that are: processed, frozen, or prepackaged to avoid excess salt. We will watch for signs of hypotension as [name] continues lifestyle modifications. We will continue to monitor closely alongside [name] PCP and/or Specialist.  Regular follow up with  PCP and specialists was also encouraged.  Refill losartan.  Recheck CMP in 2 months.  Continue home blood pressure monitoring every 2-3 days or so.  BP Readings from Last 3 Encounters:  06/19/21 117/70  06/01/21 140/88  05/18/21 (!) 146/94   Lab Results  Component Value Date   CREATININE 0.85 03/09/2021   - Refill losartan (COZAAR) 50 MG tablet; Take 1 tablet (50 mg total) by mouth daily.  Dispense: 30 tablet; Refill: 0  2. Hyperlipidemia, unspecified hyperlipidemia type Course: At goal. Lipid-lowering medications: Lipitor 20 mg daily.  Last office visit LDL was 83 and not quite at goal of less than 70.  Plan: Dietary changes: Increase soluble fiber, decrease simple carbohydrates, decrease saturated fat. Exercise changes: Moderate to vigorous-intensity aerobic activity 150 minutes per week or as tolerated. We will continue to monitor along with PCP/specialists as it pertains to [name] weight loss journey.  Increase Lipitor to 20 mg (up from 10 mg).  Recheck CMP in 2-3 months.  New script given.  Lab Results  Component Value Date   CHOL 163 03/09/2021   HDL 63.30 03/09/2021   LDLCALC 83 03/09/2021   TRIG 79.0 03/09/2021   CHOLHDL 3 03/09/2021   Lab Results  Component Value Date   ALT 12 03/09/2021   AST 15 03/09/2021   ALKPHOS 124 (H) 03/09/2021  BILITOT 0.4 03/09/2021   The 10-year ASCVD risk score Mikey Bussing DC Jr., et al., 2013) is: 10.1%   Values used to calculate the score:     Age: 60 years     Sex: Female     Is Non-Hispanic African American: Yes     Diabetic: Yes     Tobacco smoker: No     Systolic Blood Pressure: 123XX123 mmHg     Is BP treated: Yes     HDL Cholesterol: 63.3 mg/dL     Total Cholesterol: 163 mg/dL  - Increase and refill atorvastatin (LIPITOR) 20 MG tablet; Take 1 tablet (20 mg total) by mouth at bedtime as needed.  Dispense: 30 tablet; Refill: 0  3. OSA (obstructive sleep apnea) Started CPAP recently (July 1).  Sleeping better.  More energy during the  day.  Plan:  Continue mask per Sleep Medicine.  Importance of nightly use discussed with patient.    OSA is a cause of systemic hypertension and is associated with an increased incidence of stroke, heart failure, atrial fibrillation, and coronary heart disease. Severe OSA increases all-cause mortality and cardiovascular mortality.   Goal: Treatment of OSA via CPAP compliance and weight loss. Plasma ghrelin levels (appetite or "hunger hormone") are significantly higher in OSA patients than in BMI-matched controls, but decrease to levels similar to those of obese patients without OSA after CPAP treatment.  Weight loss improves OSA by several mechanisms, including reduction in fatty tissue in the throat (i.e. parapharyngeal fat) and the tongue. Loss of abdominal fat increases mediastinal traction on the upper airway making it less likely to collapse during sleep. Studies have also shown that compliance with CPAP treatment improves leptin (hunger inhibitory hormone) imbalance.  4. At risk for heart disease Due to [name]'s current state of health and medical condition(s), [name] is at a higher risk for heart disease.  This puts the patient at much greater risk to subsequently develop cardiopulmonary conditions that can significantly affect patient's quality of life in a negative manner.    At least 10 minutes were spent on counseling [name] about these concerns today, and I stressed the importance of reversing risks factors of obesity, especially truncal and visceral fat, hypertension, hyperlipidemia, and pre-diabetes.  The initial goal is to lose at least 5-10% of starting weight to help reduce these risk factors.  Counseling:  Intensive lifestyle modifications were discussed with [name] as the most appropriate first line of treatment.  [name] will continue to work on diet, exercise, and weight loss efforts.  We will continue to reassess these conditions on a fairly regular basis in an attempt to decrease the  patient's overall morbidity and mortality.  Evidence-based interventions for health behavior change were utilized today including the discussion of self monitoring techniques, problem-solving barriers, and SMART goal setting techniques.  Specifically, regarding patient's less desirable eating habits and patterns, we employed the technique of small changes when [name] has not been able to fully commit to [name] prudent nutritional plan.  5. Obesity BMI today 36  Course: [name] is currently in the action stage of change. As such, [name] goal is to continue with weight loss efforts.   Nutrition goals: [name] has agreed to the Category 2 Plan with breakfast options.   Exercise goals:  Walking 20-30 minutes 2-3 days per week at as slow of a pace as tolerated.  Behavioral modification strategies: increasing water intake, decreasing eating out, and travel eating strategies.  [place] has agreed to follow-up with our clinic in 2-3  weeks, when [name] returns from vacation. [name] was informed of the importance of frequent follow-up visits to maximize [name] success with intensive lifestyle modifications for [name] multiple health conditions.   Objective:   Blood pressure 117/70, pulse 89, temperature 98.2 F (36.8 C), height '5\' 4"'$  (1.626 m), weight 214 lb (97.1 kg), SpO2 98 %. Body mass index is 36.73 kg/m.  General: Cooperative, alert, well developed, in no acute distress. HEENT: Conjunctivae and lids unremarkable. Cardiovascular: Regular rhythm.  Lungs: Normal work of breathing. Neurologic: No focal deficits.   Lab Results  Component Value Date   CREATININE 0.85 03/09/2021   BUN 13 03/09/2021   NA 140 03/09/2021   K 3.7 03/09/2021   CL 105 03/09/2021   CO2 28 03/09/2021   Lab Results  Component Value Date   ALT 12 03/09/2021   AST 15 03/09/2021   ALKPHOS 124 (H) 03/09/2021   BILITOT 0.4 03/09/2021   Lab Results  Component Value Date   HGBA1C 7.1 (A) 04/20/2021   HGBA1C 8.7 (A) 02/10/2021    HGBA1C 6.2 (A) 06/22/2020   HGBA1C 7.3 (A) 04/22/2020   HGBA1C 7.1 (A) 01/15/2020   Lab Results  Component Value Date   TSH 1.600 05/18/2021   Lab Results  Component Value Date   CHOL 163 03/09/2021   HDL 63.30 03/09/2021   LDLCALC 83 03/09/2021   TRIG 79.0 03/09/2021   CHOLHDL 3 03/09/2021   Lab Results  Component Value Date   VD25OH 12.06 (L) 05/04/2020   Lab Results  Component Value Date   WBC 4.3 03/09/2021   HGB 13.0 03/09/2021   HCT 39.8 03/09/2021   MCV 88.8 03/09/2021   PLT 276.0 03/09/2021   Attestation Statements:   Reviewed by clinician on day of visit: allergies, medications, problem list, medical history, surgical history, family history, social history, and previous encounter notes.  I, Water quality scientist, CMA, am acting as Location manager for [employer], DO.  I have reviewed the above documentation for accuracy and completeness, and I agree with the above. Marjory Sneddon, D.O.  The [place] was signed into law in 2016 which includes the topic of electronic health records.  This provides immediate access to information in MyChart.  This includes consultation notes, operative notes, office notes, lab results and pathology reports.  If you have any questions about what you read please let us know at your next visit so we can discuss your concerns and take corrective action if need be.  We are right here with you.

## 2021-07-12 ENCOUNTER — Ambulatory Visit (INDEPENDENT_AMBULATORY_CARE_PROVIDER_SITE_OTHER): Payer: PRIVATE HEALTH INSURANCE | Admitting: Family Medicine

## 2021-07-12 ENCOUNTER — Encounter (INDEPENDENT_AMBULATORY_CARE_PROVIDER_SITE_OTHER): Payer: Self-pay | Admitting: Family Medicine

## 2021-07-12 ENCOUNTER — Other Ambulatory Visit: Payer: Self-pay

## 2021-07-12 VITALS — BP 148/95 | HR 75 | Temp 98.0°F | Ht 64.0 in | Wt 220.0 lb

## 2021-07-12 DIAGNOSIS — Z9189 Other specified personal risk factors, not elsewhere classified: Secondary | ICD-10-CM

## 2021-07-12 DIAGNOSIS — E1159 Type 2 diabetes mellitus with other circulatory complications: Secondary | ICD-10-CM | POA: Diagnosis not present

## 2021-07-12 DIAGNOSIS — I152 Hypertension secondary to endocrine disorders: Secondary | ICD-10-CM

## 2021-07-12 DIAGNOSIS — Z6837 Body mass index (BMI) 37.0-37.9, adult: Secondary | ICD-10-CM

## 2021-07-12 DIAGNOSIS — E785 Hyperlipidemia, unspecified: Secondary | ICD-10-CM

## 2021-07-12 MED ORDER — LOSARTAN POTASSIUM 50 MG PO TABS: 50.0000 mg | ORAL_TABLET | Freq: Every day | ORAL | 0 refills | Status: DC

## 2021-07-12 MED ORDER — ATORVASTATIN CALCIUM 20 MG PO TABS: 20.0000 mg | ORAL_TABLET | Freq: Every evening | ORAL | 0 refills | Status: DC | PRN

## 2021-07-17 NOTE — Progress Notes (Signed)
Chief Complaint:   OBESITY [name] is here to discuss her progress with her obesity treatment plan along with follow-up of her obesity related diagnoses. [name] is on the Category 2 Plan with breakfast options and states she is following her eating plan approximately 80% of the time. [name] states she is walking for 25 minutes 2 times per week.  Today's visit was #: 4 Starting weight: 221 lbs Starting date: 05/18/2021 Today's weight: 220 lbs Today's date: 07/12/2021 Total lbs lost to date: 1 Total lbs lost since last in-office visit: 0  Interim History: [name] was on vacation for 1 week in [place]. She didn't follow her meal plan as closely then.  Subjective:   1. Hypertension associated with type 2 diabetes mellitus ([name]) [name] increase her losartan to 50 mg. She was last seen by Cardiology on 03/27/2021.   2. Hyperlipidemia, unspecified hyperlipidemia type [name] has hyperlipidemia and has been trying to improve her cholesterol levels with intensive lifestyle modification including a low saturated fat diet, exercise and weight loss. She denies any chest pain, claudication or myalgias.  3. At risk for dehydration [name] is at risk for dehydration due to inadequate water intake.  Assessment/Plan:  No orders of the defined types were placed in this encounter.   Medications Discontinued During This Encounter  Medication Reason   TRULICITY [id number] SOPN Change in therapy   losartan (COZAAR) 50 MG tablet Reorder   atorvastatin (LIPITOR) 20 MG tablet Reorder     Meds ordered this encounter  Medications   atorvastatin (LIPITOR) 20 MG tablet    Sig: Take 1 tablet (20 mg total) by mouth at bedtime as needed.    Dispense:  30 tablet    Refill:  0    No further RF's- will get from cards or PCP   losartan (COZAAR) 50 MG tablet    Sig: Take 1 tablet (50 mg total) by mouth daily.    Dispense:  30 tablet    Refill:  0    30 d supply - ov for rf     1. Hypertension  associated with type 2 diabetes mellitus ([place]) [name]'s blood pressure is not at goal. She will check her blood pressure at home every day and bring in her log for her next office visit. She will continue working on healthy weight loss and exercise to improve blood pressure control. We will refill losartan for 1 month, and will watch for signs of hypotension as she continues her lifestyle modifications.  - losartan (COZAAR) 50 MG tablet; Take 1 tablet (50 mg total) by mouth daily.  Dispense: 30 tablet; Refill: 0  2. Hyperlipidemia, unspecified hyperlipidemia type Cardiovascular risk and specific lipid/LDL goals reviewed. We discussed several lifestyle modifications today. [name] will continue to work on diet, exercise and weight loss efforts. We will refill Lipitor for 1 month. Orders and follow up as documented in patient record.   Counseling Intensive lifestyle modifications are the first line treatment for this issue. Dietary changes: Increase soluble fiber. Decrease simple carbohydrates. Exercise changes: Moderate to vigorous-intensity aerobic activity 150 minutes per week if tolerated. Lipid-lowering medications: see documented in medical record.  - atorvastatin (LIPITOR) 20 MG tablet; Take 1 tablet (20 mg total) by mouth at bedtime as needed.  Dispense: 30 tablet; Refill: 0  3. At risk for dehydration [name] is at higher than average risk of dehydration. [name] was given more than 9 minutes of proper hydration counseling today.  We discussed the signs and symptoms  of dehydration, some of which may include muscle cramping, constipation or even orthostatic symptoms.  Counseling on the prevention of dehydration was also provided today.  [name] is at risk for dehydration due to weight loss, lifestyle and behavorial habits and possibly due to taking certain medication(s).  She was encouraged to adequately hydrate and monitor fluid status to avoid dehydration as well as weight loss plateaus.   Unless pre-existing renal or cardiopulmonary conditions exist, in which patient was told to limit their fluid intake, I recommended roughly one half of their weight in pounds to be the approximate ounces of non-caloric, non-caffeinated beverages they should drink per day; including more if they are engaging in exercise.  4. Obesity with current BMI of 37.8 [name] is currently in the action stage of change. As such, her goal is to continue with weight loss efforts. She has agreed to the Category 2 Plan with breakfast options.   [name] will decrease salt, check her blood pressure, follow her prudent nutritional plan, and will need close follow up.  Exercise goals: As is.  Behavioral modification strategies: increasing lean protein intake, decreasing simple carbohydrates, and increasing water intake.  [name] has agreed to follow-up with our clinic in 2 weeks. She was informed of the importance of frequent follow-up visits to maximize her success with intensive lifestyle modifications for her multiple health conditions.   Objective:   Blood pressure (!) 148/95, pulse 75, temperature 98 F (36.7 C), height '5\' 4"'$  (1.626 m), weight 220 lb (99.8 kg), SpO2 96 %. Body mass index is 37.76 kg/m.  General: Cooperative, alert, well developed, in no acute distress. HEENT: Conjunctivae and lids unremarkable. Cardiovascular: Regular rhythm.  Lungs: Normal work of breathing. Neurologic: No focal deficits.   Lab Results  Component Value Date   CREATININE 0.85 03/09/2021   BUN 13 03/09/2021   NA 140 03/09/2021   K 3.7 03/09/2021   CL 105 03/09/2021   CO2 28 03/09/2021   Lab Results  Component Value Date   ALT 12 03/09/2021   AST 15 03/09/2021   ALKPHOS 124 (H) 03/09/2021   BILITOT 0.4 03/09/2021   Lab Results  Component Value Date   HGBA1C 7.1 (A) 04/20/2021   HGBA1C 8.7 (A) 02/10/2021   HGBA1C 6.2 (A) 06/22/2020   HGBA1C 7.3 (A) 04/22/2020   HGBA1C 7.1 (A) 01/15/2020   No results  found for: INSULIN Lab Results  Component Value Date   TSH 1.600 05/18/2021   Lab Results  Component Value Date   CHOL 163 03/09/2021   HDL 63.30 03/09/2021   LDLCALC 83 03/09/2021   TRIG 79.0 03/09/2021   CHOLHDL 3 03/09/2021   Lab Results  Component Value Date   VD25OH 12.06 (L) 05/04/2020   Lab Results  Component Value Date   WBC 4.3 03/09/2021   HGB 13.0 03/09/2021   HCT 39.8 03/09/2021   MCV 88.8 03/09/2021   PLT 276.0 03/09/2021   No results found for: IRON, TIBC, FERRITIN  Attestation Statements:   Reviewed by clinician on day of visit: allergies, medications, problem list, medical history, surgical history, family history, social history, and previous encounter notes.   Wilhemena Durie, am acting as transcriptionist for [employer], DO.  I have reviewed the above documentation for accuracy and completeness, and I agree with the above. Marjory Sneddon, D.O.  The [place] was signed into law in 2016 which includes the topic of electronic health records.  This provides immediate access to information  in [place].  This includes consultation notes, operative notes, office notes, lab results and pathology reports.  If you have any questions about what you read please let us know at your next visit so we can discuss your concerns and take corrective action if need be.  We are right here with you.

## 2021-07-21 ENCOUNTER — Encounter: Payer: Self-pay | Admitting: Cardiovascular Disease

## 2021-07-21 ENCOUNTER — Ambulatory Visit (INDEPENDENT_AMBULATORY_CARE_PROVIDER_SITE_OTHER): Payer: PRIVATE HEALTH INSURANCE | Admitting: Cardiovascular Disease

## 2021-07-21 ENCOUNTER — Other Ambulatory Visit: Payer: Self-pay

## 2021-07-21 VITALS — BP 120/82 | HR 71 | Resp 18 | Ht 65.0 in | Wt 220.2 lb

## 2021-07-21 DIAGNOSIS — G4730 Sleep apnea, unspecified: Secondary | ICD-10-CM

## 2021-07-21 DIAGNOSIS — I2510 Atherosclerotic heart disease of native coronary artery without angina pectoris: Secondary | ICD-10-CM

## 2021-07-21 DIAGNOSIS — Z6836 Body mass index (BMI) 36.0-36.9, adult: Secondary | ICD-10-CM

## 2021-07-21 DIAGNOSIS — E119 Type 2 diabetes mellitus without complications: Secondary | ICD-10-CM

## 2021-07-21 DIAGNOSIS — E785 Hyperlipidemia, unspecified: Secondary | ICD-10-CM

## 2021-07-21 DIAGNOSIS — I517 Cardiomegaly: Secondary | ICD-10-CM

## 2021-07-21 DIAGNOSIS — I5189 Other ill-defined heart diseases: Secondary | ICD-10-CM | POA: Diagnosis not present

## 2021-07-21 DIAGNOSIS — Z794 Long term (current) use of insulin: Secondary | ICD-10-CM

## 2021-07-21 NOTE — Patient Instructions (Signed)

## 2021-07-21 NOTE — Progress Notes (Signed)
Cardiology Office Note    Date:  07/28/2021   ID:  [name], DOB 1961/05/04, MRN [id number]  PCP:  Flossie Buffy, NP  Cardiologist:  Shelva Majestic, MD (sleep); Dr. Berniece Salines  New sleep evaluation   History of Present Illness:  [name] is a 59 y.o. female who is followed by Dr. Berniece Salines.  She has a history of minimal coronary artery disease, hyperlipidemia, diabetes mellitus, as well as obesity and is also followed by Dr. Raliegh Scarlet at the [facility].  Due to concerns for obstructive sleep apnea with symptoms of snoring, frequent nocturnal urination, in addition to her hypertension, obesity, and diabetes, she was referred for a split-night sleep study on June 20, 2020.  This demonstrated severe obstructive sleep apnea during the diagnostic portion of the study with an AHI of 34.8/h and very severe sleep apnea during REM sleep with an AHI of 94.5/h.  She had severe oxygen desaturation to a nadir of 74% and there was moderate snoring.  As part of the split-night protocol, CPAP was initiated and she was titrated up to 15 cm of water.  Apparently, due to supply chain issues, she apparently never initiated CPAP therapy shortly after her procedure and did not receive a new ResMed AirSense 11 CPAP device until May 19, 2021.  A download was obtained today from May 23, 2021 through July 29, 2021.  Usage was 80% with average use at 5 hours and 49 minutes.  At 15 cm set pressure, AHI is excellent at 1.2.  Typically she goes to bed between midnight and 1 AM and often wakes up between 6 and 9 AM.  She has been using a ResMed air fit F 20 mask.  She does feel improved since initiating CPAP therapy.  She is unaware of breakthrough snoring.  An Epworth Sleepiness Scale score was calculated in the office today and this endorsed at 7 arguing against residual daytime sleepiness.  She underwent an echo Doppler study in November 2020 which showed normal systolic function with  EF at 55%.  There was moderate we increased left ventricular posterior wall thickness and severely increased left ventricular hypertrophy of the basal septum.  There was grade 1 diastolic dysfunction.    Presently she denies any chest pain.  She is on losartan 50 mg daily and takes furosemide 40 mg as needed for leg edema.  Medical she is on Jardiance, Trulicity, in addition to metformin for her diabetes mellitus.  She is on atorvastatin and omega-3 fatty acids for hyperlipidemia.  She has GERD of Prilosec.  She is now seeing Dr. Raliegh Scarlet for weight management of her obesity.  She presents for initial evaluation with me.   Past Medical History:  Diagnosis Date   Anemia    Arthritis    bilateral knees   Breast cancer screening 12/20/2015   Bronchitis    hx of   Carpal tunnel syndrome 12/29/2015   Cervical radiculopathy 11/28/2015   Diabetes mellitus without complication ([place]) 42/39/5320   Diastolic dysfunction 23/34/3568   Essential hypertension 06/13/2016   Fatty liver    Fibroid tumor    Had partial hysterectomy   Gallstones    Gastroesophageal reflux disease without esophagitis 03/11/2018   Heart burn    Joint pain    Left knee pain    bone on bone   Lesion of ulnar nerve 12/29/2015   Migraine    hx of   Obesity (BMI 30-39.9) 11/04/2019  Obesity (BMI 30.0-34.9) 03/31/2019   Primary osteoarthritis of both knees 10/10/2014   Severe sleep apnea 03/27/2021   Sleep apnea    SOBOE (shortness of breath on exertion)    Swelling of both lower extremities    Trigger finger, acquired 10/10/2014   Uncontrolled type 2 diabetes mellitus with hyperglycemia ([place]) 04/15/2018   Visit for preventive health examination 12/20/2015    Past Surgical History:  Procedure Laterality Date   ABDOMINAL HYSTERECTOMY     partial    boil removed      CARPAL TUNNEL RELEASE  04/15/2015   CHOLECYSTECTOMY  09/10/2011   ELBOW SURGERY  04/15/2015   HAND SURGERY  04/15/2015   Nerve damage  04/15/2015    TOTAL KNEE ARTHROPLASTY  08/01/2012   Procedure: TOTAL KNEE ARTHROPLASTY;  Surgeon: Augustin Schooling, MD;  Location: [facility];  Service: Orthopedics;  Laterality: Right;  RIGHT TOTAL KNEE ARTHROPLASTY   TRIGGER FINGER RELEASE  04/15/2015   TUBAL LIGATION  09/1993   WISDOM TOOTH EXTRACTION      Current Medications: Outpatient Medications Prior to Visit  Medication Sig Dispense Refill   atorvastatin (LIPITOR) 20 MG tablet Take 1 tablet (20 mg total) by mouth at bedtime as needed. 30 tablet 0   azelastine (ASTELIN) 0.1 % nasal spray Place 2 sprays into both nostrils 2 (two) times daily. Use in each nostril as directed 30 mL 12   BLACK COHOSH EXTRACT PO Take 1 tablet by mouth daily.     cholecalciferol (VITAMIN D3) 25 MCG (1000 UNIT) tablet Take 1,000 Units by mouth daily.     Cinnamon 500 MG capsule Take 1,000 mg by mouth 2 (two) times daily.     cyclobenzaprine (FLEXERIL) 10 MG tablet TAKE 1 TABLET AT BEDTIME ASNEEDED FOR MUSCLE SPASMS 90 tablet 0   Dulaglutide (TRULICITY) 3 WK/0.8UP SOPN Inject 3 mg as directed once a week. 6 mL 3   Echinacea-Goldenseal (ECHINACEA COMB/GOLDEN SEAL PO) Take 1 capsule by mouth daily as needed (Allergies).     empagliflozin (JARDIANCE) 25 MG TABS tablet Take 25 mg by mouth daily.     fluocinonide cream (LIDEX) 1.03 % Apply 1 application topically 2 (two) times daily as needed (Itching).     fluticasone (FLONASE) 50 MCG/ACT nasal spray Place 2 sprays into both nostrils as needed for allergies or rhinitis.     furosemide (LASIX) 40 MG tablet Take 40 mg by mouth as needed for edema.     gabapentin (NEURONTIN) 300 MG capsule Take 1 capsule (300 mg total) by mouth 2 (two) times daily. 180 capsule 3   insulin degludec (TRESIBA FLEXTOUCH) 200 UNIT/ML FlexTouch Pen Inject 50 Units into the skin daily. 24 mL 2   levocetirizine (XYZAL) 5 MG tablet Take 1 tablet (5 mg total) by mouth every evening. 90 tablet 3   losartan (COZAAR) 50 MG tablet Take 1 tablet (50 mg total) by  mouth daily. 30 tablet 0   meloxicam (MOBIC) 15 MG tablet Take 1 tablet (15 mg total) by mouth daily. 90 tablet 1   metFORMIN (GLUCOPHAGE-XR) 500 MG 24 hr tablet Take 2 tablets (1,000 mg total) by mouth daily with breakfast. 180 tablet 3   nitroGLYCERIN (NITRODUR - DOSED IN MG/24 HR) 0.2 mg/hr patch Place 0.2 mg onto the skin daily. Apply 1/4 patch daily as needed for tondonitis     Omega-3 1000 MG CAPS Take 1 g by mouth daily.     omeprazole (PRILOSEC) 20 MG capsule Take 1 capsule (20 mg  total) by mouth daily. 90 capsule 1   vitamin B-12 (CYANOCOBALAMIN) 100 MCG tablet Take 100 mcg by mouth daily.     vitamin C (ASCORBIC ACID) 500 MG tablet Take 1,000 mg by mouth daily.     vitamin E 400 UNIT capsule Take 400 Units by mouth daily. Reported on 02/13/2016     Multiple Vitamins-Minerals (WOMENS MULTIVITAMIN PO) Take by mouth daily. diabetic (Patient not taking: Reported on 07/21/2021)     No facility-administered medications prior to visit.     Allergies:   Penicillins and Tramadol   Social History   Socioeconomic History   Marital status: Single    Spouse name: Not on file   Number of children: 2   Years of education: Not on file   Highest education level: Not on file  Occupational History   Occupation: student   Occupation: Works in a group home  Tobacco Use   Smoking status: Former    Packs/day: 0.25    Years: 20.00    Pack years: 5.00    Types: Cigarettes    Quit date: 12/09/2009    Years since quitting: 11.6   Smokeless tobacco: Never  Vaping Use   Vaping Use: Never used  Substance and Sexual Activity   Alcohol use: Not Currently    Alcohol/week: 0.0 standard drinks    Comment: social   Drug use: No   Sexual activity: Not Currently    Birth control/protection: Surgical  Other Topics Concern   Not on file  Social History Narrative   Not on file   Social Determinants of Health   Financial Resource Strain: Not on file  Food Insecurity: Not on file  Transportation  Needs: Not on file  Physical Activity: Not on file  Stress: Not on file  Social Connections: Not on file    Socially she is single.  She lives with her father.  She has 1 child and 4 grandchildren.  There is remote tobacco history and she quit on December 09, 2009.  Family History:  The patient's family history includes Arthritis in an other family member; Asthma in her mother; Breast cancer (age of onset: 21) in her cousin; Colon cancer in her maternal uncle; Diabetes in her brother, mother, and sister; Diabetes (age of onset: 70) in her father; Healthy in her son; Heart disease in her maternal aunt and maternal uncle; Hyperlipidemia in her mother; Kidney failure (age of onset: 10) in her brother; Sudden death in her father; Tuberculosis in her mother.   ROS General: Negative; No fevers, chills, or night sweats; obesity HEENT: Negative; No changes in vision or hearing, sinus congestion, difficulty swallowing Pulmonary: Negative; No cough, wheezing, shortness of breath, hemoptysis Cardiovascular: Negative; No chest pain, presyncope, syncope, palpitations GI: Negative; No nausea, vomiting, diarrhea, or abdominal pain GU: Negative; No dysuria, hematuria, or difficulty voiding Musculoskeletal: Negative; no myalgias, joint pain, or weakness Hematologic/Oncology: Negative; no easy bruising, bleeding Endocrine: Negative; no heat/cold intolerance; no diabetes Neuro: Negative; no changes in balance, headaches Skin: Negative; No rashes or skin lesions Psychiatric: Negative; No behavioral problems, depression Sleep: OSA as above, positive for snoring, frequent nocturnal urination, previous nonrestorative sleep with some daytime sleepiness; no bruxism,  restless legs, hypnogognic hallucinations, no cataplexy CPAP set up date May 19, 2021; [facility], DME company Other comprehensive 14 point system review is negative.   PHYSICAL EXAM:   VS:  BP 120/82   Pulse 71   Resp 18   Ht '5\' 5"'   (1.651  m)   Wt 220 lb 3.2 oz (99.9 kg)   SpO2 97%   BMI 36.64 kg/m     Repeat blood pressure by me 110/78  Wt Readings from Last 3 Encounters:  07/21/21 220 lb 3.2 oz (99.9 kg)  07/12/21 220 lb (99.8 kg)  06/19/21 214 lb (97.1 kg)  Peak weight 225   General: Alert, oriented, no distress.  Skin: normal turgor, no rashes, warm and dry HEENT: Normocephalic, atraumatic. Pupils equal round and reactive to light; sclera anicteric; extraocular muscles intact;  Nose without nasal septal hypertrophy Mouth/Parynx benign; Mallinpatti scale 3 Neck: No JVD, no carotid bruits; normal carotid upstroke Lungs: clear to ausculatation and percussion; no wheezing or rales Chest wall: without tenderness to palpitation Heart: PMI not displaced, RRR, s1 s2 normal, 1/6 systolic murmur, no diastolic murmur, no rubs, gallops, thrills, or heaves Abdomen: soft, nontender; no hepatosplenomehaly, BS+; abdominal aorta nontender and not dilated by palpation. Back: no CVA tenderness Pulses 2+ Musculoskeletal: full range of motion, normal strength, no joint deformities Extremities: no clubbing cyanosis or edema, Homan's sign negative  Neurologic: grossly nonfocal; Cranial nerves grossly wnl Psychologic: Normal mood and affect   Studies/Labs Reviewed:   EKG:  EKG is ordered today.  ECG (independently read by me):  NSR at 71; nonspecific T changes  Recent Labs: BMP Latest Ref Rng & Units 03/09/2021 04/08/2020 02/03/2020  Glucose 70 - 99 mg/dL 95 71 203(H)  BUN 6 - 23 mg/dL '13 23 21  ' Creatinine 0.40 - 1.20 mg/dL 0.85 0.76 1.01(H)  BUN/Creat Ratio 12 - 28 - - 21  Sodium 135 - 145 mEq/L 140 137 142  Potassium 3.5 - 5.1 mEq/L 3.7 4.2 4.6  Chloride 96 - 112 mEq/L 105 103 104  CO2 19 - 32 mEq/L '28 24 23  ' Calcium 8.4 - 10.5 mg/dL 9.9 10.0 10.1     Hepatic Function Latest Ref Rng & Units 03/09/2021 05/03/2020 04/08/2020  Total Protein 6.0 - 8.3 g/dL 7.1 6.8 7.3  Albumin 3.5 - 5.2 g/dL 4.2 4.2 4.4  AST 0 - 37 U/L  15 23 47(H)  ALT 0 - 35 U/L 12 25 40(H)  Alk Phosphatase 39 - 117 U/L 124(H) 132(H) 125(H)  Total Bilirubin 0.2 - 1.2 mg/dL 0.4 0.3 0.5  Bilirubin, Direct 0.0 - 0.3 mg/dL - 0.1 -    CBC Latest Ref Rng & Units 03/09/2021 04/08/2020 03/11/2018  WBC 4.0 - 10.5 K/uL 4.3 7.9 4.6  Hemoglobin 12.0 - 15.0 g/dL 13.0 13.2 12.0  Hematocrit 36.0 - 46.0 % 39.8 39.8 36.4  Platelets 150.0 - 400.0 K/uL 276.0 300.0 288.0   Lab Results  Component Value Date   MCV 88.8 03/09/2021   MCV 89.0 04/08/2020   MCV 89.0 03/11/2018   Lab Results  Component Value Date   TSH 1.600 05/18/2021   Lab Results  Component Value Date   HGBA1C 7.1 (A) 04/20/2021     BNP No results found for: BNP  ProBNP No results found for: PROBNP   Lipid Panel     Component Value Date/Time   CHOL 163 03/09/2021 1135   CHOL 175 11/04/2019 1128   TRIG 79.0 03/09/2021 1135   HDL 63.30 03/09/2021 1135   HDL 73 11/04/2019 1128   CHOLHDL 3 03/09/2021 1135   VLDL 15.8 03/09/2021 1135   LDLCALC 83 03/09/2021 1135   LDLCALC 87 11/04/2019 1128   LABVLDL 15 11/04/2019 1128     RADIOLOGY: No results found.   Additional studies/ records that were  reviewed today include:   SPLIT NIGHT SLEEP STUDY: 06/20/2021 RESPIRATORY PARAMETERS Diagnostic Total AHI (/hr):            34.8     RDI (/hr):         37.5     OA Index (/hr):            -           CA Index (/hr):      0.4 REM AHI (/hr):            94.5     NREM AHI (/hr):          26.4     Supine AHI (/hr):         N/A      Non-supine AHI (/hr):        34.8 Min O2 Sat (%):          74.0     Mean O2 (%):  89.6     Time below 88% (min):           40           Titration Optimal Pressure (cm):           15        AHI at Optimal Pressure (/hr):            0.0       Min O2 at Optimal Pressure (%):       94.0 Supine % at Optimal (%):       100      Sleep % at Optimal (%):         100         SLEEP ARCHITECTURE The recording time for the entire night was 365.8 minutes.   During  a baseline period of 196.2 minutes, the patient slept for 134.5 minutes in REM and nonREM, yielding a sleep efficiency of 68.5%%. Sleep onset after lights out was 57.9 minutes with a REM latency of 93.0 minutes. The patient spent 6.7%% of the night in stage N1 sleep, 81.0%% in stage N2 sleep, 0.0%% in stage N3 and 12.3% in REM.   During the titration period of 166.1 minutes, the patient slept for 161.4 minutes in REM and nonREM, yielding a sleep efficiency of 97.2%%. Sleep onset after CPAP initiation was 2.7 minutes with a REM latency of 65.5 minutes. The patient spent 3.4%% of the night in stage N1 sleep, 47.7%% in stage N2 sleep, 0.0%% in stage N3 and 48.9% in REM.   CARDIAC DATA The 2 lead EKG demonstrated sinus rhythm. The mean heart rate was 100.0 beats per minute. Other EKG findings include: PVCs.   LEG MOVEMENT DATA The total Periodic Limb Movements of Sleep (PLMS) were 0. The PLMS index was 0.0 .   IMPRESSIONS - Severe obstructive sleep apnea occurred during the diagnostic portion of the study (AHI 34.8/h; RDI 37.5/h); events were very severe during REM sleep (AHI 94.5/h). CPAP was initiated at 5 cm and was titrated to optimal PAP pressure at 15 cm of water. - No significant central sleep apnea occurred during the diagnostic portion of the study (CAI = 0.4/hour). - Severe oxygen desaturation was noted during the diagnostic portion of the study to a nadir of 74.0%. - The patient snored with moderate snoring volume during the diagnostic portion of the study. - EKG findings include PVCs. - Clinically significant periodic limb movements did not occur during sleep.   DIAGNOSIS - Obstructive Sleep  Apnea (G47.33)   RECOMMENDATIONS - Recommend an initial trial of CPAP therapy with EPR at 15 cm H2O with a Medium size Resmed Full Face Mask AirFit F20 mask and heated humidification. - Effort should be made to optimize nasal and oropharyngeal patency. - Avoid alcohol, sedatives and other CNS  depressants that may worsen sleep apnea and disrupt normal sleep architecture. - Sleep hygiene should be reviewed to assess factors that may improve sleep quality. - Weight management (BMI 37) and regular exercise should be initiated or continued. - Return to [facility] Center for re-evaluation after 4 weeks of therapy      ASSESSMENT:    1. Severe sleep apnea   2. Hyperlipidemia with target LDL less than 70   3. Left ventricular hypertrophy   4. Diastolic dysfunction   5. 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   6. Type 2 diabetes mellitus without complication, with long-term current use of insulin (HCC)   7. Class 2 severe obesity due to excess calories with serious comorbidity and body mass index (BMI) of 36.0 to 36.9 in adult [facility])      PLAN:  [name] is a 60 year old female who has a history of obesity, type 2 diabetes mellitus, hypertension, hyperlipidemia, and was found to have severe obstructive sleep apnea on her split-night sleep evaluation in July 2021.  Unfortunately, due to supply chain issues resulting from the [place] pandemic as well as the Respironics recall, she was not able to receive a CPAP machine until May 19, 2021.  Fortunately, however, she received the newest ResMed air sense 11 AutoSet unit.  She has been using CPAP therapy and has noticed some improvement in her sleep pattern.  Her most recent download from July 13 through July 20, 2021 shows compliance with usage but there were days where she did not use the machine when she was not at home and had not taken her machine with her.  Uses greater than 4 hours was 70%.  I discussed with her the importance of optimal sleep duration at 7 to 9 hours for an adult, with average around 8 hours.  On her most recent download, average usage was only 5 hours and 47 minutes.  However, CPAP at 15 cm was excellent to control her severe sleep apnea with an AHI of 1.2/h.  She has been using the ResMed AirFit F 20 fullface mask and seems to  tolerate this well.  I discussed other options as well.  I had issues.  She has several cardiovascular comorbidities.  I had a long discussion with her today and discussed the adverse consequences of sleep apnea on her cardiovascular health.  I discussed the role in hypertension and the lack of the typical diastolic dip during sleep if her sleep apnea is untreated.  I also discussed potential for nocturnal arrhythmias with increased risk for atrial fibrillation.  On her diagnostic portion of her split-night protocol she had significant oxygen desaturation to nadir of 74% when I discussed implications regarding potential nocturnal ischemia both cardiac as well as cerebrovascular.  I also discussed its effects on glucose, inflammation, as well as GERD.  Presently she is obese with a BMI of 36.64.  She is now enrolled in the [facility] weight management center and will be seeing Dr. Raliegh Scarlet.  I discussed the importance of weight loss and exercise at least 5 days/week for at least 30 minutes of moderate intensity if at all possible.  Previously she had experienced significant increase in nocturia and I discussed  the physiology associated with this in detail.  I encouraged her to improve use to 100% and particularly improving her sleep hygiene to allow for optimal sleep duration at minimum 7 to preferably 8 hours per night.  Her blood pressure today is controlled on her current regimen of losartan 50 mg daily and she takes furosemide on an as-needed basis for leg swelling.  She is on atorvastatin for hyperlipidemia and LDL cholesterol in March 2022 was 83.  I would strive for less than 70 in this diabetic female.  She continues to be on metformin, Jardiance and Trulicity for diabetes mellitus.  I will see her in 1 year for reevaluation or sooner as needed.   Medication Adjustments/Labs and Tests Ordered: Current medicines are reviewed at length with the patient today.  Concerns regarding medicines are outlined above.   Medication changes, Labs and Tests ordered today are listed in the Patient Instructions below. Patient Instructions  Medication Instructions:  Your physician recommends that you continue on your current medications as directed. Please refer to the Current Medication list given to you today.  *If you need a refill on your cardiac medications before your next appointment, please call your pharmacy*   Lab Work: None ordered.    Testing/Procedures: None ordered.    Follow-Up: At [facility], you and your health needs are our priority.  As part of our continuing mission to provide you with exceptional heart care, we have created designated Provider Care Teams.  These Care Teams include your primary Cardiologist (physician) and Advanced Practice Providers (APPs -  Physician Assistants and Nurse Practitioners) who all work together to provide you with the care you need, when you need it.  We recommend signing up for the patient portal called "MyChart".  Sign up information is provided on this After Visit Summary.  MyChart is used to connect with patients for Virtual Visits (Telemedicine).  Patients are able to view lab/test results, encounter notes, upcoming appointments, etc.  Non-urgent messages can be sent to your provider as well.   To learn more about what you can do with MyChart, go to [email or web address].    Your next appointment:   12 month(s)  The format for your next appointment:   In Person  Provider:   Shelva Majestic, MD      Signed, Shelva Majestic, MD  07/28/2021 3:14 PM    [place] [address], [place], [place], [place]  [address] Phone: [phone]

## 2021-07-26 ENCOUNTER — Ambulatory Visit (INDEPENDENT_AMBULATORY_CARE_PROVIDER_SITE_OTHER): Payer: PRIVATE HEALTH INSURANCE | Admitting: Family Medicine

## 2021-07-28 ENCOUNTER — Encounter: Payer: Self-pay | Admitting: Cardiovascular Disease

## 2021-09-14 ENCOUNTER — Telehealth: Payer: Self-pay | Admitting: Nurse Practitioner

## 2021-09-14 NOTE — Telephone Encounter (Signed)
[name] from [place] for [place] Calling on behalf of [name]. She needs her sleep study report and her demographics. She has to find another provider.Marland Kitchen

## 2021-09-15 NOTE — Telephone Encounter (Signed)
Unable to reach [facility] by number provided, will wait for return call.

## 2021-09-18 ENCOUNTER — Telehealth: Payer: Self-pay | Admitting: Nurse Practitioner

## 2021-09-21 ENCOUNTER — Telehealth: Payer: Self-pay | Admitting: *Deleted

## 2021-09-21 NOTE — Telephone Encounter (Signed)
Received a transferred call from [facility] on behalf of [employer] requesting records, demographics and sleep study and CPAP order for the patient. They told me the patient is asking for a transfer of CPAP vendors. They will handle the transfer once they find a participating vendor. Patient is currently using [employer]. I do not know the reason for the patient's request to change DME companies. She has not been in contact with me nor our office in reference to this. Requested information faxed to [facility] @ [phone]. Case # [id number].

## 2021-10-30 ENCOUNTER — Other Ambulatory Visit: Payer: Self-pay

## 2021-10-30 ENCOUNTER — Ambulatory Visit (INDEPENDENT_AMBULATORY_CARE_PROVIDER_SITE_OTHER): Payer: Managed Care, Other (non HMO) | Admitting: Nurse Practitioner

## 2021-10-30 ENCOUNTER — Encounter: Payer: Self-pay | Admitting: Nurse Practitioner

## 2021-10-30 VITALS — BP 114/80 | HR 88 | Temp 96.4°F | Wt 215.6 lb

## 2021-10-30 DIAGNOSIS — Z23 Encounter for immunization: Secondary | ICD-10-CM

## 2021-10-30 DIAGNOSIS — U099 Post covid-19 condition, unspecified: Secondary | ICD-10-CM | POA: Diagnosis not present

## 2021-10-30 DIAGNOSIS — M5412 Radiculopathy, cervical region: Secondary | ICD-10-CM

## 2021-10-30 DIAGNOSIS — J301 Allergic rhinitis due to pollen: Secondary | ICD-10-CM

## 2021-10-30 DIAGNOSIS — N760 Acute vaginitis: Secondary | ICD-10-CM | POA: Diagnosis not present

## 2021-10-30 DIAGNOSIS — E1159 Type 2 diabetes mellitus with other circulatory complications: Secondary | ICD-10-CM

## 2021-10-30 DIAGNOSIS — I152 Hypertension secondary to endocrine disorders: Secondary | ICD-10-CM

## 2021-10-30 DIAGNOSIS — E1169 Type 2 diabetes mellitus with other specified complication: Secondary | ICD-10-CM

## 2021-10-30 DIAGNOSIS — Z794 Long term (current) use of insulin: Secondary | ICD-10-CM | POA: Diagnosis not present

## 2021-10-30 DIAGNOSIS — E785 Hyperlipidemia, unspecified: Secondary | ICD-10-CM

## 2021-10-30 LAB — BASIC METABOLIC PANEL
BUN: 17 mg/dL (ref 6–23)
CO2: 30 mEq/L (ref 19–32)
Calcium: 10.3 mg/dL (ref 8.4–10.5)
Chloride: 103 mEq/L (ref 96–112)
Creatinine, Ser: 0.95 mg/dL (ref 0.40–1.20)
GFR: 64.41 mL/min (ref >60.00)
Glucose, Bld: 104 mg/dL — ABNORMAL HIGH (ref 70–99)
Potassium: 4.2 mEq/L (ref 3.5–5.1)
Sodium: 139 mEq/L (ref 135–145)

## 2021-10-30 LAB — HEMOGLOBIN A1C: Hgb A1c MFr Bld: 9 % — ABNORMAL HIGH (ref 4.6–6.5)

## 2021-10-30 MED ORDER — LEVOCETIRIZINE DIHYDROCHLORIDE 5 MG PO TABS: 5.0000 mg | ORAL_TABLET | Freq: Every evening | ORAL | 3 refills | Status: DC

## 2021-10-30 MED ORDER — ATORVASTATIN CALCIUM 20 MG PO TABS: 20.0000 mg | ORAL_TABLET | Freq: Every day | ORAL | 3 refills | Status: DC

## 2021-10-30 MED ORDER — FLUCONAZOLE 150 MG PO TABS: 150.0000 mg | ORAL_TABLET | Freq: Every day | ORAL | 0 refills | Status: DC

## 2021-10-30 MED ORDER — CYCLOBENZAPRINE HCL 10 MG PO TABS: 10.0000 mg | ORAL_TABLET | Freq: Every evening | ORAL | 5 refills | Status: DC | PRN

## 2021-10-30 MED ORDER — ATORVASTATIN CALCIUM 20 MG PO TABS: 20.0000 mg | ORAL_TABLET | Freq: Every evening | ORAL | 3 refills | Status: DC | PRN

## 2021-10-30 MED ORDER — LOSARTAN POTASSIUM 50 MG PO TABS: 50.0000 mg | ORAL_TABLET | Freq: Every day | ORAL | 3 refills | Status: DC

## 2021-10-30 MED ORDER — MELOXICAM 15 MG PO TABS: 15.0000 mg | ORAL_TABLET | Freq: Every day | ORAL | 1 refills | Status: DC

## 2021-10-30 NOTE — Assessment & Plan Note (Addendum)
Home glucose 75-150 (fasting). Current use of metformin, jardiance, trulicity, and tresiba. She plans to schedule f/up appt with Dr. Loanne Drilling, last OV 02/2021. Last hgbA1c at 7.1% (04/2021). LDL at goal with atorvastatin Agreed to pneumovax 20 vaccine today. Declined influenza and 4th COVID vaccine. Reports vaginal discharge (thick and white) and itching with jardiance. She is not sexually active, no pelvic pain or dysuria or back pain or odor or rash.  BP Readings from Last 3 Encounters:  10/30/21 114/80  07/21/21 120/82  07/12/21 (!) 148/95   Wt Readings from Last 3 Encounters:  10/30/21 215 lb 9.6 oz (97.8 kg)  07/21/21 220 lb 3.2 oz (99.9 kg)  07/12/21 220 lb (99.8 kg)    repeat BMP and hgbA1c Diflucan sent for candida vaginitis Advised to schedule f/up appt with endocrinology

## 2021-10-30 NOTE — Patient Instructions (Addendum)
Continue use of OTC cough suppressant as needed: delsym or robitussin or mucinex-DM. Maintain adequate oral hydration.  Go to lab for blood draw Schedule lab appt with endocrinology.  Cough, Adult A cough helps to clear your throat and lungs. A cough may be a sign of an illness or another medical condition. An acute cough may only last 2-3 weeks, while a chronic cough may last 8 or more weeks. Many things can cause a cough. They include: Germs (viruses or bacteria) that attack the airway. Breathing in things that bother (irritate) your lungs. Allergies. Asthma. Mucus that runs down the back of your throat (postnasal drip). Smoking. Acid backing up from the stomach into the tube that moves food from the mouth to the stomach (gastroesophageal reflux). Some medicines. Lung problems. Other medical conditions, such as heart failure or a blood clot in the lung (pulmonary embolism). Follow these instructions at home: Medicines Take over-the-counter and prescription medicines only as told by your doctor. Talk with your doctor before you take medicines that stop a cough (cough suppressants). Lifestyle  Do not smoke, and try not to be around smoke. Do not use any products that contain nicotine or tobacco, such as cigarettes, e-cigarettes, and chewing tobacco. If you need help quitting, ask your doctor. Drink enough fluid to keep your pee (urine) pale yellow. Avoid caffeine. Do not drink alcohol if your doctor tells you not to drink. General instructions  Watch for any changes in your cough. Tell your doctor about them. Always cover your mouth when you cough. Stay away from things that make you cough, such as perfume, candles, campfire smoke, or cleaning products. If the air is dry, use a cool mist vaporizer or humidifier in your home. If your cough is worse at night, try using extra pillows to raise your head up higher while you sleep. Rest as needed. Keep all follow-up visits as told by  your doctor. This is important. Contact a doctor if: You have new symptoms. You cough up pus. Your cough does not get better after 2-3 weeks, or your cough gets worse. Cough medicine does not help your cough and you are not sleeping well. You have pain that gets worse or pain that is not helped with medicine. You have a fever. You are losing weight and you do not know why. You have night sweats. Get help right away if: You cough up blood. You have trouble breathing. Your heartbeat is very fast. These symptoms may be an emergency. Do not wait to see if the symptoms will go away. Get medical help right away. Call your local emergency services (911 in the [place] Do not drive yourself to the hospital. Summary A cough helps to clear your throat and lungs. Many things can cause a cough. Take over-the-counter and prescription medicines only as told by your doctor. Always cover your mouth when you cough. Contact a doctor if you have new symptoms or you have a cough that does not get better or gets worse. This information is not intended to replace advice given to you by your health care provider. Make sure you discuss any questions you have with your health care provider. Document Revised: 01/15/2020 Document Reviewed: 12/15/2018 Elsevier Patient Education  [place].

## 2021-10-30 NOTE — Progress Notes (Signed)
Subjective:  Patient ID: [name], female    DOB: December 10, 1961  Age: 60 y.o. MRN: [id number]  CC: Acute Visit (Pt c/o dry cough since the beginning of this month. Pt states this cough is post COVID. Vicente Males any congestion, body aches, or chills. )  COVID infection 09/2021 Has completed 3COVID vaccines, last dose 12/2020 Cough This is a new (onset after covid infection in October) problem. The current episode started more than 1 month ago. The problem has been waxing and waning. The problem occurs constantly. The cough is Non-productive. Pertinent negatives include no chest pain, chills, ear congestion, ear pain, fever, headaches, heartburn, hemoptysis, myalgias, nasal congestion, postnasal drip, rash, rhinorrhea, sore throat, shortness of breath, sweats, weight loss or wheezing. Nothing aggravates the symptoms. She has tried OTC cough suppressant for the symptoms. The treatment provided significant relief. There is no history of asthma, bronchiectasis, bronchitis, COPD, emphysema, environmental allergies or pneumonia.   DM (diabetes mellitus) ([place]) Home glucose 75-150 (fasting). Current use of metformin, jardiance, trulicity, and tresiba. She plans to schedule f/up appt with Dr. Loanne Drilling, last OV 02/2021. Last hgbA1c at 7.1% (04/2021). LDL at goal with atorvastatin Agreed to pneumovax 20 vaccine today. Declined influenza and 4th COVID vaccine. Reports vaginal discharge (thick and white) and itching with jardiance. She is not sexually active, no pelvic pain or dysuria or back pain or odor or rash.  BP Readings from Last 3 Encounters:  10/30/21 114/80  07/21/21 120/82  07/12/21 (!) 148/95   Wt Readings from Last 3 Encounters:  10/30/21 215 lb 9.6 oz (97.8 kg)  07/21/21 220 lb 3.2 oz (99.9 kg)  07/12/21 220 lb (99.8 kg)    repeat BMP and hgbA1c Diflucan sent for candida vaginitis Advised to schedule f/up appt with endocrinology   Reviewed past Medical, Social and Family history  today.  Outpatient Medications Prior to Visit  Medication Sig Dispense Refill   atorvastatin (LIPITOR) 20 MG tablet Take 1 tablet (20 mg total) by mouth at bedtime as needed. 30 tablet 0   azelastine (ASTELIN) 0.1 % nasal spray Place 2 sprays into both nostrils 2 (two) times daily. Use in each nostril as directed 30 mL 12   BLACK COHOSH EXTRACT PO Take 1 tablet by mouth daily.     cholecalciferol (VITAMIN D3) 25 MCG (1000 UNIT) tablet Take 1,000 Units by mouth daily.     Cinnamon 500 MG capsule Take 1,000 mg by mouth 2 (two) times daily.     cyclobenzaprine (FLEXERIL) 10 MG tablet TAKE 1 TABLET AT BEDTIME ASNEEDED FOR MUSCLE SPASMS 90 tablet 0   Dulaglutide (TRULICITY) 3 WU/9.8JX SOPN Inject 3 mg as directed once a week. 6 mL 3   Echinacea-Goldenseal (ECHINACEA COMB/GOLDEN SEAL PO) Take 1 capsule by mouth daily as needed (Allergies).     empagliflozin (JARDIANCE) 25 MG TABS tablet Take 25 mg by mouth daily.     fluocinonide cream (LIDEX) 9.14 % Apply 1 application topically 2 (two) times daily as needed (Itching).     fluticasone (FLONASE) 50 MCG/ACT nasal spray Place 2 sprays into both nostrils as needed for allergies or rhinitis.     furosemide (LASIX) 40 MG tablet Take 40 mg by mouth as needed for edema.     gabapentin (NEURONTIN) 300 MG capsule Take 1 capsule (300 mg total) by mouth 2 (two) times daily. 180 capsule 3   insulin degludec (TRESIBA FLEXTOUCH) 200 UNIT/ML FlexTouch Pen Inject 50 Units into the skin daily. 24 mL 2  levocetirizine (XYZAL) 5 MG tablet Take 1 tablet (5 mg total) by mouth every evening. 90 tablet 3   losartan (COZAAR) 50 MG tablet Take 1 tablet (50 mg total) by mouth daily. 30 tablet 0   meloxicam (MOBIC) 15 MG tablet Take 1 tablet (15 mg total) by mouth daily. 90 tablet 1   metFORMIN (GLUCOPHAGE-XR) 500 MG 24 hr tablet Take 2 tablets (1,000 mg total) by mouth daily with breakfast. 180 tablet 3   nitroGLYCERIN (NITRODUR - DOSED IN MG/24 HR) 0.2 mg/hr patch Place 0.2  mg onto the skin daily. Apply 1/4 patch daily as needed for tondonitis     Omega-3 1000 MG CAPS Take 1 g by mouth daily.     omeprazole (PRILOSEC) 20 MG capsule Take 1 capsule (20 mg total) by mouth daily. 90 capsule 1   vitamin B-12 (CYANOCOBALAMIN) 100 MCG tablet Take 100 mcg by mouth daily.     vitamin C (ASCORBIC ACID) 500 MG tablet Take 1,000 mg by mouth daily.     vitamin E 400 UNIT capsule Take 400 Units by mouth daily. Reported on 02/13/2016     No facility-administered medications prior to visit.    ROS See HPI  Objective:  BP 114/80 (BP Location: Left Arm, Patient Position: Sitting, Cuff Size: Large)   Pulse 88   Temp (!) 96.4 F (35.8 C) (Temporal)   Wt 215 lb 9.6 oz (97.8 kg)   SpO2 98%   BMI 35.88 kg/m   Physical Exam Constitutional:      Appearance: She is obese.  Cardiovascular:     Rate and Rhythm: Normal rate and regular rhythm.     Pulses: Normal pulses.     Heart sounds: Normal heart sounds.  Pulmonary:     Effort: Pulmonary effort is normal.     Breath sounds: Normal breath sounds.  Musculoskeletal:     Right lower leg: No edema.     Left lower leg: No edema.  Neurological:     Mental Status: She is alert and oriented to person, place, and time.    Assessment & Plan:  This visit occurred during the SARS-CoV-2 public health emergency.  Safety protocols were in place, including screening questions prior to the visit, additional usage of staff PPE, and extensive cleaning of exam room while observing appropriate contact time as indicated for disinfecting solutions.   [name] was seen today for acute visit.  Diagnoses and all orders for this visit:  Post-COVID syndrome  Type 2 diabetes mellitus with other specified complication, with long-term current use of insulin (HCC) -     Hemoglobin A1c -     Basic metabolic panel  Acute vaginitis -     fluconazole (DIFLUCAN) 150 MG tablet; Take 1 tablet (150 mg total) by mouth daily. Take second tab 3days  apart from first tab  Need for pneumococcal vaccine -     Pneumococcal conjugate vaccine 20-valent (Prevnar 20)  Clear lungs, no need for SABA or CXR at this time. Continue use of OTC cough suppressant as needed: delsym or robitussin or mucinex-DM. Maintain adequate oral hydration. Go to lab for blood draw Schedule lab appt with endocrinology.  Problem List Items Addressed This Visit       Endocrine   RESOLVED: Diabetes mellitus ([place])   Relevant Orders   Hemoglobin F0Y   Basic metabolic panel   Other Visit Diagnoses     Post-COVID syndrome    -  Primary   Acute vaginitis  Relevant Medications   fluconazole (DIFLUCAN) 150 MG tablet   Need for pneumococcal vaccine       Relevant Orders   Pneumococcal conjugate vaccine 20-valent (Prevnar 20) (Completed)       Follow-up: Return in about 6 months (around 04/29/2022) for CPE (fasting).  Wilfred Lacy, NP

## 2021-11-07 ENCOUNTER — Telehealth: Payer: Self-pay | Admitting: *Deleted

## 2021-11-07 NOTE — Telephone Encounter (Signed)
Left message to return a call to discuss CPAP machine cancellation request.

## 2021-11-07 NOTE — Telephone Encounter (Signed)
E-mail received from [employer], RT with [facility] informing me he has received a request from [place] to cancel the patient's CPAP request.

## 2021-11-14 ENCOUNTER — Other Ambulatory Visit: Payer: Self-pay

## 2021-11-14 ENCOUNTER — Ambulatory Visit (INDEPENDENT_AMBULATORY_CARE_PROVIDER_SITE_OTHER): Payer: Managed Care, Other (non HMO) | Admitting: Endocrinology

## 2021-11-14 VITALS — BP 140/82 | HR 108 | Ht 65.0 in | Wt 209.8 lb

## 2021-11-14 DIAGNOSIS — E1165 Type 2 diabetes mellitus with hyperglycemia: Secondary | ICD-10-CM | POA: Diagnosis not present

## 2021-11-14 DIAGNOSIS — Z794 Long term (current) use of insulin: Secondary | ICD-10-CM | POA: Diagnosis not present

## 2021-11-14 DIAGNOSIS — E1169 Type 2 diabetes mellitus with other specified complication: Secondary | ICD-10-CM | POA: Diagnosis not present

## 2021-11-14 LAB — POCT GLYCOSYLATED HEMOGLOBIN (HGB A1C): Hemoglobin A1C: 10 % — AB (ref 4.0–5.6)

## 2021-11-14 MED ORDER — TRULICITY 4.5 MG/0.5ML ~~LOC~~ SOAJ: 4.5000 mg | SUBCUTANEOUS | 3 refills | Status: DC

## 2021-11-14 NOTE — Patient Instructions (Addendum)
I have sent a prescription to your pharmacy, to increase the Trulicity, and:  Please continue the same other medications.  check your blood sugar twice a day.  vary the time of day when you check, between before the 3 meals, and at bedtime.  also check if you have symptoms of your blood sugar being too high or too low.  please keep a record of the readings and bring it to your next appointment here (or you can bring the meter itself).  You can write it on any piece of paper.  please call us sooner if your blood sugar goes below 70, or if you have a lot of readings over 200.  Please come back for a follow-up appointment in 2 months.

## 2021-11-14 NOTE — Progress Notes (Signed)
Subjective:    Patient ID: [name], female    DOB: 09/01/1961, 60 y.o.   MRN: [id number]  HPI Pt returns for f/u of diabetes mellitus:  DM type: Insulin-requiring type 2.  Dx'ed: 2017, when she had a cbg of 400 on steroids.  Complications: none.  Therapy: insulin, Trulicity, and 3 oral meds.   GDM: never.   DKA: never.  Severe hypoglycemia: never.  Pancreatitis: never.    SDOH: she is on qd insulin, due to f/o noncompliance.   Interval history:  no cbg record, but states cbg's vary from 75-190.  There is no trend throughout the day.  pt states she feels well in general.  No recent steroids.  Pt says she seldom misses meds, but this is not due to cost.   Past Medical History:  Diagnosis Date   Anemia    Arthritis    bilateral knees   Breast cancer screening 12/20/2015   Bronchitis    hx of   Carpal tunnel syndrome 12/29/2015   Cervical radiculopathy 11/28/2015   Diabetes mellitus without complication ([place]) 38/45/3646   Diastolic dysfunction 80/32/1224   Essential hypertension 06/13/2016   Fatty liver    Fibroid tumor    Had partial hysterectomy   Gallstones    Gastroesophageal reflux disease without esophagitis 03/11/2018   Heart burn    Joint pain    Left knee pain    bone on bone   Lesion of ulnar nerve 12/29/2015   Migraine    hx of   Obesity (BMI 30-39.9) 11/04/2019   Obesity (BMI 30.0-34.9) 03/31/2019   Primary osteoarthritis of both knees 10/10/2014   Severe sleep apnea 03/27/2021   Sleep apnea    SOBOE (shortness of breath on exertion)    Swelling of both lower extremities    Trigger finger, acquired 10/10/2014   Uncontrolled type 2 diabetes mellitus with hyperglycemia ([place]) 04/15/2018   Visit for preventive health examination 12/20/2015    Past Surgical History:  Procedure Laterality Date   ABDOMINAL HYSTERECTOMY     partial    boil removed      CARPAL TUNNEL RELEASE  04/15/2015   CHOLECYSTECTOMY  09/10/2011   ELBOW SURGERY  04/15/2015    HAND SURGERY  04/15/2015   Nerve damage  04/15/2015   TOTAL KNEE ARTHROPLASTY  08/01/2012   Procedure: TOTAL KNEE ARTHROPLASTY;  Surgeon: Augustin Schooling, MD;  Location: [facility];  Service: Orthopedics;  Laterality: Right;  RIGHT TOTAL KNEE ARTHROPLASTY   TRIGGER FINGER RELEASE  04/15/2015   TUBAL LIGATION  09/1993   WISDOM TOOTH EXTRACTION      Social History   Socioeconomic History   Marital status: Single    Spouse name: Not on file   Number of children: 2   Years of education: Not on file   Highest education level: Not on file  Occupational History   Occupation: student   Occupation: Works in a group home  Tobacco Use   Smoking status: Former    Packs/day: 0.25    Years: 20.00    Pack years: 5.00    Types: Cigarettes    Quit date: 12/09/2009    Years since quitting: 11.9   Smokeless tobacco: Never  Vaping Use   Vaping Use: Never used  Substance and Sexual Activity   Alcohol use: Not Currently    Alcohol/week: 0.0 standard drinks    Comment: social   Drug use: No   Sexual activity: Not Currently    Birth  control/protection: Surgical  Other Topics Concern   Not on file  Social History Narrative   Not on file   Social Determinants of Health   Financial Resource Strain: Not on file  Food Insecurity: Not on file  Transportation Needs: Not on file  Physical Activity: Not on file  Stress: Not on file  Social Connections: Not on file  Intimate Partner Violence: Not on file    Current Outpatient Medications on File Prior to Visit  Medication Sig Dispense Refill   atorvastatin (LIPITOR) 20 MG tablet Take 1 tablet (20 mg total) by mouth at bedtime. 90 tablet 3   azelastine (ASTELIN) 0.1 % nasal spray Place 2 sprays into both nostrils 2 (two) times daily. Use in each nostril as directed 30 mL 12   BLACK COHOSH EXTRACT PO Take 1 tablet by mouth daily.     cholecalciferol (VITAMIN D3) 25 MCG (1000 UNIT) tablet Take 1,000 Units by mouth daily.     Cinnamon 500 MG capsule  Take 1,000 mg by mouth 2 (two) times daily.     cyclobenzaprine (FLEXERIL) 10 MG tablet Take 1 tablet (10 mg total) by mouth at bedtime as needed for muscle spasms. 30 tablet 5   Echinacea-Goldenseal (ECHINACEA COMB/GOLDEN SEAL PO) Take 1 capsule by mouth daily as needed (Allergies).     empagliflozin (JARDIANCE) 25 MG TABS tablet Take 25 mg by mouth daily.     fluconazole (DIFLUCAN) 150 MG tablet Take 1 tablet (150 mg total) by mouth daily. Take second tab 3days apart from first tab 2 tablet 0   fluocinonide cream (LIDEX) 7.25 % Apply 1 application topically 2 (two) times daily as needed (Itching).     fluticasone (FLONASE) 50 MCG/ACT nasal spray Place 2 sprays into both nostrils as needed for allergies or rhinitis.     furosemide (LASIX) 40 MG tablet Take 40 mg by mouth as needed for edema.     gabapentin (NEURONTIN) 300 MG capsule Take 1 capsule (300 mg total) by mouth 2 (two) times daily. 180 capsule 3   insulin degludec (TRESIBA FLEXTOUCH) 200 UNIT/ML FlexTouch Pen Inject 50 Units into the skin daily. 24 mL 2   levocetirizine (XYZAL) 5 MG tablet Take 1 tablet (5 mg total) by mouth every evening. 90 tablet 3   losartan (COZAAR) 50 MG tablet Take 1 tablet (50 mg total) by mouth daily. 90 tablet 3   meloxicam (MOBIC) 15 MG tablet Take 1 tablet (15 mg total) by mouth daily. With food 90 tablet 1   metFORMIN (GLUCOPHAGE-XR) 500 MG 24 hr tablet Take 2 tablets (1,000 mg total) by mouth daily with breakfast. 180 tablet 3   nitroGLYCERIN (NITRODUR - DOSED IN MG/24 HR) 0.2 mg/hr patch Place 0.2 mg onto the skin daily. Apply 1/4 patch daily as needed for tondonitis     Omega-3 1000 MG CAPS Take 1 g by mouth daily.     omeprazole (PRILOSEC) 20 MG capsule Take 1 capsule (20 mg total) by mouth daily. 90 capsule 1   vitamin B-12 (CYANOCOBALAMIN) 100 MCG tablet Take 100 mcg by mouth daily.     vitamin C (ASCORBIC ACID) 500 MG tablet Take 1,000 mg by mouth daily.     vitamin E 400 UNIT capsule Take 400 Units  by mouth daily. Reported on 02/13/2016     No current facility-administered medications on file prior to visit.    Allergies  Allergen Reactions   Penicillins Hives    Hives    Tramadol Itching  Family History  Problem Relation Age of Onset   Diabetes Mother        Living   Hyperlipidemia Mother    Tuberculosis Mother    Asthma Mother    Diabetes Father 79       Deceased   Sudden death Father    Diabetes Sister    Diabetes Brother    Kidney failure Brother 58       Diseased   Healthy Son        x1   Heart disease Maternal Aunt    Heart disease Maternal Uncle    Colon cancer Maternal Uncle        dx in 68's   Breast cancer Cousin 34   Arthritis Other        Maternal Aunts & Uncles   Esophageal cancer Neg Hx    Rectal cancer Neg Hx    Stomach cancer Neg Hx     BP 140/82   Pulse (!) 108   Ht 5\' 5"  (1.651 m)   Wt 209 lb 12.8 oz (95.2 kg)   SpO2 98%   BMI 34.91 kg/m    Review of Systems She denies N/HB    Objective:   Physical Exam    Lab Results  Component Value Date   HGBA1C 10.0 (A) 11/14/2021      Assessment & Plan:  Insulin-requiring type 2 DM: uncontrolled.   Patient Instructions  I have sent a prescription to your pharmacy, to increase the Trulicity, and:  Please continue the same other medications.  check your blood sugar twice a day.  vary the time of day when you check, between before the 3 meals, and at bedtime.  also check if you have symptoms of your blood sugar being too high or too low.  please keep a record of the readings and bring it to your next appointment here (or you can bring the meter itself).  You can write it on any piece of paper.  please call us sooner if your blood sugar goes below 70, or if you have a lot of readings over 200.  Please come back for a follow-up appointment in 2 months.

## 2021-12-27 ENCOUNTER — Ambulatory Visit: Payer: PRIVATE HEALTH INSURANCE | Admitting: Nurse Practitioner

## 2021-12-28 ENCOUNTER — Telehealth: Payer: Self-pay | Admitting: Endocrinology

## 2021-12-28 ENCOUNTER — Telehealth: Payer: Self-pay | Admitting: Nurse Practitioner

## 2021-12-28 NOTE — Telephone Encounter (Signed)
Pt needs pharmacy changed to [facility] [address], [place].

## 2021-12-28 NOTE — Telephone Encounter (Signed)
Spoke with patient and she would like mail order pharmacy removed from her chart at this time due to some insurance issues. Pt states if she does go back to mail order she will call and update our office.

## 2021-12-28 NOTE — Telephone Encounter (Signed)
MEDICATION: Trulicity, Tresiba, and Metformin  PHARMACY:  [facility] [address]  HAS THE PATIENT CONTACTED THEIR PHARMACY?  yes  IS THIS A 90 DAY SUPPLY : NO  IS PATIENT OUT OF MEDICATION: NO  IF NOT; HOW MUCH IS LEFT:   LAST APPOINTMENT DATE: @12 /05/2021  NEXT APPOINTMENT DATE:@2 /06/2022  DO WE HAVE YOUR PERMISSION TO LEAVE A DETAILED MESSAGE?:  OTHER COMMENTS:  patients insurance changed and she is no longer using mail order   **Let patient know to contact pharmacy at the end of the day to make sure medication is ready. **  ** Please notify patient to allow 48-72 hours to process**  **Encourage patient to contact the pharmacy for refills or they can request refills through [facility]**

## 2021-12-29 ENCOUNTER — Other Ambulatory Visit (HOSPITAL_COMMUNITY): Payer: Self-pay

## 2021-12-29 ENCOUNTER — Telehealth: Payer: Self-pay

## 2021-12-29 ENCOUNTER — Other Ambulatory Visit: Payer: Self-pay

## 2021-12-29 DIAGNOSIS — E1165 Type 2 diabetes mellitus with hyperglycemia: Secondary | ICD-10-CM

## 2021-12-29 MED ORDER — METFORMIN HCL ER 500 MG PO TB24: 1000.0000 mg | ORAL_TABLET | Freq: Every day | ORAL | 3 refills | Status: DC

## 2021-12-29 MED ORDER — TRULICITY 4.5 MG/0.5ML ~~LOC~~ SOAJ: 4.5000 mg | SUBCUTANEOUS | 3 refills | Status: DC

## 2021-12-29 MED ORDER — TRESIBA FLEXTOUCH 200 UNIT/ML ~~LOC~~ SOPN: 50.0000 [IU] | PEN_INJECTOR | Freq: Every day | SUBCUTANEOUS | 2 refills | Status: DC

## 2021-12-29 NOTE — Telephone Encounter (Signed)
Patient Advocate Encounter  Prior Authorization for Trulicity 4.5mg /0.15ml pen injectors has been approved.    PA# [id number]   Effective dates: 12/29/21 through 12/29/22  Per Test Claim Patients co-pay is $25   Spoke with Pharmacy to Process.  Patient Advocate Fax: [phone]

## 2021-12-29 NOTE — Telephone Encounter (Signed)
Patient Advocate Encounter   Received notification from [facility] that prior authorization for Trulicity is required by his/her insurance [employer].   PA submitted on 12/29/21  Key#: [id number]  Status is pending    [facility] will continue to follow:  Patient Advocate Fax: [phone]

## 2021-12-29 NOTE — Telephone Encounter (Signed)
Rx sent 

## 2022-01-16 ENCOUNTER — Ambulatory Visit: Payer: Managed Care, Other (non HMO) | Admitting: Endocrinology

## 2022-02-03 ENCOUNTER — Other Ambulatory Visit (INDEPENDENT_AMBULATORY_CARE_PROVIDER_SITE_OTHER): Payer: Self-pay | Admitting: Family Medicine

## 2022-02-03 DIAGNOSIS — E1169 Type 2 diabetes mellitus with other specified complication: Secondary | ICD-10-CM

## 2022-02-05 ENCOUNTER — Encounter: Payer: Self-pay | Admitting: Endocrinology

## 2022-02-05 ENCOUNTER — Ambulatory Visit: Payer: Managed Care, Other (non HMO) | Admitting: Endocrinology

## 2022-02-05 ENCOUNTER — Other Ambulatory Visit: Payer: Self-pay

## 2022-02-05 VITALS — BP 122/78 | HR 102 | Ht 65.0 in | Wt 206.8 lb

## 2022-02-05 DIAGNOSIS — E1165 Type 2 diabetes mellitus with hyperglycemia: Secondary | ICD-10-CM

## 2022-02-05 LAB — POCT GLYCOSYLATED HEMOGLOBIN (HGB A1C): Hemoglobin A1C: 10.1 % — AB (ref 4.0–5.6)

## 2022-02-05 MED ORDER — TRESIBA FLEXTOUCH 200 UNIT/ML ~~LOC~~ SOPN: 60.0000 [IU] | PEN_INJECTOR | Freq: Every day | SUBCUTANEOUS | 2 refills | Status: DC

## 2022-02-05 NOTE — Patient Instructions (Addendum)
I have sent a prescription to your pharmacy, to increase the Tresiba to 60 units per day, and:  Please continue the same other medications.  check your blood sugar twice a day.  vary the time of day when you check, between before the 3 meals, and at bedtime.  also check if you have symptoms of your blood sugar being too high or too low.  please keep a record of the readings and bring it to your next appointment here (or you can bring the meter itself).  You can write it on any piece of paper.  please call us sooner if your blood sugar goes below 70, or if you have a lot of readings over 200.  Please come back for a follow-up appointment in 2 months.

## 2022-02-05 NOTE — Telephone Encounter (Signed)
Dr.Opalski ?

## 2022-02-05 NOTE — Progress Notes (Signed)
Subjective:    Patient ID: [name], female    DOB: 24-Aug-1961, 61 y.o.   MRN: [id number]  HPI Pt returns for f/u of diabetes mellitus:  DM type: Insulin-requiring type 2.  Dx'ed: 2017, when she had a cbg of 400 on steroids.  Complications: none.  Therapy: insulin, Trulicity, and 2 oral meds.   GDM: never.   DKA: never.  Severe hypoglycemia: never.  Pancreatitis: never.    SDOH: she is on qd insulin, due to f/o noncompliance; fructosamine has sometimes confirmed A1c, and sometimes disagreed.   Interval history:  no cbg record, but states cbg's vary from 73-210.  pt states she feels well in general.  No recent steroids.  Pt says she seldom misses meds.   Past Medical History:  Diagnosis Date   Anemia    Arthritis    bilateral knees   Breast cancer screening 12/20/2015   Bronchitis    hx of   Carpal tunnel syndrome 12/29/2015   Cervical radiculopathy 11/28/2015   Diabetes mellitus without complication ([place]) 76/54/6503   Diastolic dysfunction 54/65/6812   Essential hypertension 06/13/2016   Fatty liver    Fibroid tumor    Had partial hysterectomy   Gallstones    Gastroesophageal reflux disease without esophagitis 03/11/2018   Heart burn    Joint pain    Left knee pain    bone on bone   Lesion of ulnar nerve 12/29/2015   Migraine    hx of   Obesity (BMI 30-39.9) 11/04/2019   Obesity (BMI 30.0-34.9) 03/31/2019   Primary osteoarthritis of both knees 10/10/2014   Severe sleep apnea 03/27/2021   Sleep apnea    SOBOE (shortness of breath on exertion)    Swelling of both lower extremities    Trigger finger, acquired 10/10/2014   Uncontrolled type 2 diabetes mellitus with hyperglycemia ([place]) 04/15/2018   Visit for preventive health examination 12/20/2015    Past Surgical History:  Procedure Laterality Date   ABDOMINAL HYSTERECTOMY     partial    boil removed      CARPAL TUNNEL RELEASE  04/15/2015   CHOLECYSTECTOMY  09/10/2011   ELBOW SURGERY  04/15/2015    HAND SURGERY  04/15/2015   Nerve damage  04/15/2015   TOTAL KNEE ARTHROPLASTY  08/01/2012   Procedure: TOTAL KNEE ARTHROPLASTY;  Surgeon: Augustin Schooling, MD;  Location: [facility];  Service: Orthopedics;  Laterality: Right;  RIGHT TOTAL KNEE ARTHROPLASTY   TRIGGER FINGER RELEASE  04/15/2015   TUBAL LIGATION  09/1993   WISDOM TOOTH EXTRACTION      Social History   Socioeconomic History   Marital status: Single    Spouse name: Not on file   Number of children: 2   Years of education: Not on file   Highest education level: Not on file  Occupational History   Occupation: student   Occupation: Works in a group home  Tobacco Use   Smoking status: Former    Packs/day: 0.25    Years: 20.00    Pack years: 5.00    Types: Cigarettes    Quit date: 12/09/2009    Years since quitting: 12.1   Smokeless tobacco: Never  Vaping Use   Vaping Use: Never used  Substance and Sexual Activity   Alcohol use: Not Currently    Alcohol/week: 0.0 standard drinks    Comment: social   Drug use: No   Sexual activity: Not Currently    Birth control/protection: Surgical  Other Topics Concern  Not on file  Social History Narrative   Not on file   Social Determinants of Health   Financial Resource Strain: Not on file  Food Insecurity: Not on file  Transportation Needs: Not on file  Physical Activity: Not on file  Stress: Not on file  Social Connections: Not on file  Intimate Partner Violence: Not on file    Current Outpatient Medications on File Prior to Visit  Medication Sig Dispense Refill   atorvastatin (LIPITOR) 20 MG tablet Take 1 tablet (20 mg total) by mouth at bedtime. 90 tablet 3   azelastine (ASTELIN) 0.1 % nasal spray Place 2 sprays into both nostrils 2 (two) times daily. Use in each nostril as directed 30 mL 12   BLACK COHOSH EXTRACT PO Take 1 tablet by mouth daily.     cholecalciferol (VITAMIN D3) 25 MCG (1000 UNIT) tablet Take 1,000 Units by mouth daily.     Cinnamon 500 MG capsule  Take 1,000 mg by mouth 2 (two) times daily.     cyclobenzaprine (FLEXERIL) 10 MG tablet Take 1 tablet (10 mg total) by mouth at bedtime as needed for muscle spasms. 30 tablet 5   Dulaglutide (TRULICITY) 4.5 TD/4.2AJ SOPN Inject 4.5 mg as directed once a week. 6 mL 3   Echinacea-Goldenseal (ECHINACEA COMB/GOLDEN SEAL PO) Take 1 capsule by mouth daily as needed (Allergies).     empagliflozin (JARDIANCE) 25 MG TABS tablet Take 25 mg by mouth daily.     fluconazole (DIFLUCAN) 150 MG tablet Take 1 tablet (150 mg total) by mouth daily. Take second tab 3days apart from first tab 2 tablet 0   fluocinonide cream (LIDEX) 6.81 % Apply 1 application topically 2 (two) times daily as needed (Itching).     fluticasone (FLONASE) 50 MCG/ACT nasal spray Place 2 sprays into both nostrils as needed for allergies or rhinitis.     furosemide (LASIX) 40 MG tablet Take 40 mg by mouth as needed for edema.     gabapentin (NEURONTIN) 300 MG capsule Take 1 capsule (300 mg total) by mouth 2 (two) times daily. 180 capsule 3   levocetirizine (XYZAL) 5 MG tablet Take 1 tablet (5 mg total) by mouth every evening. 90 tablet 3   losartan (COZAAR) 50 MG tablet Take 1 tablet (50 mg total) by mouth daily. 90 tablet 3   meloxicam (MOBIC) 15 MG tablet Take 1 tablet (15 mg total) by mouth daily. With food 90 tablet 1   metFORMIN (GLUCOPHAGE-XR) 500 MG 24 hr tablet Take 2 tablets (1,000 mg total) by mouth daily with breakfast. 180 tablet 3   nitroGLYCERIN (NITRODUR - DOSED IN MG/24 HR) 0.2 mg/hr patch Place 0.2 mg onto the skin daily. Apply 1/4 patch daily as needed for tondonitis     Omega-3 1000 MG CAPS Take 1 g by mouth daily.     omeprazole (PRILOSEC) 20 MG capsule Take 1 capsule (20 mg total) by mouth daily. 90 capsule 1   vitamin B-12 (CYANOCOBALAMIN) 100 MCG tablet Take 100 mcg by mouth daily.     vitamin C (ASCORBIC ACID) 500 MG tablet Take 1,000 mg by mouth daily.     vitamin E 400 UNIT capsule Take 400 Units by mouth daily.  Reported on 02/13/2016     No current facility-administered medications on file prior to visit.    Allergies  Allergen Reactions   Penicillins Hives    Hives    Tramadol Itching    Family History  Problem Relation Age of Onset  Diabetes Mother        Living   Hyperlipidemia Mother    Tuberculosis Mother    Asthma Mother    Diabetes Father 35       Deceased   Sudden death Father    Diabetes Sister    Diabetes Brother    Kidney failure Brother 31       Diseased   Healthy Son        x1   Heart disease Maternal Aunt    Heart disease Maternal Uncle    Colon cancer Maternal Uncle        dx in 49's   Breast cancer Cousin 34   Arthritis Other        Maternal Aunts & Uncles   Esophageal cancer Neg Hx    Rectal cancer Neg Hx    Stomach cancer Neg Hx     BP 122/78 (BP Location: Left Arm, Patient Position: Sitting, Cuff Size: Normal)    Pulse (!) 102    Ht 5\' 5"  (1.651 m)    Wt 206 lb 12.8 oz (93.8 kg)    SpO2 98%    BMI 34.41 kg/m    Review of Systems She denies hypoglycemia.      Objective:   Physical Exam  Lab Results  Component Value Date   CREATININE 0.95 10/30/2021   BUN 17 10/30/2021   NA 139 10/30/2021   K 4.2 10/30/2021   CL 103 10/30/2021   CO2 30 10/30/2021    A1c=10.1%    Assessment & Plan:  Insulin-requiring type 2 DM: uncontrolled  Patient Instructions  I have sent a prescription to your pharmacy, to increase the Tresiba to 60 units per day, and:  Please continue the same other medications.  check your blood sugar twice a day.  vary the time of day when you check, between before the 3 meals, and at bedtime.  also check if you have symptoms of your blood sugar being too high or too low.  please keep a record of the readings and bring it to your next appointment here (or you can bring the meter itself).  You can write it on any piece of paper.  please call us sooner if your blood sugar goes below 70, or if you have a lot of readings over 200.   Please come back for a follow-up appointment in 2 months.

## 2022-03-14 LAB — HM DIABETES EYE EXAM

## 2022-04-02 ENCOUNTER — Other Ambulatory Visit (INDEPENDENT_AMBULATORY_CARE_PROVIDER_SITE_OTHER): Payer: Self-pay | Admitting: Family Medicine

## 2022-04-02 ENCOUNTER — Ambulatory Visit (INDEPENDENT_AMBULATORY_CARE_PROVIDER_SITE_OTHER): Payer: Managed Care, Other (non HMO) | Admitting: Endocrinology

## 2022-04-02 ENCOUNTER — Other Ambulatory Visit: Payer: Self-pay | Admitting: Endocrinology

## 2022-04-02 VITALS — BP 130/90 | HR 73 | Ht 65.0 in | Wt 208.8 lb

## 2022-04-02 DIAGNOSIS — I152 Hypertension secondary to endocrine disorders: Secondary | ICD-10-CM

## 2022-04-02 DIAGNOSIS — E1165 Type 2 diabetes mellitus with hyperglycemia: Secondary | ICD-10-CM | POA: Diagnosis not present

## 2022-04-02 LAB — POCT GLYCOSYLATED HEMOGLOBIN (HGB A1C): Hemoglobin A1C: 9.9 % — AB (ref 4.0–5.6)

## 2022-04-02 MED ORDER — TRESIBA FLEXTOUCH 200 UNIT/ML ~~LOC~~ SOPN: 70.0000 [IU] | PEN_INJECTOR | Freq: Every day | SUBCUTANEOUS | 1 refills | Status: DC

## 2022-04-02 NOTE — Patient Instructions (Addendum)
I have sent a prescription to your pharmacy, to increase the Tresiba to 70 units per day, and:  ?Please continue the same other medications.  ?check your blood sugar twice a day.  vary the time of day when you check, between before the 3 meals, and at bedtime.  also check if you have symptoms of your blood sugar being too high or too low.  please keep a record of the readings and bring it to your next appointment here (or you can bring the meter itself).  You can write it on any piece of paper.  please call us sooner if your blood sugar goes below 70, or if you have a lot of readings over 200.   ?You should have an endocrinology follow-up appointment in 2 months.  ?

## 2022-04-02 NOTE — Progress Notes (Signed)
? ?Subjective:  ? ? Patient ID: [name], female    DOB: 09-04-1961, 61 y.o.   MRN: [id number] ? ?HPI ?Pt returns for f/u of diabetes mellitus:  ?DM type: Insulin-requiring type 2.  ?Dx'ed: 2017, when she had a cbg of 400 on steroids.  ?Complications: none.  ?Therapy: insulin, Trulicity, and 2 oral meds.   ?GDM: never.   ?DKA: never.  ?Severe hypoglycemia: never.  ?Pancreatitis: never.    ?SDOH: she is on qd insulin, due to f/o noncompliance; fructosamine has sometimes confirmed A1c, and sometimes disagreed.   ?Interval history:  no cbg record, but states cbg's vary from 87-200.  pt states she feels well in general.  No recent steroids.  Pt says she seldom misses meds.   ?Past Medical History:  ?Diagnosis Date  ? Anemia   ? Arthritis   ? bilateral knees  ? Breast cancer screening 12/20/2015  ? Bronchitis   ? hx of  ? Carpal tunnel syndrome 12/29/2015  ? Cervical radiculopathy 11/28/2015  ? Diabetes mellitus without complication ([place]) 99/37/1696  ? Diastolic dysfunction 78/93/8101  ? Essential hypertension 06/13/2016  ? Fatty liver   ? Fibroid tumor   ? Had partial hysterectomy  ? Gallstones   ? Gastroesophageal reflux disease without esophagitis 03/11/2018  ? Heart burn   ? Joint pain   ? Left knee pain   ? bone on bone  ? Lesion of ulnar nerve 12/29/2015  ? Migraine   ? hx of  ? Obesity (BMI 30-39.9) 11/04/2019  ? Obesity (BMI 30.0-34.9) 03/31/2019  ? Primary osteoarthritis of both knees 10/10/2014  ? Severe sleep apnea 03/27/2021  ? Sleep apnea   ? SOBOE (shortness of breath on exertion)   ? Swelling of both lower extremities   ? Trigger finger, acquired 10/10/2014  ? Uncontrolled type 2 diabetes mellitus with hyperglycemia ([place]) 04/15/2018  ? Visit for preventive health examination 12/20/2015  ? ? ?Past Surgical History:  ?Procedure Laterality Date  ? ABDOMINAL HYSTERECTOMY    ? partial   ? boil removed     ? CARPAL TUNNEL RELEASE  04/15/2015  ? CHOLECYSTECTOMY  09/10/2011  ? ELBOW SURGERY  04/15/2015  ?  HAND SURGERY  04/15/2015  ? Nerve damage  04/15/2015  ? TOTAL KNEE ARTHROPLASTY  08/01/2012  ? Procedure: TOTAL KNEE ARTHROPLASTY;  Surgeon: Augustin Schooling, MD;  Location: [facility];  Service: Orthopedics;  Laterality: Right;  RIGHT TOTAL KNEE ARTHROPLASTY  ? TRIGGER FINGER RELEASE  04/15/2015  ? TUBAL LIGATION  09/1993  ? WISDOM TOOTH EXTRACTION    ? ? ?Social History  ? ?Socioeconomic History  ? Marital status: Single  ?  Spouse name: Not on file  ? Number of children: 2  ? Years of education: Not on file  ? Highest education level: Not on file  ?Occupational History  ? Occupation: student  ? Occupation: Works in a group home  ?Tobacco Use  ? Smoking status: Former  ?  Packs/day: 0.25  ?  Years: 20.00  ?  Pack years: 5.00  ?  Types: Cigarettes  ?  Quit date: 12/09/2009  ?  Years since quitting: 12.3  ? Smokeless tobacco: Never  ?Vaping Use  ? Vaping Use: Never used  ?Substance and Sexual Activity  ? Alcohol use: Not Currently  ?  Alcohol/week: 0.0 standard drinks  ?  Comment: social  ? Drug use: No  ? Sexual activity: Not Currently  ?  Birth control/protection: Surgical  ?Other Topics Concern  ?  Not on file  ?Social History Narrative  ? Not on file  ? ?Social Determinants of Health  ? ?Financial Resource Strain: Not on file  ?Food Insecurity: Not on file  ?Transportation Needs: Not on file  ?Physical Activity: Not on file  ?Stress: Not on file  ?Social Connections: Not on file  ?Intimate Partner Violence: Not on file  ? ? ?Current Outpatient Medications on File Prior to Visit  ?Medication Sig Dispense Refill  ? atorvastatin (LIPITOR) 20 MG tablet Take 1 tablet (20 mg total) by mouth at bedtime. 90 tablet 3  ? azelastine (ASTELIN) 0.1 % nasal spray Place 2 sprays into both nostrils 2 (two) times daily. Use in each nostril as directed 30 mL 12  ? BLACK COHOSH EXTRACT PO Take 1 tablet by mouth daily.    ? cholecalciferol (VITAMIN D3) 25 MCG (1000 UNIT) tablet Take 1,000 Units by mouth daily.    ? Cinnamon 500 MG capsule  Take 1,000 mg by mouth 2 (two) times daily.    ? cyclobenzaprine (FLEXERIL) 10 MG tablet Take 1 tablet (10 mg total) by mouth at bedtime as needed for muscle spasms. 30 tablet 5  ? Dulaglutide (TRULICITY) 4.5 MB/8.4YK SOPN Inject 4.5 mg as directed once a week. 6 mL 3  ? Echinacea-Goldenseal (ECHINACEA COMB/GOLDEN SEAL PO) Take 1 capsule by mouth daily as needed (Allergies).    ? fluconazole (DIFLUCAN) 150 MG tablet Take 1 tablet (150 mg total) by mouth daily. Take second tab 3days apart from first tab 2 tablet 0  ? fluocinonide cream (LIDEX) 5.99 % Apply 1 application topically 2 (two) times daily as needed (Itching).    ? fluticasone (FLONASE) 50 MCG/ACT nasal spray Place 2 sprays into both nostrils as needed for allergies or rhinitis.    ? furosemide (LASIX) 40 MG tablet Take 40 mg by mouth as needed for edema.    ? gabapentin (NEURONTIN) 300 MG capsule Take 1 capsule (300 mg total) by mouth 2 (two) times daily. 180 capsule 3  ? levocetirizine (XYZAL) 5 MG tablet Take 1 tablet (5 mg total) by mouth every evening. 90 tablet 3  ? losartan (COZAAR) 50 MG tablet Take 1 tablet (50 mg total) by mouth daily. 90 tablet 3  ? meloxicam (MOBIC) 15 MG tablet Take 1 tablet (15 mg total) by mouth daily. With food 90 tablet 1  ? metFORMIN (GLUCOPHAGE-XR) 500 MG 24 hr tablet Take 2 tablets (1,000 mg total) by mouth daily with breakfast. 180 tablet 3  ? nitroGLYCERIN (NITRODUR - DOSED IN MG/24 HR) 0.2 mg/hr patch Place 0.2 mg onto the skin daily. Apply 1/4 patch daily as needed for tondonitis    ? Omega-3 1000 MG CAPS Take 1 g by mouth daily.    ? omeprazole (PRILOSEC) 20 MG capsule Take 1 capsule (20 mg total) by mouth daily. 90 capsule 1  ? vitamin B-12 (CYANOCOBALAMIN) 100 MCG tablet Take 100 mcg by mouth daily.    ? vitamin C (ASCORBIC ACID) 500 MG tablet Take 1,000 mg by mouth daily.    ? vitamin E 400 UNIT capsule Take 400 Units by mouth daily. Reported on 02/13/2016    ? ?No current facility-administered medications on file  prior to visit.  ? ? ?Allergies  ?Allergen Reactions  ? Penicillins Hives  ?  Hives   ? Tramadol Itching  ? ? ?Family History  ?Problem Relation Age of Onset  ? Diabetes Mother   ?     Living  ? Hyperlipidemia Mother   ?  Tuberculosis Mother   ? Asthma Mother   ? Diabetes Father 65  ?     Deceased  ? Sudden death Father   ? Diabetes Sister   ? Diabetes Brother   ? Kidney failure Brother 90  ?     Diseased  ? Healthy Son   ?     x1  ? Heart disease Maternal Aunt   ? Heart disease Maternal Uncle   ? Colon cancer Maternal Uncle   ?     dx in 25's  ? Breast cancer Cousin 34  ? Arthritis Other   ?     Maternal Aunts & Uncles  ? Esophageal cancer Neg Hx   ? Rectal cancer Neg Hx   ? Stomach cancer Neg Hx   ? ? ?BP 130/90 (BP Location: Left Arm, Patient Position: Sitting, Cuff Size: Normal)   Pulse 73   Ht '5\' 5"'$  (1.651 m)   Wt 208 lb 12.8 oz (94.7 kg)   SpO2 99%   BMI 34.75 kg/m?  ? ? ?Review of Systems ?She denies hypoglycemia.   ?   ?Objective:  ? Physical Exam ?VITAL SIGNS:  See vs page.   ?GENERAL: no distress.   ? ? ?A1c=9.9% ?   ?Assessment & Plan:  ?Insulin-requiring type 2 DM: uncontrolled ? ?Patient Instructions  ?I have sent a prescription to your pharmacy, to increase the Tresiba to 70 units per day, and:  ?Please continue the same other medications.  ?check your blood sugar twice a day.  vary the time of day when you check, between before the 3 meals, and at bedtime.  also check if you have symptoms of your blood sugar being too high or too low.  please keep a record of the readings and bring it to your next appointment here (or you can bring the meter itself).  You can write it on any piece of paper.  please call us sooner if your blood sugar goes below 70, or if you have a lot of readings over 200.   ?You should have an endocrinology follow-up appointment in 2 months.  ? ? ?

## 2022-05-01 ENCOUNTER — Encounter: Payer: Self-pay | Admitting: Nurse Practitioner

## 2022-05-01 ENCOUNTER — Other Ambulatory Visit: Payer: Self-pay | Admitting: Nurse Practitioner

## 2022-05-01 ENCOUNTER — Ambulatory Visit (INDEPENDENT_AMBULATORY_CARE_PROVIDER_SITE_OTHER): Payer: Managed Care, Other (non HMO) | Admitting: Nurse Practitioner

## 2022-05-01 VITALS — BP 122/82 | HR 89 | Temp 96.8°F | Ht 65.0 in | Wt 204.6 lb

## 2022-05-01 DIAGNOSIS — Z23 Encounter for immunization: Secondary | ICD-10-CM | POA: Diagnosis not present

## 2022-05-01 DIAGNOSIS — M5412 Radiculopathy, cervical region: Secondary | ICD-10-CM

## 2022-05-01 DIAGNOSIS — I10 Essential (primary) hypertension: Secondary | ICD-10-CM

## 2022-05-01 DIAGNOSIS — E785 Hyperlipidemia, unspecified: Secondary | ICD-10-CM

## 2022-05-01 DIAGNOSIS — Z0001 Encounter for general adult medical examination with abnormal findings: Secondary | ICD-10-CM | POA: Diagnosis not present

## 2022-05-01 DIAGNOSIS — Z794 Long term (current) use of insulin: Secondary | ICD-10-CM | POA: Diagnosis not present

## 2022-05-01 DIAGNOSIS — E1169 Type 2 diabetes mellitus with other specified complication: Secondary | ICD-10-CM

## 2022-05-01 DIAGNOSIS — R748 Abnormal levels of other serum enzymes: Secondary | ICD-10-CM

## 2022-05-01 DIAGNOSIS — Z6837 Body mass index (BMI) 37.0-37.9, adult: Secondary | ICD-10-CM

## 2022-05-01 DIAGNOSIS — E66812 Obesity, class 2: Secondary | ICD-10-CM

## 2022-05-01 DIAGNOSIS — Z1231 Encounter for screening mammogram for malignant neoplasm of breast: Secondary | ICD-10-CM

## 2022-05-01 LAB — MICROALBUMIN / CREATININE URINE RATIO
Creatinine,U: 52.3 mg/dL
Microalb Creat Ratio: 1.3 mg/g (ref 0.0–30.0)
Microalb, Ur: <0.7 mg/dL (ref 0.0–1.9)

## 2022-05-01 LAB — COMPREHENSIVE METABOLIC PANEL
ALT: 17 U/L (ref 0–35)
AST: 15 U/L (ref 0–37)
Albumin: 4.4 g/dL (ref 3.5–5.2)
Alkaline Phosphatase: 137 U/L — ABNORMAL HIGH (ref 39–117)
BUN: 16 mg/dL (ref 6–23)
CO2: 30 mEq/L (ref 19–32)
Calcium: 10.7 mg/dL — ABNORMAL HIGH (ref 8.4–10.5)
Chloride: 96 mEq/L (ref 96–112)
Creatinine, Ser: 0.88 mg/dL (ref 0.40–1.20)
GFR: 70.36 mL/min (ref >60.00)
Glucose, Bld: 274 mg/dL — ABNORMAL HIGH (ref 70–99)
Potassium: 3.9 mEq/L (ref 3.5–5.1)
Sodium: 135 mEq/L (ref 135–145)
Total Bilirubin: 0.5 mg/dL (ref 0.2–1.2)
Total Protein: 7.7 g/dL (ref 6.0–8.3)

## 2022-05-01 LAB — LIPID PANEL
Cholesterol: 180 mg/dL (ref 0–200)
HDL: 72.3 mg/dL (ref >39.00)
LDL Cholesterol: 86 mg/dL (ref 0–99)
NonHDL: 107.49
Total CHOL/HDL Ratio: 2
Triglycerides: 106 mg/dL (ref 0.0–149.0)
VLDL: 21.2 mg/dL (ref 0.0–40.0)

## 2022-05-01 MED ORDER — ATORVASTATIN CALCIUM 20 MG PO TABS: 20.0000 mg | ORAL_TABLET | Freq: Every day | ORAL | 3 refills | Status: DC

## 2022-05-01 MED ORDER — LOSARTAN POTASSIUM 50 MG PO TABS: 50.0000 mg | ORAL_TABLET | Freq: Every day | ORAL | 3 refills | Status: DC

## 2022-05-01 MED ORDER — GABAPENTIN 300 MG PO CAPS: 300.0000 mg | ORAL_CAPSULE | Freq: Two times a day (BID) | ORAL | 3 refills | Status: DC

## 2022-05-01 NOTE — Patient Instructions (Addendum)
Schedule mammogram at [facility]  Go to lab Maintain heart healthy diet and daily exercise. Sign medical release to get report from [facility].  Preventive Care 80-63 Years Old, Female Preventive care refers to lifestyle choices and visits with your health care provider that can promote health and wellness. Preventive care visits are also called wellness exams. What can I expect for my preventive care visit? Counseling Your health care provider may ask you questions about your: Medical history, including: Past medical problems. Family medical history. Pregnancy history. Current health, including: Menstrual cycle. Method of birth control. Emotional well-being. Home life and relationship well-being. Sexual activity and sexual health. Lifestyle, including: Alcohol, nicotine or tobacco, and drug use. Access to firearms. Diet, exercise, and sleep habits. Work and work Statistician. Sunscreen use. Safety issues such as seatbelt and bike helmet use. Physical exam Your health care provider will check your: Height and weight. These may be used to calculate your BMI (body mass index). BMI is a measurement that tells if you are at a healthy weight. Waist circumference. This measures the distance around your waistline. This measurement also tells if you are at a healthy weight and may help predict your risk of certain diseases, such as type 2 diabetes and high blood pressure. Heart rate and blood pressure. Body temperature. Skin for abnormal spots. What immunizations do I need?  Vaccines are usually given at various ages, according to a schedule. Your health care provider will recommend vaccines for you based on your age, medical history, and lifestyle or other factors, such as travel or where you work. What tests do I need? Screening Your health care provider may recommend screening tests for certain conditions. This may include: Lipid and cholesterol  levels. Diabetes screening. This is done by checking your blood sugar (glucose) after you have not eaten for a while (fasting). Pelvic exam and Pap test. Hepatitis B test. Hepatitis C test. HIV (human immunodeficiency virus) test. STI (sexually transmitted infection) testing, if you are at risk. Lung cancer screening. Colorectal cancer screening. Mammogram. Talk with your health care provider about when you should start having regular mammograms. This may depend on whether you have a family history of breast cancer. BRCA-related cancer screening. This may be done if you have a family history of breast, ovarian, tubal, or peritoneal cancers. Bone density scan. This is done to screen for osteoporosis. Talk with your health care provider about your test results, treatment options, and if necessary, the need for more tests. Follow these instructions at home: Eating and drinking  Eat a diet that includes fresh fruits and vegetables, whole grains, lean protein, and low-fat dairy products. Take vitamin and mineral supplements as recommended by your health care provider. Do not drink alcohol if: Your health care provider tells you not to drink. You are pregnant, may be pregnant, or are planning to become pregnant. If you drink alcohol: Limit how much you have to 0-1 drink a day. Know how much alcohol is in your drink. In the [place] one drink equals one 12 oz bottle of beer (355 mL), one 5 oz glass of wine (148 mL), or one 1 oz glass of hard liquor (44 mL). Lifestyle Brush your teeth every morning and night with fluoride toothpaste. Floss one time each day. Exercise for at least 30 minutes 5 or more days each week. Do not use any products that contain nicotine or tobacco. These products include cigarettes, chewing tobacco, and vaping devices, such as e-cigarettes. If you need  help quitting, ask your health care provider. Do not use drugs. If you are sexually active, practice safe sex. Use a  condom or other form of protection to prevent STIs. If you do not wish to become pregnant, use a form of birth control. If you plan to become pregnant, see your health care provider for a prepregnancy visit. Take aspirin only as told by your health care provider. Make sure that you understand how much to take and what form to take. Work with your health care provider to find out whether it is safe and beneficial for you to take aspirin daily. Find healthy ways to manage stress, such as: Meditation, yoga, or listening to music. Journaling. Talking to a trusted person. Spending time with friends and family. Minimize exposure to UV radiation to reduce your risk of skin cancer. Safety Always wear your seat belt while driving or riding in a vehicle. Do not drive: If you have been drinking alcohol. Do not ride with someone who has been drinking. When you are tired or distracted. While texting. If you have been using any mind-altering substances or drugs. Wear a helmet and other protective equipment during sports activities. If you have firearms in your house, make sure you follow all gun safety procedures. Seek help if you have been physically or sexually abused. What's next? Visit your health care provider once a year for an annual wellness visit. Ask your health care provider how often you should have your eyes and teeth checked. Stay up to date on all vaccines. This information is not intended to replace advice given to you by your health care provider. Make sure you discuss any questions you have with your health care provider. Document Revised: 05/24/2021 Document Reviewed: 05/24/2021 Elsevier Patient Education  [place].

## 2022-05-01 NOTE — Progress Notes (Signed)
Complete physical exam  Patient: [name]   DOB: 1961-10-07   61 y.o. Female  MRN: [id number] Visit Date: 05/01/2022  Subjective:    Chief Complaint  Patient presents with   Annual Exam    CPE Pt fasting Tdap & Shingles given today Breast exam done today.   [name] is a 61 y.o. female who presents today for a complete physical exam. She reports consuming a general diet.  none  She generally feels well. She reports sleeping well. She does not have additional problems to discuss today.  Vision:Yes Dental:Within Last 6 months STD Screen:No Ophthalmology: [facility]  Most recent fall risk assessment:    05/01/2022    8:58 AM  [place] in the past year? 0  Number falls in past yr: 0  Injury with Fall? 0     Most recent depression screenings:    05/01/2022    9:33 AM 05/18/2021    9:48 AM  PHQ 2/9 Scores  PHQ - 2 Score 3 0  PHQ- 9 Score 7 3    HPI  No problem-specific Assessment & Plan notes found for this encounter.   Past Medical History:  Diagnosis Date   Anemia    Arthritis    bilateral knees   Breast cancer screening 12/20/2015   Bronchitis    hx of   Carpal tunnel syndrome 12/29/2015   Cervical radiculopathy 11/28/2015   Diabetes mellitus without complication ([place]) 46/27/0350   Diastolic dysfunction 09/38/1829   Essential hypertension 06/13/2016   Fatty liver    Fibroid tumor    Had partial hysterectomy   Gallstones    Gastroesophageal reflux disease without esophagitis 03/11/2018   Heart burn    Joint pain    Left knee pain    bone on bone   Lesion of ulnar nerve 12/29/2015   Migraine    hx of   Obesity (BMI 30-39.9) 11/04/2019   Obesity (BMI 30.0-34.9) 03/31/2019   Primary osteoarthritis of both knees 10/10/2014   Severe sleep apnea 03/27/2021   Sleep apnea    SOBOE (shortness of breath on exertion)    Swelling of both lower extremities    Trigger finger, acquired 10/10/2014   Uncontrolled type 2 diabetes  mellitus with hyperglycemia ([place]) 04/15/2018   Visit for preventive health examination 12/20/2015   Past Surgical History:  Procedure Laterality Date   ABDOMINAL HYSTERECTOMY     partial    boil removed      CARPAL TUNNEL RELEASE  04/15/2015   CHOLECYSTECTOMY  09/10/2011   ELBOW SURGERY  04/15/2015   HAND SURGERY  04/15/2015   Nerve damage  04/15/2015   TOTAL KNEE ARTHROPLASTY  08/01/2012   Procedure: TOTAL KNEE ARTHROPLASTY;  Surgeon: Augustin Schooling, MD;  Location: [facility];  Service: Orthopedics;  Laterality: Right;  RIGHT TOTAL KNEE ARTHROPLASTY   TRIGGER FINGER RELEASE  04/15/2015   TUBAL LIGATION  09/1993   WISDOM TOOTH EXTRACTION     Social History   Socioeconomic History   Marital status: Single    Spouse name: Not on file   Number of children: 2   Years of education: Not on file   Highest education level: Not on file  Occupational History   Occupation: student   Occupation: Works in a group home  Tobacco Use   Smoking status: Former    Packs/day: 0.25    Years: 20.00    Pack years: 5.00    Types: Cigarettes  Quit date: 12/09/2009    Years since quitting: 12.4   Smokeless tobacco: Never  Vaping Use   Vaping Use: Never used  Substance and Sexual Activity   Alcohol use: Yes    Comment: social   Drug use: No   Sexual activity: Not Currently    Birth control/protection: Surgical  Other Topics Concern   Not on file  Social History Narrative   Not on file   Social Determinants of Health   Financial Resource Strain: Not on file  Food Insecurity: Not on file  Transportation Needs: Not on file  Physical Activity: Not on file  Stress: Not on file  Social Connections: Not on file  Intimate Partner Violence: Not on file   Family Status  Relation Name Status   Mother  Deceased   Father  Deceased   Sister  (Not Specified)   Brother  Deceased   Brother  Deceased   Son  (Not Specified)   Programmer, systems  (Not Specified)   [employer] social research officer  (Not  Specified)   Other  (Not Specified)   Neg Hx  (Not Specified)   Family History  Problem Relation Age of Onset   Diabetes Mother        Living   Hyperlipidemia Mother    Tuberculosis Mother    Asthma Mother    Diabetes Father 27       Deceased   Sudden death Father    Diabetes Sister    Diabetes Brother    Kidney failure Brother 43       Diseased   Healthy Son        x1   Heart disease Maternal Aunt    Heart disease Maternal Uncle    Colon cancer Maternal Uncle        dx in 26's   Breast cancer Cousin 34   Arthritis Other        Maternal Aunts & Uncles   Esophageal cancer Neg Hx    Rectal cancer Neg Hx    Stomach cancer Neg Hx    Allergies  Allergen Reactions   Penicillins Hives    Hives    Tramadol Itching    Patient Care Team: Jackquline Branca, Charlene Brooke, NP as PCP - General (Internal Medicine) Berniece Salines, DO as PCP - Cardiology (Cardiology) Phillipe Clemon Crumb, MD as Consulting Physician (Orthopedic Surgery)   Medications: Outpatient Medications Prior to Visit  Medication Sig   azelastine (ASTELIN) 0.1 % nasal spray Place 2 sprays into both nostrils 2 (two) times daily. Use in each nostril as directed   BLACK COHOSH EXTRACT PO Take 1 tablet by mouth daily.   cholecalciferol (VITAMIN D3) 25 MCG (1000 UNIT) tablet Take 1,000 Units by mouth daily.   Cinnamon 500 MG capsule Take 1,000 mg by mouth 2 (two) times daily.   cyclobenzaprine (FLEXERIL) 10 MG tablet Take 1 tablet (10 mg total) by mouth at bedtime as needed for muscle spasms.   Dulaglutide (TRULICITY) 4.5 JK/9.3OI SOPN Inject 4.5 mg as directed once a week.   Echinacea-Goldenseal (ECHINACEA COMB/GOLDEN SEAL PO) Take 1 capsule by mouth daily as needed (Allergies).   fluconazole (DIFLUCAN) 150 MG tablet Take 1 tablet (150 mg total) by mouth daily. Take second tab 3days apart from first tab   fluocinonide cream (LIDEX) 7.12 % Apply 1 application topically 2 (two) times daily as needed (Itching).   fluticasone  (FLONASE) 50 MCG/ACT nasal spray Place 2 sprays into both nostrils as needed  for allergies or rhinitis.   furosemide (LASIX) 40 MG tablet Take 40 mg by mouth as needed for edema.   insulin degludec (TRESIBA FLEXTOUCH) 200 UNIT/ML FlexTouch Pen Inject 70 Units into the skin daily.   JARDIANCE 25 MG TABS tablet TAKE 1 TABLET BY MOUTH EVERY DAY   levocetirizine (XYZAL) 5 MG tablet Take 1 tablet (5 mg total) by mouth every evening.   meloxicam (MOBIC) 15 MG tablet Take 1 tablet (15 mg total) by mouth daily. With food   metFORMIN (GLUCOPHAGE-XR) 500 MG 24 hr tablet Take 2 tablets (1,000 mg total) by mouth daily with breakfast.   nitroGLYCERIN (NITRODUR - DOSED IN MG/24 HR) 0.2 mg/hr patch Place 0.2 mg onto the skin daily. Apply 1/4 patch daily as needed for tondonitis   Omega-3 1000 MG CAPS Take 1 g by mouth daily.   omeprazole (PRILOSEC) 20 MG capsule Take 1 capsule (20 mg total) by mouth daily.   vitamin B-12 (CYANOCOBALAMIN) 100 MCG tablet Take 100 mcg by mouth daily.   vitamin C (ASCORBIC ACID) 500 MG tablet Take 1,000 mg by mouth daily.   vitamin E 400 UNIT capsule Take 400 Units by mouth daily. Reported on 02/13/2016   [DISCONTINUED] atorvastatin (LIPITOR) 20 MG tablet Take 1 tablet (20 mg total) by mouth at bedtime.   [DISCONTINUED] gabapentin (NEURONTIN) 300 MG capsule Take 1 capsule (300 mg total) by mouth 2 (two) times daily.   [DISCONTINUED] losartan (COZAAR) 50 MG tablet Take 1 tablet (50 mg total) by mouth daily.   No facility-administered medications prior to visit.   Review of Systems  Constitutional:  Negative for fatigue and fever.  HENT:  Negative for congestion and sore throat.   Eyes:        Negative for visual changes  Respiratory:  Negative for cough and shortness of breath.   Cardiovascular:  Negative for chest pain, palpitations and leg swelling.  Gastrointestinal:  Negative for blood in stool, constipation and diarrhea.  Genitourinary:  Negative for dysuria, frequency and  urgency.  Musculoskeletal:  Negative for myalgias.  Skin:  Negative for rash.  Neurological:  Negative for dizziness and headaches.  Hematological:  Does not bruise/bleed easily.  Psychiatric/Behavioral:  Negative for suicidal ideas. The patient is not nervous/anxious.        Objective:  BP 122/82 (BP Location: Right Arm, Patient Position: Sitting, Cuff Size: Normal)   Pulse 89   Temp (!) 96.8 F (36 C) (Temporal)   Ht '5\' 5"'$  (1.651 m)   Wt 204 lb 9.6 oz (92.8 kg)   SpO2 97%   BMI 34.05 kg/m     BP Readings from Last 3 Encounters:  05/01/22 122/82  04/02/22 130/90  02/05/22 122/78   Wt Readings from Last 3 Encounters:  05/01/22 204 lb 9.6 oz (92.8 kg)  04/02/22 208 lb 12.8 oz (94.7 kg)  02/05/22 206 lb 12.8 oz (93.8 kg)   Physical Exam Constitutional:      General: She is not in acute distress.    Appearance: She is obese.  HENT:     Right Ear: Tympanic membrane, ear canal and external ear normal.     Left Ear: Tympanic membrane, ear canal and external ear normal.     Nose: Nose normal.  Eyes:     General: No scleral icterus.    Extraocular Movements: Extraocular movements intact.     Conjunctiva/sclera: Conjunctivae normal.  Cardiovascular:     Rate and Rhythm: Normal rate and regular rhythm.  Pulses: Normal pulses.     Heart sounds: Normal heart sounds.  Pulmonary:     Effort: Pulmonary effort is normal. No respiratory distress.     Breath sounds: Normal breath sounds.  Chest:  Breasts:    Breasts are symmetrical.     Right: Normal.     Left: Normal.  Abdominal:     General: Bowel sounds are normal. There is no distension.     Palpations: Abdomen is soft.  Musculoskeletal:        General: Normal range of motion.     Cervical back: Normal range of motion and neck supple.     Right lower leg: No edema.     Left lower leg: No edema.  Lymphadenopathy:     Cervical: No cervical adenopathy.     Upper Body:     Right upper body: No supraclavicular,  axillary or pectoral adenopathy.     Left upper body: No supraclavicular, axillary or pectoral adenopathy.  Skin:    General: Skin is warm and dry.  Neurological:     Mental Status: She is alert and oriented to person, place, and time.  Psychiatric:        Mood and Affect: Mood normal.        Behavior: Behavior normal.        Thought Content: Thought content normal.    No results found for any visits on 05/01/22.    Assessment & Plan:    Routine Health Maintenance and Physical Exam  Immunization History  Administered Date(s) Administered   Influenza-Unspecified 09/09/2017   Moderna Sars-Covid-2 Vaccination 12/09/2019, 01/15/2020, 12/17/2020   PNEUMOCOCCAL CONJUGATE-20 10/30/2021   Pneumococcal Polysaccharide-23 03/11/2018   Tdap 05/01/2022   Zoster Recombinat (Shingrix) 05/01/2022    Health Maintenance  Topic Date Due   OPHTHALMOLOGY EXAM  03/10/2022   FOOT EXAM  04/20/2022   COVID-19 Vaccine (4 - Booster for Moderna series) 05/17/2022 (Originally 02/11/2021)   Zoster Vaccines- Shingrix (2 of 2) 06/26/2022   HEMOGLOBIN A1C  10/02/2022   MAMMOGRAM  04/30/2023   COLONOSCOPY (Pts 45-62yr Insurance coverage will need to be confirmed)  02/12/2026   TETANUS/TDAP  05/01/2032   Hepatitis C Screening  Completed   HIV Screening  Completed   HPV VACCINES  Aged Out   Discussed health benefits of physical activity, and encouraged her to engage in regular exercise appropriate for her age and condition.  Problem List Items Addressed This Visit       Cardiovascular and Mediastinum   Essential hypertension   Relevant Medications   atorvastatin (LIPITOR) 20 MG tablet   losartan (COZAAR) 50 MG tablet     Endocrine   DM (diabetes mellitus) (HCC)   Relevant Medications   atorvastatin (LIPITOR) 20 MG tablet   losartan (COZAAR) 50 MG tablet   Other Relevant Orders   Microalbumin / creatinine urine ratio   Hyperlipidemia associated with type 2 diabetes mellitus (HCC)   Relevant  Medications   atorvastatin (LIPITOR) 20 MG tablet   losartan (COZAAR) 50 MG tablet   Other Relevant Orders   Lipid panel     Nervous and Auditory   Cervical radiculopathy   Relevant Medications   gabapentin (NEURONTIN) 300 MG capsule     Other   Class 2 severe obesity with serious comorbidity and body mass index (BMI) of 37.0 to 37.9 in adult, unspecified obesity type ([facility]   Other Visit Diagnoses     Encounter for preventative adult health care exam with abnormal findings    -  Primary   Relevant Orders   Comprehensive metabolic panel   Varicella-zoster vaccine IM (Shingrix) (Completed)   Tdap vaccine greater than or equal to 7yo IM (Completed)   Need for Tdap vaccination       Relevant Orders   Tdap vaccine greater than or equal to 7yo IM (Completed)   Need for shingles vaccine       Relevant Orders   Varicella-zoster vaccine IM (Shingrix) (Completed)      Return in about 6 months (around 11/01/2022) for HTN, hyperlipidemia (fasting).     Wilfred Lacy, NP

## 2022-05-04 ENCOUNTER — Other Ambulatory Visit: Payer: Self-pay

## 2022-05-04 NOTE — Addendum Note (Signed)
Addended by: Leana Gamer on: 05/04/2022 02:34 PM   Modules accepted: Orders

## 2022-05-15 ENCOUNTER — Ambulatory Visit
Admission: RE | Admit: 2022-05-15 | Discharge: 2022-05-15 | Disposition: A | Discharge status: Home / Self Care | Payer: Managed Care, Other (non HMO) | Source: Ambulatory Visit | Attending: Nurse Practitioner | Admitting: Nurse Practitioner

## 2022-05-15 DIAGNOSIS — Z1231 Encounter for screening mammogram for malignant neoplasm of breast: Secondary | ICD-10-CM

## 2022-06-29 ENCOUNTER — Encounter: Payer: Self-pay | Admitting: Internal Medicine

## 2022-06-29 ENCOUNTER — Ambulatory Visit: Payer: Managed Care, Other (non HMO) | Admitting: Internal Medicine

## 2022-06-29 VITALS — BP 130/82 | HR 80 | Ht 65.0 in | Wt 206.6 lb

## 2022-06-29 DIAGNOSIS — E1165 Type 2 diabetes mellitus with hyperglycemia: Secondary | ICD-10-CM | POA: Diagnosis not present

## 2022-06-29 DIAGNOSIS — E785 Hyperlipidemia, unspecified: Secondary | ICD-10-CM | POA: Diagnosis not present

## 2022-06-29 DIAGNOSIS — E1159 Type 2 diabetes mellitus with other circulatory complications: Secondary | ICD-10-CM

## 2022-06-29 DIAGNOSIS — E1169 Type 2 diabetes mellitus with other specified complication: Secondary | ICD-10-CM

## 2022-06-29 LAB — POCT GLYCOSYLATED HEMOGLOBIN (HGB A1C): Hemoglobin A1C: 9.3 % — AB (ref 4.0–5.6)

## 2022-06-29 MED ORDER — TIRZEPATIDE 7.5 MG/0.5ML ~~LOC~~ SOAJ: 7.5000 mg | SUBCUTANEOUS | 3 refills | Status: DC

## 2022-06-29 NOTE — Patient Instructions (Addendum)
Please continue: - Metformin ER 1000 mg with breakfast - Jardiance 25 mg before breakfast - Tresiba 70 units daily   Try to change from: - Trulicity to Mounjaro 7.5 mg weekly  If you cannot get Munjaro, let me know so I can call in Glipizide.  NO SWEET DRINKS!  Please return in 3 months with your sugar log.   PATIENT INSTRUCTIONS FOR TYPE 2 DIABETES:  **Please join MyChart!** - see attached instructions about how to join if you have not done so already.  DIET AND EXERCISE Diet and exercise is an important part of diabetic treatment.  We recommended aerobic exercise in the form of brisk walking (working between 40-60% of maximal aerobic capacity, similar to brisk walking) for 150 minutes per week (such as 30 minutes five days per week) along with 3 times per week performing 'resistance' training (using various gauge rubber tubes with handles) 5-10 exercises involving the major muscle groups (upper body, lower body and core) performing 10-15 repetitions (or near fatigue) each exercise. Start at half the above goal but build slowly to reach the above goals. If limited by weight, joint pain, or disability, we recommend daily walking in a swimming pool with water up to waist to reduce pressure from joints while allow for adequate exercise.    BLOOD GLUCOSES Monitoring your blood glucoses is important for continued management of your diabetes. Please check your blood glucoses 2-4 times a day: fasting, before meals and at bedtime (you can rotate these measurements - e.g. one day check before the 3 meals, the next day check before 2 of the meals and before bedtime, etc.).   HYPOGLYCEMIA (low blood sugar) Hypoglycemia is usually a reaction to not eating, exercising, or taking too much insulin/ other diabetes drugs.  Symptoms include tremors, sweating, hunger, confusion, headache, etc. Treat IMMEDIATELY with 15 grams of Carbs: 4 glucose tablets  cup regular juice/soda 2 tablespoons raisins 4  teaspoons sugar 1 tablespoon honey Recheck blood glucose in 15 mins and repeat above if still symptomatic/blood glucose <100.  RECOMMENDATIONS TO REDUCE YOUR RISK OF DIABETIC COMPLICATIONS: * Take your prescribed MEDICATION(S) * Follow a DIABETIC diet: Complex carbs, fiber rich foods, (monounsaturated and polyunsaturated) fats * AVOID saturated/trans fats, high fat foods, >2,300 mg salt per day. * EXERCISE at least 5 times a week for 30 minutes or preferably daily.  * DO NOT SMOKE OR DRINK more than 1 drink a day. * Check your FEET every day. Do not wear tightfitting shoes. Contact us if you develop an ulcer * See your EYE doctor once a year or more if needed * Get a FLU shot once a year * Get a PNEUMONIA vaccine once before and once after age 39 years  GOALS:  * Your Hemoglobin A1c of <7%  * fasting sugars need to be <130 * after meals sugars need to be <180 (2h after you start eating) * Your Systolic BP should be 852 or lower  * Your Diastolic BP should be 80 or lower  * Your HDL (Good Cholesterol) should be 40 or higher  * Your LDL (Bad Cholesterol) should be 100 or lower. * Your Triglycerides should be 150 or lower  * Your Urine microalbumin (kidney function) should be <30 * Your Body Mass Index should be 25 or lower    Please consider the following ways to cut down carbs and fat and increase fiber and micronutrients in your diet: - substitute whole grain for white bread or pasta - substitute brown  rice for white rice - substitute 90-calorie flat bread pieces for slices of bread when possible - substitute sweet potatoes or yams for white potatoes - substitute humus for margarine - substitute tofu for cheese when possible - substitute almond or rice milk for regular milk (would not drink soy milk daily due to concern for soy estrogen influence on breast cancer risk) - substitute dark chocolate for other sweets when possible - substitute water - can add lemon or orange slices  for taste - for diet sodas (artificial sweeteners will trick your body that you can eat sweets without getting calories and will lead you to overeating and weight gain in the long run) - do not skip breakfast or other meals (this will slow down the metabolism and will result in more weight gain over time)  - can try smoothies made from fruit and almond/rice milk in am instead of regular breakfast - can also try old-fashioned (not instant) oatmeal made with almond/rice milk in am - order the dressing on the side when eating salad at a restaurant (pour less than half of the dressing on the salad) - eat as little meat as possible - can try juicing, but should not forget that juicing will get rid of the fiber, so would alternate with eating raw veg./fruits or drinking smoothies - use as little oil as possible, even when using olive oil - can dress a salad with a mix of balsamic vinegar and lemon juice, for e.g. - use agave nectar, stevia sugar, or regular sugar rather than artificial sweateners - steam or broil/roast veggies  - snack on veggies/fruit/nuts (unsalted, preferably) when possible, rather than processed foods - reduce or eliminate aspartame in diet (it is in diet sodas, chewing gum, etc) Read the labels!  Try to read Dr. Janene Harvey book: "Program for Reversing Diabetes" for other ideas for healthy eating.

## 2022-06-29 NOTE — Progress Notes (Signed)
Patient ID: [name], female   DOB: 05/15/1961, 61 y.o.   MRN: [id number]  HPI: [name] is a 61 y.o.-year-old female, returning for follow-up for DM2, dx in 2017 (glucose 400s while on steroids), insulin-dependent, uncontrolled, with complications (CAD, diastolic dysfunction, PN). Pt. previously saw Dr. Loanne Drilling, last visit 3 months ago.  She just returned from [place], where she had dietary indiscretions.  She also did not take her Trulicity while there. Sugars are higher.  Reviewed HbA1c: Lab Results  Component Value Date   HGBA1C 9.9 (A) 04/02/2022   HGBA1C 10.1 (A) 02/05/2022   HGBA1C 10.0 (A) 11/14/2021   HGBA1C 9.0 (H) 10/30/2021   HGBA1C 7.1 (A) 04/20/2021   HGBA1C 8.7 (A) 02/10/2021   HGBA1C 6.2 (A) 06/22/2020   HGBA1C 7.3 (A) 04/22/2020   HGBA1C 7.1 (A) 01/15/2020   HGBA1C 10.2 (A) 10/09/2019   Pt is on a regimen of: - Metformin ER 1000 mg with breakfast - Jardiance 25 mg before breakfast - Trulicity 4.5 mg weekly - Tresiba 70 units daily (dose increased 03/2022)  Pt checks her sugars 2x a day and they are: - am: 90-105 - 2h after b'fast: n/c - before lunch: n/c - 2h after lunch: n/c - before dinner: n/c - 2h after dinner: 170-300 - bedtime: n/c - nighttime: n/c Lowest sugar was 80-90; ? hypoglycemia awareness. Highest sugar was 300s - potatoes.  Glucometer: Contour  [facility] but not [facility], after she changed her insurance.  She is still trying to follow their diet plan.  She reduce his carbs.  However, she still eats hamburgers, hot dogs.  She drinks regular sodas.  - no CKD, last BUN/creatinine:  Lab Results  Component Value Date   BUN 16 05/01/2022   BUN 17 10/30/2021   CREATININE 0.88 05/01/2022   CREATININE 0.95 10/30/2021  On losartan 50 mg daily.  -+ HL; last set of lipids: Lab Results  Component Value Date   CHOL 180 05/01/2022   HDL 72.30 05/01/2022   LDLCALC 86 05/01/2022   TRIG 106.0  05/01/2022   CHOLHDL 2 05/01/2022  She is on Lipitor 20 mg daily.  Also, omega-3 fatty acids.  - last eye exam was in 2023. No DR reportedly.   - + numbness and tingling in her feet.  On Neurontin 300 mg twice a day.  Last foot exam 04/2021.  She also has a history of HTN, severe OSA, class II obesity, migraine, GERD, anemia, osteoarthritis, carpal tunnel, gallstones.  ROS: + see HPI No increased urination, blurry vision, nausea, chest pain.  Past Medical History:  Diagnosis Date   Anemia    Arthritis    bilateral knees   Breast cancer screening 12/20/2015   Bronchitis    hx of   Carpal tunnel syndrome 12/29/2015   Cervical radiculopathy 11/28/2015   Diabetes mellitus without complication ([place]) 66/29/4765   Diastolic dysfunction 46/50/3546   Essential hypertension 06/13/2016   Fatty liver    Fibroid tumor    Had partial hysterectomy   Gallstones    Gastroesophageal reflux disease without esophagitis 03/11/2018   Heart burn    Joint pain    Left knee pain    bone on bone   Lesion of ulnar nerve 12/29/2015   Migraine    hx of   Obesity (BMI 30-39.9) 11/04/2019   Obesity (BMI 30.0-34.9) 03/31/2019   Primary osteoarthritis of both knees 10/10/2014   Severe sleep apnea 03/27/2021   Sleep  apnea    SOBOE (shortness of breath on exertion)    Swelling of both lower extremities    Trigger finger, acquired 10/10/2014   Uncontrolled type 2 diabetes mellitus with hyperglycemia ([place]) 04/15/2018   Visit for preventive health examination 12/20/2015   Past Surgical History:  Procedure Laterality Date   ABDOMINAL HYSTERECTOMY     partial    boil removed      CARPAL TUNNEL RELEASE  04/15/2015   CHOLECYSTECTOMY  09/10/2011   ELBOW SURGERY  04/15/2015   HAND SURGERY  04/15/2015   Nerve damage  04/15/2015   TOTAL KNEE ARTHROPLASTY  08/01/2012   Procedure: TOTAL KNEE ARTHROPLASTY;  Surgeon: Augustin Schooling, MD;  Location: [facility];  Service: Orthopedics;  Laterality: Right;  RIGHT  TOTAL KNEE ARTHROPLASTY   TRIGGER FINGER RELEASE  04/15/2015   TUBAL LIGATION  09/1993   WISDOM TOOTH EXTRACTION     Social History   Socioeconomic History   Marital status: Single    Spouse name: Not on file   Number of children: 2   Years of education: Not on file   Highest education level: Not on file  Occupational History   Occupation: student   Occupation: Works in a group home  Tobacco Use   Smoking status: Former    Packs/day: 0.25    Years: 20.00    Total pack years: 5.00    Types: Cigarettes    Quit date: 12/09/2009    Years since quitting: 12.5   Smokeless tobacco: Never  Vaping Use   Vaping Use: Never used  Substance and Sexual Activity   Alcohol use: Yes    Comment: social   Drug use: No   Sexual activity: Not Currently    Birth control/protection: Surgical  Other Topics Concern   Not on file  Social History Narrative   Not on file   Social Determinants of Health   Financial Resource Strain: Not on file  Food Insecurity: Not on file  Transportation Needs: Not on file  Physical Activity: Not on file  Stress: Not on file  Social Connections: Not on file  Intimate Partner Violence: Not on file   Current Outpatient Medications on File Prior to Visit  Medication Sig Dispense Refill   atorvastatin (LIPITOR) 20 MG tablet Take 1 tablet (20 mg total) by mouth at bedtime. 90 tablet 3   azelastine (ASTELIN) 0.1 % nasal spray Place 2 sprays into both nostrils 2 (two) times daily. Use in each nostril as directed 30 mL 12   BLACK COHOSH EXTRACT PO Take 1 tablet by mouth daily.     cholecalciferol (VITAMIN D3) 25 MCG (1000 UNIT) tablet Take 1,000 Units by mouth daily.     Cinnamon 500 MG capsule Take 1,000 mg by mouth 2 (two) times daily.     cyclobenzaprine (FLEXERIL) 10 MG tablet Take 1 tablet (10 mg total) by mouth at bedtime as needed for muscle spasms. 30 tablet 5   Dulaglutide (TRULICITY) 4.5 VE/9.3YB SOPN Inject 4.5 mg as directed once a week. 6 mL 3    Echinacea-Goldenseal (ECHINACEA COMB/GOLDEN SEAL PO) Take 1 capsule by mouth daily as needed (Allergies).     fluconazole (DIFLUCAN) 150 MG tablet Take 1 tablet (150 mg total) by mouth daily. Take second tab 3days apart from first tab 2 tablet 0   fluocinonide cream (LIDEX) 0.17 % Apply 1 application topically 2 (two) times daily as needed (Itching).     fluticasone (FLONASE) 50 MCG/ACT nasal spray Place 2  sprays into both nostrils as needed for allergies or rhinitis.     furosemide (LASIX) 40 MG tablet Take 40 mg by mouth as needed for edema.     gabapentin (NEURONTIN) 300 MG capsule Take 1 capsule (300 mg total) by mouth 2 (two) times daily. 180 capsule 3   insulin degludec (TRESIBA FLEXTOUCH) 200 UNIT/ML FlexTouch Pen Inject 70 Units into the skin daily. 36 mL 1   JARDIANCE 25 MG TABS tablet TAKE 1 TABLET BY MOUTH EVERY DAY 90 tablet 3   levocetirizine (XYZAL) 5 MG tablet Take 1 tablet (5 mg total) by mouth every evening. 90 tablet 3   losartan (COZAAR) 50 MG tablet Take 1 tablet (50 mg total) by mouth daily. 90 tablet 3   meloxicam (MOBIC) 15 MG tablet Take 1 tablet (15 mg total) by mouth daily. With food 90 tablet 1   metFORMIN (GLUCOPHAGE-XR) 500 MG 24 hr tablet Take 2 tablets (1,000 mg total) by mouth daily with breakfast. 180 tablet 3   nitroGLYCERIN (NITRODUR - DOSED IN MG/24 HR) 0.2 mg/hr patch Place 0.2 mg onto the skin daily. Apply 1/4 patch daily as needed for tondonitis     Omega-3 1000 MG CAPS Take 1 g by mouth daily.     omeprazole (PRILOSEC) 20 MG capsule Take 1 capsule (20 mg total) by mouth daily. 90 capsule 1   vitamin B-12 (CYANOCOBALAMIN) 100 MCG tablet Take 100 mcg by mouth daily.     vitamin C (ASCORBIC ACID) 500 MG tablet Take 1,000 mg by mouth daily.     vitamin E 400 UNIT capsule Take 400 Units by mouth daily. Reported on 02/13/2016     No current facility-administered medications on file prior to visit.   Allergies  Allergen Reactions   Penicillins Hives    Hives     Tramadol Itching   Family History  Problem Relation Age of Onset   Diabetes Mother        Living   Hyperlipidemia Mother    Tuberculosis Mother    Asthma Mother    Diabetes Father 83       Deceased   Sudden death Father    Diabetes Sister    Diabetes Brother    Kidney failure Brother 44       Diseased   Healthy Son        x1   Heart disease Maternal Aunt    Heart disease Maternal Uncle    Colon cancer Maternal Uncle        dx in 11's   Breast cancer Cousin 34   Arthritis Other        Maternal Aunts & Uncles   Esophageal cancer Neg Hx    Rectal cancer Neg Hx    Stomach cancer Neg Hx     PE: BP 130/82 (BP Location: Right Arm, Patient Position: Sitting, Cuff Size: Normal)   Pulse 80   Ht '5\' 5"'$  (1.651 m)   Wt 206 lb 9.6 oz (93.7 kg)   SpO2 98%   BMI 34.38 kg/m  Wt Readings from Last 3 Encounters:  06/29/22 206 lb 9.6 oz (93.7 kg)  05/01/22 204 lb 9.6 oz (92.8 kg)  04/02/22 208 lb 12.8 oz (94.7 kg)   Constitutional: overweight, in NAD Eyes: EOMI, no exophthalmos ENT: moist mucous membranes, no thyromegaly, no cervical lymphadenopathy Cardiovascular: RRR, No MRG Respiratory: CTA B Musculoskeletal: no deformities Skin: moist, warm, no rashes Neurological: no tremor with outstretched hands Diabetic Foot Exam - Simple  Simple Foot Form Diabetic Foot exam was performed with the following findings: Yes 06/29/2022  1:52 PM  Visual Inspection No deformities, no ulcerations, no other skin breakdown bilaterally: Yes Sensation Testing Intact to touch and monofilament testing bilaterally: Yes Pulse Check Posterior Tibialis and Dorsalis pulse intact bilaterally: Yes Comments    ASSESSMENT: 1. DM2, insulin-dependent, uncontrolled, with complications - CAD - diast.  Dysfunction - PN  2. HL  PLAN:  1. Patient with long-standing, uncontrolled diabetes, on oral antidiabetic regimen with metformin and SGLT2 inhibitor, also long-acting insulin and weekly GLP-1 receptor  agonist, with still poor control.  Latest HbA1c was 9.9%, slightly better, but still very high, at last visit with Dr. Loanne Drilling 3 months ago. HbA1c today: 9.3% (lower). -At today's visit, sugars appear to be at goal in the morning, but they increase significantly until the end of the day, up to the 300s.  We discussed about the importance of improving diet, by cutting out fried foods, ideally also hamburgers and hotdogs and other highly processed foods, and, very importantly, cutting out sweet drinks.  With this measures alone her diabetes will likely improve significantly.  However, at this visit, I also suggested to try to switch from Trulicity to [facility].  This appears to be covered by her insurance.  I advised her to let me know if the sugars do not improve after switching to [facility].  In that case, we can try glipizide ER, and, if this is not enough, we can switch to mealtime insulin. -For now, I advised her to continue metformin, Jardiance, and the same dose of Tresiba.  At next visit, I am hoping we can reduce her Tresiba dose. - I suggested to:  Patient Instructions  Please continue: - Metformin ER 1000 mg with breakfast - Jardiance 25 mg before breakfast - Tresiba 70 units daily   Try to change from: - Trulicity to Mounjaro 7.5 mg weekly  If you cannot get Munjaro, let me know so I can call in Glipizide.  NO SWEET DRINKS!  Please return in 3 months with your sugar log.   - check sugars at different times of the day - check 2x a day, rotating checks - discussed about CBG targets for treatment: 80-130 mg/dL before meals and <180 mg/dL after meals; target HbA1c <7%. - given foot care handout  - given instructions for hypoglycemia management "15-15 rule"  - advised for yearly eye exams  - Return to clinic in 3 mo with sugar log   2. HL - Reviewed latest lipid panel from 04/2022: LDL above our target of less than 55 due to history of cardiovascular disease, the rest of fractions at  goal: Lab Results  Component Value Date   CHOL 180 05/01/2022   HDL 72.30 05/01/2022   LDLCALC 86 05/01/2022   TRIG 106.0 05/01/2022   CHOLHDL 2 05/01/2022  - Continues Lipitor 20 mg daily and omega-3 fatty acids without side effects. -She will definitely benefit from a change in diet.  Recommended to reduce fatty foods, also, not only carbs.  - Total time spent for the visit: 40 min, in precharting, reviewing Dr. Cordelia Pen last note, obtaining medical information from the chart and from the pt, reviewing her  previous labs, evaluations, and treatments, reviewing her symptoms, counseling her about her diabetes (please see the discussed topics above), and developing a plan to further treat it; she had a number of questions which I addressed   Philemon Kingdom, MD PhD [facility] Endocrinology

## 2022-06-30 ENCOUNTER — Other Ambulatory Visit: Payer: Self-pay | Admitting: Nurse Practitioner

## 2022-06-30 DIAGNOSIS — N760 Acute vaginitis: Secondary | ICD-10-CM

## 2022-07-16 ENCOUNTER — Other Ambulatory Visit: Payer: Managed Care, Other (non HMO)

## 2022-07-17 ENCOUNTER — Other Ambulatory Visit (INDEPENDENT_AMBULATORY_CARE_PROVIDER_SITE_OTHER): Payer: Managed Care, Other (non HMO)

## 2022-07-17 DIAGNOSIS — R748 Abnormal levels of other serum enzymes: Secondary | ICD-10-CM | POA: Diagnosis not present

## 2022-07-17 LAB — HEPATIC FUNCTION PANEL
ALT: 17 U/L (ref 0–35)
AST: 16 U/L (ref 0–37)
Albumin: 4.4 g/dL (ref 3.5–5.2)
Alkaline Phosphatase: 115 U/L (ref 39–117)
Bilirubin, Direct: 0.1 mg/dL (ref 0.0–0.3)
Total Bilirubin: 0.4 mg/dL (ref 0.2–1.2)
Total Protein: 7.8 g/dL (ref 6.0–8.3)

## 2022-07-18 ENCOUNTER — Encounter (INDEPENDENT_AMBULATORY_CARE_PROVIDER_SITE_OTHER): Payer: Self-pay

## 2022-08-03 NOTE — Telephone Encounter (Signed)
Na

## 2022-08-13 ENCOUNTER — Other Ambulatory Visit: Payer: Self-pay | Admitting: Nurse Practitioner

## 2022-08-13 DIAGNOSIS — N760 Acute vaginitis: Secondary | ICD-10-CM

## 2022-09-14 ENCOUNTER — Other Ambulatory Visit: Payer: Self-pay | Admitting: Nurse Practitioner

## 2022-09-14 DIAGNOSIS — N760 Acute vaginitis: Secondary | ICD-10-CM

## 2022-09-19 ENCOUNTER — Other Ambulatory Visit: Payer: Self-pay

## 2022-09-19 DIAGNOSIS — M5412 Radiculopathy, cervical region: Secondary | ICD-10-CM

## 2022-09-19 DIAGNOSIS — N760 Acute vaginitis: Secondary | ICD-10-CM

## 2022-09-19 MED ORDER — MELOXICAM 15 MG PO TABS: 15.0000 mg | ORAL_TABLET | Freq: Every day | ORAL | 0 refills | Status: DC

## 2022-10-11 ENCOUNTER — Encounter: Payer: Self-pay | Admitting: Internal Medicine

## 2022-10-11 ENCOUNTER — Ambulatory Visit: Payer: Managed Care, Other (non HMO) | Admitting: Internal Medicine

## 2022-10-11 VITALS — BP 120/80 | HR 86 | Ht 65.0 in | Wt 199.8 lb

## 2022-10-11 DIAGNOSIS — E785 Hyperlipidemia, unspecified: Secondary | ICD-10-CM

## 2022-10-11 DIAGNOSIS — E1169 Type 2 diabetes mellitus with other specified complication: Secondary | ICD-10-CM

## 2022-10-11 DIAGNOSIS — E1159 Type 2 diabetes mellitus with other circulatory complications: Secondary | ICD-10-CM

## 2022-10-11 DIAGNOSIS — E1165 Type 2 diabetes mellitus with hyperglycemia: Secondary | ICD-10-CM | POA: Diagnosis not present

## 2022-10-11 LAB — POCT GLYCOSYLATED HEMOGLOBIN (HGB A1C): Hemoglobin A1C: 9.6 % — AB (ref 4.0–5.6)

## 2022-10-11 MED ORDER — EMPAGLIFLOZIN 25 MG PO TABS: 25.0000 mg | ORAL_TABLET | Freq: Every day | ORAL | 3 refills | Status: DC

## 2022-10-11 MED ORDER — TIRZEPATIDE 10 MG/0.5ML ~~LOC~~ SOAJ: 10.0000 mg | SUBCUTANEOUS | 3 refills | Status: DC

## 2022-10-11 MED ORDER — METFORMIN HCL ER 500 MG PO TB24: 1000.0000 mg | ORAL_TABLET | Freq: Every day | ORAL | 3 refills | Status: DC

## 2022-10-11 MED ORDER — TRESIBA FLEXTOUCH 200 UNIT/ML ~~LOC~~ SOPN: 60.0000 [IU] | PEN_INJECTOR | Freq: Every day | SUBCUTANEOUS | 3 refills | Status: DC

## 2022-10-11 NOTE — Progress Notes (Signed)
Patient ID: [name], female   DOB: 1961-02-11, 61 y.o.   MRN: [id number]  HPI: [name] is a 61 y.o.-year-old female, returning for follow-up for DM2, dx in 2017 (glucose 400s while on steroids), insulin-dependent, uncontrolled, with complications (CAD, diastolic dysfunction, PN). Pt. previously saw Dr. Loanne Drilling, but last visit with me 3 months ago.  Interim history: No increased urination, blurry vision, nausea, chest pain. She was finally able to start Mounjaro 3 weeks after our last appt. She has no N/V/AP. Missed few doses 2/2 lack of availability at the pharmacy.  Reviewed HbA1c: Lab Results  Component Value Date   HGBA1C 9.3 (A) 06/29/2022   HGBA1C 9.9 (A) 04/02/2022   HGBA1C 10.1 (A) 02/05/2022   HGBA1C 10.0 (A) 11/14/2021   HGBA1C 9.0 (H) 10/30/2021   HGBA1C 7.1 (A) 04/20/2021   HGBA1C 8.7 (A) 02/10/2021   HGBA1C 6.2 (A) 06/22/2020   HGBA1C 7.3 (A) 04/22/2020   HGBA1C 7.1 (A) 01/15/2020   Pt is on a regimen of: - Metformin ER 1000 mg with breakfast - Jardiance 25 mg before breakfast - Trulicity 4.5 mg weekly >> Mounjaro 7.5 mg weekly - Tresiba 70 units daily (dose increased 03/2022)  Pt checks her sugars 2x a day and they are: - am: 90-105 >> 82-115 - 2h after b'fast: n/c - before lunch: n/c - 2h after lunch: n/c - before dinner: 115-150 - 2h after dinner: 170-300 >> 150-280 - bedtime: n/c - nighttime: n/c Lowest sugar was 80-90 >> 82; ? hypoglycemia awareness. Highest sugar was 300s - potatoes >> 280 (once a week - cheat days).  Glucometer: Contour  [facility] but not [facility], after she changed her insurance.  She is still trying to follow their diet plan.  She reduce his carbs.  However, she still eats hamburgers, hot dogs.  She drinks regular sodas.  - no CKD, last BUN/creatinine:  Lab Results  Component Value Date   BUN 16 05/01/2022   BUN 17 10/30/2021   CREATININE 0.88 05/01/2022   CREATININE 0.95  10/30/2021  On losartan 50 mg daily.  -+ HL; last set of lipids: Lab Results  Component Value Date   CHOL 180 05/01/2022   HDL 72.30 05/01/2022   LDLCALC 86 05/01/2022   TRIG 106.0 05/01/2022   CHOLHDL 2 05/01/2022  She is on Lipitor 20 mg daily.  Also, omega-3 fatty acids.  - last eye exam was in 2023. No DR reportedly.   - + numbness and tingling in her feet.  On Neurontin 300 mg twice a day.  Last foot exam 04/2021.  She also has a history of HTN, severe OSA, class II obesity, migraine, GERD, anemia, osteoarthritis, carpal tunnel, gallstones.  ROS: + see HPI No increased urination, blurry vision, nausea, chest pain.  Past Medical History:  Diagnosis Date   Anemia    Arthritis    bilateral knees   Breast cancer screening 12/20/2015   Bronchitis    hx of   Carpal tunnel syndrome 12/29/2015   Cervical radiculopathy 11/28/2015   Diabetes mellitus without complication ([place]) 41/32/4401   Diastolic dysfunction 02/72/5366   Essential hypertension 06/13/2016   Fatty liver    Fibroid tumor    Had partial hysterectomy   Gallstones    Gastroesophageal reflux disease without esophagitis 03/11/2018   Heart burn    Joint pain    Left knee pain    bone on bone   Lesion of ulnar nerve 12/29/2015  Migraine    hx of   Obesity (BMI 30-39.9) 11/04/2019   Obesity (BMI 30.0-34.9) 03/31/2019   Primary osteoarthritis of both knees 10/10/2014   Severe sleep apnea 03/27/2021   Sleep apnea    SOBOE (shortness of breath on exertion)    Swelling of both lower extremities    Trigger finger, acquired 10/10/2014   Uncontrolled type 2 diabetes mellitus with hyperglycemia ([place]) 04/15/2018   Visit for preventive health examination 12/20/2015   Past Surgical History:  Procedure Laterality Date   ABDOMINAL HYSTERECTOMY     partial    boil removed      CARPAL TUNNEL RELEASE  04/15/2015   CHOLECYSTECTOMY  09/10/2011   ELBOW SURGERY  04/15/2015   HAND SURGERY  04/15/2015   Nerve damage   04/15/2015   TOTAL KNEE ARTHROPLASTY  08/01/2012   Procedure: TOTAL KNEE ARTHROPLASTY;  Surgeon: Augustin Schooling, MD;  Location: [facility];  Service: Orthopedics;  Laterality: Right;  RIGHT TOTAL KNEE ARTHROPLASTY   TRIGGER FINGER RELEASE  04/15/2015   TUBAL LIGATION  09/1993   WISDOM TOOTH EXTRACTION     Social History   Socioeconomic History   Marital status: Single    Spouse name: Not on file   Number of children: 2   Years of education: Not on file   Highest education level: Not on file  Occupational History   Occupation: student   Occupation: Works in a group home  Tobacco Use   Smoking status: Former    Packs/day: 0.25    Years: 20.00    Total pack years: 5.00    Types: Cigarettes    Quit date: 12/09/2009    Years since quitting: 12.8   Smokeless tobacco: Never  Vaping Use   Vaping Use: Never used  Substance and Sexual Activity   Alcohol use: Yes    Comment: social   Drug use: No   Sexual activity: Not Currently    Birth control/protection: Surgical  Other Topics Concern   Not on file  Social History Narrative   Not on file   Social Determinants of Health   Financial Resource Strain: Not on file  Food Insecurity: Not on file  Transportation Needs: Not on file  Physical Activity: Not on file  Stress: Not on file  Social Connections: Not on file  Intimate Partner Violence: Not on file   Current Outpatient Medications on File Prior to Visit  Medication Sig Dispense Refill   atorvastatin (LIPITOR) 20 MG tablet Take 1 tablet (20 mg total) by mouth at bedtime. 90 tablet 3   azelastine (ASTELIN) 0.1 % nasal spray Place 2 sprays into both nostrils 2 (two) times daily. Use in each nostril as directed 30 mL 12   BLACK COHOSH EXTRACT PO Take 1 tablet by mouth daily.     cholecalciferol (VITAMIN D3) 25 MCG (1000 UNIT) tablet Take 1,000 Units by mouth daily.     Cinnamon 500 MG capsule Take 1,000 mg by mouth 2 (two) times daily.     cyclobenzaprine (FLEXERIL) 10 MG  tablet Take 1 tablet (10 mg total) by mouth at bedtime as needed for muscle spasms. 30 tablet 5   Dulaglutide (TRULICITY) 4.5 MW/4.1LK SOPN Inject 4.5 mg as directed once a week. 6 mL 3   Echinacea-Goldenseal (ECHINACEA COMB/GOLDEN SEAL PO) Take 1 capsule by mouth daily as needed (Allergies).     fluconazole (DIFLUCAN) 150 MG tablet Take 1 tablet (150 mg total) by mouth daily. Take second tab 3days apart from  first tab 2 tablet 0   fluocinonide cream (LIDEX) 3.08 % Apply 1 application topically 2 (two) times daily as needed (Itching).     fluticasone (FLONASE) 50 MCG/ACT nasal spray Place 2 sprays into both nostrils as needed for allergies or rhinitis.     furosemide (LASIX) 40 MG tablet Take 40 mg by mouth as needed for edema.     gabapentin (NEURONTIN) 300 MG capsule Take 1 capsule (300 mg total) by mouth 2 (two) times daily. 180 capsule 3   insulin degludec (TRESIBA FLEXTOUCH) 200 UNIT/ML FlexTouch Pen Inject 70 Units into the skin daily. 36 mL 1   JARDIANCE 25 MG TABS tablet TAKE 1 TABLET BY MOUTH EVERY DAY 90 tablet 3   levocetirizine (XYZAL) 5 MG tablet Take 1 tablet (5 mg total) by mouth every evening. 90 tablet 3   losartan (COZAAR) 50 MG tablet Take 1 tablet (50 mg total) by mouth daily. 90 tablet 3   meloxicam (MOBIC) 15 MG tablet Take 1 tablet (15 mg total) by mouth daily. With food 30 tablet 0   metFORMIN (GLUCOPHAGE-XR) 500 MG 24 hr tablet Take 2 tablets (1,000 mg total) by mouth daily with breakfast. 180 tablet 3   nitroGLYCERIN (NITRODUR - DOSED IN MG/24 HR) 0.2 mg/hr patch Place 0.2 mg onto the skin daily. Apply 1/4 patch daily as needed for tondonitis     Omega-3 1000 MG CAPS Take 1 g by mouth daily.     omeprazole (PRILOSEC) 20 MG capsule Take 1 capsule (20 mg total) by mouth daily. 90 capsule 1   tirzepatide (MOUNJARO) 7.5 MG/0.5ML Pen Inject 7.5 mg into the skin once a week. 2 mL 3   vitamin B-12 (CYANOCOBALAMIN) 100 MCG tablet Take 100 mcg by mouth daily.     vitamin C  (ASCORBIC ACID) 500 MG tablet Take 1,000 mg by mouth daily.     vitamin E 400 UNIT capsule Take 400 Units by mouth daily. Reported on 02/13/2016     No current facility-administered medications on file prior to visit.   Allergies  Allergen Reactions   Penicillins Hives    Hives    Tramadol Itching   Family History  Problem Relation Age of Onset   Diabetes Mother        Living   Hyperlipidemia Mother    Tuberculosis Mother    Asthma Mother    Diabetes Father 75       Deceased   Sudden death Father    Diabetes Sister    Diabetes Brother    Kidney failure Brother 40       Diseased   Healthy Son        x1   Heart disease Maternal Aunt    Heart disease Maternal Uncle    Colon cancer Maternal Uncle        dx in 48's   Breast cancer Cousin 34   Arthritis Other        Maternal Aunts & Uncles   Esophageal cancer Neg Hx    Rectal cancer Neg Hx    Stomach cancer Neg Hx     PE: BP 120/80 (BP Location: Right Arm, Patient Position: Sitting, Cuff Size: Normal)   Pulse 86   Ht '5\' 5"'$  (1.651 m)   Wt 199 lb 12.8 oz (90.6 kg)   SpO2 96%   BMI 33.25 kg/m  Wt Readings from Last 3 Encounters:  10/11/22 199 lb 12.8 oz (90.6 kg)  06/29/22 206 lb 9.6 oz (93.7 kg)  05/01/22 204 lb 9.6 oz (92.8 kg)   Constitutional: overweight, in NAD Eyes: no exophthalmos ENT: no thyromegaly, no cervical lymphadenopathy Cardiovascular: RRR, No MRG Respiratory: CTA B Musculoskeletal: no deformities Skin: moist, warm, no rashes Neurological: no tremor with outstretched hands Diabetic Foot Exam - Simple   Simple Foot Form Diabetic Foot exam was performed with the following findings: Yes 10/11/2022 11:01 AM  Visual Inspection No deformities, no ulcerations, no other skin breakdown bilaterally: Yes Sensation Testing Intact to touch and monofilament testing bilaterally: Yes Pulse Check Posterior Tibialis and Dorsalis pulse intact bilaterally: Yes Comments L hallux toenail thick (after toe  fracture)    ASSESSMENT: 1. DM2, insulin-dependent, uncontrolled, with complications - CAD - diast.  Dysfunction - PN  2. HL  PLAN:  1. Patient with, type 2 diabetes, on tight diabetic regimen with metformin and SGLT2 inhibitor, also, long-acting insulin and GLP-1/GIP receptor agonist, with still poor control.  At last visit, HbA1c was slightly lower than before, at 9.3%, but still high.  Sugars appears to be at goal in the morning but they were increasing significantly towards the end of the day, up to 300s.  We discussed about improving diet by cutting out fried foods, and ideally all fast  and highly processed foods, and, very importantly, cutting out sweet drinks.  I also advised her to switch from Trulicity to [facility].  We did not change the rest of the regimen.  We did discuss that if she could not start Mounjaro to let me know so that we could try glipizide ER. -At today's visit, sugars remain at goal in the morning but they are still high after dinner, especially once a week, doing cheat days.  I advised her to try to eliminate these.  She still drinks sweet drinks and I advised her to stop them.  Since she tolerates Mounjaro well, we will go ahead and increase the dose to 10 mg weekly.  In the meantime, since her sugars in the morning are much lower than the ones in the evening, I advised her to reduce her Tresiba dose.  We will continue metformin and Jardiance.   -I refilled her prescriptions. - I suggested to:  Patient Instructions  Please continue: - Metformin ER 1000 mg with breakfast - Jardiance 25 mg before breakfast  Decrease: - Tresiba 60 units daily   Increase: - Mounjaro 10 mg weekly  NO SWEET DRINKS!  Please return in 3-4 months.   - we checked her HbA1c: 9.6% (higher) - advised to check sugars at different times of the day - 1-2x a day, rotating check times - advised for yearly eye exams >> she is UTD - return to clinic in 3-4 months   2. HL -Reviewed  latest lipid panel from 04/2022: LDL above our target of less than 55 due to history of cardiovascular disease, the rest the fractions at goal: Lab Results  Component Value Date   CHOL 180 05/01/2022   HDL 72.30 05/01/2022   LDLCALC 86 05/01/2022   TRIG 106.0 05/01/2022   CHOLHDL 2 05/01/2022  -She continues on Lipitor 20 mg daily and omega-3 fatty acids without side effects -We discussed at last visit about improving diet.  I recommended to reduce not only carbs, but also fatty foods  Philemon Kingdom, MD PhD [facility] Endocrinology

## 2022-10-11 NOTE — Patient Instructions (Addendum)
Please continue: - Metformin ER 1000 mg with breakfast - Jardiance 25 mg before breakfast  Decrease: - Tresiba 60 units daily   Increase: - Mounjaro 10 mg weekly  NO SWEET DRINKS!  Please return in 3-4 months.

## 2022-11-05 ENCOUNTER — Other Ambulatory Visit: Payer: Self-pay | Admitting: Nurse Practitioner

## 2022-11-05 ENCOUNTER — Encounter: Payer: Self-pay | Admitting: Nurse Practitioner

## 2022-11-05 ENCOUNTER — Ambulatory Visit: Payer: Managed Care, Other (non HMO) | Admitting: Nurse Practitioner

## 2022-11-05 VITALS — BP 120/78 | HR 71 | Temp 96.8°F | Ht 65.0 in | Wt 198.4 lb

## 2022-11-05 DIAGNOSIS — E1169 Type 2 diabetes mellitus with other specified complication: Secondary | ICD-10-CM | POA: Diagnosis not present

## 2022-11-05 DIAGNOSIS — Z23 Encounter for immunization: Secondary | ICD-10-CM | POA: Diagnosis not present

## 2022-11-05 DIAGNOSIS — I10 Essential (primary) hypertension: Secondary | ICD-10-CM | POA: Diagnosis not present

## 2022-11-05 DIAGNOSIS — M5412 Radiculopathy, cervical region: Secondary | ICD-10-CM

## 2022-11-05 DIAGNOSIS — E785 Hyperlipidemia, unspecified: Secondary | ICD-10-CM | POA: Diagnosis not present

## 2022-11-05 DIAGNOSIS — J301 Allergic rhinitis due to pollen: Secondary | ICD-10-CM | POA: Insufficient documentation

## 2022-11-05 DIAGNOSIS — K219 Gastro-esophageal reflux disease without esophagitis: Secondary | ICD-10-CM | POA: Diagnosis not present

## 2022-11-05 DIAGNOSIS — R609 Edema, unspecified: Secondary | ICD-10-CM | POA: Insufficient documentation

## 2022-11-05 LAB — HEPATIC FUNCTION PANEL
ALT: 16 U/L (ref 0–35)
AST: 13 U/L (ref 0–37)
Albumin: 4.7 g/dL (ref 3.5–5.2)
Alkaline Phosphatase: 126 U/L — ABNORMAL HIGH (ref 39–117)
Bilirubin, Direct: 0.1 mg/dL (ref 0.0–0.3)
Total Bilirubin: 0.7 mg/dL (ref 0.2–1.2)
Total Protein: 7.5 g/dL (ref 6.0–8.3)

## 2022-11-05 LAB — BASIC METABOLIC PANEL
BUN: 13 mg/dL (ref 6–23)
CO2: 29 mEq/L (ref 19–32)
Calcium: 10.3 mg/dL (ref 8.4–10.5)
Chloride: 93 mEq/L — ABNORMAL LOW (ref 96–112)
Creatinine, Ser: 0.91 mg/dL (ref 0.40–1.20)
GFR: 67.34 mL/min (ref >60.00)
Glucose, Bld: 338 mg/dL — ABNORMAL HIGH (ref 70–99)
Potassium: 4.1 mEq/L (ref 3.5–5.1)
Sodium: 133 mEq/L — ABNORMAL LOW (ref 135–145)

## 2022-11-05 MED ORDER — FUROSEMIDE 40 MG PO TABS: 40.0000 mg | ORAL_TABLET | Freq: Every day | ORAL | 2 refills | Status: DC | PRN

## 2022-11-05 MED ORDER — LEVOCETIRIZINE DIHYDROCHLORIDE 5 MG PO TABS: 5.0000 mg | ORAL_TABLET | Freq: Every evening | ORAL | 3 refills | Status: DC

## 2022-11-05 MED ORDER — MELOXICAM 15 MG PO TABS: 15.0000 mg | ORAL_TABLET | Freq: Every day | ORAL | 0 refills | Status: DC

## 2022-11-05 MED ORDER — CYCLOBENZAPRINE HCL 10 MG PO TABS: 10.0000 mg | ORAL_TABLET | Freq: Every evening | ORAL | 5 refills | Status: DC | PRN

## 2022-11-05 MED ORDER — OMEPRAZOLE 20 MG PO CPDR: 20.0000 mg | DELAYED_RELEASE_CAPSULE | Freq: Every day | ORAL | 3 refills | Status: DC

## 2022-11-05 MED ORDER — AZELASTINE HCL 0.1 % NA SOLN: 2.0000 | Freq: Two times a day (BID) | NASAL | 11 refills | Status: DC

## 2022-11-05 MED ORDER — MELOXICAM 15 MG PO TABS: 15.0000 mg | ORAL_TABLET | Freq: Every day | ORAL | 1 refills | Status: DC

## 2022-11-05 MED ORDER — FLUTICASONE PROPIONATE 50 MCG/ACT NA SUSP: 2.0000 | Freq: Every day | NASAL | 11 refills | Status: DC

## 2022-11-05 MED ORDER — FUROSEMIDE 20 MG PO TABS: 20.0000 mg | ORAL_TABLET | Freq: Every day | ORAL | 2 refills | Status: DC | PRN

## 2022-11-05 NOTE — Assessment & Plan Note (Signed)
BP at goal with losartan BP Readings from Last 3 Encounters:  11/05/22 120/78  10/11/22 120/80  06/29/22 130/82    Repeat BMP Maintain med dose

## 2022-11-05 NOTE — Assessment & Plan Note (Addendum)
Use of furosemide '40mg'$  dailyprn Edema worse with prolong walking and standing. No edema noted today Decreased fusoremide dose to '20mg'$  daily prn Refill sent Repeat bmp

## 2022-11-05 NOTE — Patient Instructions (Signed)
Go to lab Stop gabapentin Maintain current medication doses

## 2022-11-05 NOTE — Assessment & Plan Note (Signed)
LDL at goal with atorvastatin Maintain med dose

## 2022-11-05 NOTE — Assessment & Plan Note (Addendum)
Gabapentin has been ineffective at dose '300mg'$  TID. Advised to stop gabapentin Pain relief with meloxicam and flexeril Med refill sent

## 2022-11-05 NOTE — Progress Notes (Signed)
Established Patient Visit  Patient: [name]   DOB: 05/28/1961   61 y.o. Female  MRN: [id number] Visit Date: 11/05/2022  Subjective:    Chief Complaint  Patient presents with   Office Visit    HTN/ Hyperlipidemia  Doesn't check BP  Requesting records for eye exam  Shingles vaccine given today    HPI Cervical radiculopathy Gabapentin has been ineffective at dose '300mg'$  TID. Advised to stop gabapentin Pain relief with meloxicam and flexeril Med refill sent  Hyperlipidemia associated with type 2 diabetes mellitus ([place]) LDL at goal with atorvastatin Maintain med dose  Essential hypertension BP at goal with losartan BP Readings from Last 3 Encounters:  11/05/22 120/78  10/11/22 120/80  06/29/22 130/82    Repeat BMP Maintain med dose  Dependent edema Use of furosemide '40mg'$  dailyprn Edema worse with prolong walking and standing. No edema noted today Decreased fusoremide dose to '20mg'$  daily prn Refill sent Repeat bmp  Reviewed medical, surgical, and social history today  Medications: Outpatient Medications Prior to Visit  Medication Sig   atorvastatin (LIPITOR) 20 MG tablet Take 1 tablet (20 mg total) by mouth at bedtime.   BLACK COHOSH EXTRACT PO Take 1 tablet by mouth daily.   cholecalciferol (VITAMIN D3) 25 MCG (1000 UNIT) tablet Take 1,000 Units by mouth daily.   Cinnamon 500 MG capsule Take 1,000 mg by mouth 2 (two) times daily.   Echinacea-Goldenseal (ECHINACEA COMB/GOLDEN SEAL PO) Take 1 capsule by mouth daily as needed (Allergies).   empagliflozin (JARDIANCE) 25 MG TABS tablet Take 1 tablet (25 mg total) by mouth daily.   fluocinonide cream (LIDEX) 6.16 % Apply 1 application topically 2 (two) times daily as needed (Itching).   insulin degludec (TRESIBA FLEXTOUCH) 200 UNIT/ML FlexTouch Pen Inject 60 Units into the skin daily.   losartan (COZAAR) 50 MG tablet Take 1 tablet (50 mg total) by mouth daily.   metFORMIN (GLUCOPHAGE-XR) 500 MG 24  hr tablet Take 2 tablets (1,000 mg total) by mouth daily with breakfast.   nitroGLYCERIN (NITRODUR - DOSED IN MG/24 HR) 0.2 mg/hr patch Place 0.2 mg onto the skin daily. Apply 1/4 patch daily as needed for tondonitis   Omega-3 1000 MG CAPS Take 1 g by mouth daily.   tirzepatide (MOUNJARO) 10 MG/0.5ML Pen Inject 10 mg into the skin once a week.   vitamin B-12 (CYANOCOBALAMIN) 100 MCG tablet Take 100 mcg by mouth daily.   vitamin C (ASCORBIC ACID) 500 MG tablet Take 1,000 mg by mouth daily.   vitamin E 400 UNIT capsule Take 400 Units by mouth daily. Reported on 02/13/2016   [DISCONTINUED] azelastine (ASTELIN) 0.1 % nasal spray Place 2 sprays into both nostrils 2 (two) times daily. Use in each nostril as directed   [DISCONTINUED] cyclobenzaprine (FLEXERIL) 10 MG tablet Take 1 tablet (10 mg total) by mouth at bedtime as needed for muscle spasms.   [DISCONTINUED] fluticasone (FLONASE) 50 MCG/ACT nasal spray Place 2 sprays into both nostrils as needed for allergies or rhinitis.   [DISCONTINUED] furosemide (LASIX) 40 MG tablet Take 40 mg by mouth as needed for edema.   [DISCONTINUED] levocetirizine (XYZAL) 5 MG tablet Take 1 tablet (5 mg total) by mouth every evening.   [DISCONTINUED] meloxicam (MOBIC) 15 MG tablet Take 1 tablet (15 mg total) by mouth daily. With food   [DISCONTINUED] fluconazole (DIFLUCAN) 150 MG tablet Take 1 tablet (150 mg total) by mouth daily.  Take second tab 3days apart from first tab (Patient not taking: Reported on 11/05/2022)   [DISCONTINUED] gabapentin (NEURONTIN) 300 MG capsule Take 1 capsule (300 mg total) by mouth 2 (two) times daily. (Patient not taking: Reported on 11/05/2022)   [DISCONTINUED] omeprazole (PRILOSEC) 20 MG capsule Take 1 capsule (20 mg total) by mouth daily. (Patient not taking: Reported on 11/05/2022)   No facility-administered medications prior to visit.   Reviewed past medical and social history.   ROS per HPI above      Objective:  BP 120/78    Pulse 71   Temp (!) 96.8 F (36 C) (Temporal)   Ht '5\' 5"'$  (1.651 m)   Wt 198 lb 6.4 oz (90 kg)   SpO2 99%   BMI 33.02 kg/m      Physical Exam Vitals reviewed.  Cardiovascular:     Rate and Rhythm: Normal rate and regular rhythm.     Pulses: Normal pulses.     Heart sounds: Normal heart sounds.  Pulmonary:     Effort: Pulmonary effort is normal.     Breath sounds: Normal breath sounds.  Musculoskeletal:     Cervical back: Normal range of motion and neck supple.     Right lower leg: No edema.     Left lower leg: No edema.  Neurological:     Mental Status: She is alert and oriented to person, place, and time.     No results found for any visits on 11/05/22.    Assessment & Plan:    Problem List Items Addressed This Visit       Cardiovascular and Mediastinum   Essential hypertension - Primary    BP at goal with losartan BP Readings from Last 3 Encounters:  11/05/22 120/78  10/11/22 120/80  06/29/22 130/82    Repeat BMP Maintain med dose      Relevant Medications   furosemide (LASIX) 20 MG tablet   Other Relevant Orders   Basic metabolic panel     Respiratory   Seasonal allergic rhinitis due to pollen   Relevant Medications   levocetirizine (XYZAL) 5 MG tablet   azelastine (ASTELIN) 0.1 % nasal spray   fluticasone (FLONASE) 50 MCG/ACT nasal spray     Digestive   Gastroesophageal reflux disease without esophagitis   Relevant Medications   omeprazole (PRILOSEC) 20 MG capsule     Endocrine   Hyperlipidemia associated with type 2 diabetes mellitus ([facility])    LDL at goal with atorvastatin Maintain med dose      Relevant Medications   furosemide (LASIX) 20 MG tablet   Other Relevant Orders   Hepatic function panel     Nervous and Auditory   Cervical radiculopathy    Gabapentin has been ineffective at dose '300mg'$  TID. Advised to stop gabapentin Pain relief with meloxicam and flexeril Med refill sent      Relevant Medications   cyclobenzaprine  (FLEXERIL) 10 MG tablet   meloxicam (MOBIC) 15 MG tablet     Other   Dependent edema    Use of furosemide '40mg'$  dailyprn Edema worse with prolong walking and standing. No edema noted today Decreased fusoremide dose to '20mg'$  daily prn Refill sent Repeat bmp      Relevant Medications   furosemide (LASIX) 20 MG tablet   Other Visit Diagnoses     Need for shingles vaccine       Relevant Orders   Zoster Recombinant (Shingrix ) (Completed)      Return in about 6  months (around 05/06/2023) for HTN, hyperlipidemia (fasting).     Wilfred Lacy, NP

## 2023-01-02 ENCOUNTER — Other Ambulatory Visit: Payer: Self-pay | Admitting: Nurse Practitioner

## 2023-01-02 DIAGNOSIS — R609 Edema, unspecified: Secondary | ICD-10-CM

## 2023-01-31 ENCOUNTER — Other Ambulatory Visit: Payer: Self-pay | Admitting: Internal Medicine

## 2023-01-31 ENCOUNTER — Encounter: Payer: Self-pay | Admitting: Internal Medicine

## 2023-01-31 ENCOUNTER — Ambulatory Visit: Payer: Medicaid Other | Admitting: Internal Medicine

## 2023-01-31 ENCOUNTER — Telehealth: Payer: Self-pay

## 2023-01-31 VITALS — BP 128/84 | HR 86 | Ht 65.0 in | Wt 197.4 lb

## 2023-01-31 DIAGNOSIS — E1169 Type 2 diabetes mellitus with other specified complication: Secondary | ICD-10-CM

## 2023-01-31 DIAGNOSIS — E1165 Type 2 diabetes mellitus with hyperglycemia: Secondary | ICD-10-CM | POA: Diagnosis not present

## 2023-01-31 DIAGNOSIS — E1159 Type 2 diabetes mellitus with other circulatory complications: Secondary | ICD-10-CM | POA: Diagnosis not present

## 2023-01-31 DIAGNOSIS — E785 Hyperlipidemia, unspecified: Secondary | ICD-10-CM

## 2023-01-31 LAB — POCT GLYCOSYLATED HEMOGLOBIN (HGB A1C): Hemoglobin A1C: 12.2 % — AB (ref 4.0–5.6)

## 2023-01-31 MED ORDER — INSULIN PEN NEEDLE 32G X 4 MM MISC: 3 refills | Status: DC

## 2023-01-31 MED ORDER — TIRZEPATIDE 10 MG/0.5ML ~~LOC~~ SOAJ: 10.0000 mg | SUBCUTANEOUS | 3 refills | Status: DC

## 2023-01-31 MED ORDER — NOVOLOG FLEXPEN 100 UNIT/ML ~~LOC~~ SOPN: 8.0000 [IU] | PEN_INJECTOR | Freq: Three times a day (TID) | SUBCUTANEOUS | 1 refills | Status: DC

## 2023-01-31 NOTE — Progress Notes (Signed)
Patient ID: [name], female   DOB: 1961/07/14, 62 y.o.   MRN: [id number]  HPI: [name] is a 62 y.o.-year-old female, returning for follow-up for DM2, dx in 2017 (glucose 400s while on steroids), insulin-dependent, uncontrolled, with complications (CAD, diastolic dysfunction, PN). Pt. previously saw Dr. Loanne Drilling, but last visit with me 3.5 months ago.  Interim history: She has increased urination, but no blurry vision, nausea, chest pain. She was not able to obtain [facility] in the last 3 weeks.  Sugars increased.  Reviewed HbA1c: Lab Results  Component Value Date   HGBA1C 9.6 (A) 10/11/2022   HGBA1C 9.3 (A) 06/29/2022   HGBA1C 9.9 (A) 04/02/2022   HGBA1C 10.1 (A) 02/05/2022   HGBA1C 10.0 (A) 11/14/2021   HGBA1C 9.0 (H) 10/30/2021   HGBA1C 7.1 (A) 04/20/2021   HGBA1C 8.7 (A) 02/10/2021   HGBA1C 6.2 (A) 06/22/2020   HGBA1C 7.3 (A) 04/22/2020   Pt is on a regimen of: - Metformin ER 1000 mg with breakfast - Jardiance 25 mg before breakfast - Trulicity 4.5 mg weekly >> Mounjaro 7.5 >> 10 mg weekly >> off for 3 weeks b/c lack of availability - Tresiba 70 >> 60 >>  70 units daily   Pt checks her sugars 2x a day and they are:  - am: 90-105 >> 82-115 >> 76-280s - 2h after b'fast: n/c - before lunch: n/c - 2h after lunch: n/c - before dinner: 115-150 >> n/c - 2h after dinner: 170-300 >> 150-280 >> 150-200s - bedtime: n/c - nighttime: n/c Lowest sugar was 80-90 >> 82 >> 76; ? hypoglycemia awareness. Highest sugar was 300s - potatoes >> 280 (once a week - cheat days) >> 280.  Glucometer: Contour  [facility] not [facility], after she changed her insurance.  She is still trying to follow their diet plan.  At last visit she was still drinking regular sodas, but now switched to diet sodas.  She is also trying to reduce carbs.  - no CKD, last BUN/creatinine:  Lab Results  Component Value Date   BUN 13 11/05/2022   BUN 16 05/01/2022    CREATININE 0.91 11/05/2022   CREATININE 0.88 05/01/2022  On losartan 50 mg daily.  -+ HL; last set of lipids: Lab Results  Component Value Date   CHOL 180 05/01/2022   HDL 72.30 05/01/2022   LDLCALC 86 05/01/2022   TRIG 106.0 05/01/2022   CHOLHDL 2 05/01/2022  She is on Lipitor 20 mg daily.  Also, omega-3 fatty acids.  - last eye exam was in 03/2022. No DR.  - + numbness and tingling in her feet.  On Neurontin 300 mg twice a day.  Last foot exam 10/11/2022.  She also has a history of HTN, severe OSA, class II obesity, migraine, GERD, anemia, osteoarthritis, carpal tunnel, gallstones.  ROS: + see HPI  Past Medical History:  Diagnosis Date   Anemia    Anemia    Arthritis    bilateral knees   Breast cancer screening 12/20/2015   Bronchitis    hx of   Carpal tunnel syndrome 12/29/2015   Cervical radiculopathy 11/28/2015   Diabetes mellitus without complication ([place]) 123456   Diastolic dysfunction [id number]   Essential hypertension 06/13/2016   Fatty liver    Fibroid tumor    Had partial hysterectomy   Gallstones    Gastroesophageal reflux disease without esophagitis 03/11/2018   Heart burn    Joint pain    Left  knee pain    bone on bone   Lesion of ulnar nerve 12/29/2015   Migraine    hx of   Obesity (BMI 30-39.9) 11/04/2019   Obesity (BMI 30.0-34.9) 03/31/2019   Primary osteoarthritis of both knees 10/10/2014   Severe sleep apnea 03/27/2021   Sleep apnea    SOBOE (shortness of breath on exertion)    Swelling of both lower extremities    Trigger finger, acquired 10/10/2014   Uncontrolled type 2 diabetes mellitus with hyperglycemia ([place]) 04/15/2018   Visit for preventive health examination 12/20/2015   Past Surgical History:  Procedure Laterality Date   ABDOMINAL HYSTERECTOMY     partial    boil removed      CARPAL TUNNEL RELEASE  04/15/2015   CHOLECYSTECTOMY  09/10/2011   ELBOW SURGERY  04/15/2015   HAND SURGERY  04/15/2015   Nerve damage   04/15/2015   TOTAL KNEE ARTHROPLASTY  08/01/2012   Procedure: TOTAL KNEE ARTHROPLASTY;  Surgeon: Augustin Schooling, MD;  Location: [facility];  Service: Orthopedics;  Laterality: Right;  RIGHT TOTAL KNEE ARTHROPLASTY   TRIGGER FINGER RELEASE  04/15/2015   TUBAL LIGATION  09/1993   WISDOM TOOTH EXTRACTION     Social History   Socioeconomic History   Marital status: Single    Spouse name: Not on file   Number of children: 2   Years of education: Not on file   Highest education level: Not on file  Occupational History   Occupation: student   Occupation: Works in a group home  Tobacco Use   Smoking status: Former    Packs/day: 0.25    Years: 20.00    Total pack years: 5.00    Types: Cigarettes    Quit date: 12/09/2009    Years since quitting: 13.1   Smokeless tobacco: Never  Vaping Use   Vaping Use: Never used  Substance and Sexual Activity   Alcohol use: Yes    Comment: social   Drug use: No   Sexual activity: Not Currently    Birth control/protection: Surgical  Other Topics Concern   Not on file  Social History Narrative   Not on file   Social Determinants of Health   Financial Resource Strain: Not on file  Food Insecurity: Not on file  Transportation Needs: Not on file  Physical Activity: Not on file  Stress: Not on file  Social Connections: Not on file  Intimate Partner Violence: Not on file   Current Outpatient Medications on File Prior to Visit  Medication Sig Dispense Refill   atorvastatin (LIPITOR) 20 MG tablet Take 1 tablet (20 mg total) by mouth at bedtime. 90 tablet 3   azelastine (ASTELIN) 0.1 % nasal spray Place 2 sprays into both nostrils 2 (two) times daily. Use in each nostril as directed 30 mL 11   BLACK COHOSH EXTRACT PO Take 1 tablet by mouth daily.     cholecalciferol (VITAMIN D3) 25 MCG (1000 UNIT) tablet Take 1,000 Units by mouth daily.     Cinnamon 500 MG capsule Take 1,000 mg by mouth 2 (two) times daily.     cyclobenzaprine (FLEXERIL) 10 MG  tablet Take 1 tablet (10 mg total) by mouth at bedtime as needed for muscle spasms. 30 tablet 5   Echinacea-Goldenseal (ECHINACEA COMB/GOLDEN SEAL PO) Take 1 capsule by mouth daily as needed (Allergies).     empagliflozin (JARDIANCE) 25 MG TABS tablet Take 1 tablet (25 mg total) by mouth daily. 90 tablet 3   fluocinonide  cream (LIDEX) [id number] % Apply 1 application topically 2 (two) times daily as needed (Itching).     fluticasone (FLONASE) 50 MCG/ACT nasal spray Place 2 sprays into both nostrils daily. 16 g 11   furosemide (LASIX) 20 MG tablet TAKE 1 TABLET (20 MG TOTAL) BY MOUTH DAILY AS NEEDED FOR EDEMA. 90 tablet 2   insulin degludec (TRESIBA FLEXTOUCH) 200 UNIT/ML FlexTouch Pen Inject 60 Units into the skin daily. 27 mL 3   levocetirizine (XYZAL) 5 MG tablet Take 1 tablet (5 mg total) by mouth every evening. 90 tablet 3   losartan (COZAAR) 50 MG tablet Take 1 tablet (50 mg total) by mouth daily. 90 tablet 3   meloxicam (MOBIC) 15 MG tablet Take 1 tablet (15 mg total) by mouth daily. With food 90 tablet 1   metFORMIN (GLUCOPHAGE-XR) 500 MG 24 hr tablet Take 2 tablets (1,000 mg total) by mouth daily with breakfast. 180 tablet 3   nitroGLYCERIN (NITRODUR - DOSED IN MG/24 HR) 0.2 mg/hr patch Place 0.2 mg onto the skin daily. Apply 1/4 patch daily as needed for tondonitis     Omega-3 1000 MG CAPS Take 1 g by mouth daily.     omeprazole (PRILOSEC) 20 MG capsule Take 1 capsule (20 mg total) by mouth daily. 90 capsule 3   tirzepatide (MOUNJARO) 10 MG/0.5ML Pen Inject 10 mg into the skin once a week. 6 mL 3   vitamin B-12 (CYANOCOBALAMIN) 100 MCG tablet Take 100 mcg by mouth daily.     vitamin C (ASCORBIC ACID) 500 MG tablet Take 1,000 mg by mouth daily.     vitamin E 400 UNIT capsule Take 400 Units by mouth daily. Reported on 02/13/2016     No current facility-administered medications on file prior to visit.   Allergies  Allergen Reactions   Penicillins Hives    Hives    Tramadol Itching   Family  History  Problem Relation Age of Onset   Diabetes Mother        Living   Hyperlipidemia Mother    Tuberculosis Mother    Asthma Mother    Diabetes Father 92       Deceased   Sudden death Father    Diabetes Sister    Diabetes Brother    Kidney failure Brother 49       Diseased   Healthy Son        x1   Heart disease Maternal Aunt    Heart disease Maternal Uncle    Colon cancer Maternal Uncle        dx in 85's   Breast cancer Cousin 34   Arthritis Other        Maternal Aunts & Uncles   Esophageal cancer Neg Hx    Rectal cancer Neg Hx    Stomach cancer Neg Hx    PE: There were no vitals taken for this visit. Wt Readings from Last 3 Encounters:  11/05/22 198 lb 6.4 oz (90 kg)  10/11/22 199 lb 12.8 oz (90.6 kg)  06/29/22 206 lb 9.6 oz (93.7 kg)   Constitutional: overweight, in NAD Eyes: no exophthalmos ENT: no thyromegaly, no cervical lymphadenopathy Cardiovascular: RRR, No MRG Respiratory: CTA B Musculoskeletal: no deformities Skin: no rashes Neurological: no tremor with outstretched hands  ASSESSMENT: 1. DM2, insulin-dependent, uncontrolled, with complications - CAD - diast.  Dysfunction - PN  2. HL  PLAN:  1. Patient with uncontrolled type 2 diabetes, on oral antidiabetic regimen with metformin, SGLT2 inhibitor and also  long-acting insulin and GLP-1/GIP receptor agonist.  At last visit, HbA1c was higher, at 9.6%.  At that time, sugars were at goal in the morning but they were high after dinner especially during cheat days once a week.  I advised her to try to eliminate these.  She was still drinking sweet drinks and I strongly advised her to stop them.  We increased her Mounjaro dose and decreased her Tresiba dose at that time. -At today's visit, sugars appear to be higher after she had to come off Mounjaro 3 weeks ago.  Before this, she mentions that her sugars were mostly at goal.  However, HbA1c today is much higher than before, so I am thinking that we are  missing some very high blood sugars.  We discussed that she absolutely needs to avoid sweet drinks and to continue to reduce starches.  However, we will also need mealtime insulin, which I am hoping that we can reduce and possibly even stop in the future, especially if she is able to add back Mounjaro.  I gave her a written prescription for this to try different pharmacies. -We discussed about injecting NovoLog ~15 minutes before each meal and how to guide the insulin dose based on the size and consistency of the meals.  Will keep the rest of the regimen the same for now. - I suggested to:  Patient Instructions  Please continue: - Metformin ER 1000 mg with breakfast - Jardiance 25 mg before breakfast - Tresiba 70 units daily   Restart: - Mounjaro 10 mg weekly  Also use: - NovoLog 8-10 units 15 min before meals   NO SWEET DRINKS!  Please return in 2 months.  - we checked her HbA1c: 12.2% (MUCH higher) - advised to check sugars at different times of the day - 3x a day, rotating check times - advised for yearly eye exams >> she is UTD - return to clinic in 3-4 months   2. HL -Reviewed latest lipid panel from 04/2022: LDL above goal of less than 55 due to history of cardiovascular disease, otherwise fractions at goal: Lab Results  Component Value Date   CHOL 180 05/01/2022   HDL 72.30 05/01/2022   LDLCALC 86 05/01/2022   TRIG 106.0 05/01/2022   CHOLHDL 2 05/01/2022  -She is on Lipitor 20 mg daily and omega-3 fatty acids-no side effects  Philemon Kingdom, MD PhD [facility]

## 2023-01-31 NOTE — Patient Instructions (Addendum)
Please continue: - Metformin ER 1000 mg with breakfast - Jardiance 25 mg before breakfast - Tresiba 70 units daily   Restart: - Mounjaro 10 mg weekly  Also use: - NovoLog 8-10 units 15 min before meals   NO SWEET DRINKS!  Please return in 2 months.

## 2023-01-31 NOTE — Telephone Encounter (Signed)
Pt called to advise pharmacy needed to speak with the office. Pharmacy contacted and Novolog rx was corrected and being filled for pt. Mounjaro rx requires PA. Message sent to prior auth team to submit request.

## 2023-02-01 ENCOUNTER — Other Ambulatory Visit: Payer: Self-pay | Admitting: Internal Medicine

## 2023-02-01 DIAGNOSIS — E1165 Type 2 diabetes mellitus with hyperglycemia: Secondary | ICD-10-CM

## 2023-02-01 MED ORDER — INSULIN LISPRO (1 UNIT DIAL) 100 UNIT/ML (KWIKPEN): 8.0000 [IU] | PEN_INJECTOR | Freq: Three times a day (TID) | SUBCUTANEOUS | 1 refills | Status: DC

## 2023-02-01 NOTE — Addendum Note (Signed)
Addended by: Lauralyn Primes on: 02/01/2023 04:01 PM   Modules accepted: Orders

## 2023-02-07 ENCOUNTER — Other Ambulatory Visit (HOSPITAL_COMMUNITY): Payer: Self-pay

## 2023-02-08 ENCOUNTER — Other Ambulatory Visit (HOSPITAL_COMMUNITY): Payer: Self-pay

## 2023-02-08 NOTE — Telephone Encounter (Signed)
Pharmacy Patient Advocate Encounter   Received notification that prior authorization for Mounjaro '10mg'$ /0.54m is required/requested.    PA submitted on 02/08/23 to (ins) [employer] via [employer]# [id number] Status is pending

## 2023-02-11 NOTE — Telephone Encounter (Signed)
PA has been DENIED.   Denial letter has been attached in patient documents.

## 2023-02-12 NOTE — Telephone Encounter (Signed)
T, I guess we need to go to [facility] - UNFORTUNATELY - if she cannot tolerate it or not efficient, we can re-submit the PA.  Lets escalate the dose: 0.6 mg before breakfast for 3 days, then 1.2 mg for another 3 days, and then 1.8 mg daily

## 2023-02-14 ENCOUNTER — Other Ambulatory Visit (HOSPITAL_COMMUNITY): Payer: Self-pay

## 2023-02-18 ENCOUNTER — Other Ambulatory Visit (HOSPITAL_COMMUNITY): Payer: Self-pay

## 2023-03-22 ENCOUNTER — Ambulatory Visit: Payer: Medicaid Other | Admitting: Cardiology

## 2023-03-26 ENCOUNTER — Encounter: Payer: Self-pay | Admitting: Internal Medicine

## 2023-03-26 ENCOUNTER — Ambulatory Visit: Payer: Medicaid Other | Admitting: Internal Medicine

## 2023-03-26 DIAGNOSIS — E1159 Type 2 diabetes mellitus with other circulatory complications: Secondary | ICD-10-CM

## 2023-03-26 DIAGNOSIS — E1165 Type 2 diabetes mellitus with hyperglycemia: Secondary | ICD-10-CM | POA: Diagnosis not present

## 2023-03-26 LAB — POCT GLYCOSYLATED HEMOGLOBIN (HGB A1C): Hemoglobin A1C: 7.8 % — AB (ref 4.0–5.6)

## 2023-03-26 MED ORDER — CONTOUR NEXT TEST VI STRP: ORAL_STRIP | 2 refills | Status: DC

## 2023-03-26 NOTE — Progress Notes (Signed)
Patient ID: [name], female   DOB: 12-Jan-1961, 62 y.o.   MRN: [id number]  HPI: [name] is a 62 y.o.-year-old femalele, returning for follow-up for DM2, dx in 2017 (glucose 400s while on steroids), insulin-dependent, uncontrolled, with complications (CAD, diastolic dysfunction, PN). Pt. previously saw Dr. Everardo All, but last visit with me 2 months ago.  Interim history: No increased urination,  blurry vision, nausea, chest pain. She tells me that since last visit, she started to take her medications as prescribed and sugars are significantly better, despite the fact that he was not able to get [facility] and, due to the good control, she is not taking Humalog but only ~once a week.  Reviewed HbA1c: Lab Results  Component Value Date   HGBA1C 12.2 (A) 01/31/2023   HGBA1C 9.6 (A) 10/11/2022   HGBA1C 9.3 (A) 06/29/2022   HGBA1C 9.9 (A) 04/02/2022   HGBA1C 10.1 (A) 02/05/2022   HGBA1C 10.0 (A) 11/14/2021   HGBA1C 9.0 (H) 10/30/2021   HGBA1C 7.1 (A) 04/20/2021   HGBA1C 8.7 (A) 02/10/2021   HGBA1C 6.2 (A) 06/22/2020   Pt is on a regimen of: - Metformin ER 1000 mg with breakfast - Jardiance 25 mg before breakfast - Mounjaro 7.5 >> 10 mg weekly >> off for 3 weeks b/c lack of availability >> restarted 10 mg weekly but could not get it - Tresiba 70 >> 60 >>  70 units daily  - Novolog >> Humalog 8-10 units 15 min before meals - added 01/2023 >> just 1x a week  Pt checks her sugars 2x a day and they are:  - am: 90-105 >> 82-115 >> 76-280s >> 44-120 - 2h after b'fast: n/c - before lunch: n/c >> 83-127 - 2h after lunch: n/c >> 60 - before dinner: 115-150 >> n/c - 2h after dinner: 170-300 >> 150-280 >> 150-200s >> 74-178 - bedtime: n/c - nighttime: n/c >> 219 Lowest sugar was 80-90 >> 82 >> 76 >> 44; ? hypoglycemia awareness. Highest sugar was 300s - potatoes >> 280 (once a week - cheat days) >> 280 >> 219  Glucometer: Contour  [facility], but  not recently, after she changed her insurance.  She is still trying to follow their diet plan.  She was previously drinking regular sodas, but now switched to diet sodas.  She is also reducing carbs.  - no CKD, last BUN/creatinine:  Lab Results  Component Value Date   BUN 13 11/05/2022   BUN 16 05/01/2022   CREATININE 0.91 11/05/2022   CREATININE 0.88 05/01/2022  On losartan 50 mg daily.  -+ HL; last set of lipids: Lab Results  Component Value Date   CHOL 180 05/01/2022   HDL 72.30 05/01/2022   LDLCALC 86 05/01/2022   TRIG 106.0 05/01/2022   CHOLHDL 2 05/01/2022  She is on Lipitor 20 mg daily.  Also, omega-3 fatty acids.  - last eye exam was in 03/2022. No DR.  - + numbness and tingling in her feet.  On Neurontin 300 mg twice a day.  Last foot exam 10/11/2022.  She also has a history of HTN, severe OSA, class II obesity, migraine, GERD, anemia, osteoarthritis, carpal tunnel, gallstones.  ROS: + see HPI  Past Medical History:  Diagnosis Date   Anemia    Anemia    Arthritis    bilateral knees   Breast cancer screening 12/20/2015   Bronchitis    hx of   Carpal tunnel syndrome 12/29/2015  Cervical radiculopathy 11/28/2015   Diabetes mellitus without complication (HCC) 03/04/2016   Diastolic dysfunction 11/04/2019   Essential hypertension 06/13/2016   Fatty liver    Fibroid tumor    Had partial hysterectomy   Gallstones    Gastroesophageal reflux disease without esophagitis 03/11/2018   Heart burn    Joint pain    Left knee pain    bone on bone   Lesion of ulnar nerve 12/29/2015   Migraine    hx of   Obesity (BMI 30-39.9) 11/04/2019   Obesity (BMI 30.0-34.9) 03/31/2019   Primary osteoarthritis of both knees 10/10/2014   Severe sleep apnea 03/27/2021   Sleep apnea    SOBOE (shortness of breath on exertion)    Swelling of both lower extremities    Trigger finger, acquired 10/10/2014   Uncontrolled type 2 diabetes mellitus with hyperglycemia (HCC) 04/15/2018    Visit for preventive health examination 12/20/2015   Past Surgical History:  Procedure Laterality Date   ABDOMINAL HYSTERECTOMY     partial    boil removed      CARPAL TUNNEL RELEASE  04/15/2015   CHOLECYSTECTOMY  09/10/2011   ELBOW SURGERY  04/15/2015   HAND SURGERY  04/15/2015   Nerve damage  04/15/2015   TOTAL KNEE ARTHROPLASTY  08/01/2012   Procedure: TOTAL KNEE ARTHROPLASTY;  Surgeon: Verlee Rossetti, MD;  Location: [facility] OR;  Service: Orthopedics;  Laterality: Right;  RIGHT TOTAL KNEE ARTHROPLASTY   TRIGGER FINGER RELEASE  04/15/2015   TUBAL LIGATION  09/1993   WISDOM TOOTH EXTRACTION     Social History   Socioeconomic History   Marital status: Single    Spouse name: Not on file   Number of children: 2   Years of education: Not on file   Highest education level: Not on file  Occupational History   Occupation: student   Occupation: Works in a group home  Tobacco Use   Smoking status: Former    Packs/day: 0.25    Years: 20.00    Additional pack years: 0.00    Total pack years: 5.00    Types: Cigarettes    Quit date: 12/09/2009    Years since quitting: 13.3   Smokeless tobacco: Never  Vaping Use   Vaping Use: Never used  Substance and Sexual Activity   Alcohol use: Yes    Comment: social   Drug use: No   Sexual activity: Not Currently    Birth control/protection: Surgical  Other Topics Concern   Not on file  Social History Narrative   Not on file   Social Determinants of Health   Financial Resource Strain: Not on file  Food Insecurity: Not on file  Transportation Needs: Not on file  Physical Activity: Not on file  Stress: Not on file  Social Connections: Not on file  Intimate Partner Violence: Not on file   Current Outpatient Medications on File Prior to Visit  Medication Sig Dispense Refill   insulin lispro (HUMALOG KWIKPEN) 100 UNIT/ML KwikPen Inject 8-10 Units into the skin 3 (three) times daily. 30 mL 1   atorvastatin (LIPITOR) 20 MG tablet Take 1  tablet (20 mg total) by mouth at bedtime. 90 tablet 3   azelastine (ASTELIN) 0.1 % nasal spray Place 2 sprays into both nostrils 2 (two) times daily. Use in each nostril as directed 30 mL 11   BLACK COHOSH EXTRACT PO Take 1 tablet by mouth daily.     cholecalciferol (VITAMIN D3) 25 MCG (1000 UNIT) tablet Take  1,000 Units by mouth daily.     Cinnamon 500 MG capsule Take 1,000 mg by mouth 2 (two) times daily.     cyclobenzaprine (FLEXERIL) 10 MG tablet Take 1 tablet (10 mg total) by mouth at bedtime as needed for muscle spasms. 30 tablet 5   Echinacea-Goldenseal (ECHINACEA COMB/GOLDEN SEAL PO) Take 1 capsule by mouth daily as needed (Allergies).     empagliflozin (JARDIANCE) 25 MG TABS tablet Take 1 tablet (25 mg total) by mouth daily. 90 tablet 3   fluocinonide cream (LIDEX) 0.05 % Apply 1 application topically 2 (two) times daily as needed (Itching).     fluticasone (FLONASE) 50 MCG/ACT nasal spray Place 2 sprays into both nostrils daily. 16 g 11   furosemide (LASIX) 20 MG tablet TAKE 1 TABLET (20 MG TOTAL) BY MOUTH DAILY AS NEEDED FOR EDEMA. 90 tablet 2   insulin aspart (NOVOLOG FLEXPEN) 100 UNIT/ML FlexPen Inject 8-10 Units into the skin 3 (three) times daily before meals. 30 mL 1   insulin degludec (TRESIBA FLEXTOUCH) 200 UNIT/ML FlexTouch Pen Inject 60 Units into the skin daily. 27 mL 3   Insulin Pen Needle 32G X 4 MM MISC Use 4x a day 300 each 3   levocetirizine (XYZAL) 5 MG tablet Take 1 tablet (5 mg total) by mouth every evening. 90 tablet 3   losartan (COZAAR) 50 MG tablet Take 1 tablet (50 mg total) by mouth daily. 90 tablet 3   meloxicam (MOBIC) 15 MG tablet Take 1 tablet (15 mg total) by mouth daily. With food 90 tablet 1   metFORMIN (GLUCOPHAGE-XR) 500 MG 24 hr tablet Take 2 tablets (1,000 mg total) by mouth daily with breakfast. 180 tablet 3   nitroGLYCERIN (NITRODUR - DOSED IN MG/24 HR) 0.2 mg/hr patch Place 0.2 mg onto the skin daily. Apply 1/4 patch daily as needed for tondonitis      Omega-3 1000 MG CAPS Take 1 g by mouth daily.     omeprazole (PRILOSEC) 20 MG capsule Take 1 capsule (20 mg total) by mouth daily. 90 capsule 3   tirzepatide (MOUNJARO) 10 MG/0.5ML Pen Inject 10 mg into the skin once a week. 6 mL 3   vitamin B-12 (CYANOCOBALAMIN) 100 MCG tablet Take 100 mcg by mouth daily.     vitamin C (ASCORBIC ACID) 500 MG tablet Take 1,000 mg by mouth daily.     vitamin E 400 UNIT capsule Take 400 Units by mouth daily. Reported on 02/13/2016     No current facility-administered medications on file prior to visit.   Allergies  Allergen Reactions   Penicillins Hives    Hives    Tramadol Itching   Family History  Problem Relation Age of Onset   Diabetes Mother        Living   Hyperlipidemia Mother    Tuberculosis Mother    Asthma Mother    Diabetes Father 37       Deceased   Sudden death Father    Diabetes Sister    Diabetes Brother    Kidney failure Brother 30       Diseased   Healthy Son        x1   Heart disease Maternal Aunt    Heart disease Maternal Uncle    Colon cancer Maternal Uncle        dx in 76's   Breast cancer Cousin 34   Arthritis Other        Maternal Aunts & Uncles   Esophageal cancer  Neg Hx    Rectal cancer Neg Hx    Stomach cancer Neg Hx    PE: BP (!) 130/90   Pulse 85   Ht 5\' 5"  (1.651 m)   Wt 210 lb 3.2 oz (95.3 kg)   SpO2 97%   BMI 34.98 kg/m  Wt Readings from Last 3 Encounters:  03/26/23 210 lb 3.2 oz (95.3 kg)  01/31/23 197 lb 6.4 oz (89.5 kg)  11/05/22 198 lb 6.4 oz (90 kg)   Constitutional: overweight, in NAD Eyes: no exophthalmos ENT: no thyromegaly, no cervical lymphadenopathy Cardiovascular: RRR, No MRG Respiratory: CTA B Musculoskeletal: no deformities Skin: no rashes Neurological: no tremor with outstretched hands  ASSESSMENT: 1. DM2, insulin-dependent, uncontrolled, with complications - CAD - diast.  Dysfunction - PN  2. HL  PLAN:  1. Patient with uncontrolled type 2 diabetes, on oral  antidiabetic regimen with metformin, SGLT2 inhibitor, and basal insulin, with poor control.  At last visit, HbA1c was much higher, increased from 9.6% to 12.2%.  She was drinking sweet drinks and I strongly advised her to stop these.  I advised her that if she did not stop, we would not be able to control her diabetes.  She was also off Mounjaro for 3 weeks and I advised her to restart at 10 mg weekly.  I gave her a written prescription to see if she could obtain it from another pharmacy.  We also added NovoLog 15 minutes before each meal and I advised her how to guide the insulin dose based on the size and consistency of her meals.  We continued the rest of the regimen at that time.  -At today's visit, she was able to improve her diet and take metformin, Jardiance consistently, however, she was not able to obtain [facility].  Also, NovoLog was not covered so she had to switch to Humalog, but due to better control, she did not need to take it more than approximately once a week.  She does continue with the same dose of Tresiba, but she tells me that sometimes the sugars are quite low, and she misses a dose on purpose. -At today's visit, reviewing her blood sugars, they are drastically improved, with occasional low blood sugars in the 40s.  I advised her to reduce the dose of [place] and continue to reduce it until she does not have any more lows, but try not to skip it completely.  Will continue metformin and Jardiance for now.  It is not absolutely mandatory to restart Mounjaro. - I suggested to:  Patient Instructions  Please continue: - Metformin ER 1000 mg with breakfast - Jardiance 25 mg before breakfast  Decrease: - Tresiba 55 units daily (may need even less, maybe 40 units)  Please return for another visit in 3-4 months.   - we checked her HbA1c: 7.8% (significant improvement!) - advised to check sugars at different times of the day - 4x a day, rotating check times - advised for yearly eye exams  >> she is UTD - return to clinic in 3 months   2. HL -Reviewed latest lipid panel from 04/2022: LDL above our goal of less than 55 due to history of cardiovascular disease, otherwise fractions at goal: Lab Results  Component Value Date   CHOL 180 05/01/2022   HDL 72.30 05/01/2022   LDLCALC 86 05/01/2022   TRIG 106.0 05/01/2022   CHOLHDL 2 05/01/2022  -She is on Lipitor 20 mg daily and omega-3 fatty acids-no side effects  Carlus Pavlov,  MD PhD [facility]

## 2023-03-26 NOTE — Patient Instructions (Addendum)
Please continue: - Metformin ER 1000 mg with breakfast - Jardiance 25 mg before breakfast  Decrease: - Tresiba 55 units daily (may need even less, maybe 40 units)  Please return for another visit in 3-4 months.

## 2023-04-01 ENCOUNTER — Ambulatory Visit: Payer: Medicaid Other | Admitting: Internal Medicine

## 2023-04-10 DIAGNOSIS — Z419 Encounter for procedure for purposes other than remedying health state, unspecified: Secondary | ICD-10-CM | POA: Diagnosis not present

## 2023-05-07 ENCOUNTER — Ambulatory Visit (INDEPENDENT_AMBULATORY_CARE_PROVIDER_SITE_OTHER): Payer: Medicaid Other | Admitting: Nurse Practitioner

## 2023-05-07 ENCOUNTER — Encounter: Payer: Self-pay | Admitting: Nurse Practitioner

## 2023-05-07 ENCOUNTER — Other Ambulatory Visit: Payer: Self-pay | Admitting: Nurse Practitioner

## 2023-05-07 VITALS — BP 130/84 | HR 81 | Temp 98.2°F | Resp 16 | Ht 65.0 in | Wt 209.0 lb

## 2023-05-07 DIAGNOSIS — Z794 Long term (current) use of insulin: Secondary | ICD-10-CM | POA: Diagnosis not present

## 2023-05-07 DIAGNOSIS — E1169 Type 2 diabetes mellitus with other specified complication: Secondary | ICD-10-CM

## 2023-05-07 DIAGNOSIS — E1159 Type 2 diabetes mellitus with other circulatory complications: Secondary | ICD-10-CM

## 2023-05-07 DIAGNOSIS — E785 Hyperlipidemia, unspecified: Secondary | ICD-10-CM | POA: Diagnosis not present

## 2023-05-07 DIAGNOSIS — K219 Gastro-esophageal reflux disease without esophagitis: Secondary | ICD-10-CM

## 2023-05-07 DIAGNOSIS — M5412 Radiculopathy, cervical region: Secondary | ICD-10-CM | POA: Diagnosis not present

## 2023-05-07 DIAGNOSIS — I152 Hypertension secondary to endocrine disorders: Secondary | ICD-10-CM

## 2023-05-07 DIAGNOSIS — I10 Essential (primary) hypertension: Secondary | ICD-10-CM

## 2023-05-07 LAB — COMPREHENSIVE METABOLIC PANEL
ALT: 13 U/L (ref 0–35)
AST: 15 U/L (ref 0–37)
Albumin: 4.2 g/dL (ref 3.5–5.2)
Alkaline Phosphatase: 103 U/L (ref 39–117)
BUN: 20 mg/dL (ref 6–23)
CO2: 28 mEq/L (ref 19–32)
Calcium: 9.9 mg/dL (ref 8.4–10.5)
Chloride: 102 mEq/L (ref 96–112)
Creatinine, Ser: 0.88 mg/dL (ref 0.40–1.20)
GFR: 69.86 mL/min (ref >60.00)
Glucose, Bld: 94 mg/dL (ref 70–99)
Potassium: 4.5 mEq/L (ref 3.5–5.1)
Sodium: 139 mEq/L (ref 135–145)
Total Bilirubin: 0.5 mg/dL (ref 0.2–1.2)
Total Protein: 7 g/dL (ref 6.0–8.3)

## 2023-05-07 LAB — MICROALBUMIN / CREATININE URINE RATIO
Creatinine,U: 152.6 mg/dL
Microalb Creat Ratio: 1.1 mg/g (ref 0.0–30.0)
Microalb, Ur: 1.6 mg/dL (ref 0.0–1.9)

## 2023-05-07 LAB — LIPID PANEL
Cholesterol: 166 mg/dL (ref 0–200)
HDL: 77 mg/dL (ref >39.00)
LDL Cholesterol: 77 mg/dL (ref 0–99)
NonHDL: 88.63
Total CHOL/HDL Ratio: 2
Triglycerides: 56 mg/dL (ref 0.0–149.0)
VLDL: 11.2 mg/dL (ref 0.0–40.0)

## 2023-05-07 MED ORDER — ATORVASTATIN CALCIUM 20 MG PO TABS: 20.0000 mg | ORAL_TABLET | Freq: Every day | ORAL | 3 refills | Status: DC

## 2023-05-07 MED ORDER — CYCLOBENZAPRINE HCL 10 MG PO TABS
10.0000 mg | ORAL_TABLET | Freq: Every evening | ORAL | 5 refills | Status: DC | PRN
Start: 2023-05-07 — End: 2023-09-17

## 2023-05-07 MED ORDER — MELOXICAM 15 MG PO TABS: 15.0000 mg | ORAL_TABLET | Freq: Every day | ORAL | 1 refills | Status: DC

## 2023-05-07 MED ORDER — LOSARTAN POTASSIUM 50 MG PO TABS
50.0000 mg | ORAL_TABLET | Freq: Every day | ORAL | 3 refills | Status: DC
Start: 2023-05-07 — End: 2023-06-26

## 2023-05-07 MED ORDER — OMEPRAZOLE 20 MG PO CPDR: 20.0000 mg | DELAYED_RELEASE_CAPSULE | Freq: Every day | ORAL | 3 refills | Status: DC

## 2023-05-07 NOTE — Assessment & Plan Note (Signed)
BP at goal with losartan No LE edema, use of furosemide prn BP Readings from Last 3 Encounters:  05/07/23 130/84  03/26/23 (!) 130/90  01/31/23 128/84    Repeat CMP: normal Maintain med dose

## 2023-05-07 NOTE — Assessment & Plan Note (Signed)
LDL at goal with atorvastatin Maintain med dose 

## 2023-05-07 NOTE — Assessment & Plan Note (Addendum)
Controlled with omeprazole °Refill sent °

## 2023-05-07 NOTE — Progress Notes (Signed)
Established Patient Visit  Patient: [name]   DOB: 1961/01/07   62 y.o. Female  MRN: [id number] Visit Date: 05/07/2023  Subjective:    Chief Complaint  Patient presents with   Medical Management of Chronic Issues   HPI DM (diabetes mellitus) (HCC) Under the care of Dr. Wyonia Hough (endocrinology) Improved and controlled with Current use of jardiance, tresiba, and metformin BP at goal Repeat lipid panel: LDL at goal with atorvastatin Repeat UACr: normal Advised to schedule appt with ophthalmology  Essential hypertension BP at goal with losartan No LE edema, use of furosemide prn BP Readings from Last 3 Encounters:  05/07/23 130/84  03/26/23 (!) 130/90  01/31/23 128/84    Repeat CMP: normal Maintain med dose  Gastroesophageal reflux disease without esophagitis Controlled with omeprazole Refill sent.  Hyperlipidemia associated with type 2 diabetes mellitus (HCC) LDL at goal with atorvastatin Maintain med dose  Reviewed medical, surgical, and social history today  Medications: Outpatient Medications Prior to Visit  Medication Sig   azelastine (ASTELIN) 0.1 % nasal spray Place 2 sprays into both nostrils 2 (two) times daily. Use in each nostril as directed   BLACK COHOSH EXTRACT PO Take 1 tablet by mouth daily.   cholecalciferol (VITAMIN D3) 25 MCG (1000 UNIT) tablet Take 1,000 Units by mouth daily.   Cinnamon 500 MG capsule Take 1,000 mg by mouth 2 (two) times daily.   Echinacea-Goldenseal (ECHINACEA COMB/GOLDEN SEAL PO) Take 1 capsule by mouth daily as needed (Allergies).   empagliflozin (JARDIANCE) 25 MG TABS tablet Take 1 tablet (25 mg total) by mouth daily.   fluocinonide cream (LIDEX) 0.05 % Apply 1 application topically 2 (two) times daily as needed (Itching).   fluticasone (FLONASE) 50 MCG/ACT nasal spray Place 2 sprays into both nostrils daily.   furosemide (LASIX) 20 MG tablet TAKE 1 TABLET (20 MG TOTAL) BY MOUTH DAILY AS NEEDED FOR EDEMA.    glucose blood (CONTOUR NEXT TEST) test strip Use as instructed to check 2X daily   insulin degludec (TRESIBA FLEXTOUCH) 200 UNIT/ML FlexTouch Pen Inject 60 Units into the skin daily.   Insulin Pen Needle 32G X 4 MM MISC Use 4x a day   levocetirizine (XYZAL) 5 MG tablet Take 1 tablet (5 mg total) by mouth every evening.   metFORMIN (GLUCOPHAGE-XR) 500 MG 24 hr tablet Take 2 tablets (1,000 mg total) by mouth daily with breakfast.   nitroGLYCERIN (NITRODUR - DOSED IN MG/24 HR) 0.2 mg/hr patch Place 0.2 mg onto the skin daily. Apply 1/4 patch daily as needed for tondonitis   Omega-3 1000 MG CAPS Take 1 g by mouth daily.   vitamin B-12 (CYANOCOBALAMIN) 100 MCG tablet Take 100 mcg by mouth daily.   vitamin C (ASCORBIC ACID) 500 MG tablet Take 1,000 mg by mouth daily.   vitamin E 400 UNIT capsule Take 400 Units by mouth daily. Reported on 02/13/2016   [DISCONTINUED] atorvastatin (LIPITOR) 20 MG tablet Take 1 tablet (20 mg total) by mouth at bedtime.   [DISCONTINUED] cyclobenzaprine (FLEXERIL) 10 MG tablet Take 1 tablet (10 mg total) by mouth at bedtime as needed for muscle spasms.   [DISCONTINUED] losartan (COZAAR) 50 MG tablet Take 1 tablet (50 mg total) by mouth daily.   [DISCONTINUED] meloxicam (MOBIC) 15 MG tablet Take 1 tablet (15 mg total) by mouth daily. With food   [DISCONTINUED] omeprazole (PRILOSEC) 20 MG capsule Take 1 capsule (20 mg total) by  mouth daily.   No facility-administered medications prior to visit.   Reviewed past medical and social history.   ROS per HPI above      Objective:  BP 130/84 (BP Location: Left Arm, Patient Position: Sitting, Cuff Size: Large)   Pulse 81   Temp 98.2 F (36.8 C) (Temporal)   Resp 16   Ht 5\' 5"  (1.651 m)   Wt 209 lb (94.8 kg)   SpO2 98%   BMI 34.78 kg/m      Physical Exam Cardiovascular:     Rate and Rhythm: Normal rate and regular rhythm.     Pulses: Normal pulses.     Heart sounds: Normal heart sounds.  Pulmonary:     Effort:  Pulmonary effort is normal.     Breath sounds: Normal breath sounds.  Neurological:     Mental Status: She is alert and oriented to person, place, and time.     Results for orders placed or performed in visit on 05/07/23  Microalbumin / creatinine urine ratio  Result Value Ref Range   Microalb, Ur 1.6 0.0 - 1.9 mg/dL   Creatinine,U 696.2 mg/dL   Microalb Creat Ratio 1.1 0.0 - 30.0 mg/g  Lipid panel  Result Value Ref Range   Cholesterol 166 0 - 200 mg/dL   Triglycerides 95.2 0.0 - 149.0 mg/dL   HDL 84.13 >24.40 mg/dL   VLDL 10.2 0.0 - 72.5 mg/dL   LDL Cholesterol 77 0 - 99 mg/dL   Total CHOL/HDL Ratio 2    NonHDL 88.63   Comprehensive metabolic panel  Result Value Ref Range   Sodium 139 135 - 145 mEq/L   Potassium 4.5 3.5 - 5.1 mEq/L   Chloride 102 96 - 112 mEq/L   CO2 28 19 - 32 mEq/L   Glucose, Bld 94 70 - 99 mg/dL   BUN 20 6 - 23 mg/dL   Creatinine, Ser 3.66 0.40 - 1.20 mg/dL   Total Bilirubin 0.5 0.2 - 1.2 mg/dL   Alkaline Phosphatase 103 39 - 117 U/L   AST 15 0 - 37 U/L   ALT 13 0 - 35 U/L   Total Protein 7.0 6.0 - 8.3 g/dL   Albumin 4.2 3.5 - 5.2 g/dL   GFR 44.03 >47.42 mL/min   Calcium 9.9 8.4 - 10.5 mg/dL      Assessment & Plan:    Problem List Items Addressed This Visit       Cardiovascular and Mediastinum   Essential hypertension    BP at goal with losartan No LE edema, use of furosemide prn BP Readings from Last 3 Encounters:  05/07/23 130/84  03/26/23 (!) 130/90  01/31/23 128/84    Repeat CMP: normal Maintain med dose      Relevant Medications   atorvastatin (LIPITOR) 20 MG tablet   losartan (COZAAR) 50 MG tablet   Other Relevant Orders   Comprehensive metabolic panel (Completed)     Digestive   Gastroesophageal reflux disease without esophagitis    Controlled with omeprazole Refill sent.      Relevant Medications   omeprazole (PRILOSEC) 20 MG capsule     Endocrine   DM (diabetes mellitus) (HCC)    Under the care of Dr. Wyonia Hough  (endocrinology) Improved and controlled with Current use of jardiance, tresiba, and metformin BP at goal Repeat lipid panel: LDL at goal with atorvastatin Repeat UACr: normal Advised to schedule appt with ophthalmology      Relevant Medications   atorvastatin (LIPITOR) 20 MG tablet  losartan (COZAAR) 50 MG tablet   Other Relevant Orders   Microalbumin / creatinine urine ratio (Completed)   Lipid panel (Completed)   Comprehensive metabolic panel (Completed)   Hyperlipidemia associated with type 2 diabetes mellitus (HCC)    LDL at goal with atorvastatin Maintain med dose      Relevant Medications   atorvastatin (LIPITOR) 20 MG tablet   losartan (COZAAR) 50 MG tablet   Other Relevant Orders   Lipid panel (Completed)   Comprehensive metabolic panel (Completed)     Nervous and Auditory   Cervical radiculopathy   Relevant Medications   cyclobenzaprine (FLEXERIL) 10 MG tablet   meloxicam (MOBIC) 15 MG tablet   Other Visit Diagnoses     Hypertension associated with type 2 diabetes mellitus (HCC)    -  Primary   Relevant Medications   atorvastatin (LIPITOR) 20 MG tablet   losartan (COZAAR) 50 MG tablet   Other Relevant Orders   Comprehensive metabolic panel (Completed)      Return in about 6 months (around 11/07/2023) for HTN, hyperlipidemia (fasting).     Alysia Penna, NP

## 2023-05-07 NOTE — Assessment & Plan Note (Addendum)
Under the care of Dr. Wyonia Hough (endocrinology) Improved and controlled with Current use of jardiance, tresiba, and metformin BP at goal Repeat lipid panel: LDL at goal with atorvastatin Repeat UACr: normal Advised to schedule appt with ophthalmology

## 2023-05-07 NOTE — Progress Notes (Signed)
Stable Follow instructions as discussed during office visit.

## 2023-05-07 NOTE — Patient Instructions (Signed)
Go to lab Continue Heart healthy diet and daily exercise. Maintain current medications. 

## 2023-05-11 DIAGNOSIS — Z419 Encounter for procedure for purposes other than remedying health state, unspecified: Secondary | ICD-10-CM | POA: Diagnosis not present

## 2023-06-03 ENCOUNTER — Other Ambulatory Visit (HOSPITAL_COMMUNITY): Payer: Self-pay

## 2023-06-03 ENCOUNTER — Telehealth: Payer: Self-pay

## 2023-06-03 NOTE — Telephone Encounter (Signed)
Patient Advocate Encounter   Received notification from [facility] that prior authorization is required for [facility] 10MG /0.5ML pen-injectors  Submitted: 06/03/23 Key [id number]  Status is pending

## 2023-06-04 NOTE — Telephone Encounter (Signed)
Pharmacy Patient Advocate Encounter  Prior Authorization has been APPROVED  Effective through 06/02/2024

## 2023-06-10 DIAGNOSIS — Z419 Encounter for procedure for purposes other than remedying health state, unspecified: Secondary | ICD-10-CM | POA: Diagnosis not present

## 2023-06-11 ENCOUNTER — Other Ambulatory Visit: Payer: Self-pay | Admitting: Internal Medicine

## 2023-06-11 DIAGNOSIS — E1165 Type 2 diabetes mellitus with hyperglycemia: Secondary | ICD-10-CM

## 2023-06-12 ENCOUNTER — Telehealth: Payer: Self-pay

## 2023-06-12 DIAGNOSIS — E1165 Type 2 diabetes mellitus with hyperglycemia: Secondary | ICD-10-CM

## 2023-06-12 NOTE — Telephone Encounter (Signed)
Irie Dowson Ann Costas Sena, CMA  ?

## 2023-06-26 ENCOUNTER — Ambulatory Visit: Payer: Medicaid Other | Attending: Cardiology | Admitting: Cardiology

## 2023-06-26 ENCOUNTER — Encounter: Payer: Self-pay | Admitting: Cardiology

## 2023-06-26 VITALS — BP 130/82 | HR 102 | Ht 66.0 in | Wt 196.4 lb

## 2023-06-26 DIAGNOSIS — E785 Hyperlipidemia, unspecified: Secondary | ICD-10-CM

## 2023-06-26 DIAGNOSIS — I10 Essential (primary) hypertension: Secondary | ICD-10-CM | POA: Diagnosis not present

## 2023-06-26 DIAGNOSIS — Z7984 Long term (current) use of oral hypoglycemic drugs: Secondary | ICD-10-CM

## 2023-06-26 DIAGNOSIS — I517 Cardiomegaly: Secondary | ICD-10-CM

## 2023-06-26 DIAGNOSIS — R609 Edema, unspecified: Secondary | ICD-10-CM

## 2023-06-26 DIAGNOSIS — E669 Obesity, unspecified: Secondary | ICD-10-CM | POA: Diagnosis not present

## 2023-06-26 DIAGNOSIS — E1169 Type 2 diabetes mellitus with other specified complication: Secondary | ICD-10-CM | POA: Diagnosis not present

## 2023-06-26 DIAGNOSIS — I2510 Atherosclerotic heart disease of native coronary artery without angina pectoris: Secondary | ICD-10-CM | POA: Diagnosis not present

## 2023-06-26 DIAGNOSIS — G4733 Obstructive sleep apnea (adult) (pediatric): Secondary | ICD-10-CM

## 2023-06-26 MED ORDER — ATORVASTATIN CALCIUM 20 MG PO TABS
20.0000 mg | ORAL_TABLET | Freq: Every day | ORAL | 3 refills | Status: DC
Start: 2023-06-26 — End: 2024-04-20

## 2023-06-26 MED ORDER — LOSARTAN POTASSIUM 50 MG PO TABS
50.0000 mg | ORAL_TABLET | Freq: Every day | ORAL | 3 refills | Status: AC
Start: 2023-06-26 — End: ?

## 2023-06-26 MED ORDER — FUROSEMIDE 20 MG PO TABS: 20.0000 mg | ORAL_TABLET | Freq: Every day | ORAL | 2 refills | Status: AC | PRN

## 2023-06-26 NOTE — Progress Notes (Signed)
Cardiology Office Note:    Date:  06/26/2023   ID:  [name], DOB 22-Jun-1961, MRN [id number]  PCP:  Anne Ng, NP  Cardiologist:  Thomasene Ripple, DO  Electrophysiologist:  None   Referring MD: Anne Ng, NP   " I am ok    History of Present Illness:    [name] is a 62 y.o. female with a hx of minimal coronary artery disease, severe obstructive sleep apnea, hypertension, hyperlipidemia obesity.  Here today for follow-up visit.  Her last visit with me was on a 03/27/2021 at that time she was still pending her CPAP.  She had no angina symptoms.  Blood pressure was below target and we continue her statin for hyperlipidemia.  She offers no complaints at this time.    Past Medical History:  Diagnosis Date   Anemia    Anemia    Arthritis    bilateral knees   Breast cancer screening 12/20/2015   Bronchitis    hx of   Carpal tunnel syndrome 12/29/2015   Cervical radiculopathy 11/28/2015   Diabetes mellitus without complication (HCC) 03/04/2016   Diastolic dysfunction 11/04/2019   Essential hypertension 06/13/2016   Fatty liver    Fibroid tumor    Had partial hysterectomy   Gallstones    Gastroesophageal reflux disease without esophagitis 03/11/2018   Heart burn    Joint pain    Left knee pain    bone on bone   Lesion of ulnar nerve 12/29/2015   Migraine    hx of   Obesity (BMI 30-39.9) 11/04/2019   Obesity (BMI 30.0-34.9) 03/31/2019   Primary osteoarthritis of both knees 10/10/2014   Severe sleep apnea 03/27/2021   Sleep apnea    SOBOE (shortness of breath on exertion)    Swelling of both lower extremities    Trigger finger, acquired 10/10/2014   Uncontrolled type 2 diabetes mellitus with hyperglycemia (HCC) 04/15/2018   Visit for preventive health examination 12/20/2015    Past Surgical History:  Procedure Laterality Date   ABDOMINAL HYSTERECTOMY     partial    boil removed      CARPAL TUNNEL RELEASE  04/15/2015    CHOLECYSTECTOMY  09/10/2011   ELBOW SURGERY  04/15/2015   HAND SURGERY  04/15/2015   Nerve damage  04/15/2015   TOTAL KNEE ARTHROPLASTY  08/01/2012   Procedure: TOTAL KNEE ARTHROPLASTY;  Surgeon: Verlee Rossetti, MD;  Location: [facility] OR;  Service: Orthopedics;  Laterality: Right;  RIGHT TOTAL KNEE ARTHROPLASTY   TRIGGER FINGER RELEASE  04/15/2015   TUBAL LIGATION  09/1993   WISDOM TOOTH EXTRACTION      Current Medications: No outpatient medications have been marked as taking for the 06/26/23 encounter (Office Visit) with Thomasene Ripple, DO.     Allergies:   Penicillins and Tramadol   Social History   Socioeconomic History   Marital status: Single    Spouse name: Not on file   Number of children: 2   Years of education: Not on file   Highest education level: Not on file  Occupational History   Occupation: student   Occupation: Works in a group home  Tobacco Use   Smoking status: Former    Current packs/day: 0.00    Average packs/day: 0.3 packs/day for 20.0 years (5.0 ttl pk-yrs)    Types: Cigarettes    Start date: 12/09/1989    Quit date: 12/09/2009    Years since quitting: 13.5   Smokeless tobacco: Never  Vaping Use   Vaping status: Never Used  Substance and Sexual Activity   Alcohol use: Yes    Comment: social   Drug use: No   Sexual activity: Not Currently    Birth control/protection: Surgical  Other Topics Concern   Not on file  Social History Narrative   Not on file   Social Determinants of Health   Financial Resource Strain: Not on file  Food Insecurity: Not on file  Transportation Needs: Not on file  Physical Activity: Not on file  Stress: Not on file  Social Connections: Not on file     Family History: The patient's family history includes Arthritis in an other family member; Asthma in her mother; Breast cancer (age of onset: 25) in her cousin; Colon cancer in her maternal uncle; Diabetes in her brother, mother, and sister; Diabetes (age of onset: 77) in  her father; Healthy in her son; Heart disease in her maternal aunt and maternal uncle; Hyperlipidemia in her mother; Kidney failure (age of onset: 49) in her brother; Sudden death in her father; Tuberculosis in her mother. There is no history of Esophageal cancer, Rectal cancer, or Stomach cancer.  ROS:   Review of Systems  Constitution: Negative for decreased appetite, fever and weight gain.  HENT: Negative for congestion, ear discharge, hoarse voice and sore throat.   Eyes: Negative for discharge, redness, vision loss in right eye and visual halos.  Cardiovascular: Negative for chest pain, dyspnea on exertion, leg swelling, orthopnea and palpitations.  Respiratory: Negative for cough, hemoptysis, shortness of breath and snoring.   Endocrine: Negative for heat intolerance and polyphagia.  Hematologic/Lymphatic: Negative for bleeding problem. Does not bruise/bleed easily.  Skin: Negative for flushing, nail changes, rash and suspicious lesions.  Musculoskeletal: Negative for arthritis, joint pain, muscle cramps, myalgias, neck pain and stiffness.  Gastrointestinal: Negative for abdominal pain, bowel incontinence, diarrhea and excessive appetite.  Genitourinary: Negative for decreased libido, genital sores and incomplete emptying.  Neurological: Negative for brief paralysis, focal weakness, headaches and loss of balance.  Psychiatric/Behavioral: Negative for altered mental status, depression and suicidal ideas.  Allergic/Immunologic: Negative for HIV exposure and persistent infections.    EKGs/Labs/Other Studies Reviewed:    The following studies were reviewed today:   EKG:  The ekg ordered today demonstrates sinus tachycardia HR 102 bpm  Recent Labs: 05/07/2023: ALT 13; BUN 20; Creatinine, Ser 0.88; Potassium 4.5; Sodium 139  Recent Lipid Panel    Component Value Date/Time   CHOL 166 05/07/2023 0832   CHOL 175 11/04/2019 1128   TRIG 56.0 05/07/2023 0832   HDL 77.00 05/07/2023 0832    HDL 73 11/04/2019 1128   CHOLHDL 2 05/07/2023 0832   VLDL 11.2 05/07/2023 0832   LDLCALC 77 05/07/2023 0832   LDLCALC 87 11/04/2019 1128    Physical Exam:    VS:  There were no vitals taken for this visit.    Wt Readings from Last 3 Encounters:  05/07/23 209 lb (94.8 kg)  03/26/23 210 lb 3.2 oz (95.3 kg)  01/31/23 197 lb 6.4 oz (89.5 kg)     GEN: Well nourished, well developed in no acute distress HEENT: Normal NECK: No JVD; No carotid bruits LYMPHATICS: No lymphadenopathy CARDIAC: S1S2 noted,RRR, no murmurs, rubs, gallops RESPIRATORY:  Clear to auscultation without rales, wheezing or rhonchi  ABDOMEN: Soft, non-tender, non-distended, +bowel sounds, no guarding. EXTREMITIES: No edema, No cyanosis, no clubbing MUSCULOSKELETAL:  No deformity  SKIN: Warm and dry NEUROLOGIC:  Alert and oriented x 3,  non-focal PSYCHIATRIC:  Normal affect, good insight  ASSESSMENT:    1. Left ventricular hypertrophy   2. CAD in native artery   3. Hyperlipidemia with target LDL less than 70   4. Essential hypertension   5. Obesity (BMI 30.0-34.9)   6. OSA (obstructive sleep apnea)    PLAN:    She is doing well from a CV standpoint.  No medication changes. Will send refills.   The patient is in agreement with the above plan. The patient left the office in stable condition.  The patient will follow up in 1 year or sooner if needed.   Medication Adjustments/Labs and Tests Ordered: Current medicines are reviewed at length with the patient today.  Concerns regarding medicines are outlined above.  Orders Placed This Encounter  Procedures   EKG 12-Lead   No orders of the defined types were placed in this encounter.   There are no Patient Instructions on file for this visit.   Adopting a Healthy Lifestyle.  Know what a healthy weight is for you (roughly BMI <25) and aim to maintain this   Aim for 7+ servings of fruits and vegetables daily   65-80+ fluid ounces of water or unsweet tea  for healthy kidneys   Limit to max 1 drink of alcohol per day; avoid smoking/tobacco   Limit animal fats in diet for cholesterol and heart health - choose grass fed whenever available   Avoid highly processed foods, and foods high in saturated/trans fats   Aim for low stress - take time to unwind and care for your mental health   Aim for 150 min of moderate intensity exercise weekly for heart health, and weights twice weekly for bone health   Aim for 7-9 hours of sleep daily   When it comes to diets, agreement about the perfect plan isnt easy to find, even among the experts. Experts at the [facility] [employer] developed an idea known as the Healthy Eating Plate. Just imagine a plate divided into logical, healthy portions.   The emphasis is on diet quality:   Load up on vegetables and fruits - one-half of your plate: Aim for color and variety, and remember that potatoes dont count.   Go for whole grains - one-quarter of your plate: Whole wheat, barley, wheat berries, quinoa, oats, brown rice, and foods made with them. If you want pasta, go with whole wheat pasta.   Protein power - one-quarter of your plate: Fish, chicken, beans, and nuts are all healthy, versatile protein sources. Limit red meat.   The diet, however, does go beyond the plate, offering a few other suggestions.   Use healthy plant oils, such as olive, canola, soy, corn, sunflower and peanut. Check the labels, and avoid partially hydrogenated oil, which have unhealthy trans fats.   If youre thirsty, drink water. Coffee and tea are good in moderation, but skip sugary drinks and limit milk and dairy products to one or two daily servings.   The type of carbohydrate in the diet is more important than the amount. Some sources of carbohydrates, such as vegetables, fruits, whole grains, and beans-are healthier than others.   Finally, stay active  Signed, Thomasene Ripple, DO  06/26/2023 3:59 PM    [facility]

## 2023-06-26 NOTE — Patient Instructions (Signed)
Medication Instructions:  Your physician recommends that you continue on your current medications as directed. Please refer to the Current Medication list given to you today.  *If you need a refill on your cardiac medications before your next appointment, please call your pharmacy*   Lab Work: None needed  If you have labs (blood work) drawn today and your tests are completely normal, you will receive your results only by: MyChart Message (if you have MyChart) OR A paper copy in the mail If you have any lab test that is abnormal or we need to change your treatment, we will call you to review the results.   Testing/Procedures: None needed   Follow-Up: At [facility], you and your health needs are our priority.  As part of our continuing mission to provide you with exceptional heart care, we have created designated Provider Care Teams.  These Care Teams include your primary Cardiologist (physician) and Advanced Practice Providers (APPs -  Physician Assistants and Nurse Practitioners) who all work together to provide you with the care you need, when you need it.  We recommend signing up for the patient portal called "MyChart".  Sign up information is provided on this After Visit Summary.  MyChart is used to connect with patients for Virtual Visits (Telemedicine).  Patients are able to view lab/test results, encounter notes, upcoming appointments, etc.  Non-urgent messages can be sent to your provider as well.   To learn more about what you can do with MyChart, go to [email or web address].    Your next appointment:   1 year(s)  Provider:   Thomasene Ripple, DO

## 2023-07-02 ENCOUNTER — Other Ambulatory Visit: Payer: Self-pay | Admitting: Internal Medicine

## 2023-07-05 ENCOUNTER — Other Ambulatory Visit: Payer: Self-pay | Admitting: Nurse Practitioner

## 2023-07-05 DIAGNOSIS — R609 Edema, unspecified: Secondary | ICD-10-CM

## 2023-07-11 DIAGNOSIS — Z419 Encounter for procedure for purposes other than remedying health state, unspecified: Secondary | ICD-10-CM | POA: Diagnosis not present

## 2023-08-11 DIAGNOSIS — Z419 Encounter for procedure for purposes other than remedying health state, unspecified: Secondary | ICD-10-CM | POA: Diagnosis not present

## 2023-08-15 ENCOUNTER — Encounter: Payer: Self-pay | Admitting: Internal Medicine

## 2023-08-15 ENCOUNTER — Ambulatory Visit (INDEPENDENT_AMBULATORY_CARE_PROVIDER_SITE_OTHER): Payer: Medicaid Other | Admitting: Internal Medicine

## 2023-08-15 VITALS — BP 122/80 | HR 87 | Ht 66.0 in | Wt 192.8 lb

## 2023-08-15 DIAGNOSIS — E785 Hyperlipidemia, unspecified: Secondary | ICD-10-CM | POA: Diagnosis not present

## 2023-08-15 DIAGNOSIS — E1159 Type 2 diabetes mellitus with other circulatory complications: Secondary | ICD-10-CM

## 2023-08-15 DIAGNOSIS — E1165 Type 2 diabetes mellitus with hyperglycemia: Secondary | ICD-10-CM

## 2023-08-15 DIAGNOSIS — Z794 Long term (current) use of insulin: Secondary | ICD-10-CM | POA: Diagnosis not present

## 2023-08-15 DIAGNOSIS — Z7984 Long term (current) use of oral hypoglycemic drugs: Secondary | ICD-10-CM

## 2023-08-15 DIAGNOSIS — Z7985 Long-term (current) use of injectable non-insulin antidiabetic drugs: Secondary | ICD-10-CM

## 2023-08-15 DIAGNOSIS — E1169 Type 2 diabetes mellitus with other specified complication: Secondary | ICD-10-CM | POA: Diagnosis not present

## 2023-08-15 LAB — POCT GLYCOSYLATED HEMOGLOBIN (HGB A1C): Hemoglobin A1C: 8.1 % — AB (ref 4.0–5.6)

## 2023-08-15 MED ORDER — EMPAGLIFLOZIN 25 MG PO TABS: 25.0000 mg | ORAL_TABLET | Freq: Every day | ORAL | 3 refills | Status: DC

## 2023-08-15 MED ORDER — OZEMPIC (1 MG/DOSE) 4 MG/3ML ~~LOC~~ SOPN: 1.0000 mg | PEN_INJECTOR | SUBCUTANEOUS | 3 refills | Status: DC

## 2023-08-15 MED ORDER — FREESTYLE LIBRE 3 SENSOR MISC: 1.0000 | 3 refills | Status: DC

## 2023-08-15 MED ORDER — METFORMIN HCL ER 500 MG PO TB24: 1000.0000 mg | ORAL_TABLET | Freq: Every day | ORAL | 3 refills | Status: DC

## 2023-08-15 MED ORDER — TRESIBA FLEXTOUCH 200 UNIT/ML ~~LOC~~ SOPN
60.0000 [IU] | PEN_INJECTOR | Freq: Every day | SUBCUTANEOUS | 3 refills | Status: DC
Start: 2023-08-15 — End: 2023-12-13

## 2023-08-15 NOTE — Progress Notes (Signed)
Patient ID: [name], female   DOB: 17-Feb-1961, 62 y.o.   MRN: [id number]  HPI: [name] is a 62 y.o.-year-old female, returning for follow-up for DM2, dx in 2017 (glucose 400s while on steroids), insulin-dependent, uncontrolled, with complications (CAD, diastolic dysfunction, PN). Pt. previously saw Dr. Everardo All, but last visit with me 4.5 months ago.  Interim history: No increased urination,  blurry vision, nausea, chest pain. She lost 17 pounds since last visit.  Reviewed HbA1c: Lab Results  Component Value Date   HGBA1C 7.8 (A) 03/26/2023   HGBA1C 12.2 (A) 01/31/2023   HGBA1C 9.6 (A) 10/11/2022   HGBA1C 9.3 (A) 06/29/2022   HGBA1C 9.9 (A) 04/02/2022   HGBA1C 10.1 (A) 02/05/2022   HGBA1C 10.0 (A) 11/14/2021   HGBA1C 9.0 (H) 10/30/2021   HGBA1C 7.1 (A) 04/20/2021   HGBA1C 8.7 (A) 02/10/2021   Pt is on a regimen of: - Metformin ER 1000 mg with breakfast - Jardiance 25 mg before breakfast - Mounjaro 7.5 >> 10 mg weekly >> off b/c lack of availability >> restarted 10 mg weekly - Tresiba 70 >> 60 >>  70 >> 55 units daily  - misses doses. - Novolog >> Humalog 8-10 units 15 min before meals - added 01/2023 >> just 1x a week >> off  Pt checks her sugars 2x a day and they are:  - am: 90-105 >> 82-115 >> 76-280s >> 44-120 >> 55-145, 200s - 2h after b'fast: n/c - before lunch: n/c >> 83-127 >> 100 - 2h after lunch: n/c >> 60 >> n/c - before dinner: 115-150 >> n/c - 2h after dinner: 170-300 >> 150-280 >> 150-200s >> 74-178 >> 90s-120s - bedtime: n/c - nighttime: n/c >> 219 Lowest sugar was 76 >> 44 >> 55; ? hypoglycemia awareness. Highest sugar was 280 >> 219 >> 200s.  Glucometer: Contour  [facility] but not [facility], after she changed her insurance.  She is still trying to follow their diet plan.  She was previously drinking regular sodas, but now switched to diet sodas.  She is also reducing carbs.  - no CKD, last BUN/creatinine:   Lab Results  Component Value Date   BUN 20 05/07/2023   BUN 13 11/05/2022   CREATININE 0.88 05/07/2023   CREATININE 0.91 11/05/2022   Lab Results  Component Value Date   MICRALBCREAT 1.1 05/07/2023   MICRALBCREAT 1.3 05/01/2022   MICRALBCREAT 0.8 03/09/2021   MICRALBCREAT 9 04/08/2020   MICRALBCREAT 0.7 03/28/2016  On losartan 50 mg daily.  -+ HL; last set of lipids: Lab Results  Component Value Date   CHOL 166 05/07/2023   HDL 77.00 05/07/2023   LDLCALC 77 05/07/2023   TRIG 56.0 05/07/2023   CHOLHDL 2 05/07/2023  She is on Lipitor 20 mg daily.  Also, omega-3 fatty acids.  - last eye exam was in 03/2022. No DR.  - + numbness and tingling in her feet.  On Neurontin 300 mg twice a day.  Last foot exam 10/11/2022.  She also has a history of HTN, severe OSA, class II obesity, migraine, GERD, anemia, osteoarthritis, carpal tunnel, gallstones.  ROS: + see HPI  Past Medical History:  Diagnosis Date   Anemia    Anemia    Arthritis    bilateral knees   Breast cancer screening 12/20/2015   Bronchitis    hx of   Carpal tunnel syndrome 12/29/2015   Cervical radiculopathy 11/28/2015   Diabetes mellitus without complication (HCC)  03/04/2016   Diastolic dysfunction 11/04/2019   Essential hypertension 06/13/2016   Fatty liver    Fibroid tumor    Had partial hysterectomy   Gallstones    Gastroesophageal reflux disease without esophagitis 03/11/2018   Heart burn    Joint pain    Left knee pain    bone on bone   Lesion of ulnar nerve 12/29/2015   Migraine    hx of   Obesity (BMI 30-39.9) 11/04/2019   Obesity (BMI 30.0-34.9) 03/31/2019   Primary osteoarthritis of both knees 10/10/2014   Severe sleep apnea 03/27/2021   Sleep apnea    SOBOE (shortness of breath on exertion)    Swelling of both lower extremities    Trigger finger, acquired 10/10/2014   Uncontrolled type 2 diabetes mellitus with hyperglycemia (HCC) 04/15/2018   Visit for preventive health examination  12/20/2015   Past Surgical History:  Procedure Laterality Date   ABDOMINAL HYSTERECTOMY     partial    boil removed      CARPAL TUNNEL RELEASE  04/15/2015   CHOLECYSTECTOMY  09/10/2011   ELBOW SURGERY  04/15/2015   HAND SURGERY  04/15/2015   Nerve damage  04/15/2015   TOTAL KNEE ARTHROPLASTY  08/01/2012   Procedure: TOTAL KNEE ARTHROPLASTY;  Surgeon: Verlee Rossetti, MD;  Location: [facility] OR;  Service: Orthopedics;  Laterality: Right;  RIGHT TOTAL KNEE ARTHROPLASTY   TRIGGER FINGER RELEASE  04/15/2015   TUBAL LIGATION  09/1993   WISDOM TOOTH EXTRACTION     Social History   Socioeconomic History   Marital status: Single    Spouse name: Not on file   Number of children: 2   Years of education: Not on file   Highest education level: Not on file  Occupational History   Occupation: student   Occupation: Works in a group home  Tobacco Use   Smoking status: Former    Current packs/day: 0.00    Average packs/day: 0.3 packs/day for 20.0 years (5.0 ttl pk-yrs)    Types: Cigarettes    Start date: 12/09/1989    Quit date: 12/09/2009    Years since quitting: 13.6   Smokeless tobacco: Never  Vaping Use   Vaping status: Never Used  Substance and Sexual Activity   Alcohol use: Yes    Comment: social   Drug use: No   Sexual activity: Not Currently    Birth control/protection: Surgical  Other Topics Concern   Not on file  Social History Narrative   Not on file   Social Determinants of Health   Financial Resource Strain: Not on file  Food Insecurity: Not on file  Transportation Needs: Not on file  Physical Activity: Not on file  Stress: Not on file  Social Connections: Not on file  Intimate Partner Violence: Not on file   Current Outpatient Medications on File Prior to Visit  Medication Sig Dispense Refill   atorvastatin (LIPITOR) 20 MG tablet Take 1 tablet (20 mg total) by mouth at bedtime. 90 tablet 3   azelastine (ASTELIN) 0.1 % nasal spray Place 2 sprays into both  nostrils 2 (two) times daily. Use in each nostril as directed 30 mL 11   BLACK COHOSH EXTRACT PO Take 1 tablet by mouth daily.     cholecalciferol (VITAMIN D3) 25 MCG (1000 UNIT) tablet Take 1,000 Units by mouth daily.     Cinnamon 500 MG capsule Take 1,000 mg by mouth 2 (two) times daily.     cyclobenzaprine (FLEXERIL) 10 MG tablet  Take 1 tablet (10 mg total) by mouth at bedtime as needed for muscle spasms. 30 tablet 5   Echinacea-Goldenseal (ECHINACEA COMB/GOLDEN SEAL PO) Take 1 capsule by mouth daily as needed (Allergies).     empagliflozin (JARDIANCE) 25 MG TABS tablet Take 1 tablet (25 mg total) by mouth daily. 90 tablet 3   fluocinonide cream (LIDEX) 0.05 % Apply 1 application topically 2 (two) times daily as needed (Itching).     fluticasone (FLONASE) 50 MCG/ACT nasal spray Place 2 sprays into both nostrils daily. 16 g 11   furosemide (LASIX) 20 MG tablet Take 1 tablet (20 mg total) by mouth daily as needed for edema. 90 tablet 2   glucose blood (CONTOUR NEXT TEST) test strip Use as instructed to check 2X daily 300 each 2   insulin degludec (TRESIBA FLEXTOUCH) 200 UNIT/ML FlexTouch Pen Inject 60 Units into the skin daily. 27 mL 3   Insulin Pen Needle 32G X 4 MM MISC Use 4x a day 300 each 3   levocetirizine (XYZAL) 5 MG tablet Take 1 tablet (5 mg total) by mouth every evening. 90 tablet 3   losartan (COZAAR) 50 MG tablet Take 1 tablet (50 mg total) by mouth daily. 90 tablet 3   meloxicam (MOBIC) 15 MG tablet Take 1 tablet (15 mg total) by mouth daily. With food 90 tablet 1   metFORMIN (GLUCOPHAGE-XR) 500 MG 24 hr tablet Take 2 tablets (1,000 mg total) by mouth daily with breakfast. 180 tablet 3   MOUNJARO 10 MG/0.5ML Pen Inject 10 mg into the skin once a week.     nitroGLYCERIN (NITRODUR - DOSED IN MG/24 HR) 0.2 mg/hr patch Place 0.2 mg onto the skin daily. Apply 1/4 patch daily as needed for tondonitis     Omega-3 1000 MG CAPS Take 1 g by mouth daily.     omeprazole (PRILOSEC) 20 MG  capsule Take 1 capsule (20 mg total) by mouth daily. 90 capsule 3   vitamin B-12 (CYANOCOBALAMIN) 100 MCG tablet Take 100 mcg by mouth daily.     vitamin C (ASCORBIC ACID) 500 MG tablet Take 1,000 mg by mouth daily.     vitamin E 400 UNIT capsule Take 400 Units by mouth daily. Reported on 02/13/2016     No current facility-administered medications on file prior to visit.   Allergies  Allergen Reactions   Penicillins Hives    Hives    Tramadol Itching   Family History  Problem Relation Age of Onset   Diabetes Mother        Living   Hyperlipidemia Mother    Tuberculosis Mother    Asthma Mother    Diabetes Father 67       Deceased   Sudden death Father    Diabetes Sister    Diabetes Brother    Kidney failure Brother 30       Diseased   Healthy Son        x1   Heart disease Maternal Aunt    Heart disease Maternal Uncle    Colon cancer Maternal Uncle        dx in 49's   Breast cancer Cousin 34   Arthritis Other        Maternal Aunts & Uncles   Esophageal cancer Neg Hx    Rectal cancer Neg Hx    Stomach cancer Neg Hx    PE: BP 122/80   Pulse 87   Ht 5\' 6"  (1.676 m)   Wt 192 lb  12.8 oz (87.5 kg)   SpO2 99%   BMI 31.12 kg/m  Wt Readings from Last 3 Encounters:  08/15/23 192 lb 12.8 oz (87.5 kg)  06/26/23 196 lb 6.4 oz (89.1 kg)  05/07/23 209 lb (94.8 kg)   Constitutional: overweight, in NAD Eyes: no exophthalmos ENT: no thyromegaly, no cervical lymphadenopathy Cardiovascular: RRR, No MRG Respiratory: CTA B Musculoskeletal: no deformities Skin: no rashes Neurological: no tremor with outstretched hands Diabetic Foot Exam - Simple   Simple Foot Form Diabetic Foot exam was performed with the following findings: Yes 08/15/2023  8:36 AM  Visual Inspection No deformities, no ulcerations, no other skin breakdown bilaterally: Yes Sensation Testing Intact to touch and monofilament testing bilaterally: Yes Pulse Check Posterior Tibialis and Dorsalis pulse intact  bilaterally: Yes Comments L hallux nail with onychodystrophy (traumatic)    ASSESSMENT: 1. DM2, insulin-dependent, uncontrolled, with complications - CAD - diast.  Dysfunction - PN  2. HL  PLAN:  1. Patient with uncontrolled type 2 diabetes, on oral antidiabetic regimen with metformin, SGLT2 inhibitor, and basal insulin, with improving control.  At last visit, HbA1c was better, at 7.8%, decreased from 12.2%.  At the time of her very high A1c, she was drinking sweet drinks and I strongly advised her to stop these.  She was also off Mounjaro for 3 weeks.  Afterwards, she was able to improve her diet and take her medications consistently but she was still off Mounjaro.  Reviewing her blood sugars, they are drastically improved, even with occasional low blood sugars in the 40s.  I advised her to reduce the Tresiba dose and discussed about possibly trying again to restart [facility] but I did not feel that this was absolutely necessary at that time.  However, since last visit, Greggory Keen PA was approved for her in 05/2023.  She lost 17 pounds after starting it. -At today's visit, she continues on [facility] but she tells me that this is again on backorder at the pharmacy.  She has 2 more doses.  We discussed about trying to obtain it from another pharmacy but if not available, I printed out a prescription for Ozempic for her and I advised her to try the 1 mg weekly dose. -Sugars at home are fluctuating, with occasional lows in the 50s and 60s.  She mentions that when the sugars are at goal at bedtime she skips the [place] to avoid dropping her sugars too low overnight.  Therefore, I advised her to reduce the Tresiba dose but I advised her not to skip it completely.  I will continue the rest of the regimen.  I refilled her prescriptions. - I suggested to:  Patient Instructions  Please continue: - Metformin ER 1000 mg with breakfast - Jardiance 25 mg before breakfast  Decrease: - Tresiba 48 units  daily  Restart: - Mounjaro 10 mg weekly/Ozempic 1 mg weekly  Take Tresiba even if sugars are at goal - but may need a lower dose.  Please return for another visit in 3-4 months.   - we checked her HbA1c: 8.1% (higher)  - advised to check sugars at different times of the day - 4x a day, rotating check times - advised for yearly eye exams >> she is not UTD - return to clinic in 3-4 months  2. HL -Reviewed the latest lipid panel from 04/2023: LDL above our goal of less than 55 due to history of cardiovascular disease, the rest of the fractions at goal: Lab Results  Component Value Date  CHOL 166 05/07/2023   HDL 77.00 05/07/2023   LDLCALC 77 05/07/2023   TRIG 56.0 05/07/2023   CHOLHDL 2 05/07/2023  -She is on Lipitor 20 mg daily and omega-3 fatty acids, without side effects  Carlus Pavlov, MD PhD [facility]

## 2023-08-15 NOTE — Patient Instructions (Addendum)
Please continue: - Metformin ER 1000 mg with breakfast - Jardiance 25 mg before breakfast  Decrease: - Tresiba 48 units daily  Restart: - Mounjaro 10 mg weekly/Ozempic 1 mg weekly  Take Tresiba even if sugars are at goal - but may need a lower dose.  Please return for another visit in 3-4 months.

## 2023-08-20 DIAGNOSIS — S2243XA Multiple fractures of ribs, bilateral, initial encounter for closed fracture: Secondary | ICD-10-CM | POA: Diagnosis not present

## 2023-08-20 DIAGNOSIS — S36031A Moderate laceration of spleen, initial encounter: Secondary | ICD-10-CM | POA: Diagnosis not present

## 2023-08-20 DIAGNOSIS — S50311A Abrasion of right elbow, initial encounter: Secondary | ICD-10-CM | POA: Diagnosis not present

## 2023-08-20 DIAGNOSIS — E119 Type 2 diabetes mellitus without complications: Secondary | ICD-10-CM | POA: Diagnosis not present

## 2023-08-20 DIAGNOSIS — I10 Essential (primary) hypertension: Secondary | ICD-10-CM | POA: Diagnosis not present

## 2023-08-20 DIAGNOSIS — M546 Pain in thoracic spine: Secondary | ICD-10-CM | POA: Diagnosis not present

## 2023-08-20 DIAGNOSIS — R079 Chest pain, unspecified: Secondary | ICD-10-CM | POA: Diagnosis not present

## 2023-08-20 DIAGNOSIS — S2221XA Fracture of manubrium, initial encounter for closed fracture: Secondary | ICD-10-CM | POA: Diagnosis not present

## 2023-08-20 DIAGNOSIS — I2699 Other pulmonary embolism without acute cor pulmonale: Secondary | ICD-10-CM | POA: Diagnosis not present

## 2023-08-20 DIAGNOSIS — J9811 Atelectasis: Secondary | ICD-10-CM | POA: Diagnosis not present

## 2023-08-20 DIAGNOSIS — J9601 Acute respiratory failure with hypoxia: Secondary | ICD-10-CM | POA: Diagnosis not present

## 2023-08-20 DIAGNOSIS — T797XXA Traumatic subcutaneous emphysema, initial encounter: Secondary | ICD-10-CM | POA: Diagnosis not present

## 2023-08-20 DIAGNOSIS — I4892 Unspecified atrial flutter: Secondary | ICD-10-CM | POA: Diagnosis not present

## 2023-08-20 DIAGNOSIS — M549 Dorsalgia, unspecified: Secondary | ICD-10-CM | POA: Diagnosis not present

## 2023-08-20 LAB — CBC AND DIFFERENTIAL
HCT: 21 — AB (ref 36–46)
Hemoglobin: 6.6 — AB (ref 12.0–16.0)
Platelets: 129 10*3/uL — AB (ref 150–400)
WBC: 6.7

## 2023-08-20 LAB — COMPREHENSIVE METABOLIC PANEL
Albumin: 3.6 (ref 3.5–5.0)
Calcium: 8.6 — AB (ref 8.7–10.7)
Globulin: 2.3
eGFR: 90

## 2023-08-20 LAB — BASIC METABOLIC PANEL
BUN: 12 (ref 4–21)
CO2: 20 (ref 13–22)
Chloride: 108 (ref 99–108)
Creatinine: 0.8 (ref 0.5–1.1)
Glucose: 330
Potassium: 3.2 mEq/L — AB (ref 3.5–5.1)
Sodium: 142 (ref 137–147)

## 2023-08-20 LAB — CBC: RBC: 2.24 — AB (ref 3.87–5.11)

## 2023-08-20 LAB — HEPATIC FUNCTION PANEL
ALT: 65 U/L — AB (ref 7–35)
AST: 93 — AB (ref 13–35)
Alkaline Phosphatase: 119 (ref 25–125)

## 2023-08-21 DIAGNOSIS — S2243XA Multiple fractures of ribs, bilateral, initial encounter for closed fracture: Secondary | ICD-10-CM | POA: Insufficient documentation

## 2023-08-21 DIAGNOSIS — S2249XA Multiple fractures of ribs, unspecified side, initial encounter for closed fracture: Secondary | ICD-10-CM | POA: Insufficient documentation

## 2023-08-21 DIAGNOSIS — E119 Type 2 diabetes mellitus without complications: Secondary | ICD-10-CM

## 2023-08-21 DIAGNOSIS — S2221XA Fracture of manubrium, initial encounter for closed fracture: Secondary | ICD-10-CM

## 2023-08-21 DIAGNOSIS — S36039A Unspecified laceration of spleen, initial encounter: Secondary | ICD-10-CM | POA: Insufficient documentation

## 2023-08-21 DIAGNOSIS — J982 Interstitial emphysema: Secondary | ICD-10-CM | POA: Insufficient documentation

## 2023-08-21 DIAGNOSIS — Z8781 Personal history of (healed) traumatic fracture: Secondary | ICD-10-CM | POA: Insufficient documentation

## 2023-08-21 HISTORY — DX: Fracture of manubrium, initial encounter for closed fracture: S22.21XA

## 2023-08-21 HISTORY — DX: Long term (current) use of insulin: E11.9

## 2023-08-24 DIAGNOSIS — I2699 Other pulmonary embolism without acute cor pulmonale: Secondary | ICD-10-CM | POA: Insufficient documentation

## 2023-08-24 DIAGNOSIS — J9601 Acute respiratory failure with hypoxia: Secondary | ICD-10-CM | POA: Diagnosis not present

## 2023-08-24 DIAGNOSIS — S2243XA Multiple fractures of ribs, bilateral, initial encounter for closed fracture: Secondary | ICD-10-CM | POA: Diagnosis not present

## 2023-08-24 DIAGNOSIS — S2221XA Fracture of manubrium, initial encounter for closed fracture: Secondary | ICD-10-CM | POA: Diagnosis not present

## 2023-08-24 HISTORY — DX: Other pulmonary embolism without acute cor pulmonale: I26.99

## 2023-08-25 DIAGNOSIS — R4182 Altered mental status, unspecified: Secondary | ICD-10-CM | POA: Diagnosis not present

## 2023-08-26 DIAGNOSIS — I2699 Other pulmonary embolism without acute cor pulmonale: Secondary | ICD-10-CM | POA: Diagnosis not present

## 2023-08-26 DIAGNOSIS — J9601 Acute respiratory failure with hypoxia: Secondary | ICD-10-CM | POA: Diagnosis not present

## 2023-08-26 DIAGNOSIS — R4182 Altered mental status, unspecified: Secondary | ICD-10-CM | POA: Diagnosis not present

## 2023-08-26 DIAGNOSIS — S2221XA Fracture of manubrium, initial encounter for closed fracture: Secondary | ICD-10-CM | POA: Diagnosis not present

## 2023-08-26 DIAGNOSIS — S2243XA Multiple fractures of ribs, bilateral, initial encounter for closed fracture: Secondary | ICD-10-CM | POA: Diagnosis not present

## 2023-08-26 DIAGNOSIS — S36039A Unspecified laceration of spleen, initial encounter: Secondary | ICD-10-CM | POA: Diagnosis not present

## 2023-08-27 ENCOUNTER — Other Ambulatory Visit: Payer: Self-pay | Admitting: Internal Medicine

## 2023-08-27 DIAGNOSIS — S36039A Unspecified laceration of spleen, initial encounter: Secondary | ICD-10-CM | POA: Diagnosis not present

## 2023-08-27 DIAGNOSIS — J9601 Acute respiratory failure with hypoxia: Secondary | ICD-10-CM | POA: Diagnosis not present

## 2023-08-27 DIAGNOSIS — S2221XA Fracture of manubrium, initial encounter for closed fracture: Secondary | ICD-10-CM | POA: Diagnosis not present

## 2023-08-27 DIAGNOSIS — I2699 Other pulmonary embolism without acute cor pulmonale: Secondary | ICD-10-CM | POA: Diagnosis not present

## 2023-08-27 DIAGNOSIS — R7881 Bacteremia: Secondary | ICD-10-CM

## 2023-08-27 DIAGNOSIS — S2243XA Multiple fractures of ribs, bilateral, initial encounter for closed fracture: Secondary | ICD-10-CM | POA: Diagnosis not present

## 2023-08-27 DIAGNOSIS — R4182 Altered mental status, unspecified: Secondary | ICD-10-CM | POA: Diagnosis not present

## 2023-08-27 HISTORY — DX: Bacteremia: R78.81

## 2023-08-27 MED ORDER — DEXCOM G7 SENSOR MISC: 3.0000 | 4 refills | Status: DC

## 2023-08-28 ENCOUNTER — Telehealth: Payer: Self-pay | Admitting: Nurse Practitioner
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
## 2023-08-30 ENCOUNTER — Encounter: Payer: Self-pay | Admitting: Nurse Practitioner

## 2023-08-30 NOTE — Transitions of Care (Post Inpatient/ED Visit) (Signed)
08/30/2023  Name: [name] MRN: [id number] DOB: 1961-04-12  Today's TOC FU Call Status: Today's TOC FU Call Status:: Successful TOC FU Call Completed TOC FU Call Complete Date: 08/30/23 Patient's Name and Date of Birth confirmed.  Transition Care Management Follow-up Telephone Call Date of Discharge: 08/27/23 Discharge Facility: Other ([facility]) Name of Other ([facility]) [facility]charge Facility[facility] Type of Discharge: Emergency Department How have you been since you were released from the hospital?: Same Any questions or concerns?: No  Items Reviewed: Did you receive and understand the discharge instructions provided?: Yes Medications obtained,verified, and reconciled?: No Any new allergies since your discharge?: No Dietary orders reviewed?: NA Do you have support at home?: Yes  Medications Reviewed Today: Medications Reviewed Today   Medications were not reviewed in this encounter     Home Care and Equipment/Supplies: Were Home Health Services Ordered?: NA Any new equipment or medical supplies ordered?: NA  Functional Questionnaire: Do you need assistance with bathing/showering or dressing?: No Do you need assistance with meal preparation?: No Do you need assistance with eating?: No Do you have difficulty maintaining continence: No Do you need assistance with getting out of bed/getting out of a chair/moving?: No Do you have difficulty managing or taking your medications?: No  Follow up appointments reviewed: PCP Follow-up appointment confirmed?: Yes Date of PCP follow-up appointment?: 09/03/23 Follow-up Provider: [facility] Follow-up appointment confirmed?: No Do you need transportation to your follow-up appointment?: No Do you understand care options if your condition(s) worsen?: Yes-patient verbalized understanding    SIGNATURE Arvil Persons, BSN, RN

## 2023-09-03 ENCOUNTER — Ambulatory Visit: Payer: Medicaid Other | Admitting: Nurse Practitioner

## 2023-09-03 ENCOUNTER — Encounter: Payer: Self-pay | Admitting: Nurse Practitioner

## 2023-09-03 VITALS — BP 140/90 | HR 94 | Wt 194.6 lb

## 2023-09-03 DIAGNOSIS — S2243XA Multiple fractures of ribs, bilateral, initial encounter for closed fracture: Secondary | ICD-10-CM

## 2023-09-03 DIAGNOSIS — R7881 Bacteremia: Secondary | ICD-10-CM | POA: Diagnosis not present

## 2023-09-03 DIAGNOSIS — J982 Interstitial emphysema: Secondary | ICD-10-CM

## 2023-09-03 DIAGNOSIS — S2221XD Fracture of manubrium, subsequent encounter for fracture with routine healing: Secondary | ICD-10-CM

## 2023-09-03 DIAGNOSIS — I2699 Other pulmonary embolism without acute cor pulmonale: Secondary | ICD-10-CM

## 2023-09-03 DIAGNOSIS — I10 Essential (primary) hypertension: Secondary | ICD-10-CM | POA: Diagnosis not present

## 2023-09-03 DIAGNOSIS — S36039D Unspecified laceration of spleen, subsequent encounter: Secondary | ICD-10-CM | POA: Diagnosis not present

## 2023-09-03 LAB — COMPREHENSIVE METABOLIC PANEL
ALT: 20 U/L (ref 0–35)
AST: 18 U/L (ref 0–37)
Albumin: 3.4 g/dL — ABNORMAL LOW (ref 3.5–5.2)
Alkaline Phosphatase: 169 U/L — ABNORMAL HIGH (ref 39–117)
BUN: 11 mg/dL (ref 6–23)
CO2: 30 mEq/L (ref 19–32)
Calcium: 9.2 mg/dL (ref 8.4–10.5)
Chloride: 106 mEq/L (ref 96–112)
Creatinine, Ser: 0.73 mg/dL (ref 0.40–1.20)
GFR: 87.22 mL/min (ref >60.00)
Glucose, Bld: 100 mg/dL — ABNORMAL HIGH (ref 70–99)
Potassium: 4.6 mEq/L (ref 3.5–5.1)
Sodium: 143 mEq/L (ref 135–145)
Total Bilirubin: 0.6 mg/dL (ref 0.2–1.2)
Total Protein: 6.2 g/dL (ref 6.0–8.3)

## 2023-09-03 LAB — CBC WITH DIFFERENTIAL/PLATELET
Basophils Absolute: 0 10*3/uL (ref 0.0–0.1)
Basophils Relative: 0.5 % (ref 0.0–3.0)
Eosinophils Absolute: 0.1 10*3/uL (ref 0.0–0.7)
Eosinophils Relative: 0.8 % (ref 0.0–5.0)
HCT: 30.2 % — ABNORMAL LOW (ref 36.0–46.0)
Hemoglobin: 9.6 g/dL — ABNORMAL LOW (ref 12.0–15.0)
Lymphocytes Relative: 24.4 % (ref 12.0–46.0)
Lymphs Abs: 2.1 10*3/uL (ref 0.7–4.0)
MCHC: 31.7 g/dL (ref 30.0–36.0)
MCV: 90.2 fl (ref 78.0–100.0)
Monocytes Absolute: 0.5 10*3/uL (ref 0.1–1.0)
Monocytes Relative: 6.1 % (ref 3.0–12.0)
Neutro Abs: 5.9 10*3/uL (ref 1.4–7.7)
Neutrophils Relative %: 68.2 % (ref 43.0–77.0)
Platelets: 558 10*3/uL — ABNORMAL HIGH (ref 150.0–400.0)
RBC: 3.35 Mil/uL — ABNORMAL LOW (ref 3.87–5.11)
RDW: 14.3 % (ref 11.5–15.5)
WBC: 8.7 10*3/uL (ref 4.0–10.5)

## 2023-09-03 MED ORDER — HYDROCODONE-ACETAMINOPHEN 5-325 MG PO TABS: 1.0000 | ORAL_TABLET | Freq: Four times a day (QID) | ORAL | 0 refills | Status: AC | PRN

## 2023-09-03 NOTE — Assessment & Plan Note (Addendum)
Elevated BP today due to lack of AM med dose. Echo 08/2023: Mild LVH, No hemodynamically significant valve disease. No significant pericardial effusion. There are no prior studies for comparison.  BP Readings from Last 3 Encounters:  09/03/23 (!) 140/90  08/15/23 122/80  06/26/23 130/82    Maintain med doses Repeat BMP F/up in 2weeks
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
## 2023-09-03 NOTE — Patient Instructions (Addendum)
Stop all NSAIDs including meloxicam Use tylenol 500mg  every 8hrs for mild to moderate pain, and hydrocodone every 6hrs for severe pain Limit tylenol or acetaminophen to 3000mg  per day. Use incentive spirometry every 1-2hrs x 10 repetition
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
## 2023-09-09 DIAGNOSIS — S32049A Unspecified fracture of fourth lumbar vertebra, initial encounter for closed fracture: Secondary | ICD-10-CM | POA: Insufficient documentation

## 2023-09-09 NOTE — Assessment & Plan Note (Addendum)
Treated with rocephin IV and keflex 500mg  QID x10days
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
## 2023-09-09 NOTE — Assessment & Plan Note (Addendum)
Bilateral 1st-5th Current use of oxycodone and tylenol 500mg  every 8hrs, with minimal relief. Pain at 7/10 with movement and Pain at 4/10 at rest Refilled oxycodone Prescription Stop all NSAIDs including meloxicam Use tylenol 500mg  every 8hrs for mild to moderate pain, and hydrocodone every 6hrs for severe pain Limit tylenol or acetaminophen to 3000mg  per day. Use incentive spirometry every 1-2hrs x 10 repetition F/up in 2weeks
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
## 2023-09-10 ENCOUNTER — Ambulatory Visit: Payer: Medicaid Other | Attending: Cardiology | Admitting: Cardiology

## 2023-09-10 ENCOUNTER — Telehealth: Payer: Self-pay

## 2023-09-10 ENCOUNTER — Encounter: Payer: Self-pay | Admitting: Cardiology

## 2023-09-10 VITALS — BP 148/92 | HR 94 | Ht 66.0 in | Wt 192.0 lb

## 2023-09-10 DIAGNOSIS — E782 Mixed hyperlipidemia: Secondary | ICD-10-CM | POA: Diagnosis not present

## 2023-09-10 DIAGNOSIS — I2699 Other pulmonary embolism without acute cor pulmonale: Secondary | ICD-10-CM

## 2023-09-10 DIAGNOSIS — G4730 Sleep apnea, unspecified: Secondary | ICD-10-CM

## 2023-09-10 DIAGNOSIS — I10 Essential (primary) hypertension: Secondary | ICD-10-CM

## 2023-09-10 DIAGNOSIS — G4733 Obstructive sleep apnea (adult) (pediatric): Secondary | ICD-10-CM

## 2023-09-10 DIAGNOSIS — E66811 Obesity, class 1: Secondary | ICD-10-CM | POA: Diagnosis not present

## 2023-09-10 DIAGNOSIS — I2510 Atherosclerotic heart disease of native coronary artery without angina pectoris: Secondary | ICD-10-CM

## 2023-09-10 DIAGNOSIS — Z419 Encounter for procedure for purposes other than remedying health state, unspecified: Secondary | ICD-10-CM | POA: Diagnosis not present

## 2023-09-10 MED ORDER — LOSARTAN POTASSIUM 50 MG PO TABS: 50.0000 mg | ORAL_TABLET | Freq: Two times a day (BID) | ORAL | 3 refills | Status: DC
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
## 2023-09-10 NOTE — Telephone Encounter (Signed)
CPAP Order sent to [facility], transfer from [facility]

## 2023-09-10 NOTE — Patient Instructions (Signed)
Medication Instructions:  Your physician has recommended you make the following change in your medication:  INCREASE: Losartan 50 mg twice daily *If you need a refill on your cardiac medications before your next appointment, please call your pharmacy*   Lab Work: None   Testing/Procedures: None   Follow-Up: At [facility], you and your health needs are our priority.  As part of our continuing mission to provide you with exceptional heart care, we have created designated Provider Care Teams.  These Care Teams include your primary Cardiologist (physician) and Advanced Practice Providers (APPs -  Physician Assistants and Nurse Practitioners) who all work together to provide you with the care you need, when you need it.    Your next appointment:   6 month(s)  Provider:   Thomasene Ripple, DO

## 2023-09-13 ENCOUNTER — Encounter: Payer: Self-pay | Admitting: Nurse Practitioner

## 2023-09-13 DIAGNOSIS — S36039D Unspecified laceration of spleen, subsequent encounter: Secondary | ICD-10-CM

## 2023-09-16 ENCOUNTER — Ambulatory Visit (INDEPENDENT_AMBULATORY_CARE_PROVIDER_SITE_OTHER): Payer: Medicaid Other

## 2023-09-16 DIAGNOSIS — K5710 Diverticulosis of small intestine without perforation or abscess without bleeding: Secondary | ICD-10-CM | POA: Diagnosis not present

## 2023-09-16 DIAGNOSIS — S36039D Unspecified laceration of spleen, subsequent encounter: Secondary | ICD-10-CM

## 2023-09-16 DIAGNOSIS — D7389 Other diseases of spleen: Secondary | ICD-10-CM | POA: Diagnosis not present

## 2023-09-16 MED ORDER — IOHEXOL 300 MG/ML  SOLN
100.0000 mL | Freq: Once | INTRAMUSCULAR | Status: AC | PRN
  Administered 2023-09-16: 100 mL via INTRAVENOUS

## 2023-09-17 ENCOUNTER — Encounter: Payer: Self-pay | Admitting: Nurse Practitioner

## 2023-09-17 ENCOUNTER — Ambulatory Visit (INDEPENDENT_AMBULATORY_CARE_PROVIDER_SITE_OTHER): Payer: Medicaid Other | Admitting: Nurse Practitioner

## 2023-09-17 VITALS — BP 134/80 | HR 83 | Temp 98.2°F | Ht 66.0 in | Wt 185.6 lb

## 2023-09-17 DIAGNOSIS — S36039D Unspecified laceration of spleen, subsequent encounter: Secondary | ICD-10-CM | POA: Diagnosis not present

## 2023-09-17 DIAGNOSIS — S2242XD Multiple fractures of ribs, left side, subsequent encounter for fracture with routine healing: Secondary | ICD-10-CM

## 2023-09-17 DIAGNOSIS — S36039S Unspecified laceration of spleen, sequela: Secondary | ICD-10-CM

## 2023-09-17 DIAGNOSIS — J90 Pleural effusion, not elsewhere classified: Secondary | ICD-10-CM | POA: Diagnosis not present

## 2023-09-17 DIAGNOSIS — J982 Interstitial emphysema: Secondary | ICD-10-CM

## 2023-09-17 DIAGNOSIS — S2242XS Multiple fractures of ribs, left side, sequela: Secondary | ICD-10-CM

## 2023-09-17 HISTORY — DX: Pleural effusion, not elsewhere classified: J90

## 2023-09-17 NOTE — Assessment & Plan Note (Addendum)
Stable, has intermittent SOB with exertion Persistent chest wall pain with movement. CT 09/17/23 indicates similar finding on CXR and chest CT 08/24/23: Lower chest: Small left greater than right pleural effusions with mild dependent left lower lobe atelectasis. No evidence of basilar pneumothorax.  Diminished lungs sounds in bilateral lung bases, no rales noted. Denies any fever or night sweats. Repeat CXR in 3-4weeks Advised to continue deep breathing exercise. Also provide ED precautions

## 2023-09-17 NOTE — Patient Instructions (Signed)
Continue deep breathing exercise. Go to [address] for CXR in 3-4weeks. Complete CXR prior to appointment with me in 69month Maintain appointment with general Surgeon Start oral probiotics-curturelle or align 1cap daily Call office if you develop fever/chills/night sweats, worsening SOB, worsening pain, worsening coughing.
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
## 2023-09-17 NOTE — Assessment & Plan Note (Signed)
Resolved abdominal wall hematoma. She denies any ABDOMEN pain or nausea. She has appointment with Dr. Emeterio Reeve surgeon 10/03/23. Today we discussed findings on repeat CT ABDOMEN/pelvis. Advised to maintain upcoming appointment with surgeon

## 2023-09-18 ENCOUNTER — Encounter: Payer: Self-pay | Admitting: Nurse Practitioner

## 2023-09-18 NOTE — Assessment & Plan Note (Addendum)
Small bilateral pleural effusion L>R per Ct on 09/17/2023. This is also mention on CT report on 08/20/23. No respiratory distress. Plan to repeat CXR in 25month

## 2023-09-23 ENCOUNTER — Telehealth: Payer: Self-pay

## 2023-09-23 ENCOUNTER — Telehealth: Payer: Self-pay | Admitting: Nurse Practitioner

## 2023-09-23 NOTE — Telephone Encounter (Signed)
10.14.24 - Pt dropped off Disability Claim Form to be filled out by her pcp. She wants form faxed to [phone] after completion. Form is in provider's folder at FO.

## 2023-09-23 NOTE — Telephone Encounter (Signed)
Disc with patient's CT Abdomen/Pelvis/Chest Results received from [facility].  PCP, Alysia Penna, NP, advises the disc needs to be sent to [facility], Atnn: Dr. Carey Bullocks.

## 2023-09-23 NOTE — Telephone Encounter (Signed)
Form faxed w/ confirmation

## 2023-09-23 NOTE — Telephone Encounter (Signed)
CLINICAL USE BELOW THIS LINE (use X to signify action taken)  _X__ Form received and placed in providers office for signature. ___ Form completed and faxed to LOA Dept.  ___ Form completed & LVM to notify patient ready for pick up.  ___ Charge sheet and copy of form in front office folder for office supervisor.

## 2023-09-23 NOTE — Telephone Encounter (Signed)
Pt informed that disc is in the FO for pickup for her to take to [facility].  [facility] [address] [place], [place] [address] Attn: Dr. Carey Bullocks

## 2023-09-30 ENCOUNTER — Ambulatory Visit
Admission: RE | Admit: 2023-09-30 | Discharge: 2023-09-30 | Disposition: A | Discharge status: Home / Self Care | Payer: Medicaid Other | Source: Ambulatory Visit | Attending: Nurse Practitioner | Admitting: Nurse Practitioner

## 2023-09-30 DIAGNOSIS — S2242XD Multiple fractures of ribs, left side, subsequent encounter for fracture with routine healing: Secondary | ICD-10-CM | POA: Diagnosis not present

## 2023-09-30 DIAGNOSIS — J918 Pleural effusion in other conditions classified elsewhere: Secondary | ICD-10-CM

## 2023-09-30 DIAGNOSIS — S2242XS Multiple fractures of ribs, left side, sequela: Secondary | ICD-10-CM | POA: Diagnosis not present

## 2023-09-30 DIAGNOSIS — J90 Pleural effusion, not elsewhere classified: Secondary | ICD-10-CM | POA: Diagnosis not present

## 2023-10-03 DIAGNOSIS — S2222XA Fracture of body of sternum, initial encounter for closed fracture: Secondary | ICD-10-CM | POA: Diagnosis not present

## 2023-10-07 DIAGNOSIS — Z0279 Encounter for issue of other medical certificate: Secondary | ICD-10-CM

## 2023-10-10 ENCOUNTER — Ambulatory Visit: Payer: Medicaid Other | Attending: Surgery | Admitting: Occupational Therapy

## 2023-10-10 DIAGNOSIS — M25612 Stiffness of left shoulder, not elsewhere classified: Secondary | ICD-10-CM | POA: Diagnosis not present

## 2023-10-10 DIAGNOSIS — M25611 Stiffness of right shoulder, not elsewhere classified: Secondary | ICD-10-CM | POA: Diagnosis not present

## 2023-10-10 DIAGNOSIS — M6281 Muscle weakness (generalized): Secondary | ICD-10-CM
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
## 2023-10-10 NOTE — Patient Instructions (Addendum)
You should avoid the following :overhead lifting, pushing, pulling, and lifting objects that weigh more than 5 lbs  Avoid activities that  place stress through the sternum, avoid lying face down and applying direct pressure or impact to the chest, until the fracture has healed.   Shoulder: Flexion (Supine)    With hands shoulder width apart, head. Raise arms up slowly  to approximately 45*- 60* keeping arms close to your body Do not let elbows bend. Keep back flat. Hold __5__ seconds. Repeat __10__ times. Do _1-2___ sessions per day. CAUTION: Stretch slowly and gently.  ELBOW: Flexion (Cane)    Hold cane with both hands. Bend and straighten elbows. Hold ___ seconds.No weight _10__ reps per set, _1-2__ sets per day, _7__ days per week  Copyright  VHI. All rights reserved.

## 2023-10-11 DIAGNOSIS — Z419 Encounter for procedure for purposes other than remedying health state, unspecified: Secondary | ICD-10-CM | POA: Diagnosis not present

## 2023-10-16 ENCOUNTER — Encounter: Payer: Self-pay | Admitting: Nurse Practitioner

## 2023-10-16 ENCOUNTER — Ambulatory Visit: Payer: Medicaid Other | Attending: Surgery | Admitting: Occupational Therapy

## 2023-10-16 ENCOUNTER — Encounter: Payer: Self-pay | Admitting: Occupational Therapy

## 2023-10-16 ENCOUNTER — Ambulatory Visit: Payer: Medicaid Other | Admitting: Nurse Practitioner

## 2023-10-16 VITALS — BP 140/80 | HR 75 | Temp 98.2°F | Resp 18 | Wt 193.0 lb

## 2023-10-16 DIAGNOSIS — S2242XS Multiple fractures of ribs, left side, sequela: Secondary | ICD-10-CM

## 2023-10-16 DIAGNOSIS — R7989 Other specified abnormal findings of blood chemistry: Secondary | ICD-10-CM | POA: Diagnosis not present

## 2023-10-16 DIAGNOSIS — M6281 Muscle weakness (generalized): Secondary | ICD-10-CM | POA: Diagnosis not present

## 2023-10-16 DIAGNOSIS — I10 Essential (primary) hypertension: Secondary | ICD-10-CM

## 2023-10-16 DIAGNOSIS — M25611 Stiffness of right shoulder, not elsewhere classified: Secondary | ICD-10-CM | POA: Insufficient documentation

## 2023-10-16 DIAGNOSIS — M25612 Stiffness of left shoulder, not elsewhere classified: Secondary | ICD-10-CM | POA: Diagnosis not present

## 2023-10-16 DIAGNOSIS — J90 Pleural effusion, not elsewhere classified: Secondary | ICD-10-CM

## 2023-10-16 LAB — HEPATIC FUNCTION PANEL
ALT: 10 U/L (ref 0–35)
AST: 14 U/L (ref 0–37)
Albumin: 4.2 g/dL (ref 3.5–5.2)
Alkaline Phosphatase: 141 U/L — ABNORMAL HIGH (ref 39–117)
Bilirubin, Direct: 0.1 mg/dL (ref 0.0–0.3)
Total Bilirubin: 0.4 mg/dL (ref 0.2–1.2)
Total Protein: 6.9 g/dL (ref 6.0–8.3)
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
## 2023-10-16 NOTE — Assessment & Plan Note (Signed)
Monitor BP at home, daily in AM, use Upper arm cuff Send BP readings via mychart in [place]. Maintain DASH diet F/up in 70month Maintain losartan 50mg  BID dose

## 2023-10-16 NOTE — Patient Instructions (Signed)
Go to lab Monitor BP at home, daily in AM, use Upper arm cuff Send BP readings via mychart in 1weeks. Maintain DASH diet Continue breathing exercise

## 2023-10-16 NOTE — Assessment & Plan Note (Signed)
Repeat CXR 09/30/2023: Small bilateral pleural effusions. Healing left lateral rib fractures. She reports persistent SOB with exertion (climbing stairs). No cough, no fever.  Advised to continue breathing exercise with incentive spirometry and pain medication per trauma surgeon.

## 2023-10-16 NOTE — Progress Notes (Signed)
Established Patient Visit  Patient: [name]   DOB: [birth year]   62 y.o. Female  MRN: [id number] Visit Date: 10/16/2023  Subjective:    Chief Complaint  Patient presents with   OFFICE VISIT     PT is here for 1 month F/U pleural effusion, rib fracture and repeat hepatic panel. PT C/O of rib pain.    Pain Management   HPI Pleural effusion, bilateral Repeat CXR 09/30/2023: Small bilateral pleural effusions. Healing left lateral rib fractures. She reports persistent SOB with exertion (climbing stairs). No cough, no fever.  Advised to continue breathing exercise with incentive spirometry and pain medication per trauma surgeon.  Multiple rib fractures Repeat CXR 09/30/2023: Small bilateral pleural effusions. Healing left lateral rib fractures. She reports persistent SOB with exertion (climbing stairs). No cough, no fever.  Advised to continue breathing exercise with incentive spirometry and pain medication per trauma surgeon.  Essential hypertension Monitor BP at home, daily in AM, use Upper arm cuff Send BP readings via mychart in [place]. Maintain DASH diet F/up in 24month Maintain losartan 50mg  BID dose  SOB with exertion-climbing a flight of stair. LUQ ABDOMEN pain, no nausea, no change in GI/GU function. Has another appointment with Trauma surgeon 10/31/2023.  BP Readings from Last 3 Encounters:  10/16/23 (!) 140/80  09/17/23 134/80  09/10/23 (!) 148/92    Reviewed medical, surgical, and social history today  Medications: Outpatient Medications Prior to Visit  Medication Sig Note   acetaminophen (TYLENOL) 325 MG suppository Place 325 mg rectally every 4 (four) hours as needed.    apixaban (ELIQUIS) 5 MG TABS tablet Take 5 mg by mouth 2 (two) times daily.    atorvastatin (LIPITOR) 20 MG tablet Take 1 tablet (20 mg total) by mouth at bedtime.    azelastine (ASTELIN) 0.1 % nasal spray Place 2 sprays into both nostrils 2 (two) times daily. Use in each  nostril as directed    BLACK COHOSH EXTRACT PO Take 1 tablet by mouth daily.    cholecalciferol (VITAMIN D3) 25 MCG (1000 UNIT) tablet Take 1,000 Units by mouth daily.    Continuous Glucose Sensor (DEXCOM G7 SENSOR) MISC 3 each by Does not apply route every 30 (thirty) days. Apply 1 sensor every 10 days    Continuous Glucose Sensor (FREESTYLE LIBRE 3 SENSOR) MISC 1 each by Does not apply route every 14 (fourteen) days.    Echinacea-Goldenseal (ECHINACEA COMB/GOLDEN SEAL PO) Take 1 capsule by mouth daily as needed (Allergies).    empagliflozin (JARDIANCE) 25 MG TABS tablet Take 1 tablet (25 mg total) by mouth daily.    fluocinonide cream (LIDEX) 0.05 % Apply 1 application topically 2 (two) times daily as needed (Itching).    fluticasone (FLONASE) 50 MCG/ACT nasal spray Place 2 sprays into both nostrils daily.    furosemide (LASIX) 20 MG tablet Take 1 tablet (20 mg total) by mouth daily as needed for edema.    glucose blood (CONTOUR NEXT TEST) test strip Use as instructed to check 2X daily    insulin degludec (TRESIBA FLEXTOUCH) 200 UNIT/ML FlexTouch Pen Inject 60 Units into the skin daily.    Insulin Pen Needle 32G X 4 MM MISC Use 4x a day    levocetirizine (XYZAL) 5 MG tablet Take 1 tablet (5 mg total) by mouth every evening.    losartan (COZAAR) 50 MG tablet Take 1 tablet (50 mg total) by mouth in the  morning and at bedtime.    metFORMIN (GLUCOPHAGE-XR) 500 MG 24 hr tablet Take 2 tablets (1,000 mg total) by mouth daily with breakfast.    methocarbamol (ROBAXIN) 750 MG tablet Take 750 mg by mouth.    nitroGLYCERIN (NITRODUR - DOSED IN MG/24 HR) 0.2 mg/hr patch Place 0.2 mg onto the skin daily. Apply 1/4 patch daily as needed for tondonitis    omeprazole (PRILOSEC) 20 MG capsule Take 1 capsule (20 mg total) by mouth daily.    oxyCODONE (OXY IR/ROXICODONE) 5 MG immediate release tablet Take 5 mg by mouth every 4 (four) hours as needed.    Semaglutide, 1 MG/DOSE, (OZEMPIC, 1 MG/DOSE,) 4 MG/3ML SOPN  Inject 1 mg into the skin once a week.    vitamin B-12 (CYANOCOBALAMIN) 100 MCG tablet Take 100 mcg by mouth daily. 10/16/2023: Refills needed   vitamin C (ASCORBIC ACID) 500 MG tablet Take 1,000 mg by mouth daily. 10/16/2023: Refills needed   vitamin E 400 UNIT capsule Take 400 Units by mouth daily. Reported on 02/13/2016 10/16/2023: Refills needed    [DISCONTINUED] cyclobenzaprine (FLEXERIL) 10 MG tablet Take by mouth.    [DISCONTINUED] tirzepatide (MOUNJARO) 10 MG/0.5ML Pen Inject 10 mg into the skin.    [DISCONTINUED] lidocaine (LIDODERM) 5 % Place 1 patch onto the skin daily. Remove & Discard patch within 12 hours or as directed by MD (Patient not taking: Reported on 10/16/2023)    [DISCONTINUED] meloxicam (MOBIC) 15 MG tablet Take 1 tablet (15 mg total) by mouth daily. With food (Patient not taking: Reported on 10/16/2023)    [DISCONTINUED] Omega-3 1000 MG CAPS Take 1 g by mouth daily. (Patient not taking: Reported on 10/16/2023)    No facility-administered medications prior to visit.   Reviewed past medical and social history.   ROS per HPI above      Objective:  BP (!) 140/80   Pulse 75   Temp 98.2 F (36.8 C) (Temporal)   Resp 18   Wt 193 lb (87.5 kg)   SpO2 100%   BMI 31.15 kg/m      Physical Exam Vitals and nursing note reviewed.  Cardiovascular:     Rate and Rhythm: Normal rate and regular rhythm.     Pulses: Normal pulses.     Heart sounds: Normal heart sounds.  Pulmonary:     Effort: Pulmonary effort is normal. No respiratory distress.     Breath sounds: No stridor. Examination of the right-lower field reveals decreased breath sounds. Examination of the left-lower field reveals decreased breath sounds. Decreased breath sounds present. No wheezing, rhonchi or rales.  Chest:     Chest wall: Tenderness present.  Abdominal:     General: There is no distension.     Palpations: Abdomen is soft.     Tenderness: There is no guarding.  Musculoskeletal:     Right lower leg:  No edema.     Left lower leg: No edema.  Neurological:     Mental Status: She is alert and oriented to person, place, and time.     No results found for any visits on 10/16/23.    Assessment & Plan:    Problem List Items Addressed This Visit     Essential hypertension    Monitor BP at home, daily in AM, use Upper arm cuff Send BP readings via mychart in 1weeks. Maintain DASH diet F/up in 74month Maintain losartan 50mg  BID dose      Multiple rib fractures    Repeat CXR 09/30/2023:  Small bilateral pleural effusions. Healing left lateral rib fractures. She reports persistent SOB with exertion (climbing stairs). No cough, no fever.  Advised to continue breathing exercise with incentive spirometry and pain medication per trauma surgeon.      Pleural effusion, bilateral    Repeat CXR 09/30/2023: Small bilateral pleural effusions. Healing left lateral rib fractures. She reports persistent SOB with exertion (climbing stairs). No cough, no fever.  Advised to continue breathing exercise with incentive spirometry and pain medication per trauma surgeon.      Other Visit Diagnoses     Elevated LFTs    -  Primary   Relevant Orders   Hepatic function panel      Return for maintain upcoming appt.     Alysia Penna, NP
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
## 2023-10-22 ENCOUNTER — Telehealth: Payer: Self-pay | Admitting: Nurse Practitioner

## 2023-10-22 NOTE — Telephone Encounter (Signed)
[employer] [place] ( [employer] called [phone] ) said they faxed over a dr evidence request and you did send some pages back but they did not receive them all . Out of 49 pages they only received back 12

## 2023-10-22 NOTE — Telephone Encounter (Signed)
Paperwork was faxed and sent today with confirmation attached; [name] was called and notified. He will follow up with me to make sure received.

## 2023-10-29 ENCOUNTER — Other Ambulatory Visit (HOSPITAL_COMMUNITY): Payer: Self-pay

## 2023-10-29 ENCOUNTER — Ambulatory Visit: Payer: Medicaid Other | Admitting: Occupational Therapy

## 2023-10-29 DIAGNOSIS — M25611 Stiffness of right shoulder, not elsewhere classified: Secondary | ICD-10-CM | POA: Diagnosis not present

## 2023-10-29 DIAGNOSIS — M6281 Muscle weakness (generalized): Secondary | ICD-10-CM

## 2023-10-29 DIAGNOSIS — M25612 Stiffness of left shoulder, not elsewhere classified: Secondary | ICD-10-CM
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
## 2023-10-31 DIAGNOSIS — S2222XD Fracture of body of sternum, subsequent encounter for fracture with routine healing: Secondary | ICD-10-CM | POA: Diagnosis not present

## 2023-11-01 ENCOUNTER — Telehealth: Payer: Self-pay

## 2023-11-01 NOTE — Telephone Encounter (Signed)
Patient states she needs a PA for [employer]

## 2023-11-04 ENCOUNTER — Telehealth: Payer: Self-pay

## 2023-11-04 ENCOUNTER — Other Ambulatory Visit (HOSPITAL_COMMUNITY): Payer: Self-pay

## 2023-11-04 ENCOUNTER — Ambulatory Visit: Payer: Medicaid Other | Admitting: Occupational Therapy

## 2023-11-04 DIAGNOSIS — M6281 Muscle weakness (generalized): Secondary | ICD-10-CM

## 2023-11-04 DIAGNOSIS — M25612 Stiffness of left shoulder, not elsewhere classified: Secondary | ICD-10-CM

## 2023-11-04 DIAGNOSIS — M25611 Stiffness of right shoulder, not elsewhere classified: Secondary | ICD-10-CM | POA: Diagnosis not present

## 2023-11-04 NOTE — Telephone Encounter (Signed)
Pharmacy Patient Advocate Encounter   Received notification from Pt Calls Messages that prior authorization for [employer] is required/requested.   I am unable to verify insurance. Please have pt send updated insurance info

## 2023-11-04 NOTE — Patient Instructions (Signed)
ELBOW: Flexion (Weight)    Start with elbow straight, palm facing forward. Bend elbow. Hold __3_ seconds. Use __1_ lb weight. support arm on pillow __15 reps per set, _1__ sets per day, _7__ days per week Use __1_ lb dumbbell.  Wrist Flexion: Resisted    With right palm up, __1__ pound weight in hand, bend wrist up. Return slowly. perfrom with palm down 10-15 reps Repeat _15___ times per set. Do __1__ sets per session. Do __1__ sessions per day.  Copyright  VHI. All rights reserved.    Copyright  VHI. All rights reserved.
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
## 2023-11-10 DIAGNOSIS — Z419 Encounter for procedure for purposes other than remedying health state, unspecified: Secondary | ICD-10-CM | POA: Diagnosis not present

## 2023-11-12 ENCOUNTER — Ambulatory Visit: Payer: Medicaid Other | Admitting: Nurse Practitioner

## 2023-11-12 ENCOUNTER — Encounter: Payer: Self-pay | Admitting: Nurse Practitioner

## 2023-11-12 VITALS — BP 150/90 | HR 81 | Temp 98.0°F | Resp 18 | Ht 66.0 in | Wt 190.4 lb

## 2023-11-12 DIAGNOSIS — I10 Essential (primary) hypertension: Secondary | ICD-10-CM

## 2023-11-12 DIAGNOSIS — E785 Hyperlipidemia, unspecified: Secondary | ICD-10-CM

## 2023-11-12 DIAGNOSIS — I2699 Other pulmonary embolism without acute cor pulmonale: Secondary | ICD-10-CM

## 2023-11-12 DIAGNOSIS — E1169 Type 2 diabetes mellitus with other specified complication: Secondary | ICD-10-CM

## 2023-11-12 DIAGNOSIS — S2243XS Multiple fractures of ribs, bilateral, sequela: Secondary | ICD-10-CM

## 2023-11-12 LAB — LIPID PANEL
Cholesterol: 180 mg/dL (ref 0–200)
HDL: 59 mg/dL (ref >39.00)
LDL Cholesterol: 100 mg/dL — ABNORMAL HIGH (ref 0–99)
NonHDL: 121.3
Total CHOL/HDL Ratio: 3
Triglycerides: 108 mg/dL (ref 0.0–149.0)
VLDL: 21.6 mg/dL (ref 0.0–40.0)

## 2023-11-12 NOTE — Patient Instructions (Signed)
Go to lab Maintain current med doses 

## 2023-11-12 NOTE — Assessment & Plan Note (Addendum)
No SOB, GI/GU bleed, or hematoma Repeat CTA chest

## 2023-11-12 NOTE — Assessment & Plan Note (Signed)
Repeat lipid panel Maintain atorvastatin dose 

## 2023-11-12 NOTE — Assessment & Plan Note (Signed)
Has completed 4sessions of OT. She has sessions. Advised to return to work after completion of OT sessions, with restriction-avoid lifting/pushing >20lbs for an additional 1months. No driving restriction

## 2023-11-12 NOTE — Progress Notes (Signed)
Established Patient Visit  Patient: [name]   DOB: 02/02/1961   62 y.o. Female  MRN: [id number] Visit Date: 11/12/2023  Subjective:    Chief Complaint  Patient presents with   OFFICE VISIT    6 month follow up. PT is due for eye exam    Hyperlipidemia   Hypertension   HPI Multiple rib fractures Has completed 4sessions of OT. She has sessions. Advised to return to work after completion of OT sessions, with restriction-avoid lifting/pushing >20lbs for an additional 1months. No driving restriction  Hyperlipidemia associated with type 2 diabetes mellitus (HCC) Repeat lipid panel Maintain atorvastatin dose  Essential hypertension Elevated BP today due to lack of med dose this AM. States she is unable to afford Home BP machine Asymptomatic BP Readings from Last 3 Encounters:  11/12/23 (!) 150/90  10/16/23 (!) 140/80  09/17/23 134/80    Maintain med dose losartan 50mg  BID F/up in 1-94months  Right pulmonary embolus (HCC) No SOB, GI/GU bleed, or hematoma Repeat CTA chest   BP Readings from Last 3 Encounters:  11/12/23 (!) 150/90  10/16/23 (!) 140/80  09/17/23 134/80    Reviewed medical, surgical, and social history today  Medications: Outpatient Medications Prior to Visit  Medication Sig   acetaminophen (TYLENOL) 325 MG suppository Place 325 mg rectally every 4 (four) hours as needed.   apixaban (ELIQUIS) 5 MG TABS tablet Take 5 mg by mouth 2 (two) times daily.   atorvastatin (LIPITOR) 20 MG tablet Take 1 tablet (20 mg total) by mouth at bedtime.   azelastine (ASTELIN) 0.1 % nasal spray Place 2 sprays into both nostrils 2 (two) times daily. Use in each nostril as directed   BLACK COHOSH EXTRACT PO Take 1 tablet by mouth daily.   cholecalciferol (VITAMIN D3) 25 MCG (1000 UNIT) tablet Take 1,000 Units by mouth daily.   Continuous Glucose Sensor (DEXCOM G7 SENSOR) MISC 3 each by Does not apply route every 30 (thirty) days. Apply 1 sensor every  10 days   Echinacea-Goldenseal (ECHINACEA COMB/GOLDEN SEAL PO) Take 1 capsule by mouth daily as needed (Allergies).   empagliflozin (JARDIANCE) 25 MG TABS tablet Take 1 tablet (25 mg total) by mouth daily.   fluocinonide cream (LIDEX) 0.05 % Apply 1 application topically 2 (two) times daily as needed (Itching).   fluticasone (FLONASE) 50 MCG/ACT nasal spray Place 2 sprays into both nostrils daily.   furosemide (LASIX) 20 MG tablet Take 1 tablet (20 mg total) by mouth daily as needed for edema.   insulin degludec (TRESIBA FLEXTOUCH) 200 UNIT/ML FlexTouch Pen Inject 60 Units into the skin daily.   Insulin Pen Needle 32G X 4 MM MISC Use 4x a day   levocetirizine (XYZAL) 5 MG tablet Take 1 tablet (5 mg total) by mouth every evening.   losartan (COZAAR) 50 MG tablet Take 1 tablet (50 mg total) by mouth in the morning and at bedtime.   metFORMIN (GLUCOPHAGE-XR) 500 MG 24 hr tablet Take 2 tablets (1,000 mg total) by mouth daily with breakfast.   methocarbamol (ROBAXIN) 750 MG tablet Take 750 mg by mouth.   nitroGLYCERIN (NITRODUR - DOSED IN MG/24 HR) 0.2 mg/hr patch Place 0.2 mg onto the skin daily. Apply 1/4 patch daily as needed for tondonitis   omeprazole (PRILOSEC) 20 MG capsule Take 1 capsule (20 mg total) by mouth daily.   oxyCODONE (OXY IR/ROXICODONE) 5 MG immediate release tablet Take  5 mg by mouth every 4 (four) hours as needed.   Semaglutide, 1 MG/DOSE, (OZEMPIC, 1 MG/DOSE,) 4 MG/3ML SOPN Inject 1 mg into the skin once a week.   vitamin B-12 (CYANOCOBALAMIN) 100 MCG tablet Take 100 mcg by mouth daily.   vitamin C (ASCORBIC ACID) 500 MG tablet Take 1,000 mg by mouth daily.   vitamin E 400 UNIT capsule Take 400 Units by mouth daily. Reported on 02/13/2016   [DISCONTINUED] Continuous Glucose Sensor (FREESTYLE LIBRE 3 SENSOR) MISC 1 each by Does not apply route every 14 (fourteen) days.   [DISCONTINUED] glucose blood (CONTOUR NEXT TEST) test strip Use as instructed to check 2X daily   No  facility-administered medications prior to visit.   Reviewed past medical and social history.   ROS per HPI above      Objective:  BP (!) 150/90   Pulse 81   Temp 98 F (36.7 C) (Temporal)   Resp 18   Ht 5\' 6"  (1.676 m)   Wt 190 lb 6.4 oz (86.4 kg)   SpO2 100%   BMI 30.73 kg/m      Physical Exam Vitals and nursing note reviewed.  Cardiovascular:     Rate and Rhythm: Normal rate.     Pulses: Normal pulses.  Pulmonary:     Effort: Pulmonary effort is normal.  Musculoskeletal:     Right lower leg: No edema.     Left lower leg: No edema.  Neurological:     Mental Status: She is oriented to person, place, and time.     No results found for any visits on 11/12/23.    Assessment & Plan:    Problem List Items Addressed This Visit     Essential hypertension - Primary    Elevated BP today due to lack of med dose this AM. States she is unable to afford Home BP machine Asymptomatic BP Readings from Last 3 Encounters:  11/12/23 (!) 150/90  10/16/23 (!) 140/80  09/17/23 134/80    Maintain med dose losartan 50mg  BID F/up in 1-66months      Hyperlipidemia associated with type 2 diabetes mellitus (HCC)    Repeat lipid panel Maintain atorvastatin dose      Relevant Orders   Lipid panel   Multiple rib fractures    Has completed 4sessions of OT. She has sessions. Advised to return to work after completion of OT sessions, with restriction-avoid lifting/pushing >20lbs for an additional 1months. No driving restriction      Right pulmonary embolus (HCC)    No SOB, GI/GU bleed, or hematoma Repeat CTA chest       Relevant Orders   CT Angio Chest Pulmonary Embolism (PE) W or WO Contrast   Return in about 3 months (around 02/10/2024) for HTN, hyperlipidemia (fasting).     Alysia Penna, NP

## 2023-11-12 NOTE — Assessment & Plan Note (Addendum)
Elevated BP today due to lack of med dose this AM. States she is unable to afford Home BP machine Asymptomatic BP Readings from Last 3 Encounters:  11/12/23 (!) 150/90  10/16/23 (!) 140/80  09/17/23 134/80    Maintain med dose losartan 50mg  BID F/up in 1-39months

## 2023-11-13 ENCOUNTER — Ambulatory Visit: Payer: Medicaid Other | Attending: Surgery | Admitting: Occupational Therapy

## 2023-11-13 ENCOUNTER — Encounter: Payer: Self-pay | Admitting: Occupational Therapy

## 2023-11-13 DIAGNOSIS — M6281 Muscle weakness (generalized): Secondary | ICD-10-CM | POA: Diagnosis not present

## 2023-11-13 DIAGNOSIS — M25612 Stiffness of left shoulder, not elsewhere classified: Secondary | ICD-10-CM | POA: Insufficient documentation

## 2023-11-13 DIAGNOSIS — M25611 Stiffness of right shoulder, not elsewhere classified: Secondary | ICD-10-CM | POA: Diagnosis not present
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
## 2023-11-14 ENCOUNTER — Encounter: Payer: Medicaid Other | Admitting: Occupational Therapy

## 2023-11-18 ENCOUNTER — Ambulatory Visit: Payer: Medicaid Other | Admitting: Occupational Therapy

## 2023-11-18 DIAGNOSIS — M6281 Muscle weakness (generalized): Secondary | ICD-10-CM

## 2023-11-18 DIAGNOSIS — M25612 Stiffness of left shoulder, not elsewhere classified: Secondary | ICD-10-CM | POA: Diagnosis not present

## 2023-11-18 DIAGNOSIS — M25611 Stiffness of right shoulder, not elsewhere classified: Secondary | ICD-10-CM
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
## 2023-11-25 ENCOUNTER — Encounter: Payer: Self-pay | Admitting: Occupational Therapy

## 2023-11-25 ENCOUNTER — Ambulatory Visit: Payer: Medicaid Other | Admitting: Occupational Therapy

## 2023-11-25 DIAGNOSIS — M25611 Stiffness of right shoulder, not elsewhere classified: Secondary | ICD-10-CM

## 2023-11-25 DIAGNOSIS — M6281 Muscle weakness (generalized): Secondary | ICD-10-CM | POA: Diagnosis not present

## 2023-11-25 DIAGNOSIS — M25612 Stiffness of left shoulder, not elsewhere classified: Secondary | ICD-10-CM | POA: Diagnosis not present

## 2023-11-25 DIAGNOSIS — I10 Essential (primary) hypertension: Secondary | ICD-10-CM
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
## 2023-11-26 ENCOUNTER — Ambulatory Visit
Admission: RE | Admit: 2023-11-26 | Discharge: 2023-11-26 | Disposition: A | Discharge status: Home / Self Care | Payer: Medicaid Other | Source: Ambulatory Visit | Attending: Nurse Practitioner | Admitting: Nurse Practitioner

## 2023-11-26 DIAGNOSIS — R079 Chest pain, unspecified: Secondary | ICD-10-CM | POA: Diagnosis not present

## 2023-11-26 DIAGNOSIS — I2699 Other pulmonary embolism without acute cor pulmonale: Secondary | ICD-10-CM

## 2023-11-26 MED ORDER — IOPAMIDOL (ISOVUE-370) INJECTION 76%
200.0000 mL | Freq: Once | INTRAVENOUS | Status: AC | PRN
  Administered 2023-11-26: 75 mL via INTRAVENOUS

## 2023-11-27 MED ORDER — AMLODIPINE BESYLATE 5 MG PO TABS: 5.0000 mg | ORAL_TABLET | Freq: Every evening | ORAL | 1 refills | Status: DC

## 2023-11-27 NOTE — Assessment & Plan Note (Signed)
Home BP reading: 157/73 143/75 BP is not controlled with losartan 50mg  BID. Add amlodipine 5mg  in PM. New PRESCRIPTION sent. Call office o schedule a 39month f/up appointment for HYPERTENSION re evaluation. Continue to monitor BP at home in AM. Bring these readings to your next appointment.

## 2023-12-02 ENCOUNTER — Encounter: Payer: Self-pay | Admitting: Occupational Therapy

## 2023-12-03 ENCOUNTER — Other Ambulatory Visit: Payer: Self-pay | Admitting: Internal Medicine

## 2023-12-05 NOTE — Telephone Encounter (Signed)
Patient called back and states this is her current insurance  [employer]  [id number]  Medicaid DG:[id number] P  PA is needed for: Prudy Feeler, Franki Monte Tresiba

## 2023-12-06 ENCOUNTER — Other Ambulatory Visit (HOSPITAL_COMMUNITY): Payer: Self-pay

## 2023-12-06 NOTE — Telephone Encounter (Signed)
Message sent via MyChart and spoke with her via phone.

## 2023-12-06 NOTE — Telephone Encounter (Signed)
Per test claims and PA request- [employer]/Medicaid is stating that the patient has other primary coverage. Patient may have to call [employer]/Medicaid if this is not correct. We cannot proceed with any of the requests until the correct primary coverage is received or is corrected with the plan.

## 2023-12-09 ENCOUNTER — Encounter: Payer: Self-pay | Admitting: Occupational Therapy

## 2023-12-10 ENCOUNTER — Other Ambulatory Visit (HOSPITAL_COMMUNITY): Payer: Self-pay

## 2023-12-10 ENCOUNTER — Telehealth: Payer: Self-pay

## 2023-12-10 NOTE — Telephone Encounter (Signed)
Patient called back and left a VM stating she spoke with the insurance and the issue should be fixed she only has this one plan with [employer]. Please Proceed with PA's for: [name], Dexcom, and Tresiba

## 2023-12-10 NOTE — Telephone Encounter (Signed)
 Pharmacy Patient Advocate Encounter  Received notification from [facility] that Prior Authorization for London Pepper has been APPROVED from 11/26/23 to 12/09/2024   PA #/Case ID/Reference #: [id number]

## 2023-12-10 NOTE — Telephone Encounter (Signed)
 Pharmacy Patient Advocate Encounter   Received notification from Pt Calls Messages that prior authorization for Jardiance  is required/requested.   Insurance verification completed.   The patient is insured through [facility] .   Per test claim: PA required; PA submitted to above mentioned insurance via CoverMyMeds Key/confirmation #/EOC [id number] Status is pending

## 2023-12-10 NOTE — Telephone Encounter (Signed)
 Pharmacy Patient Advocate Encounter   Received notification from Pt Calls Messages that prior authorization for Jardiance  is required/requested.   Insurance verification completed.   The patient is insured through [facility] .   Per test claim:  The below medication is preferred by the insurance.  If suggested medication is appropriate, Please send in a new RX and discontinue this one. If not, please advise as to why it's not appropriate so that we may request a Prior Authorization. Please note, some preferred medications may still require a PA     Please advise

## 2023-12-10 NOTE — Telephone Encounter (Signed)
 Pharmacy Patient Advocate Encounter   Received notification from Pt Calls Messages that prior authorization for Ozempic  is required/requested.   Insurance verification completed.   The patient is insured through [facility] .   Per test claim: PA required; PA submitted to above mentioned insurance via CoverMyMeds Key/confirmation #/EOC [id number] Status is pending

## 2023-12-10 NOTE — Telephone Encounter (Signed)
 Pharmacy Patient Advocate Encounter  Received notification from [facility] that Prior Authorization for Dexcom G7 sensor has been APPROVED from 11/26/23 to 06/07/2024   PA #/Case ID/Reference #: [id number]

## 2023-12-10 NOTE — Telephone Encounter (Signed)
 Pharmacy Patient Advocate Encounter  Received notification from [facility] that Prior Authorization for Ozempic has been APPROVED from 11/26/2023 to 12/09/2024

## 2023-12-10 NOTE — Telephone Encounter (Signed)
 Pharmacy Patient Advocate Encounter   Received notification from Pt Calls Messages that prior authorization for Dexcom G7 sensor is required/requested.   Insurance verification completed.   The patient is insured through [facility] .   Per test claim: PA required; PA submitted to above mentioned insurance via CoverMyMeds Key/confirmation #/EOC [facility] Status is pending

## 2023-12-11 DIAGNOSIS — Z419 Encounter for procedure for purposes other than remedying health state, unspecified: Secondary | ICD-10-CM | POA: Diagnosis not present

## 2023-12-12 ENCOUNTER — Other Ambulatory Visit: Payer: Self-pay

## 2023-12-12 DIAGNOSIS — E1159 Type 2 diabetes mellitus with other circulatory complications: Secondary | ICD-10-CM

## 2023-12-12 MED ORDER — FREESTYLE LIBRE 3 PLUS SENSOR MISC: 1.0000 | 3 refills | Status: DC

## 2023-12-12 NOTE — Telephone Encounter (Signed)
 We can change to Toujeo - same dose. Ty! C

## 2023-12-12 NOTE — Telephone Encounter (Signed)
 Just to Clarify this message is for the tresiba  correct?

## 2023-12-12 NOTE — Telephone Encounter (Signed)
 Freestyle Libre 3 plus sensors ordered in place of Dexcom G7 as directed by MD

## 2023-12-13 ENCOUNTER — Telehealth: Payer: Self-pay

## 2023-12-13 ENCOUNTER — Ambulatory Visit
Admission: RE | Admit: 2023-12-13 | Discharge: 2023-12-13 | Disposition: A | Discharge status: Home / Self Care | Payer: Medicaid Other | Source: Ambulatory Visit | Attending: Family Medicine | Admitting: Family Medicine

## 2023-12-13 ENCOUNTER — Ambulatory Visit: Payer: Self-pay | Admitting: Nurse Practitioner

## 2023-12-13 ENCOUNTER — Other Ambulatory Visit: Payer: Self-pay | Admitting: Internal Medicine

## 2023-12-13 VITALS — BP 115/81 | HR 70 | Temp 98.7°F | Resp 18 | Ht 66.0 in | Wt 193.0 lb

## 2023-12-13 DIAGNOSIS — M940 Chondrocostal junction syndrome [Tietze]: Secondary | ICD-10-CM

## 2023-12-13 DIAGNOSIS — J209 Acute bronchitis, unspecified: Secondary | ICD-10-CM

## 2023-12-13 MED ORDER — DOXYCYCLINE HYCLATE 100 MG PO CAPS: ORAL_CAPSULE | ORAL | 0 refills | Status: DC

## 2023-12-13 MED ORDER — TOUJEO MAX SOLOSTAR 300 UNIT/ML ~~LOC~~ SOPN: 60.0000 [IU] | PEN_INJECTOR | Freq: Every day | SUBCUTANEOUS | 3 refills | Status: DC
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
## 2023-12-13 NOTE — Telephone Encounter (Signed)
 Pt has been notified, via phone call.   [name]

## 2023-12-13 NOTE — ED Triage Notes (Signed)
 Patient c/o non-productive cough, sneezing, congestion, some nasal drainage x 1 week.  Patient has taken Vicks Day and Night time, Mucinex and Tylenol.

## 2023-12-13 NOTE — Addendum Note (Signed)
 Addended by: Pollie Meyer on: 12/13/2023 01:55 PM   Modules accepted: Orders

## 2023-12-13 NOTE — Telephone Encounter (Signed)
 Requested Prescriptions   Signed Prescriptions Disp Refills   insulin  glargine, 2 Unit Dial , (TOUJEO  MAX SOLOSTAR) 300 UNIT/ML Solostar Pen 15 mL 3    Sig: Inject 60 Units into the skin daily.    Authorizing Provider: TRIXIE FILE    Ordering User: CLEOTILDE ROLIN RAMAN

## 2023-12-13 NOTE — Discharge Instructions (Signed)
 Take plain guaifenesin (1200mg  extended release tabs such as Mucinex) twice daily, with plenty of water, for cough and congestion.  Get adequate rest.   May use Afrin nasal spray (or generic oxymetazoline) each morning for about 5 days and then discontinue.  Also recommend using saline nasal spray several times daily and saline nasal irrigation (AYR is a common brand).  Use Flonase  nasal spray each morning after using Afrin nasal spray and saline nasal irrigation. May take Delsym Cough Suppressant (12 Hour Cough Relief) at bedtime for nighttime cough.  Stop all antihistamines for now, and other non-prescription cough/cold preparations. May take Tylenol  for chest/sternum discomfort.   If symptoms become significantly worse during the night or over the weekend, proceed to the local emergency room.

## 2023-12-13 NOTE — Telephone Encounter (Signed)
 Patient called to have rx resent, this was performed. Called patient and left discreet voicemail.

## 2023-12-16 ENCOUNTER — Ambulatory Visit: Payer: Medicaid Other | Admitting: Occupational Therapy

## 2023-12-19 ENCOUNTER — Ambulatory Visit: Payer: Medicaid Other | Attending: Nurse Practitioner | Admitting: Occupational Therapy

## 2023-12-19 ENCOUNTER — Encounter: Payer: Self-pay | Admitting: Occupational Therapy

## 2023-12-19 DIAGNOSIS — M25611 Stiffness of right shoulder, not elsewhere classified: Secondary | ICD-10-CM | POA: Diagnosis not present

## 2023-12-19 DIAGNOSIS — M6281 Muscle weakness (generalized): Secondary | ICD-10-CM | POA: Insufficient documentation

## 2023-12-19 DIAGNOSIS — M25612 Stiffness of left shoulder, not elsewhere classified: Secondary | ICD-10-CM | POA: Diagnosis not present
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
## 2023-12-19 NOTE — Patient Instructions (Signed)
   ROM: Abduction - Wand   Holding wand with left hand palm up, push wand directly out to side, leading with other hand palm down, Hold 5 seconds. Pain free ROM only Repeat 10 times per set. Do 1-2 sessions per day. (Lying down)           Active Assistive Shoulder External Rotation    SIT with stick at waist level, palm ups, other palm down. Step to side, push forearm out from body with hand palm down, and keep elbows bent. Hold. Side step and return to start position. Perform 10 reps. 2X DAY   Cane Overhead - Standing   With arms straight, hold cane forward at waist stop prior to pain at grossly 90*. Raise cane to Hold 3 seconds. Repeat 10 times. Do 2 times per day.  Copyright  VHI. All rights reserved.   http://orth.exer.us /744   Copyright  VHI. All rights reserved.

## 2023-12-20 ENCOUNTER — Ambulatory Visit (INDEPENDENT_AMBULATORY_CARE_PROVIDER_SITE_OTHER): Payer: Medicaid Other | Admitting: Internal Medicine

## 2023-12-20 ENCOUNTER — Encounter: Payer: Self-pay | Admitting: Internal Medicine

## 2023-12-20 VITALS — BP 128/70 | HR 89 | Ht 66.0 in | Wt 189.4 lb

## 2023-12-20 DIAGNOSIS — E1169 Type 2 diabetes mellitus with other specified complication: Secondary | ICD-10-CM

## 2023-12-20 DIAGNOSIS — E1165 Type 2 diabetes mellitus with hyperglycemia: Secondary | ICD-10-CM | POA: Diagnosis not present

## 2023-12-20 DIAGNOSIS — Z7985 Long-term (current) use of injectable non-insulin antidiabetic drugs: Secondary | ICD-10-CM

## 2023-12-20 DIAGNOSIS — E1159 Type 2 diabetes mellitus with other circulatory complications: Secondary | ICD-10-CM | POA: Diagnosis not present

## 2023-12-20 DIAGNOSIS — Z7984 Long term (current) use of oral hypoglycemic drugs: Secondary | ICD-10-CM | POA: Diagnosis not present

## 2023-12-20 DIAGNOSIS — E785 Hyperlipidemia, unspecified: Secondary | ICD-10-CM

## 2023-12-20 DIAGNOSIS — Z794 Long term (current) use of insulin: Secondary | ICD-10-CM | POA: Diagnosis not present

## 2023-12-20 LAB — POCT GLYCOSYLATED HEMOGLOBIN (HGB A1C): Hemoglobin A1C: 6.9 % — AB (ref 4.0–5.6)

## 2023-12-20 NOTE — Patient Instructions (Addendum)
 Please continue: - Metformin  ER 1000 mg with breakfast - Jardiance  25 mg before breakfast  Decrease: - Toujeo  42 units daily   Start: - Ozempic  0.5 mg (36 clicks) weekly for 2 doses, then increase to 1 mg weekly  Please return for another visit in 4 months.
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
## 2023-12-23 ENCOUNTER — Ambulatory Visit: Payer: Medicaid Other | Admitting: Cardiovascular Disease

## 2023-12-24 ENCOUNTER — Other Ambulatory Visit (HOSPITAL_COMMUNITY): Payer: Self-pay

## 2023-12-24 ENCOUNTER — Telehealth: Payer: Self-pay

## 2023-12-24 NOTE — Telephone Encounter (Signed)
 Pharmacy Patient Advocate Encounter   Received notification from Pt Calls Messages that prior authorization for Toujeo  is required/requested.   Insurance verification completed.   The patient is insured through [facility] .   Per test claim: PA required; PA submitted to above mentioned insurance via CoverMyMeds Key/confirmation #/EOC [id number] Status is pending

## 2023-12-24 NOTE — Telephone Encounter (Signed)
 Pt called and left a vm stating she needs a PA for [employer]

## 2023-12-26 ENCOUNTER — Ambulatory Visit: Payer: Medicaid Other | Admitting: Occupational Therapy

## 2023-12-26 ENCOUNTER — Encounter: Payer: Self-pay | Admitting: Occupational Therapy

## 2023-12-26 DIAGNOSIS — M25612 Stiffness of left shoulder, not elsewhere classified: Secondary | ICD-10-CM | POA: Diagnosis not present

## 2023-12-26 DIAGNOSIS — M25611 Stiffness of right shoulder, not elsewhere classified: Secondary | ICD-10-CM | POA: Diagnosis not present

## 2023-12-26 DIAGNOSIS — M6281 Muscle weakness (generalized): Secondary | ICD-10-CM | POA: Diagnosis not present
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
## 2023-12-30 ENCOUNTER — Ambulatory Visit: Payer: Medicaid Other | Admitting: Occupational Therapy

## 2023-12-30 ENCOUNTER — Encounter: Payer: Self-pay | Admitting: Occupational Therapy

## 2023-12-30 DIAGNOSIS — M25611 Stiffness of right shoulder, not elsewhere classified: Secondary | ICD-10-CM

## 2023-12-30 DIAGNOSIS — M6281 Muscle weakness (generalized): Secondary | ICD-10-CM | POA: Diagnosis not present

## 2023-12-30 DIAGNOSIS — M25612 Stiffness of left shoulder, not elsewhere classified: Secondary | ICD-10-CM
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
## 2023-12-31 NOTE — Telephone Encounter (Signed)
J, Let;s appeal -she is taking a relative high dose of insulin glargine, 42 units daily.  This is best administered at a higher concentration, for example use 300, as in Toujeo.

## 2023-12-31 NOTE — Telephone Encounter (Signed)
Pharmacy Patient Advocate Encounter  Received notification from [facility] that Prior Authorization for [name] has been DENIED.  Full denial letter will be uploaded to the media tab. See denial reason below.   Per the health plan's preferred drug list, at least 1 preferred drugs must be tried before requesting this drug or tell us why the member cannot try any preferred alternatives. Please send [place] supporting chart notes and lab results. Here is list of preferred alternatives: insulin glargine vial/SoloStar (authorized biologic for Lantus), Lantus SoloStar/vial, Levemir/FlexPen/FlexTouch/vial.  PA #/Case ID/Reference #: [id number]

## 2024-01-02 ENCOUNTER — Telehealth: Payer: Self-pay | Admitting: Pharmacist

## 2024-01-02 NOTE — Telephone Encounter (Signed)
Appeal has been submitted for Toujeo Max SoloStar 300UNIT/ML pen-injectors. Will advise when response is received, please be advised that most companies may take 30 days to make a decision.  Appeal letter and supporting documentation have been faxed to [phone] on 01/02/2024 @8 :50 am.  Thank you, Dellie Burns, PharmD Clinical Pharmacist  [place]  Direct Dial: [phone]

## 2024-01-03 NOTE — Telephone Encounter (Signed)
Pharmacy Patient Advocate Encounter  Received notification from [facility] that Prior Authorization for toujeo has been APPROVED through 01/01/2025

## 2024-01-08 ENCOUNTER — Ambulatory Visit: Payer: Medicaid Other | Admitting: Occupational Therapy

## 2024-01-11 DIAGNOSIS — Z419 Encounter for procedure for purposes other than remedying health state, unspecified: Secondary | ICD-10-CM | POA: Diagnosis not present

## 2024-01-13 ENCOUNTER — Telehealth: Payer: Self-pay | Admitting: Nurse Practitioner

## 2024-01-13 NOTE — Telephone Encounter (Signed)
   CLINICAL USE BELOW THIS LINE (use X to signify action taken)  _X__ Form received and placed in providers office for signature. ___ Form completed and faxed to LOA Dept.  ___ Form completed & LVM to notify patient ready for pick up.  ___ Charge sheet and copy of form in front office folder for office supervisor.

## 2024-01-13 NOTE — Telephone Encounter (Signed)
Called patient to schedule office visit for HTN; no answer, left VM for call back to schedule.

## 2024-01-13 NOTE — Telephone Encounter (Signed)
Patient dropped off document Insurance Form, to be filled out by provider. Patient requested to send it back via Call Patient to pick up within 7-days. Document is located in providers tray at front office.Please advise at Mobile [phone] (mobile)   Pt walked in to drop off paperwork. I put in the dr box

## 2024-01-16 ENCOUNTER — Ambulatory Visit: Payer: Medicaid Other | Attending: Nurse Practitioner | Admitting: Occupational Therapy

## 2024-01-16 ENCOUNTER — Encounter: Payer: Self-pay | Admitting: Nurse Practitioner

## 2024-01-16 ENCOUNTER — Encounter: Payer: Self-pay | Admitting: Cardiovascular Disease

## 2024-01-16 ENCOUNTER — Ambulatory Visit: Payer: Medicaid Other | Attending: Cardiovascular Disease | Admitting: Cardiovascular Disease

## 2024-01-16 ENCOUNTER — Ambulatory Visit (INDEPENDENT_AMBULATORY_CARE_PROVIDER_SITE_OTHER): Payer: Medicaid Other | Admitting: Nurse Practitioner

## 2024-01-16 VITALS — BP 120/80 | HR 62 | Ht 66.0 in | Wt 194.8 lb

## 2024-01-16 VITALS — BP 132/70 | HR 81 | Temp 98.5°F | Ht 66.0 in | Wt 197.2 lb

## 2024-01-16 DIAGNOSIS — S2221XS Fracture of manubrium, sequela: Secondary | ICD-10-CM | POA: Diagnosis not present

## 2024-01-16 DIAGNOSIS — M6281 Muscle weakness (generalized): Secondary | ICD-10-CM | POA: Insufficient documentation

## 2024-01-16 DIAGNOSIS — I2699 Other pulmonary embolism without acute cor pulmonale: Secondary | ICD-10-CM | POA: Diagnosis not present

## 2024-01-16 DIAGNOSIS — E785 Hyperlipidemia, unspecified: Secondary | ICD-10-CM

## 2024-01-16 DIAGNOSIS — I10 Essential (primary) hypertension: Secondary | ICD-10-CM

## 2024-01-16 DIAGNOSIS — S2243XS Multiple fractures of ribs, bilateral, sequela: Secondary | ICD-10-CM

## 2024-01-16 DIAGNOSIS — M25611 Stiffness of right shoulder, not elsewhere classified: Secondary | ICD-10-CM | POA: Insufficient documentation

## 2024-01-16 DIAGNOSIS — G4733 Obstructive sleep apnea (adult) (pediatric): Secondary | ICD-10-CM

## 2024-01-16 DIAGNOSIS — E66811 Obesity, class 1: Secondary | ICD-10-CM

## 2024-01-16 DIAGNOSIS — E1169 Type 2 diabetes mellitus with other specified complication: Secondary | ICD-10-CM | POA: Diagnosis not present

## 2024-01-16 DIAGNOSIS — Z794 Long term (current) use of insulin: Secondary | ICD-10-CM | POA: Diagnosis not present

## 2024-01-16 DIAGNOSIS — M25612 Stiffness of left shoulder, not elsewhere classified: Secondary | ICD-10-CM | POA: Insufficient documentation

## 2024-01-16 MED ORDER — LOSARTAN POTASSIUM 50 MG PO TABS: 50.0000 mg | ORAL_TABLET | Freq: Two times a day (BID) | ORAL | 3 refills | Status: DC

## 2024-01-16 NOTE — Patient Instructions (Signed)
 Maintain current medications Cancel March appointment FMLA form will be completed

## 2024-01-16 NOTE — Progress Notes (Signed)
 Established Patient Visit  Patient: [name]   DOB: [birth year]   63 y.o. Female  MRN: [id number] Visit Date: 01/16/2024  Subjective:    Chief Complaint  Patient presents with   Hypertension    Follow up, no concerns   Essential hypertension BP at goal with losartan , and amlodipine  BP Readings from Last 3 Encounters:  01/16/24 132/70  01/16/24 120/80  12/20/23 128/70    Maintain med doses  Right pulmonary embolus (HCC) Resolved PE per CT completed 11/26/2023 Eliquis discontinued  Closed fracture of manubrium CT chest 11/2023:Healing/partially healed fractures of the distal, left second, third, fourth, fifth and sixth ribs. Deformity of the right-sided the manubrium likely relates to prior fracture.  Has ongoing OT sessions for additional 6weeks FMLA form completed  Multiple rib fractures CT chest 11/2023:Healing/partially healed fractures of the distal, left second, third, fourth, fifth and sixth ribs. Deformity of the right-sided the manubrium likely relates to prior fracture.  Has ongoing OT sessions for additional 6weeks FMLA form completed  BP Readings from Last 3 Encounters:  01/16/24 132/70  01/16/24 120/80  12/20/23 128/70    Reviewed medical, surgical, and social history today  Medications: Outpatient Medications Prior to Visit  Medication Sig   amLODipine  (NORVASC ) 5 MG tablet Take 1 tablet (5 mg total) by mouth every evening.   atorvastatin  (LIPITOR) 20 MG tablet Take 1 tablet (20 mg total) by mouth at bedtime.   azelastine  (ASTELIN ) 0.1 % nasal spray Place 2 sprays into both nostrils 2 (two) times daily. Use in each nostril as directed   BLACK COHOSH EXTRACT PO Take 1 tablet by mouth daily.   cholecalciferol (VITAMIN D3) 25 MCG (1000 UNIT) tablet Take 1,000 Units by mouth daily.   Continuous Glucose Sensor (DEXCOM G7 SENSOR) MISC 3 each by Does not apply route every 30 (thirty) days. Apply 1 sensor every 10 days   Continuous Glucose  Sensor (FREESTYLE LIBRE 3 PLUS SENSOR) MISC Inject 1 Device into the skin continuous. Change every 15 days   Echinacea-Goldenseal (ECHINACEA COMB/GOLDEN SEAL PO) Take 1 capsule by mouth daily as needed (Allergies).   empagliflozin  (JARDIANCE ) 25 MG TABS tablet Take 1 tablet (25 mg total) by mouth daily.   fluticasone  (FLONASE ) 50 MCG/ACT nasal spray Place 2 sprays into both nostrils daily.   furosemide  (LASIX ) 20 MG tablet Take 1 tablet (20 mg total) by mouth daily as needed for edema.   insulin  glargine, 2 Unit Dial , (TOUJEO  MAX SOLOSTAR) 300 UNIT/ML Solostar Pen Inject 60 Units into the skin daily.   Insulin  Pen Needle 32G X 4 MM MISC Use 4x a day   levocetirizine (XYZAL ) 5 MG tablet Take 1 tablet (5 mg total) by mouth every evening.   meloxicam  (MOBIC ) 15 MG tablet Take 15 mg by mouth daily.   metFORMIN  (GLUCOPHAGE -XR) 500 MG 24 hr tablet Take 2 tablets (1,000 mg total) by mouth daily with breakfast.   methocarbamol  (ROBAXIN ) 750 MG tablet Take 750 mg by mouth 4 (four) times daily.   nitroGLYCERIN  (NITRODUR - DOSED IN MG/24 HR) 0.2 mg/hr patch Place 0.2 mg onto the skin daily. Apply 1/4 patch daily as needed for tondonitis   omeprazole  (PRILOSEC) 20 MG capsule Take 1 capsule (20 mg total) by mouth daily.   Semaglutide , 1 MG/DOSE, (OZEMPIC , 1 MG/DOSE,) 4 MG/3ML SOPN Inject 1 mg into the skin once a week.   vitamin B-12 (CYANOCOBALAMIN ) 100 MCG tablet Take  100 mcg by mouth daily.   vitamin C  (ASCORBIC ACID ) 500 MG tablet Take 1,000 mg by mouth daily.   vitamin E  400 UNIT capsule Take 400 Units by mouth daily. Reported on 02/13/2016   [DISCONTINUED] acetaminophen  (TYLENOL ) 325 MG suppository Place 325 mg rectally every 4 (four) hours as needed. (Patient not taking: Reported on 01/16/2024)   [DISCONTINUED] apixaban (ELIQUIS) 5 MG TABS tablet Take 5 mg by mouth 2 (two) times daily. (Patient not taking: Reported on 01/16/2024)   [DISCONTINUED] doxycycline  (VIBRAMYCIN ) 100 MG capsule Take one cap PO Q12hr  with food. (Patient not taking: Reported on 01/16/2024)   [DISCONTINUED] fluocinonide cream (LIDEX) 0.05 % Apply 1 application topically 2 (two) times daily as needed (Itching). (Patient not taking: Reported on 01/16/2024)   [DISCONTINUED] losartan  (COZAAR ) 50 MG tablet Take 1 tablet (50 mg total) by mouth in the morning and at bedtime.   [DISCONTINUED] oxyCODONE  (OXY IR/ROXICODONE ) 5 MG immediate release tablet Take 5 mg by mouth every 4 (four) hours as needed. (Patient not taking: Reported on 01/16/2024)   No facility-administered medications prior to visit.   Reviewed past medical and social history.   ROS per HPI above      Objective:  BP 132/70 (BP Location: Right Arm, Patient Position: Sitting, Cuff Size: Normal)   Pulse 81   Temp 98.5 F (36.9 C)   Ht 5' 6 (1.676 m)   Wt 197 lb 3.2 oz (89.4 kg)   SpO2 97%   BMI 31.83 kg/m      Physical Exam Vitals and nursing note reviewed.  Cardiovascular:     Rate and Rhythm: Normal rate.     Pulses: Normal pulses.  Pulmonary:     Effort: Pulmonary effort is normal.  Musculoskeletal:     Cervical back: Normal range of motion and neck supple.  Lymphadenopathy:     Cervical: No cervical adenopathy.  Neurological:     Mental Status: She is alert and oriented to person, place, and time.  Psychiatric:        Mood and Affect: Mood normal.        Behavior: Behavior normal.        Thought Content: Thought content normal.     No results found for any visits on 01/16/24.    Assessment & Plan:    Problem List Items Addressed This Visit     Closed fracture of manubrium   CT chest 11/2023:Healing/partially healed fractures of the distal, left second, third, fourth, fifth and sixth ribs. Deformity of the right-sided the manubrium likely relates to prior fracture.  Has ongoing OT sessions for additional 6weeks FMLA form completed      Essential hypertension - Primary   BP at goal with losartan , and amlodipine  BP Readings from Last 3  Encounters:  01/16/24 132/70  01/16/24 120/80  12/20/23 128/70    Maintain med doses      Relevant Medications   losartan  (COZAAR ) 50 MG tablet   Multiple rib fractures   CT chest 11/2023:Healing/partially healed fractures of the distal, left second, third, fourth, fifth and sixth ribs. Deformity of the right-sided the manubrium likely relates to prior fracture.  Has ongoing OT sessions for additional 6weeks FMLA form completed      RESOLVED: Right pulmonary embolus (HCC)   Resolved PE per CT completed 11/26/2023 Eliquis discontinued      Relevant Medications   losartan  (COZAAR ) 50 MG tablet   Return in about 3 months (around 04/14/2024) for HTN, hyperlipidemia (fasting).  Roselie Mood, NP
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
## 2024-01-16 NOTE — Assessment & Plan Note (Deleted)
 CT chest 11/2023:Healing/partially healed fractures of the distal, left second, third, fourth, fifth and sixth ribs. Deformity of the right-sided the manubrium likely relates to prior fracture.

## 2024-01-16 NOTE — Assessment & Plan Note (Addendum)
 CT chest 11/2023:Healing/partially healed fractures of the distal, left second, third, fourth, fifth and sixth ribs. Deformity of the right-sided the manubrium likely relates to prior fracture.  Has ongoing OT sessions for additional 6weeks FMLA form completed

## 2024-01-16 NOTE — Assessment & Plan Note (Signed)
 BP at goal with losartan , and amlodipine  BP Readings from Last 3 Encounters:  01/16/24 132/70  01/16/24 120/80  12/20/23 128/70    Maintain med doses

## 2024-01-16 NOTE — Assessment & Plan Note (Signed)
 Resolved PE per CT completed 11/26/2023 Eliquis discontinued

## 2024-01-16 NOTE — Progress Notes (Signed)
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 to provide you with the Wallace you need, when you need it.  We recommend signing up for the patient portal called MyChart.  Sign up information is provided on this After Visit Summary.  MyChart is used to connect with patients for Virtual Visits (Telemedicine).  Patients are able to view lab/test results, encounter notes, upcoming appointments, etc.  Non-urgent messages can be sent to your provider as well.   To  learn more about what you can do with MyChart, go to [email or web address].    Your next appointment:   3 month(s)  Provider:   Dr. Debby Wallace    Other Instructions Thank you for choosing [facility]!       Signed, Sharon Sor, MD, [facility], ABSM Diplomate, American Board of Sleep Medicine  01/18/2024 1:42 PM    [facility] [address], [address], [place], [place]  [address] Phone: [phone]

## 2024-01-16 NOTE — Patient Instructions (Addendum)
 Medication Instructions:  No medication changes were made during today's visit  *If you need a refill on your cardiac medications before your next appointment, please call your pharmacy*   Lab Work: No labs were ordered during today's visit.  If you have labs (blood work) drawn today and your tests are completely normal, you will receive your results only by: MyChart Message (if you have MyChart) OR A paper copy in the mail If you have any lab test that is abnormal or we need to change your treatment, we will call you to review the results.   Testing/Procedures: Your physician has recommended that you have a sleep study. This test records several body functions during sleep, including: brain activity, eye movement, oxygen and carbon dioxide blood levels, heart rate and rhythm, breathing rate and rhythm, the flow of air through your mouth and nose, snoring, body muscle movements, and chest and belly movement.    Follow-Up: At [facility], you and your health needs are our priority.  As part of our continuing mission to provide you with exceptional heart care, we have created designated Provider Care Teams.  These Care Teams include your primary Cardiologist (physician) and Advanced Practice Providers (APPs -  Physician Assistants and Nurse Practitioners) who all work together to provide you with the care you need, when you need it.  We recommend signing up for the patient portal called MyChart.  Sign up information is provided on this After Visit Summary.  MyChart is used to connect with patients for Virtual Visits (Telemedicine).  Patients are able to view lab/test results, encounter notes, upcoming appointments, etc.  Non-urgent messages can be sent to your provider as well.   To learn more about what you can do with MyChart, go to [email or web address].    Your next appointment:   3 month(s)  Provider:   Dr. Debby Sor    Other Instructions Thank you for choosing [facility]!

## 2024-01-17 NOTE — Telephone Encounter (Signed)
  CLINICAL USE BELOW THIS LINE (use X to signify action taken)   ___ Form received and placed in providers office for signature. _x__ Form completed and faxed to LOA Dept.  _x__ Form completed & LVM to notify patient ready for pick up.  __x_ Charge sheet and copy of form in front office folder for office supervisor.

## 2024-01-18 ENCOUNTER — Encounter: Payer: Self-pay | Admitting: Cardiovascular Disease

## 2024-01-28 ENCOUNTER — Ambulatory Visit: Payer: Medicaid Other | Admitting: Occupational Therapy

## 2024-01-28 DIAGNOSIS — H5213 Myopia, bilateral: Secondary | ICD-10-CM | POA: Diagnosis not present

## 2024-01-28 DIAGNOSIS — M25612 Stiffness of left shoulder, not elsewhere classified: Secondary | ICD-10-CM

## 2024-01-28 DIAGNOSIS — M25611 Stiffness of right shoulder, not elsewhere classified: Secondary | ICD-10-CM

## 2024-01-28 DIAGNOSIS — M6281 Muscle weakness (generalized): Secondary | ICD-10-CM
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
## 2024-02-04 ENCOUNTER — Other Ambulatory Visit: Payer: Self-pay | Admitting: Internal Medicine

## 2024-02-05 ENCOUNTER — Encounter: Payer: Self-pay | Admitting: Occupational Therapy

## 2024-02-08 DIAGNOSIS — Z419 Encounter for procedure for purposes other than remedying health state, unspecified: Secondary | ICD-10-CM | POA: Diagnosis not present

## 2024-02-17 ENCOUNTER — Ambulatory Visit: Payer: Medicaid Other | Admitting: Nurse Practitioner

## 2024-03-03 NOTE — Progress Notes (Signed)
 Hi Dr. Servando Salina and [name],   [name] has been identified as a patient who may benefit from health coaching for healthy eating per Adopting a Healthy Lifestyle recommendation per AVS on 09/10/23. If patient is interested please send referral to [id number] or search for Care Navigation.   Renaee Munda, MS, ERHD, [facility]  Care Guide [facility] Care Navigation Telephone: [phone] Email: [email or web address]

## 2024-03-06 ENCOUNTER — Encounter: Payer: Self-pay | Admitting: Cardiology

## 2024-03-06 ENCOUNTER — Ambulatory Visit: Payer: Medicaid Other | Attending: Cardiology | Admitting: Cardiology

## 2024-03-06 VITALS — BP 140/88 | HR 82 | Ht 66.0 in | Wt 203.2 lb

## 2024-03-06 DIAGNOSIS — I10 Essential (primary) hypertension: Secondary | ICD-10-CM | POA: Diagnosis not present

## 2024-03-06 DIAGNOSIS — I2510 Atherosclerotic heart disease of native coronary artery without angina pectoris: Secondary | ICD-10-CM | POA: Diagnosis not present

## 2024-03-06 DIAGNOSIS — R5383 Other fatigue: Secondary | ICD-10-CM | POA: Diagnosis not present

## 2024-03-06 DIAGNOSIS — E782 Mixed hyperlipidemia: Secondary | ICD-10-CM

## 2024-03-06 NOTE — Patient Instructions (Signed)
 Medication Instructions:  Your physician recommends that you continue on your current medications as directed. Please refer to the Current Medication list given to you today.  *If you need a refill on your cardiac medications before your next appointment, please call your pharmacy*  Lab Work: CBC If you have labs (blood work) drawn today and your tests are completely normal, you will receive your results only by: MyChart Message (if you have MyChart) OR A paper copy in the mail If you have any lab test that is abnormal or we need to change your treatment, we will call you to review the results.  Follow-Up: At [facility], you and your health needs are our priority.  As part of our continuing mission to provide you with exceptional heart care, our providers are all part of one team.  This team includes your primary Cardiologist (physician) and Advanced Practice Providers or APPs (Physician Assistants and Nurse Practitioners) who all work together to provide you with the care you need, when you need it.  Your next appointment:   6 month(s)  Provider:   Thomasene Ripple, DO      Other Instructions:   1st Floor: - Lobby - Registration  - Pharmacy  - Lab - Cafe  2nd Floor: - PV Lab - Diagnostic Testing (echo, CT, nuclear med)  3rd Floor: - Vacant  4th Floor: - TCTS (cardiothoracic surgery) - AFib Clinic - Structural Heart Clinic - Vascular Surgery  - Vascular Ultrasound  5th Floor: - HeartCare Cardiology (general and EP) - Clinical Pharmacy for coumadin, hypertension, lipid, weight-loss medications, and med management appointments    Valet parking services will be available as well.

## 2024-03-07 LAB — CBC
Hematocrit: 37.2 % (ref 34.0–46.6)
Hemoglobin: 12 g/dL (ref 11.1–15.9)
MCH: 28.5 pg (ref 26.6–33.0)
MCHC: 32.3 g/dL (ref 31.5–35.7)
MCV: 88 fL (ref 79–97)
Platelets: 287 10*3/uL (ref 150–450)
RBC: 4.21 x10E6/uL (ref 3.77–5.28)
RDW: 13.6 % (ref 11.7–15.4)
WBC: 4.6 10*3/uL (ref 3.4–10.8)
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
## 2024-03-21 DIAGNOSIS — Z419 Encounter for procedure for purposes other than remedying health state, unspecified: Secondary | ICD-10-CM | POA: Diagnosis not present

## 2024-03-31 ENCOUNTER — Ambulatory Visit: Payer: Medicaid Other | Admitting: Cardiovascular Disease

## 2024-03-31 ENCOUNTER — Telehealth: Payer: Self-pay | Admitting: Cardiovascular Disease

## 2024-03-31 NOTE — Telephone Encounter (Signed)
**Note De-Identified [name] Obfuscation** Split Night Sleep Study PA started through the [facility] provider portal. [id number]

## 2024-03-31 NOTE — Telephone Encounter (Signed)
 Pt calling saying she has been waiting since Feb to schedule a sleep study. Please advise

## 2024-04-02 ENCOUNTER — Other Ambulatory Visit: Payer: Self-pay | Admitting: Nurse Practitioner

## 2024-04-02 DIAGNOSIS — Z1231 Encounter for screening mammogram for malignant neoplasm of breast: Secondary | ICD-10-CM

## 2024-04-08 ENCOUNTER — Ambulatory Visit
Admission: RE | Admit: 2024-04-08 | Discharge: 2024-04-08 | Disposition: A | Discharge status: Home / Self Care | Source: Ambulatory Visit | Attending: Nurse Practitioner | Admitting: Nurse Practitioner

## 2024-04-08 DIAGNOSIS — Z1231 Encounter for screening mammogram for malignant neoplasm of breast: Secondary | ICD-10-CM | POA: Diagnosis not present

## 2024-04-10 NOTE — Telephone Encounter (Addendum)
**Note De-Identified Kavya Haag Obfuscation** Letter received through the [facility] provider portal stating that they have approved this Split Night Sleep Study from 03/31/2024 to 07/07/2024. Authorization Number: [id number]   I have transferred the order to the sleep lab so they can contact the pt to schedule the test.  The pt is aware that her Split Night Sleep Study has been approved for coverage by [employer] and that I have transferred her order to the sleep lab. I did provide her with the Sleep Labs phone number so she can call them to schedule her test if she has not received a call from them within a week or two. She verbalized understanding and thanked me for calling her back.

## 2024-04-14 ENCOUNTER — Ambulatory Visit (INDEPENDENT_AMBULATORY_CARE_PROVIDER_SITE_OTHER): Payer: Medicaid Other | Admitting: Nurse Practitioner

## 2024-04-14 ENCOUNTER — Encounter: Payer: Self-pay | Admitting: Nurse Practitioner

## 2024-04-14 VITALS — BP 144/84 | HR 74 | Temp 97.8°F | Ht 66.0 in | Wt 199.6 lb

## 2024-04-14 DIAGNOSIS — Z794 Long term (current) use of insulin: Secondary | ICD-10-CM

## 2024-04-14 DIAGNOSIS — I10 Essential (primary) hypertension: Secondary | ICD-10-CM

## 2024-04-14 DIAGNOSIS — J301 Allergic rhinitis due to pollen: Secondary | ICD-10-CM | POA: Diagnosis not present

## 2024-04-14 DIAGNOSIS — K219 Gastro-esophageal reflux disease without esophagitis: Secondary | ICD-10-CM

## 2024-04-14 DIAGNOSIS — E1169 Type 2 diabetes mellitus with other specified complication: Secondary | ICD-10-CM

## 2024-04-14 DIAGNOSIS — E785 Hyperlipidemia, unspecified: Secondary | ICD-10-CM | POA: Diagnosis not present

## 2024-04-14 LAB — COMPREHENSIVE METABOLIC PANEL WITH GFR
ALT: 13 U/L (ref 0–35)
AST: 15 U/L (ref 0–37)
Albumin: 4.3 g/dL (ref 3.5–5.2)
Alkaline Phosphatase: 113 U/L (ref 39–117)
BUN: 16 mg/dL (ref 6–23)
CO2: 30 meq/L (ref 19–32)
Calcium: 9.7 mg/dL (ref 8.4–10.5)
Chloride: 104 meq/L (ref 96–112)
Creatinine, Ser: 0.76 mg/dL (ref 0.40–1.20)
GFR: 82.75 mL/min (ref >60.00)
Glucose, Bld: 153 mg/dL — ABNORMAL HIGH (ref 70–99)
Potassium: 4.1 meq/L (ref 3.5–5.1)
Sodium: 141 meq/L (ref 135–145)
Total Bilirubin: 0.4 mg/dL (ref 0.2–1.2)
Total Protein: 6.7 g/dL (ref 6.0–8.3)

## 2024-04-14 LAB — LDL CHOLESTEROL, DIRECT: Direct LDL: 108 mg/dL

## 2024-04-14 LAB — HEMOGLOBIN A1C: Hgb A1c MFr Bld: 7.4 % — ABNORMAL HIGH (ref 4.6–6.5)

## 2024-04-14 MED ORDER — AZELASTINE HCL 0.1 % NA SOLN
2.0000 | Freq: Two times a day (BID) | NASAL | 11 refills | Status: AC
Start: 2024-04-14 — End: ?

## 2024-04-14 MED ORDER — FLUTICASONE PROPIONATE 50 MCG/ACT NA SUSP
2.0000 | Freq: Every day | NASAL | 11 refills | Status: AC
Start: 2024-04-14 — End: ?

## 2024-04-14 NOTE — Progress Notes (Unsigned)
 Established Patient Visit  Patient: [name]   DOB: [birth year]   63 y.o. Female  MRN: [id number] Visit Date: 04/15/2024  Subjective:    Chief Complaint  Patient presents with   Follow-up    3 month f/u   HPI Seasonal allergic rhinitis due to pollen Refilled flonase  and azelastine   Essential hypertension Elevated BP possible due to acute chest wall pain post mammogram, no home BP reading. BP Readings from Last 3 Encounters:  04/14/24 (!) 144/84  03/06/24 (!) 140/88  01/16/24 132/70    Repeat CMP Advised to send home BP readings via mychart in 1weeks. F/up in 3months  DM (diabetes mellitus) (HCC) Under the care of Dr. Murtis Arthur (endocrinology) Improved and controlled with Current use of jardiance , tresiba , and metformin  Repeat lipid panel: LDL at goal with atorvastatin  UACr: normal Advised to schedule appt with ophthalmology Repeat hgbA1c-fprward to Dr, Murtis Arthur  Hyperlipidemia associated with type 2 diabetes mellitus (HCC) Repeat lipid panel Maintain atorvastatin  dose  BP Readings from Last 3 Encounters:  04/14/24 (!) 144/84  03/06/24 (!) 140/88  01/16/24 132/70     Reviewed medical, surgical, and social history today  Medications: Outpatient Medications Prior to Visit  Medication Sig   amLODipine  (NORVASC ) 5 MG tablet Take 1 tablet (5 mg total) by mouth every evening.   atorvastatin  (LIPITOR) 20 MG tablet Take 1 tablet (20 mg total) by mouth at bedtime.   BLACK COHOSH EXTRACT PO Take 1 tablet by mouth daily.   cholecalciferol (VITAMIN D3) 25 MCG (1000 UNIT) tablet Take 1,000 Units by mouth daily.   Continuous Glucose Sensor (DEXCOM G7 SENSOR) MISC 3 each by Does not apply route every 30 (thirty) days. Apply 1 sensor every 10 days   Continuous Glucose Sensor (FREESTYLE LIBRE 3 PLUS SENSOR) MISC Inject 1 Device into the skin continuous. Change every 15 days   Echinacea-Goldenseal (ECHINACEA COMB/GOLDEN SEAL PO) Take 1 capsule by mouth daily as  needed (Allergies).   empagliflozin  (JARDIANCE ) 25 MG TABS tablet Take 1 tablet (25 mg total) by mouth daily.   furosemide  (LASIX ) 20 MG tablet Take 1 tablet (20 mg total) by mouth daily as needed for edema.   insulin  glargine, 2 Unit Dial , (TOUJEO  MAX SOLOSTAR) 300 UNIT/ML Solostar Pen Inject 60 Units into the skin daily.   Insulin  Pen Needle (BD PEN NEEDLE NANO 2ND GEN) 32G X 4 MM MISC USE 4 TIMES A DAY   levocetirizine (XYZAL ) 5 MG tablet Take 1 tablet (5 mg total) by mouth every evening.   losartan  (COZAAR ) 50 MG tablet Take 1 tablet (50 mg total) by mouth in the morning and at bedtime.   meloxicam  (MOBIC ) 15 MG tablet Take 15 mg by mouth daily.   metFORMIN  (GLUCOPHAGE -XR) 500 MG 24 hr tablet Take 2 tablets (1,000 mg total) by mouth daily with breakfast.   nitroGLYCERIN  (NITRODUR - DOSED IN MG/24 HR) 0.2 mg/hr patch Place 0.2 mg onto the skin daily. Apply 1/4 patch daily as needed for tondonitis   omeprazole  (PRILOSEC) 20 MG capsule Take 1 capsule (20 mg total) by mouth daily.   Semaglutide , 1 MG/DOSE, (OZEMPIC , 1 MG/DOSE,) 4 MG/3ML SOPN Inject 1 mg into the skin once a week.   vitamin B-12 (CYANOCOBALAMIN ) 100 MCG tablet Take 100 mcg by mouth daily.   vitamin C  (ASCORBIC ACID ) 500 MG tablet Take 1,000 mg by mouth daily.   vitamin E  400 UNIT capsule Take 400 Units  by mouth daily. Reported on 02/13/2016   [DISCONTINUED] azelastine  (ASTELIN ) 0.1 % nasal spray Place 2 sprays into both nostrils 2 (two) times daily. Use in each nostril as directed   [DISCONTINUED] fluticasone  (FLONASE ) 50 MCG/ACT nasal spray Place 2 sprays into both nostrils daily.   [DISCONTINUED] methocarbamol  (ROBAXIN ) 750 MG tablet Take 750 mg by mouth 4 (four) times daily. (Patient not taking: Reported on 04/14/2024)   No facility-administered medications prior to visit.   Reviewed past medical and social history.   ROS per HPI above      Objective:  BP (!) 144/84 (BP Location: Left Arm, Patient Position: Sitting, Cuff  Size: Large)   Pulse 74   Temp 97.8 F (36.6 C) (Temporal)   Ht 5\' 6"  (1.676 m)   Wt 199 lb 9.6 oz (90.5 kg)   SpO2 99%   BMI 32.22 kg/m      Physical Exam Vitals reviewed.  Constitutional:      Appearance: She is obese.  Cardiovascular:     Rate and Rhythm: Normal rate and regular rhythm.     Pulses: Normal pulses.     Heart sounds: Normal heart sounds.  Pulmonary:     Effort: Pulmonary effort is normal.     Breath sounds: Normal breath sounds.  Chest:     Chest wall: Tenderness present.  Musculoskeletal:     Right lower leg: No edema.     Left lower leg: No edema.  Neurological:     Mental Status: She is alert and oriented to person, place, and time.     No results found for any visits on 04/14/24.    Assessment & Plan:    Problem List Items Addressed This Visit     DM (diabetes mellitus) (HCC)   Under the care of Dr. Murtis Arthur (endocrinology) Improved and controlled with Current use of jardiance , tresiba , and metformin  Repeat lipid panel: LDL at goal with atorvastatin  UACr: normal Advised to schedule appt with ophthalmology Repeat hgbA1c-fprward to Dr, Murtis Arthur      Relevant Orders   Hemoglobin A1c   Comprehensive metabolic panel with GFR   Microalbumin / creatinine urine ratio   Essential hypertension - Primary   Elevated BP possible due to acute chest wall pain post mammogram, no home BP reading. BP Readings from Last 3 Encounters:  04/14/24 (!) 144/84  03/06/24 (!) 140/88  01/16/24 132/70    Repeat CMP Advised to send home BP readings via mychart in 1weeks. F/up in 3months      Relevant Orders   Comprehensive metabolic panel with GFR   Hyperlipidemia associated with type 2 diabetes mellitus (HCC)   Repeat lipid panel Maintain atorvastatin  dose      Relevant Orders   Direct LDL   Seasonal allergic rhinitis due to pollen   Refilled flonase  and azelastine       Relevant Medications   azelastine  (ASTELIN ) 0.1 % nasal spray   fluticasone   (FLONASE ) 50 MCG/ACT nasal spray   Return in about 3 months (around 07/15/2024) for HTN, hyperlipidemia (fasting).     Kathrene Parents, NP

## 2024-04-14 NOTE — Patient Instructions (Signed)
 Send BP readings via mychart in 1week Maintain current med doses Go to lab

## 2024-04-15 ENCOUNTER — Encounter: Payer: Self-pay | Admitting: Nurse Practitioner

## 2024-04-15 ENCOUNTER — Telehealth: Payer: Self-pay | Admitting: Nurse Practitioner

## 2024-04-15 MED ORDER — LEVOCETIRIZINE DIHYDROCHLORIDE 5 MG PO TABS
5.0000 mg | ORAL_TABLET | Freq: Every evening | ORAL | 3 refills | Status: AC
Start: 2024-04-15 — End: ?

## 2024-04-15 MED ORDER — AMLODIPINE BESYLATE 5 MG PO TABS: 5.0000 mg | ORAL_TABLET | Freq: Every evening | ORAL | 1 refills | Status: DC

## 2024-04-15 MED ORDER — OMEPRAZOLE 20 MG PO CPDR: 20.0000 mg | DELAYED_RELEASE_CAPSULE | Freq: Every day | ORAL | 3 refills | Status: AC

## 2024-04-15 NOTE — Telephone Encounter (Signed)
 error

## 2024-04-15 NOTE — Assessment & Plan Note (Signed)
 Under the care of Dr. Murtis Arthur (endocrinology) Improved and controlled with Current use of jardiance , tresiba , and metformin  Repeat lipid panel: LDL at goal with atorvastatin  UACr: normal Advised to schedule appt with ophthalmology Repeat hgbA1c-fprward to Dr, Murtis Arthur

## 2024-04-15 NOTE — Assessment & Plan Note (Signed)
 Elevated BP possible due to acute chest wall pain post mammogram, no home BP reading. BP Readings from Last 3 Encounters:  04/14/24 (!) 144/84  03/06/24 (!) 140/88  01/16/24 132/70    Repeat CMP Advised to send home BP readings via mychart in 1weeks. F/up in 3months

## 2024-04-15 NOTE — Assessment & Plan Note (Signed)
Refilled flonase and azelastine

## 2024-04-15 NOTE — Assessment & Plan Note (Addendum)
 Repeat lipid panel LDL not at goal: increase atorvastatin  to 40mg . New PRESCRIPTION sent. Also needs to maintain a mediterranean diet and ALCOHOL use; to prevent development of heart disease and fatty liver.

## 2024-04-20 ENCOUNTER — Encounter: Payer: Self-pay | Admitting: Nurse Practitioner

## 2024-04-20 DIAGNOSIS — Z419 Encounter for procedure for purposes other than remedying health state, unspecified: Secondary | ICD-10-CM | POA: Diagnosis not present

## 2024-04-20 MED ORDER — ATORVASTATIN CALCIUM 40 MG PO TABS: 40.0000 mg | ORAL_TABLET | Freq: Every day | ORAL | 3 refills | Status: DC
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
## 2024-04-20 NOTE — Addendum Note (Signed)
 Addended by: Kathrene Parents L on: 04/20/2024 10:42 AM   Modules accepted: Orders

## 2024-04-21 ENCOUNTER — Ambulatory Visit: Payer: Self-pay | Admitting: Internal Medicine

## 2024-04-21 ENCOUNTER — Ambulatory Visit (INDEPENDENT_AMBULATORY_CARE_PROVIDER_SITE_OTHER): Payer: Medicaid Other | Admitting: Internal Medicine

## 2024-04-21 ENCOUNTER — Encounter: Payer: Self-pay | Admitting: Internal Medicine

## 2024-04-21 VITALS — BP 120/70 | HR 88 | Ht 66.0 in | Wt 201.8 lb

## 2024-04-21 DIAGNOSIS — Z7984 Long term (current) use of oral hypoglycemic drugs: Secondary | ICD-10-CM | POA: Diagnosis not present

## 2024-04-21 DIAGNOSIS — E785 Hyperlipidemia, unspecified: Secondary | ICD-10-CM | POA: Diagnosis not present

## 2024-04-21 DIAGNOSIS — E1159 Type 2 diabetes mellitus with other circulatory complications: Secondary | ICD-10-CM | POA: Diagnosis not present

## 2024-04-21 DIAGNOSIS — Z7985 Long-term (current) use of injectable non-insulin antidiabetic drugs: Secondary | ICD-10-CM

## 2024-04-21 DIAGNOSIS — Z794 Long term (current) use of insulin: Secondary | ICD-10-CM

## 2024-04-21 DIAGNOSIS — E1165 Type 2 diabetes mellitus with hyperglycemia: Secondary | ICD-10-CM

## 2024-04-21 DIAGNOSIS — E1169 Type 2 diabetes mellitus with other specified complication: Secondary | ICD-10-CM

## 2024-04-21 LAB — MICROALBUMIN / CREATININE URINE RATIO
Creatinine, Urine: 46 mg/dL (ref 20–275)
Microalb, Ur: <0.2 mg/dL

## 2024-04-21 MED ORDER — TOUJEO MAX SOLOSTAR 300 UNIT/ML ~~LOC~~ SOPN: 42.0000 [IU] | PEN_INJECTOR | Freq: Every day | SUBCUTANEOUS | Status: DC

## 2024-04-21 NOTE — Patient Instructions (Addendum)
 Please continue: - Metformin  ER 1000 mg with breakfast - Jardiance  25 mg before breakfast - Ozempic  1 mg weekly  Please reduce: - Toujeo  to 42 units daily  Please return for another visit in 4 months (before 08/14/2024).

## 2024-04-22 ENCOUNTER — Telehealth: Payer: Self-pay

## 2024-04-22 ENCOUNTER — Ambulatory Visit: Payer: Medicaid Other | Admitting: Cardiovascular Disease

## 2024-04-22 NOTE — Telephone Encounter (Signed)
 Called patient regarding canceling today's appointment patient needed to have split night completed before visit informed patient scheduling will be reaching out to get appointment rescheduled, patient verbalized understanding

## 2024-05-21 ENCOUNTER — Other Ambulatory Visit (HOSPITAL_COMMUNITY): Payer: Self-pay

## 2024-05-21 ENCOUNTER — Telehealth: Payer: Self-pay | Admitting: Pharmacy Technician

## 2024-05-21 DIAGNOSIS — Z419 Encounter for procedure for purposes other than remedying health state, unspecified: Secondary | ICD-10-CM | POA: Diagnosis not present

## 2024-05-21 NOTE — Telephone Encounter (Signed)
 Pharmacy Patient Advocate Encounter   Received notification from CoverMyMeds that prior authorization for Mounjaro  10MG /0.5ML auto-injectors is due for renewal.   Insurance verification completed.   The patient is insured through [facility] [place].  Action: Medication has been discontinued. Archived Key: Laurence Pons

## 2024-06-09 ENCOUNTER — Other Ambulatory Visit: Payer: Self-pay | Admitting: Nurse Practitioner

## 2024-06-09 ENCOUNTER — Other Ambulatory Visit: Payer: Self-pay | Admitting: Internal Medicine

## 2024-06-09 DIAGNOSIS — M5412 Radiculopathy, cervical region: Secondary | ICD-10-CM

## 2024-06-10 NOTE — Telephone Encounter (Signed)
 Medication: Cyclobenzaprine  (Flexeril ) 10 mg  Directions: Take 1 tablet by mouth PRN for muscle spasms  Discontinued on 09/17/23 by Roselie Mood, NP  Medication: Meloxicam  (Mobic ) 15 mg  Directions: Take 1 tablet by mouth daily  Last given: 12/01/23-Historical provider Number refills: N/A Last o/v: 04/14/24 Follow up: 3 months (around 07/15/24)-Scheduled for 10/15/24 Labs:

## 2024-06-20 DIAGNOSIS — Z419 Encounter for procedure for purposes other than remedying health state, unspecified: Secondary | ICD-10-CM | POA: Diagnosis not present

## 2024-06-28 ENCOUNTER — Ambulatory Visit (HOSPITAL_BASED_OUTPATIENT_CLINIC_OR_DEPARTMENT_OTHER): Attending: Cardiovascular Disease | Admitting: Cardiology

## 2024-06-28 DIAGNOSIS — G4733 Obstructive sleep apnea (adult) (pediatric): Secondary | ICD-10-CM | POA: Diagnosis not present

## 2024-06-28 DIAGNOSIS — R0902 Hypoxemia: Secondary | ICD-10-CM | POA: Diagnosis not present

## 2024-07-21 DIAGNOSIS — Z419 Encounter for procedure for purposes other than remedying health state, unspecified: Secondary | ICD-10-CM | POA: Diagnosis not present

## 2024-07-27 NOTE — Procedures (Signed)
 Indications for Polysomnography The patient is a 63 year-old Female who is 5' 6 and weighs 210.0 lbs. Her BMI equals 34.2.  A full night polysomnogram was performed to evaluate for Obstructive Sleep Apnea.  Medication-Metformin  Polysomnogram Data A full night polysomnogram recorded the standard physiologic parameters including EEG, EOG, EMG, EKG, nasal and oral airflow.  Respiratory parameters of chest and abdominal movements were recorded with Respiratory Inductance Plethysmography belts.   Oxygen saturation was recorded by pulse oximetry.  Sleep Architecture The total recording time of the polysomnogram was 437.9 minutes.  The total sleep time was 389.0 minutes.  The patient spent 1.8% of total sleep time in Stage N1, 85.0% in Stage N2, 0.0% in Stages N3, and 13.2% in REM.  Sleep latency was 7.7 minutes.   REM latency was 109.5 minutes.  Sleep Efficiency was 88.8%.  Wake after Sleep Onset time was 41.5 minutes.  Respiratory Events The polysomnogram revealed a presence of 0 obstructive, 0 central, and 0 mixed apneas resulting in an Apnea index of 0 events per hour.  There were 67 hypopneas (GreaterEqual to3% desaturation and/or arousal) resulting in an Apnea\Hypopnea Index (AHI  GreaterEqual to3% desaturation and/or arousal) of 10.3 events per hour.  There were 49 hypopneas (GreaterEqual to4% desaturation) resulting in an Apnea\Hypopnea Index (AHI GreaterEqual to4% desaturation) of 7.6 events per hour.  There were 0 Respiratory  Effort Related Arousals resulting in a RERA index of 0 events per hour. The Respiratory Disturbance Index is 10.3 events per hour.  The snore index was 0 events per hour.  Mean oxygen saturation was 90.3%.  The lowest oxygen saturation during sleep was 76.0%.  Time spent LessEqual to88% oxygen saturation was  minutes ().  Limb Activity There were 0 total limb movements recorded, of this total.  Cardiac Summary The average pulse rate was 96.6 bpm.  The minimum pulse  rate was 74.0 bpm while the maximum pulse rate was 119.0 bpm.  Cardiac rhythm was NSR with rare PACs and PVCs.  Diagnosis: Mild Obstructive Sleep Apnea overall but Moderate Obstructive Sleep Apnea during REM sleep. Nocturnal Hypoxemia  Recommendations: 1.  Clinical correlation of these findings is necessary.  The decision to treat obstructive sleep apnea (OSA) is usually based on the presence of apnea symptoms or the presence of associated medical conditions such as Hypertension, Congestive Heart  Failure, Atrial Fibrillation or Obesity.  The most common symptoms of OSA are snoring, gasping for breath while sleeping, daytime sleepiness and fatigue.  2.  Initiating apnea therapy is recommended given the presence of symptoms and/or associated conditions. Recommend proceeding with one of the following:  a.  Auto-CPAP therapy with a pressure range of 5-20cm H2O.  b.  An oral appliance (OA) that can be obtained from certain dentists with expertise in sleep medicine.  These are primarily of use in non-obese patients with mild and moderate disease.  c.  An ENT consultation which may be useful to look for specific causes of obstruction and possible treatment options.  d.  If patient is intolerant to PAP therapy, consider referral to ENT for evaluation for hypoglossal nerve stimulator.  3.  Close follow-up is necessary to ensure success with CPAP or oral appliance therapy for maximum benefit.  4.  A follow-up oximetry study on CPAP is recommended to assess the adequacy of therapy and determine the need for supplemental oxygen or the potential need for Bi-level therapy.  An arterial blood gas to determine the adequacy of baseline ventilation  and oxygenation should also  be considered.  5.  Healthy sleep recommendations include:  adequate nightly sleep (normal 7-9 hrs/night), avoidance of caffeine after noon and alcohol near bedtime, and maintaining a sleep environment that is cool, dark and  quiet.  6.  Weight loss for overweight patients is recommended.  Even modest amounts of weight loss can significantly improve the severity of sleep apnea.  7.  Snoring recommendations include:  weight loss where appropriate, side sleeping, and avoidance of alcohol before bed.  8.  Operation of motor vehicle should be avoided when sleepy.    This study was personally reviewed and electronically signed by: Wilbert Bihari, MD Accredited Board Certified in Sleep Medicine Date/Time: 07/27/2024 3:58PM

## 2024-07-28 ENCOUNTER — Ambulatory Visit: Payer: Self-pay | Admitting: *Deleted

## 2024-08-13 ENCOUNTER — Ambulatory Visit: Admitting: Internal Medicine
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
## 2024-08-17 ENCOUNTER — Ambulatory Visit (INDEPENDENT_AMBULATORY_CARE_PROVIDER_SITE_OTHER): Admitting: Internal Medicine

## 2024-08-17 ENCOUNTER — Encounter: Payer: Self-pay | Admitting: Internal Medicine

## 2024-08-17 VITALS — BP 120/70 | HR 93 | Ht 66.0 in | Wt 198.2 lb

## 2024-08-17 DIAGNOSIS — E1169 Type 2 diabetes mellitus with other specified complication: Secondary | ICD-10-CM | POA: Diagnosis not present

## 2024-08-17 DIAGNOSIS — Z7985 Long-term (current) use of injectable non-insulin antidiabetic drugs: Secondary | ICD-10-CM | POA: Diagnosis not present

## 2024-08-17 DIAGNOSIS — E785 Hyperlipidemia, unspecified: Secondary | ICD-10-CM | POA: Diagnosis not present

## 2024-08-17 DIAGNOSIS — E1165 Type 2 diabetes mellitus with hyperglycemia: Secondary | ICD-10-CM | POA: Diagnosis not present

## 2024-08-17 DIAGNOSIS — E1159 Type 2 diabetes mellitus with other circulatory complications: Secondary | ICD-10-CM | POA: Diagnosis not present

## 2024-08-17 DIAGNOSIS — Z794 Long term (current) use of insulin: Secondary | ICD-10-CM

## 2024-08-17 LAB — POCT GLYCOSYLATED HEMOGLOBIN (HGB A1C): Hemoglobin A1C: 9.5 % — AB (ref 4.0–5.6)
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
## 2024-08-17 NOTE — Patient Instructions (Addendum)
 Please continue: - Metformin  ER 1000 mg with breakfast - Jardiance  25 mg before breakfast - Novolog  8-10 units 3x a day before meals  Increase: - Ozempic  2 mg weekly - Toujeo  46 units daily  Please return for another visit in 3 months.

## 2024-08-21 ENCOUNTER — Encounter: Payer: Self-pay | Admitting: Cardiology

## 2024-08-21 ENCOUNTER — Telehealth: Payer: Self-pay | Admitting: *Deleted

## 2024-08-21 DIAGNOSIS — I10 Essential (primary) hypertension: Secondary | ICD-10-CM

## 2024-08-21 DIAGNOSIS — G4733 Obstructive sleep apnea (adult) (pediatric): Secondary | ICD-10-CM

## 2024-08-21 DIAGNOSIS — Z419 Encounter for procedure for purposes other than remedying health state, unspecified: Secondary | ICD-10-CM | POA: Diagnosis not present

## 2024-08-21 DIAGNOSIS — R5383 Other fatigue: Secondary | ICD-10-CM

## 2024-08-21 NOTE — Telephone Encounter (Signed)
-----   Message from Wilbert Bihari sent at 07/27/2024  3:59 PM EDT ----- Please let patient know that they have sleep apnea.  Recommend therapeutic CPAP titration for treatment of patient's sleep disordered breathing.

## 2024-08-21 NOTE — Telephone Encounter (Signed)
 The patient has been notified of the result and verbalized understanding.  All questions (if any) were answered. Joshua Dalton Seip, CMA 08/21/2024 3:02 PM     Will precert titration

## 2024-08-24 ENCOUNTER — Telehealth: Payer: Self-pay

## 2024-08-24 NOTE — Telephone Encounter (Signed)
**Note De-Identified Athen Riel Obfuscation** I started a CPAP Titration PA through the [facility] provider portal. Case [id number]

## 2024-09-07 ENCOUNTER — Other Ambulatory Visit (HOSPITAL_COMMUNITY): Payer: Self-pay

## 2024-09-07 ENCOUNTER — Telehealth: Payer: Self-pay

## 2024-09-07 NOTE — Telephone Encounter (Signed)
 Pharmacy Patient Advocate Encounter  Received notification from [facility] that Prior Authorization for FreeStyle Libre 3 Plus Sensor has been APPROVED from 09/07/24 to 03/06/25   PA #/Case ID/Reference #: [id number]

## 2024-09-07 NOTE — Telephone Encounter (Signed)
 Pharmacy Patient Advocate Encounter   Received notification from CoverMyMeds that prior authorization for FreeStyle Libre 3 Plus Sensor is required/requested.   Insurance verification completed.   The patient is insured through [facility] .   Per test claim: PA required; PA submitted to above mentioned insurance via Latent Key/confirmation #/EOC [id number] Status is pending

## 2024-09-14 NOTE — Telephone Encounter (Signed)
**Note De-Identified [name] Obfuscation** Letter received from the [facility] provider portal stating that they have approved the pts CPAP Titration from 08/24/2024 until 12/01/2024. Authorization Number: [id number]  I called the pt to advise her of this approval and to provide her with the sleep labs phone number so she can call to be scheduled but I got no answer so I left a message asking hervto call Macario back at [facility] at [phone].

## 2024-10-15 ENCOUNTER — Ambulatory Visit: Admitting: Nurse Practitioner

## 2024-10-15 ENCOUNTER — Ambulatory Visit (INDEPENDENT_AMBULATORY_CARE_PROVIDER_SITE_OTHER)

## 2024-10-15 ENCOUNTER — Encounter: Payer: Self-pay | Admitting: Nurse Practitioner

## 2024-10-15 ENCOUNTER — Ambulatory Visit: Payer: Self-pay | Admitting: Nurse Practitioner

## 2024-10-15 DIAGNOSIS — R0781 Pleurodynia: Secondary | ICD-10-CM | POA: Diagnosis not present

## 2024-10-15 DIAGNOSIS — G8929 Other chronic pain: Secondary | ICD-10-CM | POA: Diagnosis not present

## 2024-10-15 DIAGNOSIS — E1169 Type 2 diabetes mellitus with other specified complication: Secondary | ICD-10-CM | POA: Diagnosis not present

## 2024-10-15 DIAGNOSIS — Z6830 Body mass index (BMI) 30.0-30.9, adult: Secondary | ICD-10-CM | POA: Diagnosis not present

## 2024-10-15 DIAGNOSIS — R0789 Other chest pain: Secondary | ICD-10-CM | POA: Diagnosis not present

## 2024-10-15 DIAGNOSIS — E785 Hyperlipidemia, unspecified: Secondary | ICD-10-CM

## 2024-10-15 DIAGNOSIS — Z8781 Personal history of (healed) traumatic fracture: Secondary | ICD-10-CM

## 2024-10-15 DIAGNOSIS — I10 Essential (primary) hypertension: Secondary | ICD-10-CM

## 2024-10-15 DIAGNOSIS — Z780 Asymptomatic menopausal state: Secondary | ICD-10-CM

## 2024-10-15 LAB — CBC
HCT: 43 % (ref 36.0–46.0)
Hemoglobin: 14.2 g/dL (ref 12.0–15.0)
MCHC: 33 g/dL (ref 30.0–36.0)
MCV: 88.4 fl (ref 78.0–100.0)
Platelets: 299 K/uL (ref 150.0–400.0)
RBC: 4.87 Mil/uL (ref 3.87–5.11)
RDW: 13.8 % (ref 11.5–15.5)
WBC: 5.1 K/uL (ref 4.0–10.5)

## 2024-10-15 LAB — LIPID PANEL
Cholesterol: 246 mg/dL — ABNORMAL HIGH (ref 0–200)
HDL: 87 mg/dL (ref >39.00)
LDL Cholesterol: 139 mg/dL — ABNORMAL HIGH (ref 0–99)
NonHDL: 159.08
Total CHOL/HDL Ratio: 3
Triglycerides: 100 mg/dL (ref 0.0–149.0)
VLDL: 20 mg/dL (ref 0.0–40.0)

## 2024-10-15 LAB — COMPREHENSIVE METABOLIC PANEL WITH GFR
ALT: 18 U/L (ref 0–35)
AST: 14 U/L (ref 0–37)
Albumin: 4.5 g/dL (ref 3.5–5.2)
Alkaline Phosphatase: 137 U/L — ABNORMAL HIGH (ref 39–117)
BUN: 19 mg/dL (ref 6–23)
CO2: 27 meq/L (ref 19–32)
Calcium: 10.3 mg/dL (ref 8.4–10.5)
Chloride: 98 meq/L (ref 96–112)
Creatinine, Ser: 0.92 mg/dL (ref 0.40–1.20)
GFR: 65.56 mL/min (ref >60.00)
Glucose, Bld: 338 mg/dL — ABNORMAL HIGH (ref 70–99)
Potassium: 4 meq/L (ref 3.5–5.1)
Sodium: 133 meq/L — ABNORMAL LOW (ref 135–145)
Total Bilirubin: 0.5 mg/dL (ref 0.2–1.2)
Total Protein: 7.9 g/dL (ref 6.0–8.3)

## 2024-10-15 LAB — LIPASE: Lipase: 29 U/L (ref 11.0–59.0)

## 2024-10-15 MED ORDER — LOSARTAN POTASSIUM 50 MG PO TABS: 50.0000 mg | ORAL_TABLET | Freq: Two times a day (BID) | ORAL | 3 refills | Status: AC

## 2024-10-15 MED ORDER — AMLODIPINE BESYLATE 5 MG PO TABS: 5.0000 mg | ORAL_TABLET | Freq: Every evening | ORAL | 1 refills | Status: AC

## 2024-10-15 MED ORDER — MELOXICAM 15 MG PO TABS: 15.0000 mg | ORAL_TABLET | Freq: Every day | ORAL | 1 refills | Status: AC

## 2024-10-15 MED ORDER — CYCLOBENZAPRINE HCL 10 MG PO TABS: 10.0000 mg | ORAL_TABLET | Freq: Every evening | ORAL | 0 refills | Status: AC | PRN

## 2024-10-15 NOTE — Assessment & Plan Note (Signed)
 Advised about need for lifestyle modification: heart healthy diet and daily exercise.

## 2024-10-15 NOTE — Assessment & Plan Note (Signed)
 Today she reports intermittent bilateral lower chest wall pain with deep breathes, worse on right side. No nausea, no change in BM or urination, no rash, no SOB, no fever or night sweats.  Repeat CXR Resume mobic  15mg  daily for pain

## 2024-10-15 NOTE — Assessment & Plan Note (Signed)
 BP at goal today with amlodipine  and losartan  Home BP readings 120s/80s BP Readings from Last 3 Encounters:  10/15/24 120/80  08/17/24 120/70  04/21/24 120/70    Repeat CMP Advised to send home BP readings via mychart in [place]. F/up in 6months
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
## 2024-10-15 NOTE — Patient Instructions (Signed)
 Go to lab Maintain Heart healthy diet and daily exercise. Maintain current medications.

## 2024-10-15 NOTE — Assessment & Plan Note (Signed)
 Repeat lipid panel Maintain atorvastatin dose

## 2024-10-20 MED ORDER — ROSUVASTATIN CALCIUM 40 MG PO TABS: 40.0000 mg | ORAL_TABLET | Freq: Every evening | ORAL | 3 refills | Status: DC

## 2024-10-20 NOTE — Assessment & Plan Note (Signed)
 Stable renal and liver function. Elevated glucose due to uncontrolled DIABETES. Maintain appointment with endocrinology LDL not at goal with atorvastatin : switch med to crestor 40mg . New rx sent Normal CBC and lipase Schedule lab appointment to repeat lipid and hepatic panel in 3months (fasting)

## 2024-11-07 ENCOUNTER — Other Ambulatory Visit: Payer: Self-pay | Admitting: Internal Medicine

## 2024-11-07 DIAGNOSIS — E1165 Type 2 diabetes mellitus with hyperglycemia: Secondary | ICD-10-CM

## 2024-11-10 ENCOUNTER — Ambulatory Visit: Payer: Self-pay

## 2024-11-10 NOTE — Telephone Encounter (Signed)
 FYI Only or Action Required?: FYI only for provider: appointment scheduled on 12/3.  Patient was last seen in primary care on 10/15/2024 by [name], Sharon [place]ford, NP.  Called Nurse Triage reporting Leg Pain.  Symptoms began yesterday.  Interventions attempted: Flexeril  .  Symptoms are: gradually improving.  Triage Disposition: See PCP When Office is Open (Within 3 Days)  Patient/caregiver understands and will follow disposition?: Yes  Copied from CRM #[id number]. Topic: Clinical - Red Word Triage >> Nov 10, 2024  1:11 PM Thersia BROCKS wrote: [facility] that prompted transfer to Nurse Triage: Patient called in stated she is experiencing leg cramps in both legs, no swelling    ----------------------------------------------------------------------- From previous Reason for Contact - Scheduling: Patient/patient representative is calling to schedule an appointment. Refer to attachments for appointment information. Reason for Disposition  [1] MODERATE pain (e.g., interferes with normal activities, limping) AND [2] present > 3 days  Answer Assessment - Initial Assessment Questions Cramping both legs, off and on all night last night- Less during the day but still having it. Getting up to walk would help reduce cramping pain but as soon as she woke it would come back. Contemplated calling EMS due to pain. Recently prescribed muscle relaxer and rosuvastatin  (started a week ago). Hydrated. Denies fever, CP, SOB, dizziness, redness or swelling, or temp change. Appt with PCP 12/3 to discuss- advised to skip rosuvastatin  tonight to see if there is improvement as muscle pains can be a side effect of statins. May need alternative medication. ED/UC precautions understood   1. ONSET: When did the pain start?      Last night  2. LOCATION: Where is the pain located?      Both legs- thighs down to feet 3. PAIN: How bad is the pain?    (Scale 1-10; or mild, moderate, severe)     10/10 overnight, 3/10  today 4. WORK OR EXERCISE: Has there been any recent work or exercise that involved this part of the body?      denies 5. CAUSE: What do you think is causing the leg pain?     unsure 6. OTHER SYMPTOMS: Do you have any other symptoms? (e.g., chest pain, back pain, breathing difficulty, swelling, rash, fever, numbness, weakness)     Denies  Protocols used: Leg Pain-A-AH

## 2024-11-11 ENCOUNTER — Encounter: Payer: Self-pay | Admitting: Nurse Practitioner

## 2024-11-11 ENCOUNTER — Ambulatory Visit: Admitting: Nurse Practitioner

## 2024-11-11 VITALS — BP 124/76 | HR 93 | Ht 66.0 in | Wt 189.9 lb

## 2024-11-11 DIAGNOSIS — E1169 Type 2 diabetes mellitus with other specified complication: Secondary | ICD-10-CM

## 2024-11-11 DIAGNOSIS — M791 Myalgia, unspecified site: Secondary | ICD-10-CM

## 2024-11-11 DIAGNOSIS — E785 Hyperlipidemia, unspecified: Secondary | ICD-10-CM

## 2024-11-11 DIAGNOSIS — T466X5A Adverse effect of antihyperlipidemic and antiarteriosclerotic drugs, initial encounter: Secondary | ICD-10-CM

## 2024-11-11 NOTE — Progress Notes (Signed)
 Acute Office Visit  Subjective:    Patient ID: [name], female    DOB: March 05, 1961, 63 y.o.   MRN: [id number]  Chief Complaint  Patient presents with   Leg Pain    Pt is here today to be seen for bilateral leg pain. She reports that this started her Rosuvastatin  this past Monday. It is causing her severe leg pain and cramping.    Leg Pain    Hyperlipidemia associated with type 2 diabetes mellitus (HCC) Developed severe LE muscle cramps and pain after 1 dose of crestor  40mg . No adverse effect with previous use of atorvastatin  40mg   Hold all cholesterol med x 2weeks Maintain adequate oral hydration Resume atorvastatin  40mg  in 2weeks: 1tab 2x/week x 1week, then 1tab 3x/week x 1week, then 1tabs daily in PM. She is to Call office if myalgia returns with use of atorvastatin    Outpatient Medications Prior to Visit  Medication Sig Note   amLODipine  (NORVASC ) 5 MG tablet Take 1 tablet (5 mg total) by mouth every evening.    atorvastatin  (LIPITOR) 40 MG tablet Take 40 mg by mouth daily.    azelastine  (ASTELIN ) 0.1 % nasal spray Place 2 sprays into both nostrils 2 (two) times daily. Use in each nostril as directed    BLACK COHOSH EXTRACT PO Take 1 tablet by mouth daily.    cholecalciferol (VITAMIN D3) 25 MCG (1000 UNIT) tablet Take 1,000 Units by mouth daily.    Continuous Glucose Sensor (FREESTYLE LIBRE 3 PLUS SENSOR) MISC Inject 1 Device into the skin continuous. Change every 15 days    cyclobenzaprine  (FLEXERIL ) 10 MG tablet Take 1 tablet (10 mg total) by mouth at bedtime as needed for muscle spasms.    Echinacea-Goldenseal (ECHINACEA COMB/GOLDEN SEAL PO) Take 1 capsule by mouth daily as needed (Allergies).    fluticasone  (FLONASE ) 50 MCG/ACT nasal spray Place 2 sprays into both nostrils daily.    furosemide  (LASIX ) 20 MG tablet Take 1 tablet (20 mg total) by mouth daily as needed for edema.    insulin  glargine, 2 Unit Dial , (TOUJEO  MAX SOLOSTAR) 300 UNIT/ML Solostar Pen Inject 42  Units into the skin daily.    Insulin  Pen Needle (BD PEN NEEDLE NANO 2ND GEN) 32G X 4 MM MISC USE 4 TIMES A DAY    JARDIANCE  25 MG TABS tablet TAKE 1 TABLET (25 MG TOTAL) BY MOUTH DAILY.    levocetirizine (XYZAL ) 5 MG tablet Take 1 tablet (5 mg total) by mouth every evening.    losartan  (COZAAR ) 50 MG tablet Take 1 tablet (50 mg total) by mouth in the morning and at bedtime.    meloxicam  (MOBIC ) 15 MG tablet Take 1 tablet (15 mg total) by mouth daily. With food    metFORMIN  (GLUCOPHAGE -XR) 500 MG 24 hr tablet TAKE 2 TABLETS BY MOUTH EVERY DAY WITH BREAKFAST    nitroGLYCERIN  (NITRODUR - DOSED IN MG/24 HR) 0.2 mg/hr patch Place 0.2 mg onto the skin daily. Apply 1/4 patch daily as needed for tondonitis    omeprazole  (PRILOSEC) 20 MG capsule Take 1 capsule (20 mg total) by mouth daily.    Semaglutide , 1 MG/DOSE, (OZEMPIC , 1 MG/DOSE,) 4 MG/3ML SOPN INJECT 1MG  INTO THE SKIN ONCE A WEEK    vitamin B-12 (CYANOCOBALAMIN ) 100 MCG tablet Take 100 mcg by mouth daily.    vitamin C  (ASCORBIC ACID ) 500 MG tablet Take 1,000 mg by mouth daily.    vitamin E  400 UNIT capsule Take 400 Units by mouth daily. Reported on 02/13/2016    [  DISCONTINUED] rosuvastatin  (CRESTOR ) 40 MG tablet Take 1 tablet (40 mg total) by mouth every evening. (Patient not taking: Reported on 11/11/2024) 11/11/2024: myalgia   No facility-administered medications prior to visit.    Reviewed past medical and social history.  Review of Systems  Constitutional:  Negative for activity change, appetite change and unexpected weight change.  Respiratory: Negative.    Cardiovascular: Negative.   Gastrointestinal: Negative.   Endocrine: Negative for cold intolerance and heat intolerance.  Genitourinary: Negative.   Musculoskeletal: Negative.   Skin: Negative.   Neurological: Negative.   Hematological: Negative.   Psychiatric/Behavioral:  Negative for behavioral problems, decreased concentration, dysphoric mood, hallucinations, self-injury, sleep  disturbance and suicidal ideas. The patient is not nervous/anxious.    Per HPI     Objective:    Physical Exam Vitals and nursing note reviewed.  Cardiovascular:     Rate and Rhythm: Normal rate.     Pulses: Normal pulses.  Pulmonary:     Effort: Pulmonary effort is normal.  Musculoskeletal:     Right lower leg: No edema.     Left lower leg: No edema.  Neurological:     Mental Status: She is alert and oriented to person, place, and time.     BP 124/76 (BP Location: Right Arm, Cuff Size: Normal)   Pulse 93   Ht 5' 6 (1.676 m)   Wt 189 lb 14.4 oz (86.1 kg)   SpO2 98%   BMI 30.65 kg/m    No results found for any visits on 11/11/24.     Assessment & Plan:   Problem List Items Addressed This Visit   None Visit Diagnoses       Myalgia due to statin    -  Primary      No orders of the defined types were placed in this encounter.  Return if symptoms worsen or fail to improve.    Roselie Mood, NP

## 2024-11-11 NOTE — Patient Instructions (Signed)
 Hold all cholesterol meds x 2weeks. Maintain adequate oral hydration Resume atorvastatin  40mg  in 2weeks: 1tab 2x/week x 1week, then 1tab 3x/week x 1week, then 1tabs daily in PM. Call office if myalgia returns with use of atorvastatin 

## 2024-11-11 NOTE — Telephone Encounter (Signed)
 Pt was seen in office today by Roselie Katheen PIETY

## 2024-11-11 NOTE — Assessment & Plan Note (Signed)
 Developed severe LE muscle cramps and pain after 1 dose of crestor  40mg . No adverse effect with previous use of atorvastatin  40mg   Hold all cholesterol med x 2weeks Maintain adequate oral hydration Resume atorvastatin  40mg  in 2weeks: 1tab 2x/week x 1week, then 1tab 3x/week x 1week, then 1tabs daily in PM. She is to Call office if myalgia returns with use of atorvastatin 

## 2024-11-19 ENCOUNTER — Ambulatory Visit: Admitting: Internal Medicine

## 2024-11-30 ENCOUNTER — Ambulatory Visit: Admitting: Internal Medicine

## 2024-11-30 ENCOUNTER — Encounter: Payer: Self-pay | Admitting: Internal Medicine

## 2024-11-30 VITALS — BP 120/70 | HR 88 | Ht 66.0 in | Wt 189.4 lb

## 2024-11-30 DIAGNOSIS — Z7984 Long term (current) use of oral hypoglycemic drugs: Secondary | ICD-10-CM | POA: Diagnosis not present

## 2024-11-30 DIAGNOSIS — Z794 Long term (current) use of insulin: Secondary | ICD-10-CM

## 2024-11-30 DIAGNOSIS — E1169 Type 2 diabetes mellitus with other specified complication: Secondary | ICD-10-CM | POA: Diagnosis not present

## 2024-11-30 DIAGNOSIS — E1165 Type 2 diabetes mellitus with hyperglycemia: Secondary | ICD-10-CM | POA: Diagnosis not present

## 2024-11-30 DIAGNOSIS — E1140 Type 2 diabetes mellitus with diabetic neuropathy, unspecified: Secondary | ICD-10-CM | POA: Diagnosis not present

## 2024-11-30 DIAGNOSIS — E1159 Type 2 diabetes mellitus with other circulatory complications: Secondary | ICD-10-CM | POA: Diagnosis not present

## 2024-11-30 DIAGNOSIS — Z7985 Long-term (current) use of injectable non-insulin antidiabetic drugs: Secondary | ICD-10-CM

## 2024-11-30 DIAGNOSIS — E785 Hyperlipidemia, unspecified: Secondary | ICD-10-CM | POA: Diagnosis not present

## 2024-11-30 LAB — POCT GLYCOSYLATED HEMOGLOBIN (HGB A1C): Hemoglobin A1C: 13.1 % — AB (ref 4.0–5.6)

## 2024-11-30 MED ORDER — TRESIBA FLEXTOUCH 200 UNIT/ML ~~LOC~~ SOPN: 46.0000 [IU] | PEN_INJECTOR | Freq: Every day | SUBCUTANEOUS | 3 refills | Status: DC

## 2024-11-30 MED ORDER — NOVOLOG FLEXPEN 100 UNIT/ML ~~LOC~~ SOPN: 8.0000 [IU] | PEN_INJECTOR | Freq: Three times a day (TID) | SUBCUTANEOUS | 3 refills | Status: AC

## 2024-11-30 MED ORDER — TIRZEPATIDE 5 MG/0.5ML ~~LOC~~ SOAJ: 5.0000 mg | SUBCUTANEOUS | 3 refills | Status: DC
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
## 2024-11-30 NOTE — Patient Instructions (Addendum)
 Please continue: - Metformin  ER 1000 mg with breakfast  Change Toujeo  to: - Tresiba  46 units daily  Start: - Novolog  8-10 units 3x a day before meals - Mounjaro  5 mg weekly  Please return for another visit in 1.5 months.

## 2024-12-04 ENCOUNTER — Other Ambulatory Visit: Payer: Self-pay | Admitting: Internal Medicine

## 2024-12-04 DIAGNOSIS — E1159 Type 2 diabetes mellitus with other circulatory complications: Secondary | ICD-10-CM

## 2024-12-08 ENCOUNTER — Other Ambulatory Visit: Payer: Self-pay

## 2024-12-08 MED ORDER — TRESIBA FLEXTOUCH 200 UNIT/ML ~~LOC~~ SOPN: 46.0000 [IU] | PEN_INJECTOR | Freq: Every day | SUBCUTANEOUS | 3 refills | Status: AC

## 2024-12-14 ENCOUNTER — Other Ambulatory Visit: Payer: Self-pay | Admitting: Internal Medicine

## 2024-12-15 ENCOUNTER — Telehealth: Payer: Self-pay

## 2024-12-15 ENCOUNTER — Other Ambulatory Visit (HOSPITAL_COMMUNITY): Payer: Self-pay

## 2024-12-15 NOTE — Telephone Encounter (Signed)
 Pharmacy Patient Advocate Encounter   Received notification from Pt Calls Messages that prior authorization for Mounjaro  5MG /0.5ML auto-injectors is required/requested.   Insurance verification completed.   The patient is insured through [employer].   Per test claim: PA required; PA submitted to above mentioned insurance via Latent Key/confirmation #/[id number] Status is pending

## 2024-12-16 ENCOUNTER — Other Ambulatory Visit: Payer: Self-pay

## 2024-12-16 ENCOUNTER — Other Ambulatory Visit (HOSPITAL_COMMUNITY): Payer: Self-pay

## 2024-12-16 MED ORDER — MOUNJARO 5 MG/0.5ML ~~LOC~~ SOAJ: 5.0000 mg | SUBCUTANEOUS | 3 refills | Status: AC

## 2024-12-16 NOTE — Telephone Encounter (Signed)
 Pharmacy Patient Advocate Encounter  Received notification from [employer] that Prior Authorization for Mounjaro  5MG /0.5ML auto-injectors  has been APPROVED from 12/10/24 to 12/09/25. Ran test claim, Copay is $92.68. This test claim was processed through [facility]- copay amounts may vary at other pharmacies due to pharmacy/plan contracts, or as the patient moves through the different stages of their insurance plan.   PA #/Case ID/Reference #: [id number]

## 2025-01-19 ENCOUNTER — Ambulatory Visit: Admitting: Nurse Practitioner

## 2025-01-19 ENCOUNTER — Ambulatory Visit: Admitting: Internal Medicine

## 2025-01-20 ENCOUNTER — Other Ambulatory Visit

## 2025-01-29 ENCOUNTER — Ambulatory Visit: Admitting: Internal Medicine

## 2025-04-14 ENCOUNTER — Ambulatory Visit: Admitting: Nurse Practitioner
